# Patient Record
Sex: Female | Born: 1935 | Race: White | Hispanic: No | Smoking: Never smoker
Health system: Southern US, Community
[De-identification: ages and names within clinical notes are randomized; demographics above are authoritative.]

## PROBLEM LIST (undated history)

## (undated) DIAGNOSIS — M1990 Unspecified osteoarthritis, unspecified site: Secondary | ICD-10-CM

## (undated) DIAGNOSIS — Z9289 Personal history of other medical treatment: Secondary | ICD-10-CM

## (undated) DIAGNOSIS — K5730 Diverticulosis of large intestine without perforation or abscess without bleeding: Secondary | ICD-10-CM

## (undated) DIAGNOSIS — I219 Acute myocardial infarction, unspecified: Secondary | ICD-10-CM

## (undated) DIAGNOSIS — R42 Dizziness and giddiness: Secondary | ICD-10-CM

## (undated) DIAGNOSIS — J309 Allergic rhinitis, unspecified: Secondary | ICD-10-CM

## (undated) DIAGNOSIS — I2510 Atherosclerotic heart disease of native coronary artery without angina pectoris: Secondary | ICD-10-CM

## (undated) DIAGNOSIS — M899 Disorder of bone, unspecified: Secondary | ICD-10-CM

## (undated) DIAGNOSIS — M949 Disorder of cartilage, unspecified: Secondary | ICD-10-CM

## (undated) DIAGNOSIS — K219 Gastro-esophageal reflux disease without esophagitis: Secondary | ICD-10-CM

## (undated) DIAGNOSIS — N952 Postmenopausal atrophic vaginitis: Secondary | ICD-10-CM

## (undated) HISTORY — DX: Dizziness and giddiness: R42

## (undated) HISTORY — PX: HEMORRHOID SURGERY: SHX153

## (undated) HISTORY — DX: Allergic rhinitis, unspecified: J30.9

## (undated) HISTORY — DX: Disorder of cartilage, unspecified: M94.9

## (undated) HISTORY — PX: EYE SURGERY: SHX253

## (undated) HISTORY — DX: Postmenopausal atrophic vaginitis: N95.2

## (undated) HISTORY — DX: Disorder of bone, unspecified: M89.9

---

## 1969-10-20 DIAGNOSIS — Z9289 Personal history of other medical treatment: Secondary | ICD-10-CM

## 1969-10-20 HISTORY — DX: Personal history of other medical treatment: Z92.89

## 1969-10-20 HISTORY — PX: APPENDECTOMY: SHX54

## 1969-10-20 HISTORY — PX: TOTAL ABDOMINAL HYSTERECTOMY: SHX209

## 1998-10-16 ENCOUNTER — Encounter: Payer: Self-pay | Admitting: Radiology

## 1998-10-16 ENCOUNTER — Ambulatory Visit (HOSPITAL_COMMUNITY): Admission: RE | Admit: 1998-10-16 | Discharge: 1998-10-16 | Payer: Self-pay | Admitting: Ophthalmology

## 2000-01-28 ENCOUNTER — Encounter: Payer: Self-pay | Admitting: Internal Medicine

## 2000-01-28 ENCOUNTER — Encounter: Admission: RE | Admit: 2000-01-28 | Discharge: 2000-01-28 | Payer: Self-pay | Admitting: Internal Medicine

## 2001-02-03 ENCOUNTER — Encounter: Admission: RE | Admit: 2001-02-03 | Discharge: 2001-02-03 | Payer: Self-pay | Admitting: Internal Medicine

## 2001-02-03 ENCOUNTER — Encounter: Payer: Self-pay | Admitting: Internal Medicine

## 2001-10-20 DIAGNOSIS — K5730 Diverticulosis of large intestine without perforation or abscess without bleeding: Secondary | ICD-10-CM

## 2001-10-20 HISTORY — DX: Diverticulosis of large intestine without perforation or abscess without bleeding: K57.30

## 2002-02-08 ENCOUNTER — Encounter: Admission: RE | Admit: 2002-02-08 | Discharge: 2002-02-08 | Payer: Self-pay | Admitting: Internal Medicine

## 2002-02-08 ENCOUNTER — Encounter: Payer: Self-pay | Admitting: Internal Medicine

## 2002-07-08 ENCOUNTER — Encounter: Payer: Self-pay | Admitting: Internal Medicine

## 2003-02-21 ENCOUNTER — Encounter: Payer: Self-pay | Admitting: Internal Medicine

## 2003-02-21 ENCOUNTER — Encounter: Admission: RE | Admit: 2003-02-21 | Discharge: 2003-02-21 | Payer: Self-pay | Admitting: Internal Medicine

## 2003-03-06 ENCOUNTER — Encounter: Payer: Self-pay | Admitting: Internal Medicine

## 2003-03-06 ENCOUNTER — Encounter: Admission: RE | Admit: 2003-03-06 | Discharge: 2003-03-06 | Payer: Self-pay | Admitting: Internal Medicine

## 2004-02-23 ENCOUNTER — Ambulatory Visit (HOSPITAL_COMMUNITY): Admission: RE | Admit: 2004-02-23 | Discharge: 2004-02-23 | Payer: Self-pay | Admitting: Internal Medicine

## 2004-05-07 ENCOUNTER — Encounter: Admission: RE | Admit: 2004-05-07 | Discharge: 2004-05-07 | Payer: Self-pay | Admitting: Internal Medicine

## 2004-05-28 ENCOUNTER — Other Ambulatory Visit: Admission: RE | Admit: 2004-05-28 | Discharge: 2004-05-28 | Payer: Self-pay | Admitting: Obstetrics and Gynecology

## 2004-06-21 ENCOUNTER — Ambulatory Visit (HOSPITAL_COMMUNITY): Admission: RE | Admit: 2004-06-21 | Discharge: 2004-06-21 | Payer: Self-pay | Admitting: Obstetrics and Gynecology

## 2004-08-27 ENCOUNTER — Ambulatory Visit: Payer: Self-pay | Admitting: Internal Medicine

## 2004-11-06 ENCOUNTER — Ambulatory Visit: Payer: Self-pay | Admitting: Internal Medicine

## 2005-02-24 ENCOUNTER — Ambulatory Visit (HOSPITAL_COMMUNITY): Admission: RE | Admit: 2005-02-24 | Discharge: 2005-02-24 | Payer: Self-pay | Admitting: Internal Medicine

## 2005-08-21 ENCOUNTER — Ambulatory Visit: Payer: Self-pay | Admitting: Internal Medicine

## 2005-11-17 ENCOUNTER — Ambulatory Visit: Payer: Self-pay | Admitting: Internal Medicine

## 2005-11-19 ENCOUNTER — Ambulatory Visit: Payer: Self-pay | Admitting: Internal Medicine

## 2006-03-02 ENCOUNTER — Ambulatory Visit: Payer: Self-pay | Admitting: Internal Medicine

## 2006-04-01 ENCOUNTER — Ambulatory Visit (HOSPITAL_COMMUNITY): Admission: RE | Admit: 2006-04-01 | Discharge: 2006-04-01 | Payer: Self-pay | Admitting: Internal Medicine

## 2006-07-03 ENCOUNTER — Ambulatory Visit: Payer: Self-pay | Admitting: Internal Medicine

## 2006-08-12 ENCOUNTER — Ambulatory Visit: Payer: Self-pay | Admitting: Internal Medicine

## 2006-11-18 ENCOUNTER — Ambulatory Visit: Payer: Self-pay | Admitting: Internal Medicine

## 2007-03-03 ENCOUNTER — Ambulatory Visit: Payer: Self-pay | Admitting: Internal Medicine

## 2007-03-04 LAB — CONVERTED CEMR LAB
ALT: 16 units/L (ref 0–40)
AST: 21 units/L (ref 0–37)
Albumin: 3.9 g/dL (ref 3.5–5.2)
Alkaline Phosphatase: 56 units/L (ref 39–117)
Amylase: 79 units/L (ref 27–131)
BUN: 14 mg/dL (ref 6–23)
Basophils Absolute: 0.1 10*3/uL (ref 0.0–0.1)
Basophils Relative: 1 % (ref 0.0–1.0)
Bilirubin, Direct: 0.1 mg/dL (ref 0.0–0.3)
CO2: 28 meq/L (ref 19–32)
Calcium: 9.3 mg/dL (ref 8.4–10.5)
Chloride: 103 meq/L (ref 96–112)
Creatinine, Ser: 0.9 mg/dL (ref 0.4–1.2)
Eosinophils Absolute: 0.3 10*3/uL (ref 0.0–0.6)
Eosinophils Relative: 3.8 % (ref 0.0–5.0)
GFR calc Af Amer: 79 mL/min
GFR calc non Af Amer: 65 mL/min
Glucose, Bld: 98 mg/dL (ref 70–99)
HCT: 41.8 % (ref 36.0–46.0)
Hemoglobin: 14.1 g/dL (ref 12.0–15.0)
Lymphocytes Relative: 14.8 % (ref 12.0–46.0)
MCHC: 33.7 g/dL (ref 30.0–36.0)
MCV: 95.8 fL (ref 78.0–100.0)
Monocytes Absolute: 0.8 10*3/uL — ABNORMAL HIGH (ref 0.2–0.7)
Monocytes Relative: 10.2 % (ref 3.0–11.0)
Neutro Abs: 5.4 10*3/uL (ref 1.4–7.7)
Neutrophils Relative %: 70.2 % (ref 43.0–77.0)
Platelets: 246 10*3/uL (ref 150–400)
Potassium: 4.2 meq/L (ref 3.5–5.1)
RBC: 4.36 M/uL (ref 3.87–5.11)
RDW: 12.6 % (ref 11.5–14.6)
Sodium: 138 meq/L (ref 135–145)
Total Bilirubin: 1.1 mg/dL (ref 0.3–1.2)
Total Protein: 6.9 g/dL (ref 6.0–8.3)
WBC: 7.8 10*3/uL (ref 4.5–10.5)

## 2007-04-07 ENCOUNTER — Encounter: Payer: Self-pay | Admitting: Internal Medicine

## 2007-04-07 ENCOUNTER — Ambulatory Visit (HOSPITAL_COMMUNITY): Admission: RE | Admit: 2007-04-07 | Discharge: 2007-04-07 | Payer: Self-pay | Admitting: Internal Medicine

## 2007-06-02 DIAGNOSIS — M949 Disorder of cartilage, unspecified: Secondary | ICD-10-CM

## 2007-06-02 DIAGNOSIS — M899 Disorder of bone, unspecified: Secondary | ICD-10-CM

## 2007-06-02 DIAGNOSIS — J309 Allergic rhinitis, unspecified: Secondary | ICD-10-CM

## 2007-06-02 HISTORY — DX: Allergic rhinitis, unspecified: J30.9

## 2007-06-02 HISTORY — DX: Disorder of bone, unspecified: M89.9

## 2007-06-28 ENCOUNTER — Ambulatory Visit: Payer: Self-pay | Admitting: Gastroenterology

## 2007-07-12 ENCOUNTER — Encounter: Payer: Self-pay | Admitting: Internal Medicine

## 2007-07-12 ENCOUNTER — Ambulatory Visit: Payer: Self-pay | Admitting: Gastroenterology

## 2007-08-03 ENCOUNTER — Ambulatory Visit: Payer: Self-pay | Admitting: Internal Medicine

## 2007-08-23 ENCOUNTER — Ambulatory Visit: Payer: Self-pay | Admitting: Internal Medicine

## 2007-08-23 DIAGNOSIS — J069 Acute upper respiratory infection, unspecified: Secondary | ICD-10-CM | POA: Insufficient documentation

## 2007-11-25 ENCOUNTER — Ambulatory Visit: Payer: Self-pay | Admitting: Internal Medicine

## 2007-11-25 LAB — CONVERTED CEMR LAB
Alkaline Phosphatase: 50 units/L (ref 39–117)
CO2: 31 meq/L (ref 19–32)
Calcium: 9.6 mg/dL (ref 8.4–10.5)
Cholesterol: 236 mg/dL (ref 0–200)
Direct LDL: 139.2 mg/dL
Eosinophils Absolute: 0.2 10*3/uL (ref 0.0–0.6)
GFR calc non Af Amer: 65 mL/min
Glucose, Bld: 88 mg/dL (ref 70–99)
HCT: 42 % (ref 36.0–46.0)
Hemoglobin: 14.7 g/dL (ref 12.0–15.0)
Lymphocytes Relative: 34.9 % (ref 12.0–46.0)
MCHC: 34.9 g/dL (ref 30.0–36.0)
MCV: 94.4 fL (ref 78.0–100.0)
Monocytes Absolute: 0.7 10*3/uL (ref 0.2–0.7)
Monocytes Relative: 14 % — ABNORMAL HIGH (ref 3.0–11.0)
Neutro Abs: 2.4 10*3/uL (ref 1.4–7.7)
Platelets: 238 10*3/uL (ref 150–400)
Potassium: 4.8 meq/L (ref 3.5–5.1)
RBC: 4.45 M/uL (ref 3.87–5.11)
RDW: 12.8 % (ref 11.5–14.6)
TSH: 1.44 microintl units/mL (ref 0.35–5.50)
Total Bilirubin: 1.5 mg/dL — ABNORMAL HIGH (ref 0.3–1.2)
Total Protein: 6.4 g/dL (ref 6.0–8.3)
WBC: 5 10*3/uL (ref 4.5–10.5)

## 2007-11-26 ENCOUNTER — Telehealth: Payer: Self-pay | Admitting: Internal Medicine

## 2008-01-24 ENCOUNTER — Ambulatory Visit: Payer: Self-pay | Admitting: Internal Medicine

## 2008-01-24 DIAGNOSIS — N390 Urinary tract infection, site not specified: Secondary | ICD-10-CM | POA: Insufficient documentation

## 2008-01-24 LAB — CONVERTED CEMR LAB
Bilirubin Urine: NEGATIVE
Glucose, Urine, Semiquant: NEGATIVE

## 2008-03-08 ENCOUNTER — Ambulatory Visit: Payer: Self-pay | Admitting: Internal Medicine

## 2008-04-07 ENCOUNTER — Ambulatory Visit (HOSPITAL_COMMUNITY): Admission: RE | Admit: 2008-04-07 | Discharge: 2008-04-07 | Payer: Self-pay | Admitting: Obstetrics and Gynecology

## 2008-05-08 ENCOUNTER — Encounter: Admission: RE | Admit: 2008-05-08 | Discharge: 2008-05-08 | Payer: Self-pay | Admitting: Obstetrics and Gynecology

## 2008-07-20 ENCOUNTER — Ambulatory Visit: Payer: Self-pay | Admitting: Internal Medicine

## 2008-07-25 ENCOUNTER — Ambulatory Visit: Payer: Self-pay | Admitting: Internal Medicine

## 2008-09-04 ENCOUNTER — Emergency Department (HOSPITAL_COMMUNITY): Admission: EM | Admit: 2008-09-04 | Discharge: 2008-09-04 | Payer: Self-pay | Admitting: Emergency Medicine

## 2008-09-28 ENCOUNTER — Emergency Department (HOSPITAL_COMMUNITY): Admission: EM | Admit: 2008-09-28 | Discharge: 2008-09-29 | Payer: Self-pay | Admitting: Emergency Medicine

## 2008-09-29 ENCOUNTER — Telehealth: Payer: Self-pay | Admitting: Internal Medicine

## 2008-10-16 ENCOUNTER — Telehealth: Payer: Self-pay | Admitting: Internal Medicine

## 2008-12-05 ENCOUNTER — Encounter: Payer: Self-pay | Admitting: Internal Medicine

## 2008-12-06 ENCOUNTER — Ambulatory Visit: Payer: Self-pay | Admitting: Internal Medicine

## 2008-12-06 DIAGNOSIS — N952 Postmenopausal atrophic vaginitis: Secondary | ICD-10-CM | POA: Insufficient documentation

## 2008-12-06 HISTORY — DX: Postmenopausal atrophic vaginitis: N95.2

## 2008-12-06 LAB — CONVERTED CEMR LAB
Basophils Absolute: 0 10*3/uL (ref 0.0–0.1)
Basophils Relative: 0.1 % (ref 0.0–3.0)
Bilirubin, Direct: 0.2 mg/dL (ref 0.0–0.3)
Cholesterol: 260 mg/dL (ref 0–200)
Eosinophils Absolute: 0.2 10*3/uL (ref 0.0–0.7)
Eosinophils Relative: 3.4 % (ref 0.0–5.0)
GFR calc Af Amer: 90 mL/min
HDL: 69.2 mg/dL (ref >39.0)
MCHC: 34.6 g/dL (ref 30.0–36.0)
MCV: 95 fL (ref 78.0–100.0)
Monocytes Absolute: 0.5 10*3/uL (ref 0.1–1.0)
Monocytes Relative: 10.4 % (ref 3.0–12.0)
Neutro Abs: 2.5 10*3/uL (ref 1.4–7.7)
Neutrophils Relative %: 49.2 % (ref 43.0–77.0)
RBC: 4.33 M/uL (ref 3.87–5.11)
RDW: 12.7 % (ref 11.5–14.6)
Total Bilirubin: 1.7 mg/dL — ABNORMAL HIGH (ref 0.3–1.2)
Total CHOL/HDL Ratio: 3.8
Triglycerides: 142 mg/dL (ref 0–149)
VLDL: 28 mg/dL (ref 0–40)
WBC: 5 10*3/uL (ref 4.5–10.5)

## 2009-02-15 ENCOUNTER — Telehealth: Payer: Self-pay | Admitting: Internal Medicine

## 2009-02-16 ENCOUNTER — Ambulatory Visit: Payer: Self-pay | Admitting: Family Medicine

## 2009-02-16 DIAGNOSIS — R42 Dizziness and giddiness: Secondary | ICD-10-CM | POA: Insufficient documentation

## 2009-02-16 HISTORY — DX: Dizziness and giddiness: R42

## 2009-05-09 ENCOUNTER — Ambulatory Visit (HOSPITAL_COMMUNITY): Admission: RE | Admit: 2009-05-09 | Discharge: 2009-05-09 | Payer: Self-pay | Admitting: Obstetrics and Gynecology

## 2009-12-12 ENCOUNTER — Ambulatory Visit: Payer: Self-pay | Admitting: Internal Medicine

## 2010-01-17 ENCOUNTER — Telehealth: Payer: Self-pay | Admitting: Internal Medicine

## 2010-04-20 ENCOUNTER — Emergency Department (HOSPITAL_COMMUNITY): Admission: EM | Admit: 2010-04-20 | Discharge: 2010-04-20 | Payer: Self-pay | Admitting: Emergency Medicine

## 2010-04-23 ENCOUNTER — Telehealth: Payer: Self-pay | Admitting: Family Medicine

## 2010-05-09 ENCOUNTER — Telehealth: Payer: Self-pay | Admitting: Internal Medicine

## 2010-05-15 ENCOUNTER — Ambulatory Visit (HOSPITAL_COMMUNITY): Admission: RE | Admit: 2010-05-15 | Discharge: 2010-05-15 | Payer: Self-pay | Admitting: Internal Medicine

## 2010-05-15 LAB — HM MAMMOGRAPHY

## 2010-06-13 ENCOUNTER — Ambulatory Visit: Payer: Self-pay | Admitting: Internal Medicine

## 2010-06-13 DIAGNOSIS — R300 Dysuria: Secondary | ICD-10-CM | POA: Insufficient documentation

## 2010-06-13 LAB — CONVERTED CEMR LAB
Glucose, Urine, Semiquant: NEGATIVE
Ketones, urine, test strip: NEGATIVE
Protein, U semiquant: NEGATIVE

## 2010-08-05 ENCOUNTER — Encounter: Payer: Self-pay | Admitting: Internal Medicine

## 2010-09-30 ENCOUNTER — Telehealth: Payer: Self-pay | Admitting: Internal Medicine

## 2010-11-17 LAB — CONVERTED CEMR LAB
ALT: 18 units/L (ref 0–40)
AST: 22 units/L (ref 0–37)
Albumin: 4.2 g/dL (ref 3.5–5.2)
BUN: 24 mg/dL — ABNORMAL HIGH (ref 6–23)
Basophils Absolute: 0 10*3/uL (ref 0.0–0.1)
Basophils Absolute: 0 10*3/uL (ref 0.0–0.1)
Bilirubin, Direct: 0.1 mg/dL (ref 0.0–0.3)
Calcium: 10 mg/dL (ref 8.4–10.5)
Calcium: 9.2 mg/dL (ref 8.4–10.5)
Chloride: 106 meq/L (ref 96–112)
Chloride: 106 meq/L (ref 96–112)
Cholesterol: 228 mg/dL — ABNORMAL HIGH (ref 0–200)
Creatinine, Ser: 1 mg/dL (ref 0.4–1.2)
Direct LDL: 113.8 mg/dL
Direct LDL: 144.6 mg/dL
Eosinophils Relative: 3.2 % (ref 0.0–5.0)
GFR calc Af Amer: 63 mL/min
GFR calc non Af Amer: 52 mL/min
GFR calc non Af Amer: 57.39 mL/min (ref >60)
Glucose, Bld: 88 mg/dL (ref 70–99)
Glucose, Bld: 96 mg/dL (ref 70–99)
HCT: 42.2 % (ref 36.0–46.0)
HCT: 42.7 % (ref 36.0–46.0)
HDL: 75.8 mg/dL (ref >39.0)
Hemoglobin: 14.9 g/dL (ref 12.0–15.0)
Lymphs Abs: 1.3 10*3/uL (ref 0.7–4.0)
MCHC: 33.2 g/dL (ref 30.0–36.0)
MCHC: 34.8 g/dL (ref 30.0–36.0)
MCV: 98.8 fL (ref 78.0–100.0)
Monocytes Absolute: 0.5 10*3/uL (ref 0.1–1.0)
Monocytes Relative: 11.6 % — ABNORMAL HIGH (ref 3.0–11.0)
Neutro Abs: 2.5 10*3/uL (ref 1.4–7.7)
Neutrophils Relative %: 51.7 % (ref 43.0–77.0)
Platelets: 224 10*3/uL (ref 150.0–400.0)
Potassium: 4.6 meq/L (ref 3.5–5.1)
Potassium: 5.4 meq/L — ABNORMAL HIGH (ref 3.5–5.1)
RBC: 4.27 M/uL (ref 3.87–5.11)
RDW: 12.9 % (ref 11.5–14.6)
TSH: 0.89 microintl units/mL (ref 0.35–5.50)
Total Bilirubin: 1.5 mg/dL — ABNORMAL HIGH (ref 0.3–1.2)
Total CHOL/HDL Ratio: 3
Total Protein: 7 g/dL (ref 6.0–8.3)
Triglycerides: 104 mg/dL (ref 0.0–149.0)
Triglycerides: 117 mg/dL (ref 0–149)
VLDL: 20.8 mg/dL (ref 0.0–40.0)
VLDL: 23 mg/dL (ref 0–40)
WBC: 4 10*3/uL — ABNORMAL LOW (ref 4.5–10.5)

## 2010-11-19 NOTE — Progress Notes (Signed)
Summary: Call-A-Nurse Report    Call-A-Nurse Triage Call Report Triage Record Num: [id number] Operator: Geanie Berlin Patient Name: Nancy Neal Call Date & Time: 04/20/2010 9:13:56AM Patient Phone: [phone] PCP: Gordy Savers Patient Gender: Female PCP Fax : [phone] Patient DOB: Oct 03, 1932 [facility] Name[facility] Reason for Call: 77 yo Calling re itchy red rash that appears as round circle & becomes swollen like a mosquito bite on back since 04/19/10, now spread to F eye, cheek & thigh. Afebrile. Spots on face on pin hole like insect bite. Advised to see UC now for rash involves eye lid per Rash/ Hives/Eruptions Guideline. Protocol(s) Used: Rash / Hives / Eruptions Recommended Outcome per Protocol: See Provider within 4 hours Reason for Outcome: Rash involves eye, eye lid, or tip of nose Care Advice:  ~ Another adult should drive. Call provider if symptoms get worse, or you develop increasing eye pain, changes in vision, or blisters or sores on eye or insides of eyelids.  ~  ~ List, or take, all current prescription(s), OTC or alternative medication(s) to provider for evaluation. Antiviral medication may be prescribed by provider to limit symptoms but it should be started within 48 hours of the onset of the symptoms.  ~  ~ Apply cool compresses for 15 to 20 minutes 4 to 6 times a day to relieve discomfort. For symptom relief, consider OTC antihistamines (such as Allerest, Chlor-Trimetron, Benadryl, etc.) as directed on label or by pharmacist. Drowsiness may result, especially in geriatric patients. Claritin is a non-sedating antihistamine now available OTC.  ~  ~ SYMPTOM / CONDITION MANAGEMENT  ~ IMMEDIATE ACTION 04/20/2010 9:28:21AM Page 1 of 1 CAN_TriageRpt_V2

## 2010-11-19 NOTE — Miscellaneous (Signed)
Summary: flu vaccine   Clinical Lists Changes  Observations: Added new observation of FLU VAX: Historical (08/05/2010 13:49)      Immunization History:  Influenza Immunization History:    Influenza:  Historical (08/05/2010)  Zostavax History:    Zostavax # 1:  Zostavax (07/25/2008) given at [facility]   KIK

## 2010-11-19 NOTE — Progress Notes (Signed)
Summary: bladder infection  Phone Note Call from Patient   Summary of Call: patient is calling because she has a bladder infection. she state that she  has a history of bladder infections.  patient is now in [place] and the pharmacy number is [phone] also her cell number is [phone] Initial call taken by: Kern Reap CMA Duncan Dull),  May 09, 2010 2:26 PM  Follow-up for Phone Call        pt is aware doc has not responded to message yet Follow-up by: Heron Sabins,  May 09, 2010 4:54 PM  Additional Follow-up for Phone Call Additional follow up Details #1::        generic cipro 500 #14 one twice daily Additional Follow-up by: Gordy Savers  MD,  May 09, 2010 5:25 PM    Additional Follow-up for Phone Call Additional follow up Details #2::    rx called in patient is aware Follow-up by: Kern Reap CMA Duncan Dull),  May 09, 2010 5:35 PM

## 2010-11-19 NOTE — Assessment & Plan Note (Signed)
Summary: EMP/PT COMING IN FASTING/JLS   Vital Signs:  Patient profile:   77 year old female Height:      65 inches Weight:      143 pounds BMI:     23.88 Temp:     97.5 degrees F oral BP sitting:   122 / 70  (right arm) Cuff size:   regular  Vitals Entered By: Duard Brady LPN (December 12, 2009 8:57 AM) CC: cpx - doing well , c.o jaw discomfort - dental appt in 2 weeks , c/o increased gas - using otc probiotic and helping   , needs refill  Is Patient Diabetic? No   CC:  cpx - doing well , c.o jaw discomfort - dental appt in 2 weeks , c/o increased gas - using otc probiotic and helping   , and needs refill .  History of Present Illness: 21 -year-old patient seen today for a comprehensive evaluation through she has a history of menopausal syndrome and is followed by gynecology for atrophic vaginitis.  She has osteopenia and a history of allergic rhinitis.  She was last seen spring of last year due to vertigo.  She has a history of occasional urinary tract infections.  She has a history of lactose intolerance, which is well-controlled with avoidance of dairy products.  She had a gynecologic examination in August of last year.  She is scheduled for a dental exam in two weeks. She remains quite active with participation at a local health club 4 times weekly.  Denies any cardiopulmonary complaints.  She is on calcium and vitamin supplementation  Preventive Screening-Counseling & Management  Alcohol-Tobacco     Smoking Status: never  Caffeine-Diet-Exercise     Does Patient Exercise: yes  Allergies: 1)  ! Duratuss Ac 12 2)  ! Codeine Sulfate (Codeine Sulfate)  Past History:  Past Medical History: Allergic rhinitis Osteopenia atrophic vaginitis menopausal syndrome lactose intolerance  Past Surgical History: Reviewed history from 12/06/2008 and no changes required. Hysterectomy  age 77 colonoscopy January 2009  Family History: Reviewed history from 12/06/2008 and no  changes required. father died age 107, complications of cirrhosis, coronary artery disease mother died of suicide death age 11  One brother history of osteoarthritis two sisters are well  uncle  diabetes, one aunt, status post abdominal aneurysm repair  Social History: Reviewed history from 12/06/2008 and no changes required. widowed  nonsmoker two children-3 grandchildren Regular exercise-yes Does Patient Exercise:  yes  Review of Systems  The patient denies anorexia, fever, weight loss, weight gain, vision loss, decreased hearing, hoarseness, chest pain, syncope, dyspnea on exertion, peripheral edema, prolonged cough, headaches, hemoptysis, abdominal pain, melena, hematochezia, severe indigestion/heartburn, hematuria, incontinence, genital sores, muscle weakness, suspicious skin lesions, transient blindness, difficulty walking, depression, unusual weight change, abnormal bleeding, enlarged lymph nodes, angioedema, and breast masses.    Physical Exam  General:  Well-developed,well-nourished,in no acute distress; alert,appropriate and cooperative throughout examination Head:  Normocephalic and atraumatic without obvious abnormalities. No apparent alopecia or balding. Eyes:  No corneal or conjunctival inflammation noted. EOMI. Perrla. Funduscopic exam benign, without hemorrhages, exudates or papilledema. Vision grossly normal. Ears:  External ear exam shows no significant lesions or deformities.  Otoscopic examination reveals clear canals, tympanic membranes are intact bilaterally without bulging, retraction, inflammation or discharge. Hearing is grossly normal bilaterally. Nose:  External nasal examination shows no deformity or inflammation. Nasal mucosa are pink and moist without lesions or exudates. Mouth:  Oral mucosa and oropharynx without lesions or exudates.  Teeth  in good repair. Neck:  No deformities, masses, or tenderness noted. Chest Wall:  No deformities, masses, or tenderness  noted. Breasts:  No mass, nodules, thickening, tenderness, bulging, retraction, inflamation, nipple discharge or skin changes noted.   Lungs:  Normal respiratory effort, chest expands symmetrically. Lungs are clear to auscultation, no crackles or wheezes. Heart:  Normal rate and regular rhythm. S1 and S2 normal without gallop, murmur, click, rub or other extra sounds. Abdomen:  Bowel sounds positive,abdomen soft and non-tender without masses, organomegaly or hernias noted. Msk:  No deformity or scoliosis noted of thoracic or lumbar spine.   Pulses:  R and L carotid,radial,femoral,dorsalis pedis and posterior tibial pulses are full and equal bilaterally Extremities:  No clubbing, cyanosis, edema, or deformity noted with normal full range of motion of all joints.   Neurologic:  No cranial nerve deficits noted. Station and gait are normal. Plantar reflexes are down-going bilaterally. DTRs are symmetrical throughout. Sensory, motor and coordinative functions appear intact. Skin:  Intact without suspicious lesions or rashes Cervical Nodes:  No lymphadenopathy noted Axillary Nodes:  No palpable lymphadenopathy Inguinal Nodes:  No significant adenopathy Psych:  Cognition and judgment appear intact. Alert and cooperative with normal attention span and concentration. No apparent delusions, illusions, hallucinations   Impression & Recommendations:  Problem # 1:  OSTEOPENIA (ICD-733.90)  Problem # 2:  VAGINITIS, ATROPHIC, POSTMENOPAUSAL (ICD-627.3)  Her updated medication list for this problem includes:    Premarin 0.625 Mg/gm Crea (Estrogens, conjugated) .Marland Kitchen... As needed    Her updated medication list for this problem includes:    Premarin 0.625 Mg/gm Crea (Estrogens, conjugated) .Marland Kitchen... As needed  Problem # 3:  ALLERGIC RHINITIS (ICD-477.9)  Her updated medication list for this problem includes:    Allegra 60 Mg Tabs (Fexofenadine hcl) .Marland Kitchen... As needed    Fluticasone Propionate 50 Mcg/act Susp  (Fluticasone propionate)    Her updated medication list for this problem includes:    Allegra 60 Mg Tabs (Fexofenadine hcl) .Marland Kitchen... As needed    Fluticasone Propionate 50 Mcg/act Susp (Fluticasone propionate)  Complete Medication List: 1)  Allegra 60 Mg Tabs (Fexofenadine hcl) .... As needed 2)  Multivitamins Caps (Multiple vitamin) .... Once daily 3)  Calcium 500 500 Mg Tabs (Calcium carbonate) .... Once daily 4)  Premarin 0.625 Mg/gm Crea (Estrogens, conjugated) .... As needed 5)  Fluticasone Propionate 50 Mcg/act Susp (Fluticasone propionate) 6)  Meclizine Hcl 25 Mg Tabs (Meclizine hcl) .... One by mouth q 4-6 hours for dizziness  Other Orders: EKG w/ Interpretation (93000) Venipuncture (78295) TLB-Lipid Panel (80061-LIPID) TLB-BMP (Basic Metabolic Panel-BMET) (80048-METABOL) TLB-CBC Platelet - w/Differential (85025-CBCD) TLB-Hepatic/Liver Function Pnl (80076-HEPATIC) TLB-TSH (Thyroid Stimulating Hormone) (62130-QMV)  Patient Instructions: 1)  Please schedule a follow-up appointment in 1 year. 2)  It is important that you exercise regularly at least 20 minutes 5 times a week. If you develop chest pain, have severe difficulty breathing, or feel very tired , stop exercising immediately and seek medical attention. 3)  Schedule your mammogram. 4)  Take calcium +Vitamin D daily. Prescriptions: FLUTICASONE PROPIONATE 50 MCG/ACT  SUSP (FLUTICASONE PROPIONATE)   #3 x 6   Entered and Authorized by:   Gordy Savers  MD   Signed by:   Gordy Savers  MD on 12/12/2009   Method used:   Print then Give to Patient   RxID:   [id number] ALLEGRA 60 MG  TABS (FEXOFENADINE HCL) as needed  #180 x 6   Entered and Authorized by:   Theron Arista  Lysle Dingwall  MD   Signed by:   Gordy Savers  MD on 12/12/2009   Method used:   Print then Give to Patient   RxID:   [id number]

## 2010-11-19 NOTE — Progress Notes (Signed)
Summary: sinus  Phone Note Call from Patient Call back at [phone] 1st or [phone]   Summary of Call: Requesting med for Sinus infection.  Congestion, headache, raw sore throat, sneezing, watery eyes, history of this in spring.  Was taking Allegra & switched to [employer].   Out of town.  Will come in later when she's home prn.  [facility] [phone].  NKDA.   Wants a call back.   Initial call taken by: Rudy Jew, RN,  January 17, 2010 8:45 AM  Follow-up for Phone Call        continue sudafed and resume allgra; fluticasone nasal one puff each nares daily Follow-up by: Gordy Savers  MD,  January 17, 2010 10:18 AM  Additional Follow-up for Phone Call Additional follow up Details #1::        Phone Call Completed Additional Follow-up by: Rudy Jew, RN,  January 17, 2010 11:30 AM    New/Updated Medications: FLUTICASONE PROPIONATE 50 MCG/ACT  SUSP (FLUTICASONE PROPIONATE)

## 2010-11-19 NOTE — Miscellaneous (Signed)
Summary: Flu Shot/[facility]  Flu Shot/[facility]   Imported By: Maryln Gottron 08/06/2010 13:51:22  _____________________________________________________________________  External Attachment:    Type:   Image     Comment:   External Document

## 2010-11-19 NOTE — Assessment & Plan Note (Signed)
Summary: UTI? // RS   Vital Signs:  Patient profile:   77 year old female Weight:      137 pounds Temp:     97.6 degrees F oral BP sitting:   112 / 80  (left arm) Cuff size:   regular  Vitals Entered By: Duard Brady LPN (June 13, 2010 9:59 AM) CC: UTI, some burning, src Is Patient Diabetic? No   CC:  UTI, some burning, and src.  History of Present Illness: 77 year old patient who presents with a two to 3 day history of burning, dysuria and frequency.  She has been using some OTC Azo.  she was seen in the ED last month due to a cellulitis and was treated with cephalexin.  No other concerns or complaints.  No abdominal or flank pain, fever or chills.  Current Medications (verified): 1)  Allegra 60 Mg  Tabs (Fexofenadine Hcl) .... As Needed 2)  Multivitamins   Caps (Multiple Vitamin) .... Once Daily 3)  Calcium 500 500 Mg  Tabs (Calcium Carbonate) .... Once Daily 4)  Premarin 0.625 Mg/gm  Crea (Estrogens, Conjugated) .... As Needed 5)  Fluticasone Propionate 50 Mcg/act  Susp (Fluticasone Propionate) 6)  Meclizine Hcl 25 Mg Tabs (Meclizine Hcl) .... One By Mouth Q 4-6 Hours For Dizziness  Allergies (verified): 1)  ! Duratuss Ac 12 2)  ! Codeine Sulfate (Codeine Sulfate)  Past History:  Past Medical History: Reviewed history from 12/12/2009 and no changes required. Allergic rhinitis Osteopenia atrophic vaginitis menopausal syndrome lactose intolerance  Past Surgical History: Reviewed history from 12/06/2008 and no changes required. Hysterectomy  age 45 colonoscopy January 2009  Physical Exam  General:  Well-developed,well-nourished,in no acute distress; alert,appropriate and cooperative throughout examination Abdomen:  Bowel sounds positive,abdomen soft and non-tender without masses, organomegaly or hernias noted.   Impression & Recommendations:  Problem # 1:  UTI (ICD-599.0)  Her updated medication list for this problem includes:    Ciprofloxacin Hcl  500 Mg Tabs (Ciprofloxacin hcl) ..... One twice daily  Her updated medication list for this problem includes:    Ciprofloxacin Hcl 500 Mg Tabs (Ciprofloxacin hcl) ..... One twice daily  Complete Medication List: 1)  Allegra 60 Mg Tabs (Fexofenadine hcl) .... As needed 2)  Multivitamins Caps (Multiple vitamin) .... Once daily 3)  Calcium 500 500 Mg Tabs (Calcium carbonate) .... Once daily 4)  Premarin 0.625 Mg/gm Crea (Estrogens, conjugated) .... As needed 5)  Fluticasone Propionate 50 Mcg/act Susp (Fluticasone propionate) 6)  Meclizine Hcl 25 Mg Tabs (Meclizine hcl) .... One by mouth q 4-6 hours for dizziness 7)  Ciprofloxacin Hcl 500 Mg Tabs (Ciprofloxacin hcl) .... One twice daily  Other Orders: UA Dipstick w/o Micro (manual) (13086)  Patient Instructions: 1)  Drink as much fluid as you can tolerate for the next few days. 2)  Take your antibiotic as prescribed until ALL of it is gone, but stop if you develop a rash or swelling and contact our office as soon as possible. Prescriptions: CIPROFLOXACIN HCL 500 MG TABS (CIPROFLOXACIN HCL) one twice daily  #10 x 0   Entered and Authorized by:   Gordy Savers  MD   Signed by:   Gordy Savers  MD on 06/13/2010   Method used:   Print then Give to Patient   RxID:   [id number]   Laboratory Results   Urine Tests  Date/Time Received: June 13, 2010 10:16 AM  Date/Time Reported: June 13, 2010 10:16 AM 00  Routine Urinalysis  Color: yellow Appearance: Clear Glucose: negative   (Normal Range: Negative) Bilirubin: negative   (Normal Range: Negative) Ketone: negative   (Normal Range: Negative) Spec. Gravity: 1.015   (Normal Range: 1.003-1.035) Blood: negative   (Normal Range: Negative) pH: 5.0   (Normal Range: 5.0-8.0) Protein: negative   (Normal Range: Negative) Urobilinogen: 0.2   (Normal Range: 0-1) Nitrite: negative   (Normal Range: Negative) Leukocyte Esterace: negative   (Normal Range: Negative)

## 2010-11-21 NOTE — Progress Notes (Signed)
Summary: Call A Nurse   Call-A-Nurse Triage Call Report Triage Record Num: [id number] Operator: Caswell Corwin Patient Name: Nancy Neal Call Date & Time: 09/28/2010 5:16:21PM Patient Phone: [phone] PCP: Sonda Primes Patient Gender: Female PCP Fax : [phone] Patient DOB: 06-03-1932 [facility] Name[facility] Reason for Call: Pt calling that she is having burning and pain with urination. Has a hx of bladder infection. Sx started today. Last voided at 1419. Triaged urinary sx female and home care and call back inst given. Inst will have to go to [facility] for eval. Protocol(s) Used: Urinary Symptoms - Female Recommended Outcome per Protocol: See Provider within 24 hours Reason for Outcome: Has one or more urinary tract symptoms Care Advice: Increase intake of fluids. Try to drink 8 oz. (.2 liter) every hour when awake, including unsweetened cranberry juice, unless on restricted fluids for other medical reasons. Take sips of fluid or eat ice chips if nauseated or vomiting.  ~ Analgesic/Antipyretic Advice - Acetaminophen: Consider acetaminophen as directed on label or by pharmacist/provider for pain or fever PRECAUTIONS: - Use if there is no history of liver disease, alcoholism, or intake of three or more alcohol drinks per day - Only if approved by provider during pregnancy or when breastfeeding - During pregnancy, acetaminophen should not be taken more than 3 consecutive days without telling provider - Do not exceed recommended dose or frequency  ~ Analgesic/Antipyretic Advice - NSAIDs: Consider aspirin, ibuprofen, naproxen or ketoprofen for pain or fever as directed on label or by pharmacist/provider. PRECAUTIONS: - If over 73 years of age, should not take longer than 1 week without consulting provider. EXCEPTIONS: - Should not be used if taking blood thinners or have bleeding problems. - Do not use if have history of sensitivity/allergy to any of these medications; or  history of cardiovascular, ulcer, kidney, liver disease or diabetes unless approved by provider. - Do not exceed recommended dose or frequency.  ~ 09/28/2010 5:22:59PM Page 1 of 1 CAN_TriageRpt_V2

## 2010-12-15 ENCOUNTER — Encounter: Payer: Self-pay | Admitting: Internal Medicine

## 2010-12-16 ENCOUNTER — Ambulatory Visit (INDEPENDENT_AMBULATORY_CARE_PROVIDER_SITE_OTHER): Payer: Medicare Other | Admitting: Internal Medicine

## 2010-12-16 ENCOUNTER — Encounter: Payer: Self-pay | Admitting: Internal Medicine

## 2010-12-16 VITALS — BP 130/70 | HR 82 | Temp 97.7°F | Resp 14 | Ht 65.0 in | Wt 139.0 lb

## 2010-12-16 DIAGNOSIS — M949 Disorder of cartilage, unspecified: Secondary | ICD-10-CM

## 2010-12-16 DIAGNOSIS — M8580 Other specified disorders of bone density and structure, unspecified site: Secondary | ICD-10-CM

## 2010-12-16 DIAGNOSIS — Z0000 Encounter for general adult medical examination without abnormal findings: Secondary | ICD-10-CM

## 2010-12-16 DIAGNOSIS — M899 Disorder of bone, unspecified: Secondary | ICD-10-CM

## 2010-12-16 DIAGNOSIS — J309 Allergic rhinitis, unspecified: Secondary | ICD-10-CM

## 2010-12-16 MED ORDER — FLUTICASONE PROPIONATE 50 MCG/ACT NA SUSP: 1.0000 | Freq: Every day | NASAL | Status: AC

## 2010-12-16 MED ORDER — FLUTICASONE PROPIONATE 50 MCG/ACT NA SUSP: 1.0000 | Freq: Every day | NASAL | Status: DC

## 2010-12-16 NOTE — Patient Instructions (Signed)
It is important that you exercise regularly, at least 20 minutes 3 to 4 times per week.  If you develop chest pain or shortness of breath seek  medical attention.  Take a calcium supplement, plus 800-1200 units of vitamin D  Return in one year for follow-up   

## 2010-12-16 NOTE — Progress Notes (Signed)
Subjective:    Patient ID: Nancy Neal, female    DOB: August 02, 1932, 77 y.o.   MRN: [id number]  HPI 77 year old patient who is seen today for a health maintenance examination. She has a history of allergic rhinitis which has been quite bothersome. She has had recent eye surgery for glaucoma. She did have a colonoscopy in January of 2009 she has a history of osteopenia and is on calcium and vitamin D supplements.    1. Risk factors, based on past  M,S,F history-  No significant cardiovascular risk factors  2.  Physical activities: remains quite active goes to a health club 4 times weekly  3.  Depression/mood: no history depression or mood disorder  4.  Hearing: no hearing deficits 5.  ADL's: independent in all aspects of daily living  6.  Fall risk: low 7.  Home safety: no problems identified 8.  Height weight, and visual acuity; height and weight stable no difficulty with visual acuity  9.  Counseling: calcium and vitamin D supplementation as well as exercise all encouraged annual mammograms encouraged 10. Lab orders based on risk factors: laboratory profile including TSH will be reviewed 11. Referral coronation:  12. Care plan: continue heart healthy diet regular exercise  13. Cognitive assessment:  Alert and oriented with normal affect no cognitive dysfunction       Review of Systems  Constitutional: Negative for fever, appetite change, fatigue and unexpected weight change.  HENT: Negative for hearing loss, ear pain, nosebleeds, congestion, sore throat, mouth sores, trouble swallowing, neck stiffness, dental problem, voice change, sinus pressure and tinnitus.   Eyes: Negative for photophobia, pain, redness and visual disturbance.  Respiratory: Negative for cough, chest tightness and shortness of breath.   Cardiovascular: Negative for chest pain, palpitations and leg swelling.  Gastrointestinal: Negative for nausea, vomiting, abdominal pain, diarrhea, constipation, blood in  stool, abdominal distention and rectal pain.  Genitourinary: Negative for dysuria, urgency, frequency, hematuria, flank pain, vaginal bleeding, vaginal discharge, difficulty urinating, genital sores, vaginal pain, menstrual problem and pelvic pain.  Musculoskeletal: Negative for back pain and arthralgias.  Skin: Negative for rash.  Neurological: Negative for dizziness, syncope, speech difficulty, weakness, light-headedness, numbness and headaches.  Hematological: Negative for adenopathy. Does not bruise/bleed easily.  Psychiatric/Behavioral: Negative for suicidal ideas, behavioral problems, self-injury, dysphoric mood and agitation. The patient is not nervous/anxious.        Objective:   Physical Exam  Constitutional: She is oriented to person, place, and time. She appears well-developed and well-nourished.  HENT:  Head: Normocephalic and atraumatic.  Right Ear: External ear normal.  Left Ear: External ear normal.  Mouth/Throat: Oropharynx is clear and moist.  Eyes: Conjunctivae and EOM are normal.  Neck: Normal range of motion. Neck supple. No JVD present. No thyromegaly present.  Cardiovascular: Normal rate, regular rhythm, normal heart sounds and intact distal pulses.   No murmur heard. Pulmonary/Chest: Effort normal and breath sounds normal. She has no wheezes. She has no rales.  Abdominal: Soft. Bowel sounds are normal. She exhibits no distension and no mass. There is no tenderness. There is no rebound and no guarding.  Musculoskeletal: Normal range of motion. She exhibits no edema and no tenderness.  Neurological: She is alert and oriented to person, place, and time. She has normal reflexes. No cranial nerve deficit. She exhibits normal muscle tone. Coordination normal.  Skin: Skin is warm and dry. No rash noted.  Psychiatric: She has a normal mood and affect. Her behavior is normal.  Assessment & Plan:   unremarkable preventive health examination  Allergic rhinitis.  We'll give a trial of fluticasone. She will check with her ophthalmologist prior to its use in view of her history of glaucoma  And recent surgery    Osteopenia

## 2010-12-24 ENCOUNTER — Telehealth: Payer: Self-pay | Admitting: Internal Medicine

## 2010-12-24 ENCOUNTER — Encounter: Payer: Self-pay | Admitting: Internal Medicine

## 2010-12-24 NOTE — Telephone Encounter (Signed)
Pt has a history vertigo. Pt is requesting something to be call into [facility] [address] [phone]. Pt decline ov.

## 2010-12-24 NOTE — Telephone Encounter (Signed)
Meclizine 25 mg  #50 one every 4-6 hours prn vertigo

## 2010-12-24 NOTE — Telephone Encounter (Signed)
noted 

## 2010-12-24 NOTE — Telephone Encounter (Signed)
Pt called back and is coming in for ov tomorrow 12/25/10 at 11:15am. Pt said to disregard medication request. Pt was dx with Glaucoma and can not take Meclizne.  Pt will come in tomorrow.

## 2010-12-25 ENCOUNTER — Encounter: Payer: Self-pay | Admitting: Internal Medicine

## 2010-12-25 ENCOUNTER — Ambulatory Visit: Payer: Medicare Other | Admitting: Internal Medicine

## 2010-12-25 ENCOUNTER — Ambulatory Visit (INDEPENDENT_AMBULATORY_CARE_PROVIDER_SITE_OTHER): Payer: Medicare Other | Admitting: Internal Medicine

## 2010-12-25 DIAGNOSIS — R42 Dizziness and giddiness: Secondary | ICD-10-CM

## 2010-12-25 DIAGNOSIS — J309 Allergic rhinitis, unspecified: Secondary | ICD-10-CM

## 2010-12-26 ENCOUNTER — Encounter: Payer: Self-pay | Admitting: Internal Medicine

## 2010-12-26 NOTE — Patient Instructions (Signed)
Call or return to clinic prn if these symptoms worsen or fail to improve as anticipated.

## 2010-12-26 NOTE — Progress Notes (Signed)
  Subjective:    Patient ID: Nancy Neal, female    DOB: [birth year], 77 y.o.   MRN: [id number]  HPI 77 year old patient who is in today complaining of vertigo. She has had some significant vertigo in the past but for the  Past few days has had some mild positional vertigo described as a sensation of being mildly off balance. There's been no significant vertiginous symptoms or nausea. She has a history of controlled glaucoma and was concerned about taking antihistamines. Symptoms are quite mild. She does have a history of allergic rhinitis but no URI symptoms. Today she feels improved    Review of Systems  Constitutional: Negative.   HENT: Negative for hearing loss, congestion, sore throat, rhinorrhea, dental problem, sinus pressure and tinnitus.   Eyes: Negative for pain, discharge and visual disturbance.  Respiratory: Negative for cough and shortness of breath.   Cardiovascular: Negative for chest pain, palpitations and leg swelling.  Gastrointestinal: Negative for nausea, vomiting, abdominal pain, diarrhea, constipation, blood in stool and abdominal distention.  Genitourinary: Negative for dysuria, urgency, frequency, hematuria, flank pain, vaginal bleeding, vaginal discharge, difficulty urinating, vaginal pain and pelvic pain.  Musculoskeletal: Negative for joint swelling, arthralgias and gait problem.  Skin: Negative for rash.  Neurological: Positive for light-headedness. Negative for dizziness, syncope, speech difficulty, weakness, numbness and headaches.  Hematological: Negative for adenopathy.  Psychiatric/Behavioral: Negative for behavioral problems, dysphoric mood and agitation. The patient is not nervous/anxious.        Objective:   Physical Exam  Constitutional: She is oriented to person, place, and time. She appears well-developed and well-nourished.  HENT:  Head: Normocephalic.  Right Ear: External ear normal.  Left Ear: External ear normal.  Mouth/Throat: Oropharynx is  clear and moist.  Eyes: Conjunctivae and EOM are normal. Pupils are equal, round, and reactive to light.  Neck: Normal range of motion. Neck supple. No thyromegaly present.  Cardiovascular: Normal rate, regular rhythm, normal heart sounds and intact distal pulses.   Pulmonary/Chest: Effort normal and breath sounds normal.  Abdominal: Soft. Bowel sounds are normal. She exhibits no mass. There is no tenderness.  Musculoskeletal: Normal range of motion.  Lymphadenopathy:    She has no cervical adenopathy.  Neurological: She is alert and oriented to person, place, and time. No cranial nerve deficit. Coordination normal.        Finger to nose testing normal  Skin: Skin is warm and dry. No rash noted.  Psychiatric: She has a normal mood and affect. Her behavior is normal.          Assessment & Plan:   mild positional vertigo. We'll clinically observe at this time will check with her ophthalmologist about possible future use of antihistamines. She will call if symptoms worsen.

## 2011-03-07 NOTE — Assessment & Plan Note (Signed)
[facility] OFFICE NOTE   Nancy Neal, Nancy Neal                           MRN:          [id number]  DATE:11/18/2006                            DOB:          May 09, 1932    A 77 year old female seen today for a wellness exam.  Shows a history of  osteopenia.  She has allergic rhinitis.   Complaints today include some increasing insomnia, nervousness.  She  states that she has been very concerned about the depressed market.  She  has a history of atrophic vaginitis, does see her gynecologist annually.  She is on calcium and vitamin E supplements as well as Allegra p.r.n.   REVIEW OF SYSTEMS:  Exam is otherwise fairly noncontributory.  Did have  a colonoscopy in 2003.   FAMILY HISTORY:  Unchanged.  Father died of complication of cirrhosis  and coronary artery disease.   EXAMINATION:  Revealed a well-developed fit appearing white female who  appeared younger than her stated age.  Blood pressure was low normal.  SKIN:  Unremarkable.  She did have a slightly erythematous patchy,  slightly excoriated rash over the anterior chest.  FUNDI, EAR, NOSE, AND THROAT:  Clear.  NECK:  No bruits or adenopathy, no thyroid enlargement.  CHEST:  Clear.  BREAST:  Negative.  CARDIOVASCULAR:  Normal heart sounds, no murmurs.  ABDOMEN:  Benign, no organomegaly.  EXTREMITIES:  Full peripheral pulses, no edema.  NEURO:  Negative.   IMPRESSION:  Osteopenia.  She has allergic rhinitis, menopausal  syndrome, mild insomnia.   DISPOSITION:  Laboratory update will be reviewed.  Generic Flonase will  be added to her regimen.  Return in 1 year or p.r.n.     Gordy Savers, MD  Electronically Signed    PFK/MedQ  DD: 11/18/2006  DT: 11/18/2006  Job #: [id number]

## 2011-04-10 ENCOUNTER — Other Ambulatory Visit: Payer: Self-pay | Admitting: Internal Medicine

## 2011-04-10 DIAGNOSIS — Z1231 Encounter for screening mammogram for malignant neoplasm of breast: Secondary | ICD-10-CM

## 2011-05-19 ENCOUNTER — Ambulatory Visit (HOSPITAL_COMMUNITY)
Admission: RE | Admit: 2011-05-19 | Discharge: 2011-05-19 | Disposition: A | Discharge status: Home / Self Care | Payer: Medicare Other | Source: Ambulatory Visit | Attending: Internal Medicine | Admitting: Internal Medicine

## 2011-05-19 DIAGNOSIS — Z1231 Encounter for screening mammogram for malignant neoplasm of breast: Secondary | ICD-10-CM

## 2011-07-01 ENCOUNTER — Other Ambulatory Visit: Payer: Self-pay | Admitting: Obstetrics and Gynecology

## 2011-07-18 ENCOUNTER — Encounter: Payer: Self-pay | Admitting: Internal Medicine

## 2011-07-25 LAB — CBC
MCV: 96.9 fL (ref 78.0–100.0)
RBC: 4.23 MIL/uL (ref 3.87–5.11)
WBC: 6.3 10*3/uL (ref 4.0–10.5)

## 2011-07-25 LAB — POCT I-STAT, CHEM 8
HCT: 44 % (ref 36.0–46.0)
Hemoglobin: 15 g/dL (ref 12.0–15.0)
Potassium: 4 mEq/L (ref 3.5–5.1)
Sodium: 141 mEq/L (ref 135–145)

## 2011-07-25 LAB — DIFFERENTIAL
Lymphs Abs: 0.7 10*3/uL (ref 0.7–4.0)
Monocytes Relative: 3 % (ref 3–12)
Neutro Abs: 5.4 10*3/uL (ref 1.7–7.7)
Neutrophils Relative %: 86 % — ABNORMAL HIGH (ref 43–77)

## 2011-07-25 LAB — POCT CARDIAC MARKERS
CKMB, poc: 1.1 ng/mL (ref 1.0–8.0)
Myoglobin, poc: 131 ng/mL (ref 12–200)

## 2011-07-25 LAB — URINALYSIS, ROUTINE W REFLEX MICROSCOPIC
Hgb urine dipstick: NEGATIVE
Nitrite: NEGATIVE
Specific Gravity, Urine: 1.01 (ref 1.005–1.030)
Urobilinogen, UA: 0.2 mg/dL (ref 0.0–1.0)
pH: 7 (ref 5.0–8.0)

## 2011-12-18 ENCOUNTER — Ambulatory Visit (INDEPENDENT_AMBULATORY_CARE_PROVIDER_SITE_OTHER): Payer: Medicare Other | Admitting: Internal Medicine

## 2011-12-18 ENCOUNTER — Encounter: Payer: Self-pay | Admitting: Internal Medicine

## 2011-12-18 VITALS — BP 118/80 | HR 70 | Temp 97.8°F | Resp 16 | Ht 64.75 in | Wt 141.0 lb

## 2011-12-18 DIAGNOSIS — M949 Disorder of cartilage, unspecified: Secondary | ICD-10-CM | POA: Diagnosis not present

## 2011-12-18 DIAGNOSIS — N952 Postmenopausal atrophic vaginitis: Secondary | ICD-10-CM | POA: Diagnosis not present

## 2011-12-18 DIAGNOSIS — Z0000 Encounter for general adult medical examination without abnormal findings: Secondary | ICD-10-CM | POA: Diagnosis not present

## 2011-12-18 DIAGNOSIS — J309 Allergic rhinitis, unspecified: Secondary | ICD-10-CM

## 2011-12-18 DIAGNOSIS — E785 Hyperlipidemia, unspecified: Secondary | ICD-10-CM

## 2011-12-18 DIAGNOSIS — H6120 Impacted cerumen, unspecified ear: Secondary | ICD-10-CM | POA: Diagnosis not present

## 2011-12-18 DIAGNOSIS — M899 Disorder of bone, unspecified: Secondary | ICD-10-CM | POA: Diagnosis not present

## 2011-12-18 DIAGNOSIS — Z23 Encounter for immunization: Secondary | ICD-10-CM

## 2011-12-18 LAB — CBC WITH DIFFERENTIAL/PLATELET
Basophils Relative: 0.5 % (ref 0.0–3.0)
Eosinophils Absolute: 0.1 10*3/uL (ref 0.0–0.7)
Eosinophils Relative: 2.3 % (ref 0.0–5.0)
Lymphocytes Relative: 35 % (ref 12.0–46.0)
MCHC: 33.8 g/dL (ref 30.0–36.0)
MCV: 96 fl (ref 78.0–100.0)
Monocytes Absolute: 0.6 10*3/uL (ref 0.1–1.0)
Neutrophils Relative %: 50 % (ref 43.0–77.0)
Platelets: 219 10*3/uL (ref 150.0–400.0)
RBC: 4.41 Mil/uL (ref 3.87–5.11)
WBC: 4.7 10*3/uL (ref 4.5–10.5)

## 2011-12-18 LAB — COMPREHENSIVE METABOLIC PANEL
Albumin: 4.3 g/dL (ref 3.5–5.2)
BUN: 23 mg/dL (ref 6–23)
Calcium: 9.5 mg/dL (ref 8.4–10.5)
Chloride: 106 mEq/L (ref 96–112)
Glucose, Bld: 90 mg/dL (ref 70–99)
Potassium: 5.4 mEq/L — ABNORMAL HIGH (ref 3.5–5.1)
Sodium: 140 mEq/L (ref 135–145)
Total Protein: 6.7 g/dL (ref 6.0–8.3)

## 2011-12-18 LAB — TSH: TSH: 1.28 u[IU]/mL (ref 0.35–5.50)

## 2011-12-18 LAB — LDL CHOLESTEROL, DIRECT: Direct LDL: 123.2 mg/dL

## 2011-12-18 NOTE — Patient Instructions (Signed)
Take a calcium supplement, plus 800-1200 units of vitamin D    It is important that you exercise regularly, at least 20 minutes 3 to 4 times per week.  If you develop chest pain or shortness of breath seek  medical attention.  Return in one year for follow-up   

## 2011-12-18 NOTE — Progress Notes (Signed)
Subjective:    Patient ID: Nancy Neal, female    DOB: 10/26/1931, 78 y.o.   MRN: [id number]  HPI  78 year old patient who is seen today for a preventive health examination.  She is seen annually in the fall by gynecology. She is scheduled for a mammogram later this spring. She has a history of mild allergic rhinitis atrophic vaginitis and osteopenia. She remains on calcium and vitamin D supplementations. She takes a probiotic for mild IBS but takes no prescription medications. She is followed in the by ophthalmology due to mild glaucoma. Her last screening colonoscopy was 2009.   Medicare wellness visit:   1. Risk factors, based on past M,S,F history- No significant cardiovascular risk factors  2. Physical activities: remains quite active goes to a health club 4 times weekly  3. Depression/mood: no history depression or mood disorder  4. Hearing: no hearing deficits  5. ADL's: independent in all aspects of daily living  6. Fall risk: low  7. Home safety: no problems identified  8. Height weight, and visual acuity; height and weight stable no difficulty with visual acuity  9. Counseling: calcium and vitamin D supplementation as well as exercise all encouraged annual mammograms encouraged  10. Lab orders based on risk factors: laboratory profile including TSH will be reviewed  11. Referral :  Followup ophthalmology annually as well as GYN 12. Care plan: continue heart healthy diet regular exercise  13. Cognitive assessment: Alert and oriented with normal affect no cognitive dysfunction      Review of Systems  Constitutional: Negative for fever, appetite change, fatigue and unexpected weight change.  HENT: Positive for hearing loss (left ear). Negative for ear pain, nosebleeds, congestion, sore throat, mouth sores, trouble swallowing, neck stiffness, dental problem, voice change, sinus pressure and tinnitus.   Eyes: Negative for photophobia, pain, redness and visual disturbance.    Respiratory: Negative for cough, chest tightness and shortness of breath.   Cardiovascular: Negative for chest pain, palpitations and leg swelling.  Gastrointestinal: Negative for nausea, vomiting, abdominal pain, diarrhea, constipation, blood in stool, abdominal distention and rectal pain.  Genitourinary: Negative for dysuria, urgency, frequency, hematuria, flank pain, vaginal bleeding, vaginal discharge, difficulty urinating, genital sores, vaginal pain, menstrual problem and pelvic pain.  Musculoskeletal: Negative for back pain and arthralgias.  Skin: Negative for rash.  Neurological: Negative for dizziness, syncope, speech difficulty, weakness, light-headedness, numbness and headaches.  Hematological: Negative for adenopathy. Does not bruise/bleed easily.  Psychiatric/Behavioral: Negative for suicidal ideas, behavioral problems, self-injury, dysphoric mood and agitation. The patient is not nervous/anxious.        Objective:   Physical Exam  Constitutional: She is oriented to person, place, and time. She appears well-developed and well-nourished.  HENT:  Head: Normocephalic and atraumatic.  Right Ear: External ear normal.  Left Ear: External ear normal.  Mouth/Throat: Oropharynx is clear and moist.       Cerumen left canal  Eyes: Conjunctivae and EOM are normal.  Neck: Normal range of motion. Neck supple. No JVD present. No thyromegaly present.  Cardiovascular: Normal rate, regular rhythm, normal heart sounds and intact distal pulses.   No murmur heard. Pulmonary/Chest: Effort normal and breath sounds normal. She has no wheezes. She has no rales.  Abdominal: Soft. Bowel sounds are normal. She exhibits no distension and no mass. There is no tenderness. There is no rebound and no guarding.  Musculoskeletal: Normal range of motion. She exhibits no edema and no tenderness.  Neurological: She is alert and oriented to person,  place, and time. She has normal reflexes. No cranial nerve  deficit. She exhibits normal muscle tone. Coordination normal.  Skin: Skin is warm and dry. No rash noted.  Psychiatric: She has a normal mood and affect. Her behavior is normal.          Assessment & Plan:  Preventive health examination  Cerumen impaction. Left canal irrigated until clear Osteopenia. Continue calcium and vitamin D supplements and active lifestyle Allergic rhinitis stable Mild glaucoma. Followup ophthalmology  Return here one year

## 2012-02-18 DIAGNOSIS — H2510 Age-related nuclear cataract, unspecified eye: Secondary | ICD-10-CM | POA: Diagnosis not present

## 2012-02-18 DIAGNOSIS — H401290 Low-tension glaucoma, unspecified eye, stage unspecified: Secondary | ICD-10-CM | POA: Diagnosis not present

## 2012-02-18 DIAGNOSIS — H43819 Vitreous degeneration, unspecified eye: Secondary | ICD-10-CM | POA: Diagnosis not present

## 2012-04-27 ENCOUNTER — Other Ambulatory Visit: Payer: Self-pay | Admitting: Internal Medicine

## 2012-04-27 DIAGNOSIS — Z1231 Encounter for screening mammogram for malignant neoplasm of breast: Secondary | ICD-10-CM

## 2012-05-19 ENCOUNTER — Ambulatory Visit (HOSPITAL_COMMUNITY)
Admission: RE | Admit: 2012-05-19 | Discharge: 2012-05-19 | Disposition: A | Discharge status: Home / Self Care | Payer: Medicare Other | Source: Ambulatory Visit | Attending: Internal Medicine | Admitting: Internal Medicine

## 2012-05-19 DIAGNOSIS — Z1231 Encounter for screening mammogram for malignant neoplasm of breast: Secondary | ICD-10-CM | POA: Insufficient documentation

## 2012-07-29 DIAGNOSIS — Z1289 Encounter for screening for malignant neoplasm of other sites: Secondary | ICD-10-CM | POA: Diagnosis not present

## 2012-08-04 DIAGNOSIS — Z1211 Encounter for screening for malignant neoplasm of colon: Secondary | ICD-10-CM | POA: Diagnosis not present

## 2012-09-07 ENCOUNTER — Telehealth: Payer: Self-pay | Admitting: Internal Medicine

## 2012-09-07 NOTE — Telephone Encounter (Signed)
Please see note below and advise if you would like me sched her an appt & when. Thanks!

## 2012-09-07 NOTE — Telephone Encounter (Signed)
rov this week if patient unimproved

## 2012-09-07 NOTE — Telephone Encounter (Signed)
Patient Information:  Caller Name: [name]  Phone: [phone]  Patient: Nancy Neal  Gender: Female  DOB: 1932/10/13  Age: 78 Years  PCP: Eleonore Chiquito [facility])   Symptoms  Reason For Call & Symptoms: Metallic taste in mouth, lips feel swollen and tingly, and pain in roof in the mouth  Reviewed Health History In EMR: Yes  Reviewed Medications In EMR: Yes  Reviewed Allergies In EMR: Yes  Date of Onset of Symptoms: 09/04/2012  Treatments Tried: Patient brushed tongue and rinsed out with mouthwash.  Treatments Tried Worked: No  Guideline(s) Used:  No Protocol Available - Sick Adult  Disposition Per Guideline:   Discuss with PCP and Callback by Nurse within 1 Hour  Reason For Disposition Reached:   Nursing judgment  Advice Given:  Call Back If:  New symptoms develop  You become worse.  Office Follow Up:  Does the office need to follow up with this patient?: Yes  Instructions For The Office: Please call patient back with further instruction or appointment if evaluation is recommended  RN Note:  No Guidelines to follow for Metallic taste in mouth recommended to be seen in office. Patient can be contacted at [phone] between 11 and 5. Only thing that patient can relate to cause is she drank 2 8 ounce glasses of grapefruit juice also recently chewed a fast acting lactose tab which she normally swallows.

## 2012-09-07 NOTE — Telephone Encounter (Signed)
LMOM for pt to call back and make OV this wk if unimproved.

## 2012-09-09 ENCOUNTER — Encounter: Payer: Self-pay | Admitting: Internal Medicine

## 2012-09-09 ENCOUNTER — Ambulatory Visit (INDEPENDENT_AMBULATORY_CARE_PROVIDER_SITE_OTHER): Payer: Medicare Other | Admitting: Internal Medicine

## 2012-09-09 VITALS — BP 124/80 | HR 67 | Temp 98.0°F | Resp 16 | Wt 138.0 lb

## 2012-09-09 DIAGNOSIS — B351 Tinea unguium: Secondary | ICD-10-CM | POA: Diagnosis not present

## 2012-09-09 DIAGNOSIS — J309 Allergic rhinitis, unspecified: Secondary | ICD-10-CM

## 2012-09-09 MED ORDER — TERBINAFINE HCL 250 MG PO TABS: 250.0000 mg | ORAL_TABLET | Freq: Every day | ORAL | Status: DC

## 2012-09-09 NOTE — Patient Instructions (Addendum)
Diet for Gastroesophageal Reflux Disease, Adult  Reflux (acid reflux) is when acid from your stomach flows up into the esophagus. When acid comes in contact with the esophagus, the acid causes irritation and soreness (inflammation) in the esophagus. When reflux happens often or so severely that it causes damage to the esophagus, it is called gastroesophageal reflux disease (GERD). Nutrition therapy can help ease the discomfort of GERD.  FOODS OR DRINKS TO AVOID OR LIMIT   Smoking or chewing tobacco. Nicotine is one of the most potent stimulants to acid production in the gastrointestinal tract.   Caffeinated and decaffeinated coffee and black tea.   Regular or low-calorie carbonated beverages or energy drinks (caffeine-free carbonated beverages are allowed).    Strong spices, such as black pepper, white pepper, red pepper, cayenne, curry powder, and chili powder.   Peppermint or spearmint.   Chocolate.   High-fat foods, including meats and fried foods. Extra added fats including oils, butter, salad dressings, and nuts. Limit these to less than 8 tsp per day.   Fruits and vegetables if they are not tolerated, such as citrus fruits or tomatoes.   Alcohol.   Any food that seems to aggravate your condition.  If you have questions regarding your diet, call your caregiver or a registered dietitian.  OTHER THINGS THAT MAY HELP GERD INCLUDE:    Eating your meals slowly, in a relaxed setting.   Eating 5 to 6 small meals per day instead of 3 large meals.   Eliminating food for a period of time if it causes distress.   Not lying down until 3 hours after eating a meal.   Keeping the head of your bed raised 6 to 9 inches (15 to 23 cm) by using a foam wedge or blocks under the legs of the bed. Lying flat may make symptoms worse.   Being physically active. Weight loss may be helpful in reducing reflux in overweight or obese adults.   Wear loose fitting clothing  EXAMPLE MEAL PLAN  This meal plan is approximately  2,000 calories based on [employer]My[employer].gov meal planning guidelines.  Breakfast    cup cooked oatmeal.   1 cup strawberries.   1 cup low-fat milk.   1 oz almonds.  Snack   1 cup cucumber slices.   6 oz yogurt (made from low-fat or fat-free milk).  Lunch   2 slice whole-wheat bread.   2 oz sliced turkey.   2 tsp mayonnaise.   1 cup blueberries.   1 cup snap peas.  Snack   6 whole-wheat crackers.   1 oz string cheese.  Dinner    cup brown rice.   1 cup mixed veggies.   1 tsp olive oil.   3 oz grilled fish.  Document Released: 10/06/2005 Document Revised: 12/29/2011 Document Reviewed: 08/22/2011  [employer] Patient Information 2013 [employer].

## 2012-09-09 NOTE — Progress Notes (Signed)
Subjective:    Patient ID: Nancy Neal, female    DOB: [birth year], 78 y.o.   MRN: [id number]  HPI  78 year old patient who has a history of allergic rhinitis.  Since late last week she has had some concerns about her tongue that has been a bit sensitive and she has had a bit of a metallic taste. She also complains of thickened discolored nails and she requests treatment.  Past Medical History  Diagnosis Date  . ALLERGIC RHINITIS 06/02/2007  . OSTEOPENIA 06/02/2007  . VAGINITIS, ATROPHIC, POSTMENOPAUSAL 12/06/2008  . VERTIGO 02/16/2009  . Glaucoma(365) 1-12    History   Social History  . Marital Status: Widowed    Spouse Name: N/A    Number of Children: N/A  . Years of Education: N/A   Occupational History  . Not on file.   Social History Main Topics  . Smoking status: Never Smoker   . Smokeless tobacco: Never Used  . Alcohol Use: Yes  . Drug Use: No  . Sexually Active: Not on file   Other Topics Concern  . Not on file   Social History Narrative  . No narrative on file    Past Surgical History  Procedure Date  . Abdominal hysterectomy   . Eye surgery     for glaucoma    Family History  Problem Relation Age of Onset  . Suicidality Mother   . Heart disease Father   . Arthritis Brother     Allergies  Allergen Reactions  . Codeine Sulfate   . Meclizine Other (See Comments)    glaucoma    Current Outpatient Prescriptions on File Prior to Visit  Medication Sig Dispense Refill  . calcium carbonate (CALCIUM 500) 1250 MG tablet Take 1 tablet by mouth daily.        . carboxymethylcellulose (REFRESH PLUS) 0.5 % SOLN 1 drop 3 (three) times daily as needed.      . fexofenadine (ALLEGRA) 60 MG tablet Take 60 mg by mouth daily.        . multivitamin (THERAGRAN) per tablet Take 1 tablet by mouth daily.        . Probiotic Product (PROBIOTIC FORMULA PO) Take by mouth.      . Sodium Chloride-Sodium Bicarb (NASAMIST ISOTONIC NA) Place into the nose.        BP 124/80   Pulse 67  Temp 98 F (36.7 C) (Oral)  Resp 16  Wt 138 lb (62.596 kg)  SpO2 96%      Review of Systems  Constitutional: Negative.   HENT: Negative for hearing loss, congestion, sore throat, rhinorrhea, dental problem, sinus pressure and tinnitus.   Eyes: Negative for pain, discharge and visual disturbance.  Respiratory: Negative for cough and shortness of breath.   Cardiovascular: Negative for chest pain, palpitations and leg swelling.  Gastrointestinal: Negative for nausea, vomiting, abdominal pain, diarrhea, constipation, blood in stool and abdominal distention.  Genitourinary: Negative for dysuria, urgency, frequency, hematuria, flank pain, vaginal bleeding, vaginal discharge, difficulty urinating, vaginal pain and pelvic pain.  Musculoskeletal: Negative for joint swelling, arthralgias and gait problem.  Skin: Negative for rash.       Nail  changes  Neurological: Negative for dizziness, syncope, speech difficulty, weakness, numbness and headaches.  Hematological: Negative for adenopathy.  Psychiatric/Behavioral: Negative for behavioral problems, dysphoric mood and agitation. The patient is not nervous/anxious.        Objective:   Physical Exam  Constitutional: She is oriented to person, place, and time. She appears  well-developed and well-nourished.  HENT:  Head: Normocephalic.  Right Ear: External ear normal.  Left Ear: External ear normal.  Mouth/Throat: Oropharynx is clear and moist.       Normal appearing time  Eyes: Conjunctivae normal and EOM are normal. Pupils are equal, round, and reactive to light.  Neck: Normal range of motion. Neck supple. No thyromegaly present.  Cardiovascular: Normal rate, regular rhythm, normal heart sounds and intact distal pulses.   Pulmonary/Chest: Effort normal and breath sounds normal.  Abdominal: Soft. Bowel sounds are normal. She exhibits no mass. There is no tenderness.  Musculoskeletal: Normal range of motion.  Lymphadenopathy:     She has no cervical adenopathy.  Neurological: She is alert and oriented to person, place, and time.  Skin: Skin is warm and dry. No rash noted.       Toenail onychomycosis  Psychiatric: She has a normal mood and affect. Her behavior is normal.          Assessment & Plan:   Toenail onychomycosis-   Lamisil AR- same Rx  Schedule CPX

## 2012-10-05 DIAGNOSIS — M25819 Other specified joint disorders, unspecified shoulder: Secondary | ICD-10-CM | POA: Diagnosis not present

## 2012-11-24 DIAGNOSIS — H401290 Low-tension glaucoma, unspecified eye, stage unspecified: Secondary | ICD-10-CM | POA: Diagnosis not present

## 2012-11-24 DIAGNOSIS — H00029 Hordeolum internum unspecified eye, unspecified eyelid: Secondary | ICD-10-CM | POA: Diagnosis not present

## 2012-11-24 DIAGNOSIS — H2510 Age-related nuclear cataract, unspecified eye: Secondary | ICD-10-CM | POA: Diagnosis not present

## 2012-11-24 DIAGNOSIS — H04129 Dry eye syndrome of unspecified lacrimal gland: Secondary | ICD-10-CM | POA: Diagnosis not present

## 2012-12-20 ENCOUNTER — Ambulatory Visit (INDEPENDENT_AMBULATORY_CARE_PROVIDER_SITE_OTHER): Payer: Medicare Other | Admitting: Internal Medicine

## 2012-12-20 ENCOUNTER — Encounter: Payer: Self-pay | Admitting: Internal Medicine

## 2012-12-20 VITALS — BP 130/80 | HR 76 | Temp 97.6°F | Resp 18 | Ht 64.75 in | Wt 134.0 lb

## 2012-12-20 DIAGNOSIS — Z0000 Encounter for general adult medical examination without abnormal findings: Secondary | ICD-10-CM | POA: Diagnosis not present

## 2012-12-20 DIAGNOSIS — J309 Allergic rhinitis, unspecified: Secondary | ICD-10-CM

## 2012-12-20 DIAGNOSIS — R42 Dizziness and giddiness: Secondary | ICD-10-CM | POA: Diagnosis not present

## 2012-12-20 DIAGNOSIS — E785 Hyperlipidemia, unspecified: Secondary | ICD-10-CM | POA: Diagnosis not present

## 2012-12-20 NOTE — Patient Instructions (Signed)
It is important that you exercise regularly, at least 20 minutes 3 to 4 times per week.  If you develop chest pain or shortness of breath seek  medical attention.  Take a calcium supplement, plus 800-1200 units of vitamin D  Return in one year for follow-up   

## 2012-12-20 NOTE — Progress Notes (Signed)
Patient ID: Nancy Neal, female   DOB: 18-Feb-1932, 79 y.o.   MRN: [id number]  Subjective:    Patient ID: Nancy Neal, female    DOB: [birth year], 79 y.o.   MRN: [id number]  HPI  79 year old patient who is seen today for a preventive health examination.  She is seen annually in the fall by gynecology. She is scheduled for a mammogram later this spring. She has a history of mild allergic rhinitis atrophic vaginitis and osteopenia. She remains on calcium and vitamin D supplementations. She takes a probiotic for mild IBS but takes no prescription medications. She is followed in the by ophthalmology due to mild glaucoma. Her last screening colonoscopy was 2009.   Medicare wellness visit:   1. Risk factors, based on past M,S,F history- No significant cardiovascular risk factors  2. Physical activities: remains quite active goes to a health club 4 times weekly  3. Depression/mood: no history depression or mood disorder  4. Hearing: no hearing deficits  5. ADL's: independent in all aspects of daily living  6. Fall risk: low  7. Home safety: no problems identified  8. Height weight, and visual acuity; height and weight stable no difficulty with visual acuity  9. Counseling: calcium and vitamin D supplementation as well as exercise all encouraged annual mammograms encouraged  10. Lab orders based on risk factors: laboratory profile including TSH will be reviewed  11. Referral :  Followup ophthalmology annually as well as GYN 12. Care plan: continue heart healthy diet regular exercise  13. Cognitive assessment: Alert and oriented with normal affect no cognitive dysfunction    Current Allergies:  ! DURATUSS AC 12 (PE HCL-DIPHENHYD HCL-DM HBRTAN)   Past Medical History:  Reviewed history from 11/25/2007 and no changes required:  Allergic rhinitis  Osteopenia  atrophic vaginitis   Past Surgical History:  Reviewed history from 06/02/2007 and no changes required:   Hysterectomy age 15   colonoscopy January 2009   Family History:  Reviewed history from 11/25/2007 and no changes required:   father died age 47, complications of cirrhosis, coronary artery disease  mother died of suicide death age 61  One brother history of osteoarthritis  two sisters are well  ulcer diabetes, one aunt, status post abdominal aneurysm repair   Social History:  widowed  nonsmoker  two children  Risk Factors:  Tobacco use: never    Past Medical History  Diagnosis Date  . ALLERGIC RHINITIS 06/02/2007  . OSTEOPENIA 06/02/2007  . VAGINITIS, ATROPHIC, POSTMENOPAUSAL 12/06/2008  . VERTIGO 02/16/2009  . Glaucoma(365) 1-12    History   Social History  . Marital Status: Widowed    Spouse Name: N/A    Number of Children: N/A  . Years of Education: N/A   Occupational History  . Not on file.   Social History Main Topics  . Smoking status: Never Smoker   . Smokeless tobacco: Never Used  . Alcohol Use: Yes  . Drug Use: No  . Sexually Active: Not on file   Other Topics Concern  . Not on file   Social History Narrative  . No narrative on file    Past Surgical History  Procedure Laterality Date  . Abdominal hysterectomy    . Eye surgery      for glaucoma    Family History  Problem Relation Age of Onset  . Suicidality Mother   . Heart disease Father   . Arthritis Brother     Allergies  Allergen Reactions  . Codeine  Sulfate   . Meclizine Other (See Comments)    glaucoma    Current Outpatient Prescriptions on File Prior to Visit  Medication Sig Dispense Refill  . calcium carbonate (CALCIUM 500) 1250 MG tablet Take 1 tablet by mouth daily.        . carboxymethylcellulose (REFRESH PLUS) 0.5 % SOLN 1 drop 3 (three) times daily as needed.      . fexofenadine (ALLEGRA) 60 MG tablet Take 60 mg by mouth daily.        Marland Kitchen FLUZONE HIGH-DOSE SUSP       . ibuprofen (ADVIL,MOTRIN) 600 MG tablet       . multivitamin (THERAGRAN) per tablet Take 1 tablet by mouth daily.        .  Probiotic Product (PROBIOTIC FORMULA PO) Take by mouth.      . Sodium Chloride-Sodium Bicarb (NASAMIST ISOTONIC NA) Place into the nose.      . terbinafine (LAMISIL) 250 MG tablet Take 1 tablet (250 mg total) by mouth daily.  90 tablet  0   No current facility-administered medications on file prior to visit.    BP 130/80  Pulse 76  Temp(Src) 97.6 F (36.4 C) (Oral)  Resp 18  Ht 5' 4.75" (1.645 m)  Wt 134 lb (60.782 kg)  BMI 22.46 kg/m2    Review of Systems  Constitutional: Negative for fever, appetite change, fatigue and unexpected weight change.  HENT: Positive for hearing loss (left ear). Negative for ear pain, nosebleeds, congestion, sore throat, mouth sores, trouble swallowing, neck stiffness, dental problem, voice change, sinus pressure and tinnitus.   Eyes: Negative for photophobia, pain, redness and visual disturbance.  Respiratory: Negative for cough, chest tightness and shortness of breath.   Cardiovascular: Negative for chest pain, palpitations and leg swelling.  Gastrointestinal: Negative for nausea, vomiting, abdominal pain, diarrhea, constipation, blood in stool, abdominal distention and rectal pain.  Genitourinary: Negative for dysuria, urgency, frequency, hematuria, flank pain, vaginal bleeding, vaginal discharge, difficulty urinating, genital sores, vaginal pain, menstrual problem and pelvic pain.  Musculoskeletal: Negative for back pain and arthralgias.  Skin: Negative for rash.  Neurological: Negative for dizziness, syncope, speech difficulty, weakness, light-headedness, numbness and headaches.  Hematological: Negative for adenopathy. Does not bruise/bleed easily.  Psychiatric/Behavioral: Negative for suicidal ideas, behavioral problems, self-injury, dysphoric mood and agitation. The patient is not nervous/anxious.        Objective:   Physical Exam  Constitutional: She is oriented to person, place, and time. She appears well-developed and well-nourished.  HENT:   Head: Normocephalic and atraumatic.  Right Ear: External ear normal.  Left Ear: External ear normal.  Mouth/Throat: Oropharynx is clear and moist.  Cerumen left canal  Eyes: Conjunctivae and EOM are normal.  Neck: Normal range of motion. Neck supple. No JVD present. No thyromegaly present.  Cardiovascular: Normal rate, regular rhythm, normal heart sounds and intact distal pulses.   No murmur heard. Pulmonary/Chest: Effort normal and breath sounds normal. She has no wheezes. She has no rales.  Abdominal: Soft. Bowel sounds are normal. She exhibits no distension and no mass. There is no tenderness. There is no rebound and no guarding.  Musculoskeletal: Normal range of motion. She exhibits no edema and no tenderness.  Neurological: She is alert and oriented to person, place, and time. She has normal reflexes. No cranial nerve deficit. She exhibits normal muscle tone. Coordination normal.  Skin: Skin is warm and dry. No rash noted.  Psychiatric: She has a normal mood and affect. Her behavior  is normal.          Assessment & Plan:  Preventive health examination  Cerumen impaction. Left canal irrigated until clear Osteopenia. Continue calcium and vitamin D supplements and active lifestyle Allergic rhinitis stable Mild glaucoma. Followup ophthalmology  Return here one year

## 2012-12-30 DIAGNOSIS — M25819 Other specified joint disorders, unspecified shoulder: Secondary | ICD-10-CM | POA: Diagnosis not present

## 2013-01-27 DIAGNOSIS — M25819 Other specified joint disorders, unspecified shoulder: Secondary | ICD-10-CM | POA: Diagnosis not present

## 2013-03-24 DIAGNOSIS — M25819 Other specified joint disorders, unspecified shoulder: Secondary | ICD-10-CM | POA: Diagnosis not present

## 2013-04-12 ENCOUNTER — Other Ambulatory Visit: Payer: Self-pay | Admitting: Internal Medicine

## 2013-04-12 DIAGNOSIS — Z1231 Encounter for screening mammogram for malignant neoplasm of breast: Secondary | ICD-10-CM

## 2013-05-20 ENCOUNTER — Ambulatory Visit (HOSPITAL_COMMUNITY)
Admission: RE | Admit: 2013-05-20 | Discharge: 2013-05-20 | Disposition: A | Discharge status: Home / Self Care | Payer: Medicare Other | Source: Ambulatory Visit | Attending: Internal Medicine | Admitting: Internal Medicine

## 2013-05-20 DIAGNOSIS — Z1231 Encounter for screening mammogram for malignant neoplasm of breast: Secondary | ICD-10-CM | POA: Diagnosis not present

## 2013-06-02 DIAGNOSIS — H43399 Other vitreous opacities, unspecified eye: Secondary | ICD-10-CM | POA: Diagnosis not present

## 2013-06-02 DIAGNOSIS — H2510 Age-related nuclear cataract, unspecified eye: Secondary | ICD-10-CM | POA: Diagnosis not present

## 2013-06-02 DIAGNOSIS — H401290 Low-tension glaucoma, unspecified eye, stage unspecified: Secondary | ICD-10-CM | POA: Diagnosis not present

## 2013-06-02 DIAGNOSIS — H35369 Drusen (degenerative) of macula, unspecified eye: Secondary | ICD-10-CM | POA: Diagnosis not present

## 2013-06-08 ENCOUNTER — Ambulatory Visit (INDEPENDENT_AMBULATORY_CARE_PROVIDER_SITE_OTHER): Payer: Medicare Other | Admitting: Internal Medicine

## 2013-06-08 ENCOUNTER — Encounter: Payer: Self-pay | Admitting: Internal Medicine

## 2013-06-08 ENCOUNTER — Telehealth: Payer: Self-pay | Admitting: Internal Medicine

## 2013-06-08 VITALS — BP 140/88 | HR 82 | Temp 98.0°F | Wt 141.0 lb

## 2013-06-08 DIAGNOSIS — J309 Allergic rhinitis, unspecified: Secondary | ICD-10-CM | POA: Diagnosis not present

## 2013-06-08 NOTE — Progress Notes (Signed)
Subjective:    Patient ID: Nancy Neal, female    DOB: October 29, 1931, 79 y.o.   MRN: [id number]  HPI  79 year old patient lifelong nonsmoker who has a history of allergic rhinitis who presents with a one-week history of postnasal drip and a foreign body sensation in her throat. She states that she feels a lump in her throat when she swallows.  No painful swallowing weight loss or other constitutional complaints. Denies any fever or cough  Past Medical History  Diagnosis Date  . ALLERGIC RHINITIS 06/02/2007  . OSTEOPENIA 06/02/2007  . VAGINITIS, ATROPHIC, POSTMENOPAUSAL 12/06/2008  . VERTIGO 02/16/2009  . Glaucoma 1-12    History   Social History  . Marital Status: Widowed    Spouse Name: N/A    Number of Children: N/A  . Years of Education: N/A   Occupational History  . Not on file.   Social History Main Topics  . Smoking status: Never Smoker   . Smokeless tobacco: Never Used  . Alcohol Use: Yes  . Drug Use: No  . Sexual Activity: Not on file   Other Topics Concern  . Not on file   Social History Narrative  . No narrative on file    Past Surgical History  Procedure Laterality Date  . Abdominal hysterectomy    . Eye surgery      for glaucoma    Family History  Problem Relation Age of Onset  . Suicidality Mother   . Heart disease Father   . Arthritis Brother     Allergies  Allergen Reactions  . Codeine Sulfate   . Meclizine Other (See Comments)    glaucoma    Current Outpatient Prescriptions on File Prior to Visit  Medication Sig Dispense Refill  . calcium carbonate (CALCIUM 500) 1250 MG tablet Take 1 tablet by mouth daily.        . carboxymethylcellulose (REFRESH PLUS) 0.5 % SOLN 1 drop 3 (three) times daily as needed.      . fexofenadine (ALLEGRA) 60 MG tablet Take 60 mg by mouth daily.        Marland Kitchen ibuprofen (ADVIL,MOTRIN) 600 MG tablet       . multivitamin (THERAGRAN) per tablet Take 1 tablet by mouth daily.        . Probiotic Product (PROBIOTIC FORMULA  PO) Take by mouth.      . Sodium Chloride-Sodium Bicarb (NASAMIST ISOTONIC NA) Place into the nose.       No current facility-administered medications on file prior to visit.    BP 140/88  Pulse 82  Temp(Src) 98 F (36.7 C) (Oral)  Wt 141 lb (63.957 kg)  BMI 23.64 kg/m2  SpO2 96%       Review of Systems  Constitutional: Negative.   HENT: Positive for postnasal drip. Negative for hearing loss, congestion, sore throat, rhinorrhea, dental problem, sinus pressure and tinnitus.   Eyes: Negative for pain, discharge and visual disturbance.  Respiratory: Negative for cough and shortness of breath.   Cardiovascular: Negative for chest pain, palpitations and leg swelling.  Gastrointestinal: Negative for nausea, vomiting, abdominal pain, diarrhea, constipation, blood in stool and abdominal distention.  Genitourinary: Negative for dysuria, urgency, frequency, hematuria, flank pain, vaginal bleeding, vaginal discharge, difficulty urinating, vaginal pain and pelvic pain.  Musculoskeletal: Negative for joint swelling, arthralgias and gait problem.  Skin: Negative for rash.  Neurological: Negative for dizziness, syncope, speech difficulty, weakness, numbness and headaches.  Hematological: Negative for adenopathy.  Psychiatric/Behavioral: Negative for behavioral problems, dysphoric mood  and agitation. The patient is not nervous/anxious.        Objective:   Physical Exam  Constitutional: She is oriented to person, place, and time. She appears well-developed and well-nourished.  HENT:  Head: Normocephalic.  Right Ear: External ear normal.  Left Ear: External ear normal.  Mouth/Throat: Oropharynx is clear and moist.  Eyes: Conjunctivae and EOM are normal. Pupils are equal, round, and reactive to light.  Neck: Normal range of motion. Neck supple. No thyromegaly present.  Cardiovascular: Normal rate, regular rhythm, normal heart sounds and intact distal pulses.   Pulmonary/Chest: Effort  normal and breath sounds normal.  Abdominal: Soft. Bowel sounds are normal. She exhibits no mass. There is no tenderness.  Musculoskeletal: Normal range of motion.  Lymphadenopathy:    She has no cervical adenopathy.  Neurological: She is alert and oriented to person, place, and time.  Skin: Skin is warm and dry. No rash noted.  Psychiatric: She has a normal mood and affect. Her behavior is normal.          Assessment & Plan:   Allergic rhinitis Foreign body sensation in the throat. This is probably related to a postnasal drip. Will treat with Allegra and observe. If symptoms fail to improve in 2-3 weeks we'll call the office for ENT referral

## 2013-06-08 NOTE — Patient Instructions (Signed)
Use Allegra daily Call in 2 or 3 weeks for ENT referral if symptoms persist  Otherwise return for your annual exam as scheduled

## 2013-06-08 NOTE — Telephone Encounter (Signed)
PT is inquiring to see if she needs a whooping cough vacine at her age. Please assist.

## 2013-06-09 NOTE — Telephone Encounter (Signed)
Left message on machine for patient to return our call 

## 2013-06-09 NOTE — Telephone Encounter (Signed)
Needs Tdap at her 10 yr interval for tetanus booster

## 2013-06-10 NOTE — Telephone Encounter (Signed)
Left detailed message on machine for patient that the whooping cough vaccine (Tdap) may not be covered by her insurance.  She can give them a call and if they will cover it we will be glad to administer this.

## 2013-06-10 NOTE — Telephone Encounter (Signed)
Pt would ike to know if she is too old to get a whooping cough vaccination.  Pt not sure if it just for children.

## 2013-07-12 ENCOUNTER — Telehealth: Payer: Self-pay | Admitting: Internal Medicine

## 2013-07-12 DIAGNOSIS — J309 Allergic rhinitis, unspecified: Secondary | ICD-10-CM

## 2013-07-12 NOTE — Telephone Encounter (Signed)
Pt called and stated that she has not improved since her last visit with Dr. Kirtland Bouchard, and that she would like to pursue the ENT referral that he spoke about. Please assist.

## 2013-07-13 NOTE — Telephone Encounter (Signed)
Pt notified referral order for ENT sent and someone will be contacting her.

## 2013-07-13 NOTE — Telephone Encounter (Signed)
Okay to refer to ENT 

## 2013-07-13 NOTE — Telephone Encounter (Signed)
Okay to do ENT referral?

## 2013-07-15 DIAGNOSIS — Z23 Encounter for immunization: Secondary | ICD-10-CM | POA: Diagnosis not present

## 2013-07-22 DIAGNOSIS — R1310 Dysphagia, unspecified: Secondary | ICD-10-CM | POA: Diagnosis not present

## 2013-07-22 DIAGNOSIS — J310 Chronic rhinitis: Secondary | ICD-10-CM | POA: Diagnosis not present

## 2013-12-14 DIAGNOSIS — H43399 Other vitreous opacities, unspecified eye: Secondary | ICD-10-CM | POA: Diagnosis not present

## 2013-12-14 DIAGNOSIS — H2510 Age-related nuclear cataract, unspecified eye: Secondary | ICD-10-CM | POA: Diagnosis not present

## 2013-12-14 DIAGNOSIS — H35369 Drusen (degenerative) of macula, unspecified eye: Secondary | ICD-10-CM | POA: Diagnosis not present

## 2013-12-21 ENCOUNTER — Encounter: Payer: Medicare Other | Admitting: Internal Medicine

## 2014-01-04 ENCOUNTER — Encounter: Payer: Self-pay | Admitting: Internal Medicine

## 2014-01-04 ENCOUNTER — Ambulatory Visit (INDEPENDENT_AMBULATORY_CARE_PROVIDER_SITE_OTHER): Payer: Medicare Other | Admitting: Internal Medicine

## 2014-01-04 VITALS — BP 130/80 | HR 74 | Temp 97.8°F | Resp 18 | Ht 64.5 in | Wt 143.0 lb

## 2014-01-04 DIAGNOSIS — M899 Disorder of bone, unspecified: Secondary | ICD-10-CM | POA: Diagnosis not present

## 2014-01-04 DIAGNOSIS — J309 Allergic rhinitis, unspecified: Secondary | ICD-10-CM

## 2014-01-04 DIAGNOSIS — R42 Dizziness and giddiness: Secondary | ICD-10-CM

## 2014-01-04 DIAGNOSIS — Z0000 Encounter for general adult medical examination without abnormal findings: Secondary | ICD-10-CM

## 2014-01-04 DIAGNOSIS — M949 Disorder of cartilage, unspecified: Secondary | ICD-10-CM | POA: Diagnosis not present

## 2014-01-04 DIAGNOSIS — Z23 Encounter for immunization: Secondary | ICD-10-CM | POA: Diagnosis not present

## 2014-01-04 DIAGNOSIS — E785 Hyperlipidemia, unspecified: Secondary | ICD-10-CM | POA: Diagnosis not present

## 2014-01-04 DIAGNOSIS — Z79899 Other long term (current) drug therapy: Secondary | ICD-10-CM

## 2014-01-04 LAB — CBC WITH DIFFERENTIAL/PLATELET
BASOS ABS: 0 10*3/uL (ref 0.0–0.1)
Basophils Relative: 0.5 % (ref 0.0–3.0)
Eosinophils Absolute: 0.2 10*3/uL (ref 0.0–0.7)
Eosinophils Relative: 4.7 % (ref 0.0–5.0)
HCT: 42 % (ref 36.0–46.0)
Hemoglobin: 14 g/dL (ref 12.0–15.0)
LYMPHS ABS: 1.4 10*3/uL (ref 0.7–4.0)
LYMPHS PCT: 27.4 % (ref 12.0–46.0)
MCHC: 33.3 g/dL (ref 30.0–36.0)
MCV: 96.7 fl (ref 78.0–100.0)
MONOS PCT: 12.8 % — AB (ref 3.0–12.0)
Monocytes Absolute: 0.6 10*3/uL (ref 0.1–1.0)
Neutro Abs: 2.8 10*3/uL (ref 1.4–7.7)
Neutrophils Relative %: 54.6 % (ref 43.0–77.0)
PLATELETS: 252 10*3/uL (ref 150.0–400.0)
RBC: 4.34 Mil/uL (ref 3.87–5.11)
RDW: 13.5 % (ref 11.5–14.6)
WBC: 5.1 10*3/uL (ref 4.5–10.5)

## 2014-01-04 LAB — COMPREHENSIVE METABOLIC PANEL
ALT: 18 U/L (ref 0–35)
AST: 21 U/L (ref 0–37)
Albumin: 4.2 g/dL (ref 3.5–5.2)
Alkaline Phosphatase: 59 U/L (ref 39–117)
BUN: 21 mg/dL (ref 6–23)
CALCIUM: 9.5 mg/dL (ref 8.4–10.5)
CHLORIDE: 104 meq/L (ref 96–112)
CO2: 30 meq/L (ref 19–32)
Creatinine, Ser: 1 mg/dL (ref 0.4–1.2)
GFR: 59.52 mL/min — AB (ref >60.00)
Glucose, Bld: 95 mg/dL (ref 70–99)
Potassium: 5.1 mEq/L (ref 3.5–5.1)
SODIUM: 139 meq/L (ref 135–145)
TOTAL PROTEIN: 6.6 g/dL (ref 6.0–8.3)
Total Bilirubin: 1.4 mg/dL — ABNORMAL HIGH (ref 0.3–1.2)

## 2014-01-04 LAB — LIPID PANEL
CHOL/HDL RATIO: 4
Cholesterol: 213 mg/dL — ABNORMAL HIGH (ref 0–200)
HDL: 60.1 mg/dL (ref >39.00)
LDL Cholesterol: 130 mg/dL — ABNORMAL HIGH (ref 0–99)
TRIGLYCERIDES: 114 mg/dL (ref 0.0–149.0)
VLDL: 22.8 mg/dL (ref 0.0–40.0)

## 2014-01-04 LAB — TSH: TSH: 1.36 u[IU]/mL (ref 0.35–5.50)

## 2014-01-04 NOTE — Patient Instructions (Addendum)
Take a calcium supplement, plus [phone] units of vitamin D    It is important that you exercise regularly, at least 20 minutes 3 to 4 times per week.  If you develop chest pain or shortness of breath seek  medical attention.  Return in one year for follow-up Gastroesophageal Reflux Disease, Adult Gastroesophageal reflux disease (GERD) happens when acid from your stomach flows up into the esophagus. When acid comes in contact with the esophagus, the acid causes soreness (inflammation) in the esophagus. Over time, GERD may create small holes (ulcers) in the lining of the esophagus. CAUSES   Increased body weight. This puts pressure on the stomach, making acid rise from the stomach into the esophagus.  Smoking. This increases acid production in the stomach.  Drinking alcohol. This causes decreased pressure in the lower esophageal sphincter (valve or ring of muscle between the esophagus and stomach), allowing acid from the stomach into the esophagus.  Late evening meals and a full stomach. This increases pressure and acid production in the stomach.  A malformed lower esophageal sphincter. Sometimes, no cause is found. SYMPTOMS   Burning pain in the lower part of the mid-chest behind the breastbone and in the mid-stomach area. This may occur twice a week or more often.  Trouble swallowing.  Sore throat.  Dry cough.  Asthma-like symptoms including chest tightness, shortness of breath, or wheezing. DIAGNOSIS  Your caregiver may be able to diagnose GERD based on your symptoms. In some cases, X-rays and other tests may be done to check for complications or to check the condition of your stomach and esophagus. TREATMENT  Your caregiver may recommend over-the-counter or prescription medicines to help decrease acid production. Ask your caregiver before starting or adding any new medicines.  HOME CARE INSTRUCTIONS   Change the factors that you can control. Ask your caregiver for guidance  concerning weight loss, quitting smoking, and alcohol consumption.  Avoid foods and drinks that make your symptoms worse, such as:  Caffeine or alcoholic drinks.  Chocolate.  Peppermint or mint flavorings.  Garlic and onions.  Spicy foods.  Citrus fruits, such as oranges, lemons, or limes.  Tomato-based foods such as sauce, chili, salsa, and pizza.  Fried and fatty foods.  Avoid lying down for the 3 hours prior to your bedtime or prior to taking a nap.  Eat small, frequent meals instead of large meals.  Wear loose-fitting clothing. Do not wear anything tight around your waist that causes pressure on your stomach.  Raise the head of your bed 6 to 8 inches with wood blocks to help you sleep. Extra pillows will not help.  Only take over-the-counter or prescription medicines for pain, discomfort, or fever as directed by your caregiver.  Do not take aspirin, ibuprofen, or other nonsteroidal anti-inflammatory drugs (NSAIDs). SEEK IMMEDIATE MEDICAL CARE IF:   You have pain in your arms, neck, jaw, teeth, or back.  Your pain increases or changes in intensity or duration.  You develop nausea, vomiting, or sweating (diaphoresis).  You develop shortness of breath, or you faint.  Your vomit is green, yellow, black, or looks like coffee grounds or blood.  Your stool is red, bloody, or black. These symptoms could be signs of other problems, such as heart disease, gastric bleeding, or esophageal bleeding. MAKE SURE YOU:   Understand these instructions.  Will watch your condition.  Will get help right away if you are not doing well or get worse. Document Released: 07/16/2005 Document Revised: 12/29/2011 Document Reviewed: 04/25/2011  [employer] Patient Information 2014 [employer]  

## 2014-01-04 NOTE — Progress Notes (Signed)
Pre-visit discussion using our clinic review tool. No additional management support is needed unless otherwise documented below in the visit note.  

## 2014-01-04 NOTE — Progress Notes (Signed)
Patient ID: Nancy Neal, female   DOB: Jul 17, 1932, 80 y.o.   MRN: [id number]  Subjective:    Patient ID: Nancy Neal, female    DOB: Jul 17, 1932, 80 y.o.   MRN: [id number]  HPI 80 year-old patient who is seen today for a preventive health examination.  She is seen annually in the fall by gynecology. She is scheduled for a mammogram later this spring. She has a history of mild allergic rhinitis atrophic vaginitis and osteopenia. She remains on calcium and vitamin D supplementations. She takes a probiotic for mild IBS but takes no prescription medications. She is followed in the by ophthalmology due to mild glaucoma. Her last screening colonoscopy was 2009. She has had an ENT evaluation since her last preventive health examination   Medicare wellness visit:   1. Risk factors, based on past M,S,F history- No significant cardiovascular risk factors  2. Physical activities: remains quite active goes to a health club 4 times weekly  3. Depression/mood: no history depression or mood disorder  4. Hearing: no hearing deficits  5. ADL's: independent in all aspects of daily living  6. Fall risk: low  7. Home safety: no problems identified  8. Height weight, and visual acuity; height and weight stable no difficulty with visual acuity  9. Counseling: calcium and vitamin D supplementation as well as exercise all encouraged annual mammograms encouraged  10. Lab orders based on risk factors: laboratory profile including TSH will be reviewed  11. Referral :  Followup ophthalmology annually as well as GYN 12. Care plan: continue heart healthy diet regular exercise  13. Cognitive assessment: Alert and oriented with normal affect no cognitive dysfunction    Current Allergies:  ! DURATUSS AC 12 (PE HCL-DIPHENHYD HCL-DM HBRTAN)   Past Medical History:  Reviewed history from 11/25/2007 and no changes required:  Allergic rhinitis  Osteopenia  atrophic vaginitis   Past Surgical History:  Reviewed history  from 06/02/2007 and no changes required:   Hysterectomy age 80  colonoscopy January 2009   Family History:  Reviewed history from 11/25/2007 and no changes required:   father died age 78, complications of cirrhosis, coronary artery disease  mother died of suicide death age 78  One brother history of osteoarthritis  two sisters are well  ulcer diabetes, one aunt, status post abdominal aneurysm repair   Social History:  widowed  nonsmoker  two children  Risk Factors:  Tobacco use: never    Past Medical History  Diagnosis Date  . ALLERGIC RHINITIS 06/02/2007  . OSTEOPENIA 06/02/2007  . VAGINITIS, ATROPHIC, POSTMENOPAUSAL 12/06/2008  . VERTIGO 02/16/2009  . Glaucoma 1-12    History   Social History  . Marital Status: Widowed    Spouse Name: N/A    Number of Children: N/A  . Years of Education: N/A   Occupational History  . Not on file.   Social History Main Topics  . Smoking status: Never Smoker   . Smokeless tobacco: Never Used  . Alcohol Use: Yes  . Drug Use: No  . Sexual Activity: Not on file   Other Topics Concern  . Not on file   Social History Narrative  . No narrative on file    Past Surgical History  Procedure Laterality Date  . Abdominal hysterectomy    . Eye surgery      for glaucoma    Family History  Problem Relation Age of Onset  . Suicidality Mother   . Heart disease Father   . Arthritis  Brother     Allergies  Allergen Reactions  . Codeine Sulfate   . Meclizine Other (See Comments)    glaucoma    Current Outpatient Prescriptions on File Prior to Visit  Medication Sig Dispense Refill  . calcium carbonate (CALCIUM 500) 1250 MG tablet Take 1 tablet by mouth daily.        . carboxymethylcellulose (REFRESH PLUS) 0.5 % SOLN 1 drop 3 (three) times daily as needed.      . fexofenadine (ALLEGRA) 60 MG tablet Take 60 mg by mouth daily.        Marland Kitchen ibuprofen (ADVIL,MOTRIN) 600 MG tablet       . multivitamin (THERAGRAN) per tablet Take 1  tablet by mouth daily.        . Probiotic Product (PROBIOTIC FORMULA PO) Take by mouth.      . Sodium Chloride-Sodium Bicarb (NASAMIST ISOTONIC NA) Place into the nose.       No current facility-administered medications on file prior to visit.    BP 130/80  Pulse 74  Temp(Src) 97.8 F (36.6 C) (Oral)  Resp 18  Ht 5' 4.5" (1.638 m)  Wt 143 lb (64.864 kg)  BMI 24.18 kg/m2  SpO2 96%    Review of Systems  Constitutional: Negative for fever, appetite change, fatigue and unexpected weight change.  HENT: Positive for hearing loss (left ear). Negative for congestion, dental problem, ear pain, mouth sores, nosebleeds, sinus pressure, sore throat, tinnitus, trouble swallowing and voice change.   Eyes: Negative for photophobia, pain, redness and visual disturbance.  Respiratory: Negative for cough, chest tightness and shortness of breath.   Cardiovascular: Negative for chest pain, palpitations and leg swelling.  Gastrointestinal: Negative for nausea, vomiting, abdominal pain, diarrhea, constipation, blood in stool, abdominal distention and rectal pain.  Genitourinary: Negative for dysuria, urgency, frequency, hematuria, flank pain, vaginal bleeding, vaginal discharge, difficulty urinating, genital sores, vaginal pain, menstrual problem and pelvic pain.  Musculoskeletal: Negative for arthralgias, back pain and neck stiffness.  Skin: Negative for rash.  Neurological: Negative for dizziness, syncope, speech difficulty, weakness, light-headedness, numbness and headaches.  Hematological: Negative for adenopathy. Does not bruise/bleed easily.  Psychiatric/Behavioral: Negative for suicidal ideas, behavioral problems, self-injury, dysphoric mood and agitation. The patient is not nervous/anxious.        Objective:   Physical Exam  Constitutional: She is oriented to person, place, and time. She appears well-developed and well-nourished.  HENT:  Head: Normocephalic and atraumatic.  Right Ear:  External ear normal.  Left Ear: External ear normal.  Mouth/Throat: Oropharynx is clear and moist.  Cerumen left canal  Eyes: Conjunctivae and EOM are normal.  Neck: Normal range of motion. Neck supple. No JVD present. No thyromegaly present.  Cardiovascular: Normal rate, regular rhythm, normal heart sounds and intact distal pulses.   No murmur heard. Pulmonary/Chest: Effort normal and breath sounds normal. She has no wheezes. She has no rales.  Abdominal: Soft. Bowel sounds are normal. She exhibits no distension and no mass. There is no tenderness. There is no rebound and no guarding.  Musculoskeletal: Normal range of motion. She exhibits no edema and no tenderness.  Neurological: She is alert and oriented to person, place, and time. She has normal reflexes. No cranial nerve deficit. She exhibits normal muscle tone. Coordination normal.  Skin: Skin is warm and dry. No rash noted.  Psychiatric: She has a normal mood and affect. Her behavior is normal.          Assessment & Plan:  Preventive  health examination  Cerumen impaction. Left canal  Osteopenia. Continue calcium and vitamin D supplements and active lifestyle Allergic rhinitis stable Mild glaucoma. Followup ophthalmology  Return here one year

## 2014-04-26 ENCOUNTER — Other Ambulatory Visit: Payer: Self-pay | Admitting: Internal Medicine

## 2014-04-26 DIAGNOSIS — Z1231 Encounter for screening mammogram for malignant neoplasm of breast: Secondary | ICD-10-CM

## 2014-05-22 ENCOUNTER — Ambulatory Visit (HOSPITAL_COMMUNITY)
Admission: RE | Admit: 2014-05-22 | Discharge: 2014-05-22 | Disposition: A | Discharge status: Home / Self Care | Payer: Medicare Other | Source: Ambulatory Visit | Attending: Internal Medicine | Admitting: Internal Medicine

## 2014-05-22 DIAGNOSIS — Z1231 Encounter for screening mammogram for malignant neoplasm of breast: Secondary | ICD-10-CM | POA: Diagnosis not present

## 2014-06-28 DIAGNOSIS — M19019 Primary osteoarthritis, unspecified shoulder: Secondary | ICD-10-CM | POA: Diagnosis not present

## 2014-06-28 DIAGNOSIS — M25819 Other specified joint disorders, unspecified shoulder: Secondary | ICD-10-CM | POA: Diagnosis not present

## 2014-07-03 DIAGNOSIS — H5200 Hypermetropia, unspecified eye: Secondary | ICD-10-CM | POA: Diagnosis not present

## 2014-07-03 DIAGNOSIS — H2510 Age-related nuclear cataract, unspecified eye: Secondary | ICD-10-CM | POA: Diagnosis not present

## 2014-07-03 DIAGNOSIS — H2513 Age-related nuclear cataract, bilateral: Secondary | ICD-10-CM | POA: Insufficient documentation

## 2014-07-03 DIAGNOSIS — H401134 Primary open-angle glaucoma, bilateral, indeterminate stage: Secondary | ICD-10-CM | POA: Insufficient documentation

## 2014-07-03 DIAGNOSIS — H409 Unspecified glaucoma: Secondary | ICD-10-CM | POA: Diagnosis not present

## 2014-07-03 DIAGNOSIS — H4011X0 Primary open-angle glaucoma, stage unspecified: Secondary | ICD-10-CM | POA: Diagnosis not present

## 2014-07-05 ENCOUNTER — Ambulatory Visit: Payer: Medicare Other | Attending: Specialist | Admitting: Physical Therapy

## 2014-07-05 DIAGNOSIS — M25519 Pain in unspecified shoulder: Secondary | ICD-10-CM | POA: Diagnosis not present

## 2014-07-05 DIAGNOSIS — IMO0001 Reserved for inherently not codable concepts without codable children: Secondary | ICD-10-CM | POA: Diagnosis not present

## 2014-07-11 ENCOUNTER — Ambulatory Visit: Payer: Medicare Other | Admitting: Physical Therapy

## 2014-07-11 DIAGNOSIS — IMO0001 Reserved for inherently not codable concepts without codable children: Secondary | ICD-10-CM | POA: Diagnosis not present

## 2014-07-13 ENCOUNTER — Ambulatory Visit: Payer: Medicare Other | Admitting: Physical Therapy

## 2014-07-13 DIAGNOSIS — IMO0001 Reserved for inherently not codable concepts without codable children: Secondary | ICD-10-CM | POA: Diagnosis not present

## 2014-07-18 ENCOUNTER — Ambulatory Visit: Payer: Medicare Other | Admitting: Physical Therapy

## 2014-07-18 DIAGNOSIS — IMO0001 Reserved for inherently not codable concepts without codable children: Secondary | ICD-10-CM | POA: Diagnosis not present

## 2014-07-25 ENCOUNTER — Ambulatory Visit: Payer: Medicare Other | Attending: Specialist | Admitting: Physical Therapy

## 2014-07-25 DIAGNOSIS — M7541 Impingement syndrome of right shoulder: Secondary | ICD-10-CM | POA: Insufficient documentation

## 2014-07-28 DIAGNOSIS — M7542 Impingement syndrome of left shoulder: Secondary | ICD-10-CM | POA: Diagnosis not present

## 2014-07-28 DIAGNOSIS — M19012 Primary osteoarthritis, left shoulder: Secondary | ICD-10-CM | POA: Diagnosis not present

## 2014-08-01 ENCOUNTER — Ambulatory Visit: Payer: Medicare Other | Admitting: Physical Therapy

## 2014-08-01 DIAGNOSIS — M7541 Impingement syndrome of right shoulder: Secondary | ICD-10-CM | POA: Diagnosis not present

## 2014-08-10 ENCOUNTER — Ambulatory Visit: Payer: Medicare Other | Admitting: Physical Therapy

## 2014-08-10 DIAGNOSIS — M7541 Impingement syndrome of right shoulder: Secondary | ICD-10-CM | POA: Diagnosis not present

## 2014-08-21 ENCOUNTER — Ambulatory Visit: Payer: Medicare Other | Attending: Specialist | Admitting: Physical Therapy

## 2014-08-21 DIAGNOSIS — M7541 Impingement syndrome of right shoulder: Secondary | ICD-10-CM | POA: Diagnosis not present

## 2014-08-24 ENCOUNTER — Ambulatory Visit: Payer: Medicare Other | Admitting: Physical Therapy

## 2014-08-24 DIAGNOSIS — M7541 Impingement syndrome of right shoulder: Secondary | ICD-10-CM | POA: Diagnosis not present

## 2014-08-28 ENCOUNTER — Ambulatory Visit: Payer: Medicare Other | Admitting: Physical Therapy

## 2014-08-30 DIAGNOSIS — M7542 Impingement syndrome of left shoulder: Secondary | ICD-10-CM | POA: Diagnosis not present

## 2014-08-30 DIAGNOSIS — M19012 Primary osteoarthritis, left shoulder: Secondary | ICD-10-CM | POA: Diagnosis not present

## 2014-08-31 ENCOUNTER — Ambulatory Visit: Payer: Medicare Other | Admitting: Physical Therapy

## 2014-08-31 ENCOUNTER — Telehealth: Payer: Self-pay | Admitting: Internal Medicine

## 2014-08-31 MED ORDER — DIPHENOXYLATE-ATROPINE 2.5-0.025 MG PO TABS: 1.0000 | ORAL_TABLET | Freq: Four times a day (QID) | ORAL | Status: DC | PRN

## 2014-08-31 MED ORDER — PROMETHAZINE HCL 25 MG PO TABS: 25.0000 mg | ORAL_TABLET | Freq: Three times a day (TID) | ORAL | Status: DC | PRN

## 2014-08-31 NOTE — Telephone Encounter (Signed)
Please call in some generic Lomotil as well as Phenergan

## 2014-08-31 NOTE — Telephone Encounter (Signed)
Spoke to pt , told her two Rx's sent to pharmacy one for diarrhea the other for nausea. Pt verbalized understanding. Rx's faxed to pharmacy.

## 2014-08-31 NOTE — Telephone Encounter (Signed)
Has been throwing up and having watery loose stools since last night. Has no control over her stools and would like something called in to the pharmacy. Can have her son pick it up for her but does not think she can come in for an office visit.

## 2014-08-31 NOTE — Telephone Encounter (Signed)
Please see message and advise 

## 2014-09-29 DIAGNOSIS — H4011X4 Primary open-angle glaucoma, indeterminate stage: Secondary | ICD-10-CM | POA: Diagnosis not present

## 2014-10-09 DIAGNOSIS — M19011 Primary osteoarthritis, right shoulder: Secondary | ICD-10-CM | POA: Diagnosis not present

## 2014-10-09 DIAGNOSIS — M238X1 Other internal derangements of right knee: Secondary | ICD-10-CM | POA: Diagnosis not present

## 2014-10-23 DIAGNOSIS — M238X1 Other internal derangements of right knee: Secondary | ICD-10-CM | POA: Diagnosis not present

## 2014-11-01 DIAGNOSIS — M19011 Primary osteoarthritis, right shoulder: Secondary | ICD-10-CM | POA: Diagnosis not present

## 2014-11-01 DIAGNOSIS — M75121 Complete rotator cuff tear or rupture of right shoulder, not specified as traumatic: Secondary | ICD-10-CM | POA: Diagnosis not present

## 2014-11-06 ENCOUNTER — Ambulatory Visit (INDEPENDENT_AMBULATORY_CARE_PROVIDER_SITE_OTHER): Payer: Medicare Other | Admitting: Internal Medicine

## 2014-11-06 ENCOUNTER — Encounter: Payer: Self-pay | Admitting: Internal Medicine

## 2014-11-06 VITALS — BP 110/70 | HR 80 | Temp 98.1°F | Resp 20 | Ht 64.5 in | Wt 143.0 lb

## 2014-11-06 DIAGNOSIS — M899 Disorder of bone, unspecified: Secondary | ICD-10-CM

## 2014-11-06 DIAGNOSIS — M949 Disorder of cartilage, unspecified: Secondary | ICD-10-CM

## 2014-11-06 DIAGNOSIS — J3089 Other allergic rhinitis: Secondary | ICD-10-CM | POA: Diagnosis not present

## 2014-11-06 NOTE — Progress Notes (Signed)
Pre visit review using our clinic review tool, if applicable. No additional management support is needed unless otherwise documented below in the visit note. 

## 2014-11-06 NOTE — Progress Notes (Signed)
Subjective:    Patient ID: Nancy Neal, female    DOB: 1932-06-26, 81 y.o.   MRN: [id number]  HPI  81 year old patient who is seen today in follow-up.  She enjoys remarkably good health with a history of mild allergic rhinitis and osteopenia.  She is bothered intermittently with some mild reflux symptoms but in general does quite well. She is considering elective right shoulder surgery and is seen today basically for preoperative clearance. She has no cardiac history.  Remains quite active and exercises regularly on an elliptical machine.  No exercise limitations.  Exertional chest pain or shortness of breath.  EKGs in the past have been normal  Past Medical History  Diagnosis Date  . ALLERGIC RHINITIS 06/02/2007  . OSTEOPENIA 06/02/2007  . VAGINITIS, ATROPHIC, POSTMENOPAUSAL 12/06/2008  . VERTIGO 02/16/2009  . Glaucoma 1-12    History   Social History  . Marital Status: Widowed    Spouse Name: N/A    Number of Children: N/A  . Years of Education: N/A   Occupational History  . Not on file.   Social History Main Topics  . Smoking status: Never Smoker   . Smokeless tobacco: Never Used  . Alcohol Use: Yes  . Drug Use: No  . Sexual Activity: Not on file   Other Topics Concern  . Not on file   Social History Narrative    Past Surgical History  Procedure Laterality Date  . Abdominal hysterectomy    . Eye surgery      for glaucoma    Family History  Problem Relation Age of Onset  . Suicidality Mother   . Heart disease Father   . Arthritis Brother     Allergies  Allergen Reactions  . Codeine Sulfate   . Meclizine Other (See Comments)    glaucoma    Current Outpatient Prescriptions on File Prior to Visit  Medication Sig Dispense Refill  . calcium carbonate (CALCIUM 500) 1250 MG tablet Take 1 tablet by mouth daily.      . carboxymethylcellulose (REFRESH PLUS) 0.5 % SOLN 1 drop 3 (three) times daily as needed.    . fexofenadine (ALLEGRA) 60 MG tablet Take 60  mg by mouth daily.      . GuaiFENesin (MUCUS RELIEF ADULT PO) Take 1 tablet by mouth as needed.    Marland Kitchen ibuprofen (ADVIL,MOTRIN) 600 MG tablet     . multivitamin (THERAGRAN) per tablet Take 1 tablet by mouth daily.      Marland Kitchen omeprazole (PRILOSEC OTC) 20 MG tablet Take 20 mg by mouth daily.    . Probiotic Product (PROBIOTIC FORMULA PO) Take by mouth.    . Sodium Chloride-Sodium Bicarb (NASAMIST ISOTONIC NA) Place into the nose.     No current facility-administered medications on file prior to visit.    BP 110/70 mmHg  Pulse 80  Temp(Src) 98.1 F (36.7 C) (Oral)  Resp 20  Ht 5' 4.5" (1.638 m)  Wt 143 lb (64.864 kg)  BMI 24.18 kg/m2  SpO2 95%     Review of Systems  Constitutional: Negative.   HENT: Negative for congestion, dental problem, hearing loss, rhinorrhea, sinus pressure, sore throat and tinnitus.   Eyes: Negative for pain, discharge and visual disturbance.  Respiratory: Negative for cough and shortness of breath.   Cardiovascular: Negative for chest pain, palpitations and leg swelling.  Gastrointestinal: Negative for nausea, vomiting, abdominal pain, diarrhea, constipation, blood in stool and abdominal distention.  Genitourinary: Negative for dysuria, urgency, frequency, hematuria, flank pain,  vaginal bleeding, vaginal discharge, difficulty urinating, vaginal pain and pelvic pain.  Musculoskeletal: Negative for joint swelling, arthralgias and gait problem.  Skin: Negative for rash.  Neurological: Negative for dizziness, syncope, speech difficulty, weakness, numbness and headaches.  Hematological: Negative for adenopathy.  Psychiatric/Behavioral: Negative for behavioral problems, dysphoric mood and agitation. The patient is not nervous/anxious.        Objective:   Physical Exam  Constitutional: She is oriented to person, place, and time. She appears well-developed and well-nourished.  HENT:  Head: Normocephalic.  Right Ear: External ear normal.  Left Ear: External ear  normal.  Mouth/Throat: Oropharynx is clear and moist.  Eyes: Conjunctivae and EOM are normal. Pupils are equal, round, and reactive to light.  Neck: Normal range of motion. Neck supple. No thyromegaly present.  Cardiovascular: Normal rate, regular rhythm, normal heart sounds and intact distal pulses.   Pulmonary/Chest: Effort normal and breath sounds normal.  Abdominal: Soft. Bowel sounds are normal. She exhibits no mass. There is no tenderness.  Musculoskeletal:  Discomfort and decreased range of motion right shoulder  Lymphadenopathy:    She has no cervical adenopathy.  Neurological: She is alert and oriented to person, place, and time.  Skin: Skin is warm and dry. No rash noted.  Psychiatric: She has a normal mood and affect. Her behavior is normal.          Assessment & Plan:   Right shoulder pain Osteopenia History mild reflux Unremarkable clinical exam  No contraindications to elective right shoulder surgery

## 2014-11-20 HISTORY — PX: SHOULDER ARTHROSCOPY W/ ROTATOR CUFF REPAIR: SHX2400

## 2014-11-21 DIAGNOSIS — S46211A Strain of muscle, fascia and tendon of other parts of biceps, right arm, initial encounter: Secondary | ICD-10-CM | POA: Diagnosis not present

## 2014-11-21 DIAGNOSIS — X58XXXA Exposure to other specified factors, initial encounter: Secondary | ICD-10-CM | POA: Diagnosis not present

## 2014-11-21 DIAGNOSIS — M1990 Unspecified osteoarthritis, unspecified site: Secondary | ICD-10-CM | POA: Diagnosis not present

## 2014-11-21 DIAGNOSIS — G8918 Other acute postprocedural pain: Secondary | ICD-10-CM | POA: Diagnosis not present

## 2014-11-21 DIAGNOSIS — M75121 Complete rotator cuff tear or rupture of right shoulder, not specified as traumatic: Secondary | ICD-10-CM | POA: Diagnosis not present

## 2014-11-21 DIAGNOSIS — Y929 Unspecified place or not applicable: Secondary | ICD-10-CM | POA: Diagnosis not present

## 2014-11-21 DIAGNOSIS — S46111A Strain of muscle, fascia and tendon of long head of biceps, right arm, initial encounter: Secondary | ICD-10-CM | POA: Diagnosis not present

## 2014-11-21 DIAGNOSIS — S43421A Sprain of right rotator cuff capsule, initial encounter: Secondary | ICD-10-CM | POA: Diagnosis not present

## 2014-11-29 DIAGNOSIS — M75121 Complete rotator cuff tear or rupture of right shoulder, not specified as traumatic: Secondary | ICD-10-CM | POA: Diagnosis not present

## 2014-11-29 DIAGNOSIS — Z4789 Encounter for other orthopedic aftercare: Secondary | ICD-10-CM | POA: Diagnosis not present

## 2014-12-05 ENCOUNTER — Ambulatory Visit: Payer: Medicare Other | Attending: Specialist | Admitting: Physical Therapy

## 2014-12-05 ENCOUNTER — Encounter: Payer: Self-pay | Admitting: Physical Therapy

## 2014-12-05 DIAGNOSIS — M25611 Stiffness of right shoulder, not elsewhere classified: Secondary | ICD-10-CM

## 2014-12-05 DIAGNOSIS — M7541 Impingement syndrome of right shoulder: Secondary | ICD-10-CM | POA: Insufficient documentation

## 2014-12-05 DIAGNOSIS — M25511 Pain in right shoulder: Secondary | ICD-10-CM

## 2014-12-05 NOTE — Therapy (Signed)
[facility] [place] [address] [place], [place], [address] Phone: [phone]   Fax:  [phone]  Physical Therapy Evaluation  Patient Details  Name: Nancy Neal MRN: [id number] Date of Birth: November 02, 1931 Referring Provider:  Eugenia Mcalpine, MD  Encounter Date: 12/05/2014      PT End of Session - 12/05/14 1107    Visit Number 1   Date for PT Re-Evaluation 02/03/15   PT Start Time 1050      Past Medical History  Diagnosis Date  . ALLERGIC RHINITIS 06/02/2007  . OSTEOPENIA 06/02/2007  . VAGINITIS, ATROPHIC, POSTMENOPAUSAL 12/06/2008  . VERTIGO 02/16/2009  . Glaucoma 1-12    Past Surgical History  Procedure Laterality Date  . Abdominal hysterectomy    . Eye surgery      for glaucoma    There were no vitals taken for this visit.  Visit Diagnosis:  Right shoulder pain  Stiffness of joint, shoulder region, right      Subjective Assessment - 12/05/14 1104    Symptoms S/P right RC repair, SAD/DCR on 11/21/14, is limited by pain and protocol   Limitations Writing;House hold activities;Other (comment)   Patient Stated Goals dress and do hair without pain   Currently in Pain? Yes   Pain Score 5    Pain Location Shoulder   Pain Orientation Right   Pain Descriptors / Indicators Aching;Tender;Throbbing;Tightness;Sore   Pain Type Surgical pain   Pain Onset 1 to 4 weeks ago   Pain Frequency Constant   Aggravating Factors  any motions or trying to reach   Pain Relieving Factors pain meds but reports difficulty with makeing her feel bad   Effect of Pain on Daily Activities everything ADL wise is limited   Multiple Pain Sites No          [facility] PT Assessment - 12/05/14 0001    Assessment   Medical Diagnosis s/p right RC repair with DCR and SAD   Onset Date 11/21/14   Next MD Visit 4 weeks   Precautions   Precaution Comments RC reapir protocol   Required Braces or Orthoses Sling   Balance Screen   Has the  patient fallen in the past 6 months No   Has the patient had a decrease in activity level because of a fear of falling?  No   Is the patient reluctant to leave their home because of a fear of falling?  No   Home Environment   Additional Comments lives alone at home   Prior Function   Leisure loves to play bridge, some yardwork and housework   Posture/Postural Control   Posture Comments in sling, gaurded shoulder   PROM   Overall PROM  Due to pain;Due to precautions   Overall PROM Comments gaurded and painful with PROM, very resistant to any motions unable to relax   Right Shoulder Flexion 20 Degrees   Right Shoulder ABduction 20 Degrees   Right Shoulder Internal Rotation 30 Degrees   Right Shoulder External Rotation 0 Degrees   Palpation   Palpation scars are healed, she is tender,, very tight and gaurded int he right upper trap and into the neck       ice x 15 minutes to right shoulder for pain in sitting                   PT Education - 12/05/14 1142    Education provided Yes   Education Details Shoulder shrugs, scapular  retraction, PROM table slide for flexion   Person(s) Educated Patient   Methods Explanation;Demonstration;Verbal cues;Tactile cues;Handout   Comprehension Verbalized understanding;Returned demonstration;Verbal cues required;Tactile cues required          PT [place] Term Goals - December 09, 2014 1148    PT [place] TERM GOAL #1   Title independent with HEP   Time 4   Period Weeks   Status New           PT Long Term Goals - 12-09-14 1148    PT LONG TERM GOAL #1   Title independent with HEP   Time 8   Period Weeks   Status New   PT LONG TERM GOAL #2   Title independent with RICE   Time 8   Period Weeks   Status New   PT LONG TERM GOAL #3   Title increase ROM of the right shoulder to 130 degrees flexion   Time 8   Period Weeks   Status New   PT LONG TERM GOAL #4   Title dress and do hair without difficulty   Time 8   Period Weeks    Status New               Plan - 12/09/2014 1143    Clinical Impression Statement Patient had an SAD/DCR and RC repair on the right, she is very gaurded with all motions and has the inability to relax.  She is severly limited in her ROM with pain.  She is an active person that will need to do her own housework and yardwork.   Pt will benefit from skilled therapeutic intervention in order to improve on the following deficits Decreased range of motion;Increased muscle spasms;Increased edema;Impaired flexibility;Pain;Postural dysfunction   Rehab Potential Good   Clinical Impairments Affecting Rehab Potential all ADLs   PT Frequency 3x / week   PT Duration 4 weeks   PT Treatment/Interventions Electrical Stimulation;Ultrasound;Moist Heat;Therapeutic exercise;Therapeutic activities;Neuromuscular re-education;Manual techniques;Patient/family education;Passive range of motion   PT Next Visit Plan assure her ability to do HEP, she had a lot of questions, begin PROM   PT Home Exercise Plan follow protocol   Consulted and Agree with Plan of Care Patient          G-Codes - 12/09/14 1150    Functional Assessment Tool Used FOTO   Functional Limitation Self care   Self Care Current Status (M0102) 100 percent impaired, limited or restricted   Self Care Goal Status (V2536) At least 40 percent but less than 60 percent impaired, limited or restricted       Problem List Patient Active Problem List   Diagnosis Date Noted  . DYSURIA 06/13/2010  . VERTIGO 02/16/2009  . VAGINITIS, ATROPHIC, POSTMENOPAUSAL 12/06/2008  . UTI 01/24/2008  . UPPER RESPIRATORY INFECTION, VIRAL 08/23/2007  . Allergic rhinitis 06/02/2007  . Disorder of bone and cartilage 06/02/2007    Jearld Lesch, PT 12/09/14, 11:54 AM  [facility] [place] [address] [place], [place], [address] Phone: [phone]   Fax:  [phone]

## 2014-12-07 ENCOUNTER — Ambulatory Visit: Payer: Medicare Other | Admitting: Physical Therapy

## 2014-12-07 ENCOUNTER — Encounter: Payer: Self-pay | Admitting: Physical Therapy

## 2014-12-07 DIAGNOSIS — M25611 Stiffness of right shoulder, not elsewhere classified: Secondary | ICD-10-CM

## 2014-12-07 DIAGNOSIS — M7541 Impingement syndrome of right shoulder: Secondary | ICD-10-CM | POA: Diagnosis not present

## 2014-12-07 DIAGNOSIS — M25511 Pain in right shoulder: Secondary | ICD-10-CM

## 2014-12-07 NOTE — Therapy (Signed)
[facility] [place] [address] [place], [place], [address] Phone: [phone]   Fax:  [phone]  Physical Therapy Treatment  Patient Details  Name: Nancy Neal MRN: [id number] Date of Birth: [birth year] Referring Provider:  Eugenia Mcalpine, MD  Encounter Date: 12/07/2014      PT End of Session - 12/07/14 1012    Visit Number 2      Past Medical History  Diagnosis Date  . ALLERGIC RHINITIS 06/02/2007  . OSTEOPENIA 06/02/2007  . VAGINITIS, ATROPHIC, POSTMENOPAUSAL 12/06/2008  . VERTIGO 02/16/2009  . Glaucoma 1-12    Past Surgical History  Procedure Laterality Date  . Abdominal hysterectomy    . Eye surgery      for glaucoma    There were no vitals taken for this visit.  Visit Diagnosis:  Stiffness of joint, shoulder region, right  Right shoulder pain      Subjective Assessment - 12/07/14 0933    Symptoms doing exercises at home and doing pretty good   Currently in Pain? Yes   Pain Score 7    Pain Location Shoulder   Pain Orientation Right                    [facility] Adult PT Treatment/Exercise - 12/07/14 0001    Exercises   Exercises Shoulder   Shoulder Exercises: Supine   Other Supine Exercises pulleys flexion and abd 15 times each assistance with abd   Other Supine Exercises cane ex ext and IR 15 times each   Shoulder Exercises: Seated   Other Seated Exercises ball rolling on mat flex/ext, CC and CW 10 times each   Cryotherapy   Number Minutes Cryotherapy 15 Minutes   Cryotherapy Location Shoulder   Manual Therapy   Manual Therapy Joint mobilization;Passive ROM   Passive ROM RT shld 3 sets 10 each direction                PT Education - 12/07/14 1011    Education provided Yes   Education Details educated on safety in shower without brace on   Person(s) Educated Patient   Methods Explanation;Demonstration   Comprehension Verbalized understanding;Returned demonstration           PT Balbi Term Goals - 12/07/14 1020    PT Pe[place]y TERM GOAL #1   Title independent with HEP   Time 4   Period Weeks   Status Achieved           PT Long Term Goals - 12/07/14 1020    PT LONG TERM GOAL #1   Title independent with HEP   Time 8   Period Weeks   Status On-going   PT LONG TERM GOAL #2   Title independent with RICE   Time 8   Period Weeks   Status Achieved   PT LONG TERM GOAL #3   Title increase ROM of the right shoulder to 130 degrees flexion   Time 8   Period Weeks   Status On-going   PT LONG TERM GOAL #4   Title dress and do hair without difficulty   Time 8   Period Weeks   Status On-going               Plan - 12/07/14 1013    Clinical Impression Statement pt much more relaxed and less fearful of mvmt today. Tolerated PROM ex with guideance and assistance. Pt tolerated MT with minimal pain and guarding,passive flex and  abd 80-90 degress visually        Problem List Patient Active Problem List   Diagnosis Date Noted  . DYSURIA 06/13/2010  . VERTIGO 02/16/2009  . VAGINITIS, ATROPHIC, POSTMENOPAUSAL 12/06/2008  . UTI 01/24/2008  . UPPER RESPIRATORY INFECTION, VIRAL 08/23/2007  . Allergic rhinitis 06/02/2007  . Disorder of bone and cartilage 06/02/2007    PAYSEUR,ANGIE PTA 12/07/2014, 10:22 AM  [facility] [place] [address] [place], [place], [address] Phone: [phone]   Fax:  [phone]

## 2014-12-07 NOTE — Therapy (Signed)
[facility] [place] [address] [place], [place], [address] Phone: [phone]   Fax:  [phone]  Physical Therapy Treatment  Patient Details  Name: Nancy Neal MRN: [id number] Date of Birth: [birth year] Referring Provider:  Eugenia Mcalpine, MD  Encounter Date: 12/07/2014      PT End of Session - 12/07/14 1012    Visit Number 2   PT Start Time 1324   PT Stop Time 1020   PT Time Calculation (min) 55 min      Past Medical History  Diagnosis Date  . ALLERGIC RHINITIS 06/02/2007  . OSTEOPENIA 06/02/2007  . VAGINITIS, ATROPHIC, POSTMENOPAUSAL 12/06/2008  . VERTIGO 02/16/2009  . Glaucoma 1-12    Past Surgical History  Procedure Laterality Date  . Abdominal hysterectomy    . Eye surgery      for glaucoma    There were no vitals taken for this visit.  Visit Diagnosis:  Stiffness of joint, shoulder region, right  Right shoulder pain      Subjective Assessment - 12/07/14 0933    Symptoms doing exercises at home and doing pretty good   Currently in Pain? Yes   Pain Score 7    Pain Location Shoulder   Pain Orientation Right                    [facility] Adult PT Treatment/Exercise - 12/07/14 0001    Exercises   Exercises Shoulder   Shoulder Exercises: Supine   Other Supine Exercises pulleys flexion and abd 15 times each assistance with abd   Other Supine Exercises cane ex ext and IR 15 times each   Shoulder Exercises: Seated   Other Seated Exercises ball rolling on mat flex/ext, CC and CW 10 times each   Cryotherapy   Number Minutes Cryotherapy 15 Minutes   Cryotherapy Location Shoulder   Manual Therapy   Manual Therapy Joint mobilization;Passive ROM   Passive ROM RT shld 3 sets 10 each direction                PT Education - 12/07/14 1011    Education provided Yes   Education Details educated on safety in shower without brace on   Person(s) Educated Patient   Methods  Explanation;Demonstration   Comprehension Verbalized understanding;Returned demonstration          PT [place] Term Goals - 12/07/14 1020    PT Gangl TERM GOAL #1   Title independent with HEP   Time 4   Period Weeks   Status Achieved           PT Long Term Goals - 12/07/14 1020    PT LONG TERM GOAL #1   Title independent with HEP   Time 8   Period Weeks   Status On-going   PT LONG TERM GOAL #2   Title independent with RICE   Time 8   Period Weeks   Status Achieved   PT LONG TERM GOAL #3   Title increase ROM of the right shoulder to 130 degrees flexion   Time 8   Period Weeks   Status On-going   PT LONG TERM GOAL #4   Title dress and do hair without difficulty   Time 8   Period Weeks   Status On-going               Plan - 12/07/14 1013    Clinical Impression Statement pt much more relaxed and less fearful  of mvmt today. Tolerated PROM ex with guideance and assistance. Pt tolerated MT with minimal pain and guarding,passive flex and abd 80-90 degress visually        Problem List Patient Active Problem List   Diagnosis Date Noted  . DYSURIA 06/13/2010  . VERTIGO 02/16/2009  . VAGINITIS, ATROPHIC, POSTMENOPAUSAL 12/06/2008  . UTI 01/24/2008  . UPPER RESPIRATORY INFECTION, VIRAL 08/23/2007  . Allergic rhinitis 06/02/2007  . Disorder of bone and cartilage 06/02/2007    Jermany Sundell,ANGIE 12/07/2014, 10:35 AM  [facility] [place] [address] [place], [place], [address] Phone: [phone]   Fax:  [phone]

## 2014-12-07 NOTE — Patient Instructions (Signed)
Pt verb doing HEP

## 2014-12-11 ENCOUNTER — Ambulatory Visit: Payer: Medicare Other | Admitting: Physical Therapy

## 2014-12-11 ENCOUNTER — Encounter: Payer: Self-pay | Admitting: Physical Therapy

## 2014-12-11 DIAGNOSIS — M25511 Pain in right shoulder: Secondary | ICD-10-CM

## 2014-12-11 DIAGNOSIS — M25611 Stiffness of right shoulder, not elsewhere classified: Secondary | ICD-10-CM

## 2014-12-11 DIAGNOSIS — M7541 Impingement syndrome of right shoulder: Secondary | ICD-10-CM | POA: Diagnosis not present

## 2014-12-11 NOTE — Therapy (Signed)
[facility] [place] [address] [place], [place], [address] Phone: [phone]   Fax:  [phone]  Physical Therapy Treatment  Patient Details  Name: Nancy Neal MRN: [id number] Date of Birth: [birth year] Referring Provider:  Eugenia Mcalpine, MD  Encounter Date: 12/11/2014      PT End of Session - 12/11/14 0838    Visit Number 3   PT Start Time 0800   PT Stop Time 0900   PT Time Calculation (min) 60 min      Past Medical History  Diagnosis Date  . ALLERGIC RHINITIS 06/02/2007  . OSTEOPENIA 06/02/2007  . VAGINITIS, ATROPHIC, POSTMENOPAUSAL 12/06/2008  . VERTIGO 02/16/2009  . Glaucoma 1-12    Past Surgical History  Procedure Laterality Date  . Abdominal hysterectomy    . Eye surgery      for glaucoma    There were no vitals taken for this visit.  Visit Diagnosis:  Right shoulder pain  Stiffness of joint, shoulder region, right      Subjective Assessment - 12/11/14 0803    Symptoms increased pain after last session, eased off now   Currently in Pain? Yes   Pain Score 5    Pain Location Shoulder   Pain Orientation Right   Multiple Pain Sites No                    [facility] Adult PT Treatment/Exercise - 12/11/14 0001    Elbow Exercises   Elbow Flexion Strengthening;Right;10 reps;Standing;Bar weights/barbell   Shoulder Exercises: Supine   Other Supine Exercises pulleys flexion and abd 15 times each assistance with abd   Other Supine Exercises cane ex ER,ext and IR 15 times each   Shoulder Exercises: Seated   Elevation Weight (lbs) 4# shruggs 10 times   Other Seated Exercises ball rolling on mat flex/ext, CC and CW 15 times each   Modalities   Modalities Electrical Stimulation   Cryotherapy   Number Minutes Cryotherapy 15 Minutes   Cryotherapy Location Shoulder   Type of Cryotherapy Ice pack   Electrical Stimulation   Electrical Stimulation Location RT shld  RT shoulder   Electrical  Stimulation Parameters IFC   Electrical Stimulation Goals Pain   Manual Therapy   Manual Therapy Joint mobilization;Passive ROM   Passive ROM 3 sets 10 Rt shoulder supine all directions                  PT [place] Term Goals - 12/07/14 1020    PT Rump TERM GOAL #1   Title independent with HEP   Time 4   Period Weeks   Status Achieved           PT Long Term Goals - 12/11/14 0841    PT LONG TERM GOAL #2   Status Achieved               Plan - 12/11/14 1324    Clinical Impression Statement pt still with trouble relaxing and fearful of mvmt and pain, pt states pain meds make her sick to her stomach so only advil,rec trying muscles relaxer, significant improvement in PROM from eval        Problem List Patient Active Problem List   Diagnosis Date Noted  . DYSURIA 06/13/2010  . VERTIGO 02/16/2009  . VAGINITIS, ATROPHIC, POSTMENOPAUSAL 12/06/2008  . UTI 01/24/2008  . UPPER RESPIRATORY INFECTION, VIRAL 08/23/2007  . Allergic rhinitis 06/02/2007  . Disorder of bone and cartilage 06/02/2007  PAYSEUR,ANGIE,PTA 12/11/2014, 8:43 AM  [facility] [place] [address] [place], [place], [address] Phone: [phone]   Fax:  [phone]

## 2014-12-13 ENCOUNTER — Encounter: Payer: Self-pay | Admitting: Physical Therapy

## 2014-12-13 ENCOUNTER — Ambulatory Visit: Payer: Medicare Other | Admitting: Physical Therapy

## 2014-12-13 DIAGNOSIS — M7541 Impingement syndrome of right shoulder: Secondary | ICD-10-CM | POA: Diagnosis not present

## 2014-12-13 DIAGNOSIS — M25511 Pain in right shoulder: Secondary | ICD-10-CM

## 2014-12-13 DIAGNOSIS — M25611 Stiffness of right shoulder, not elsewhere classified: Secondary | ICD-10-CM

## 2014-12-13 NOTE — Therapy (Signed)
[facility] [place] [facility] [address] [place], [place], [address] Phone: [phone]   Fax:  [phone]  Physical Therapy Treatment  Patient Details  Name: Nancy Neal MRN: [id number] Date of Birth: 1932-05-05 Referring Provider:  Eugenia Mcalpine, MD  Encounter Date: 12/13/2014    Past Medical History  Diagnosis Date  . ALLERGIC RHINITIS 06/02/2007  . OSTEOPENIA 06/02/2007  . VAGINITIS, ATROPHIC, POSTMENOPAUSAL 12/06/2008  . VERTIGO 02/16/2009  . Glaucoma 1-12    Past Surgical History  Procedure Laterality Date  . Abdominal hysterectomy    . Eye surgery      for glaucoma    There were no vitals taken for this visit.  Visit Diagnosis:  Right shoulder pain  Stiffness of joint, shoulder region, right      Subjective Assessment - 12/13/14 0853    Symptoms Reports that she is taking more Advil to tolerate PT treatment   Limitations Writing;House hold activities;Other (comment)   Patient Stated Goals dress and do hair without pain   Currently in Pain? Yes   Pain Score 6    Pain Location Shoulder   Pain Orientation Right   Pain Descriptors / Indicators Aching;Constant;Tender;Sore   Pain Type Surgical pain   Pain Onset 1 to 4 weeks ago   Aggravating Factors  any activity   Pain Relieving Factors pain meds rest, sling                    [facility] Adult PT Treatment/Exercise - 12/13/14 0001    Elbow Exercises   Elbow Flexion Strengthening;Right;10 reps;Standing;Bar weights/barbell   Shoulder Exercises: Supine   Other Supine Exercises pulleys flexion and abd 15 times each assistance with abd   Other Supine Exercises cane ex ER,ext and IR 15 times each   Shoulder Exercises: Seated   Elevation Weight (lbs) 4# shruggs 10 times   Retraction AROM;10 reps   Other Seated Exercises ball rolling on mat flex/ext, CC and CW 15 times each   Other Seated Exercises All two finger isometrics x 10 reps each   Shoulder  Exercises: ROM/Strengthening   UBE (Upper Arm Bike) x 3 minnutes   Modalities   Modalities Electrical Stimulation   Cryotherapy   Number Minutes Cryotherapy 15 Minutes   Cryotherapy Location Shoulder   Type of Cryotherapy Other (comment)  vasopneumatic   Electrical Stimulation   Electrical Stimulation Location RT shld   Electrical Stimulation Parameters IFC   Electrical Stimulation Goals Pain   Manual Therapy   Manual Therapy Joint mobilization;Passive ROM   Passive ROM all passive motions in supine to pain range                  PT Dietz Term Goals - 12/07/14 1020    PT [place]art TERM GOAL #1   Title independent with HEP   Time 4   Period Weeks   Status Achieved           PT Long Term Goals - 12/11/14 0841    PT LONG TERM GOAL #2   Status Achieved               Problem List Patient Active Problem List   Diagnosis Date Noted  . DYSURIA 06/13/2010  . VERTIGO 02/16/2009  . VAGINITIS, ATROPHIC, POSTMENOPAUSAL 12/06/2008  . UTI 01/24/2008  . UPPER RESPIRATORY INFECTION, VIRAL 08/23/2007  . Allergic rhinitis 06/02/2007  . Disorder of bone and cartilage 06/02/2007    Lydia Meng W,PT 12/13/2014, 9:34  [place], [place], [address] Phone: [phone]   Fax:  [phone]

## 2014-12-14 ENCOUNTER — Ambulatory Visit: Payer: Medicare Other | Admitting: Physical Therapy

## 2014-12-19 ENCOUNTER — Ambulatory Visit: Payer: Medicare Other | Attending: Specialist | Admitting: Physical Therapy

## 2014-12-19 ENCOUNTER — Encounter: Payer: Self-pay | Admitting: Physical Therapy

## 2014-12-19 DIAGNOSIS — M7541 Impingement syndrome of right shoulder: Secondary | ICD-10-CM | POA: Insufficient documentation

## 2014-12-19 DIAGNOSIS — M25511 Pain in right shoulder: Secondary | ICD-10-CM

## 2014-12-19 DIAGNOSIS — M25611 Stiffness of right shoulder, not elsewhere classified: Secondary | ICD-10-CM

## 2014-12-19 NOTE — Therapy (Signed)
[facility] [place] [facility] [address] [place], [place], [address] Phone: [phone]   Fax:  [phone]  Physical Therapy Treatment  Patient Details  Name: Nancy Neal MRN: [id number] Date of Birth: 11/14/1931 Referring Provider:  Eugenia Mcalpine, MD  Encounter Date: 12/19/2014      PT End of Session - 12/19/14 0858    Visit Number 5   Date for PT Re-Evaluation 02/03/15   PT Start Time 0806   PT Stop Time 0859   PT Time Calculation (min) 53 min   Activity Tolerance Patient limited by pain      Past Medical History  Diagnosis Date  . ALLERGIC RHINITIS 06/02/2007  . OSTEOPENIA 06/02/2007  . VAGINITIS, ATROPHIC, POSTMENOPAUSAL 12/06/2008  . VERTIGO 02/16/2009  . Glaucoma 1-12    Past Surgical History  Procedure Laterality Date  . Abdominal hysterectomy    . Eye surgery      for glaucoma    There were no vitals taken for this visit.  Visit Diagnosis:  Right shoulder pain  Stiffness of joint, shoulder region, right      Subjective Assessment - 12/19/14 0806    Symptoms Had a bad night last night.   Currently in Pain? Yes   Pain Score 5    Pain Location Shoulder   Pain Orientation Right   Pain Descriptors / Indicators Aching   Pain Type Surgical pain   Pain Onset 1 to 4 weeks ago   Pain Frequency Constant   Multiple Pain Sites No          [facility] PT Assessment - 12/19/14 0001    ROM / Strength   AROM / PROM / Strength --   AROM   AROM Assessment Site --   Right/Left Shoulder --   PROM   Right/Left Shoulder Right   Right Shoulder Flexion 113 Degrees                  [facility] Adult PT Treatment/Exercise - 12/19/14 0001    Exercises   Exercises Shoulder   Shoulder Exercises: Pulleys   Flexion 3 minutes   Shoulder Exercises: Therapy Ball   Flexion 10 reps   Other Therapy Ball Exercises clockwise and counterclockwise  10 each   Shoulder Exercises: ROM/Strengthening   UBE (Upper Arm Bike) 4  minutes   Shoulder Exercises: Stretch   Other Shoulder Stretches dow rod  ER, IR, extension   Modalities   Modalities Cryotherapy;Electrical Stimulation   Cryotherapy   Number Minutes Cryotherapy 15 Minutes   Cryotherapy Location Shoulder   Type of Cryotherapy Other (comment)  vaso   Electrical Stimulation   Electrical Stimulation Location RT shld   Electrical Stimulation Parameters IFC   Electrical Stimulation Goals Pain   Manual Therapy   Manual Therapy Passive ROM   Passive ROM all passive motions in supine to pain range                  PT Gebert Term Goals - 12/07/14 1020    PT Lejeune TERM GOAL #1   Title independent with HEP   Time 4   Period Weeks   Status Achieved           PT Long Term Goals - 12/19/14 0847    PT LONG TERM GOAL #1   Title independent with HEP   Time 8   Status On-going   PT LONG TERM GOAL #2   Title independent with RICE  Time 8   Period Weeks   Status Achieved   PT LONG TERM GOAL #3   Title increase ROM of the right shoulder to 130 degrees flexion   Time 8   Period Weeks   Status On-going   PT LONG TERM GOAL #4   Title dress and do hair without difficulty   Time 8   Period Weeks   Status On-going               Plan - 12/19/14 0845    Clinical Impression Statement Progressing well with protocol.  Has trouble relaxing.   Pt will benefit from skilled therapeutic intervention in order to improve on the following deficits Decreased range of motion;Increased muscle spasms;Increased edema;Impaired flexibility;Pain;Postural dysfunction   Rehab Potential Good   Clinical Impairments Affecting Rehab Potential all ADLs   PT Frequency 3x / week   PT Duration 4 weeks   PT Treatment/Interventions Electrical Stimulation;Ultrasound;Moist Heat;Therapeutic exercise;Therapeutic activities;Neuromuscular re-education;Manual techniques;Patient/family education;Passive range of motion   PT Next Visit Plan assure her ability to do HEP,  she had a lot of questions, begin PROM   PT Home Exercise Plan follow protocol   Consulted and Agree with Plan of Care Patient        Problem List Patient Active Problem List   Diagnosis Date Noted  . DYSURIA 06/13/2010  . VERTIGO 02/16/2009  . VAGINITIS, ATROPHIC, POSTMENOPAUSAL 12/06/2008  . UTI 01/24/2008  . UPPER RESPIRATORY INFECTION, VIRAL 08/23/2007  . Allergic rhinitis 06/02/2007  . Disorder of bone and cartilage 06/02/2007    Caleyah Jr PTA 12/19/2014, 9:00 AM  [facility] [place] [facility] [address] [place], [place], [address] Phone: [phone]   Fax:  [phone]

## 2014-12-21 ENCOUNTER — Encounter: Payer: Self-pay | Admitting: Physical Therapy

## 2014-12-21 ENCOUNTER — Ambulatory Visit: Payer: Medicare Other | Admitting: Physical Therapy

## 2014-12-21 DIAGNOSIS — M7541 Impingement syndrome of right shoulder: Secondary | ICD-10-CM | POA: Diagnosis not present

## 2014-12-21 DIAGNOSIS — M25611 Stiffness of right shoulder, not elsewhere classified: Secondary | ICD-10-CM

## 2014-12-21 DIAGNOSIS — M25511 Pain in right shoulder: Secondary | ICD-10-CM

## 2014-12-21 NOTE — Therapy (Signed)
[facility] [place] [facility] [address] [place], [place], [address] Phone: [phone]   Fax:  [phone]  Physical Therapy Treatment  Patient Details  Name: Nancy Neal MRN: [id number] Date of Birth: 05-25-1932 Referring Provider:  Eugenia Mcalpine, MD  Encounter Date: 12/21/2014      PT End of Session - 12/21/14 0924    Visit Number 6   PT Start Time 0847   PT Stop Time 0950   PT Time Calculation (min) 63 min      Past Medical History  Diagnosis Date  . ALLERGIC RHINITIS 06/02/2007  . OSTEOPENIA 06/02/2007  . VAGINITIS, ATROPHIC, POSTMENOPAUSAL 12/06/2008  . VERTIGO 02/16/2009  . Glaucoma 1-12    Past Surgical History  Procedure Laterality Date  . Abdominal hysterectomy    . Eye surgery      for glaucoma    There were no vitals taken for this visit.  Visit Diagnosis:  Right shoulder pain  Stiffness of joint, shoulder region, right      Subjective Assessment - 12/21/14 0942    Symptoms getting better overall, seeing MD monday   Limitations Writing;House hold activities;Other (comment)   Patient Stated Goals dress and do hair without pain   Currently in Pain? Yes   Pain Score 4    Pain Location Shoulder   Pain Orientation Right   Pain Type Surgical pain   Pain Onset 1 to 4 weeks ago          [facility] PT Assessment - 12/21/14 0001    PROM   Right/Left Shoulder --  RT PROM SUPINE   Right Shoulder Flexion 155 Degrees   Right Shoulder ABduction 130 Degrees   Right Shoulder Internal Rotation 82 Degrees   Right Shoulder External Rotation 44 Degrees        Starting week of 3/7 start AA to AROM then PRE's in 2 week pt not taking any meds d/t getting sick and is very pain limited, pt to discuss possible TRAMADOL with MD           [facility] Adult PT Treatment/Exercise - 12/21/14 0001    Shoulder Exercises: ROM/Strengthening   UBE (Upper Arm Bike) 2 min fwd/2 back RT AA, mostly done with LEFT UE   Other  ROM/Strengthening Exercises yellow tband shld ext,retraction and bicep curl 15 times   Other ROM/Strengthening Exercises 4# shruggs and backward rolls 15 times each standing   Shoulder Exercises: Isometric Strengthening   Flexion 5X10"   Extension 5X10"   External Rotation 5X10"   Internal Rotation 5X10"   ABduction 5X10"   Shoulder Exercises: Stretch   Wall Stretch - Flexion 5 reps;10 seconds   Wall Stretch - ABduction 5 reps;10 seconds   Other Shoulder Stretches dow rod  ER, IR, extension, flexion and chest press 15 times each   Other Shoulder Stretches finger ladder 8 times flexion and abd   Modalities   Modalities Cryotherapy;Electrical Stimulation   Cryotherapy   Number Minutes Cryotherapy 15 Minutes   Cryotherapy Location --  shoulder   Type of Cryotherapy Other (comment)  VASO   Electrical Stimulation   Electrical Stimulation Location RT shld   Electrical Stimulation Parameters IFC   Electrical Stimulation Goals Pain   Manual Therapy   Manual Therapy Passive ROM   Passive ROM all passive motions in supine to pain rangewith measurements taken for MD note  PT Dempsey Term Goals - 12/07/14 1020    PT Holleman TERM GOAL #1   Title independent with HEP   Time 4   Period Weeks   Status Achieved           PT Long Term Goals - 12/19/14 0847    PT LONG TERM GOAL #1   Title independent with HEP   Time 8   Status On-going   PT LONG TERM GOAL #2   Title independent with RICE   Time 8   Period Weeks   Status Achieved   PT LONG TERM GOAL #3   Title increase ROM of the right shoulder to 130 degrees flexion   Time 8   Period Weeks   Status On-going   PT LONG TERM GOAL #4   Title dress and do hair without difficulty   Time 8   Period Weeks   Status On-going               Plan - 12/21/14 0925    Clinical Impression Statement Pt still pain limited but less guarding. Pt unable to take pain meds.        Problem List Patient  Active Problem List   Diagnosis Date Noted  . DYSURIA 06/13/2010  . VERTIGO 02/16/2009  . VAGINITIS, ATROPHIC, POSTMENOPAUSAL 12/06/2008  . UTI 01/24/2008  . UPPER RESPIRATORY INFECTION, VIRAL 08/23/2007  . Allergic rhinitis 06/02/2007  . Disorder of bone and cartilage 06/02/2007   Otelia Santee PT Stpehen Petitjean,ANGIE,PTA 12/21/2014, 9:47 AM  [facility] [place] [facility] [address] [place], [place], [address] Phone: [phone]   Fax:  [phone]

## 2014-12-21 NOTE — Patient Instructions (Signed)
Pt to take MD note with her for 3/7 appt Have signed and returned

## 2014-12-25 DIAGNOSIS — M19011 Primary osteoarthritis, right shoulder: Secondary | ICD-10-CM | POA: Diagnosis not present

## 2014-12-25 DIAGNOSIS — M75121 Complete rotator cuff tear or rupture of right shoulder, not specified as traumatic: Secondary | ICD-10-CM | POA: Diagnosis not present

## 2014-12-25 DIAGNOSIS — Z4789 Encounter for other orthopedic aftercare: Secondary | ICD-10-CM | POA: Diagnosis not present

## 2014-12-27 ENCOUNTER — Encounter: Payer: Self-pay | Admitting: Physical Therapy

## 2014-12-27 ENCOUNTER — Ambulatory Visit: Payer: Medicare Other | Admitting: Physical Therapy

## 2014-12-27 DIAGNOSIS — M25611 Stiffness of right shoulder, not elsewhere classified: Secondary | ICD-10-CM

## 2014-12-27 DIAGNOSIS — M7541 Impingement syndrome of right shoulder: Secondary | ICD-10-CM | POA: Diagnosis not present

## 2014-12-27 DIAGNOSIS — M25511 Pain in right shoulder: Secondary | ICD-10-CM

## 2014-12-27 NOTE — Therapy (Signed)
[facility] [place] [facility] [address] [place], [place], [address] Phone: [phone]   Fax:  [phone]  Physical Therapy Treatment  Patient Details  Name: Nancy Neal MRN: [id number] Date of Birth: 1932-04-13 Referring Provider:  Eugenia Mcalpine, MD  Encounter Date: 12/27/2014      PT End of Session - 12/27/14 0927    Visit Number 7   Date for PT Re-Evaluation 02/03/15   PT Start Time 0851   PT Stop Time 0940   PT Time Calculation (min) 49 min   Activity Tolerance Patient limited by pain   Behavior During Therapy [facility] for tasks assessed/performed      Past Medical History  Diagnosis Date  . ALLERGIC RHINITIS 06/02/2007  . OSTEOPENIA 06/02/2007  . VAGINITIS, ATROPHIC, POSTMENOPAUSAL 12/06/2008  . VERTIGO 02/16/2009  . Glaucoma 1-12    Past Surgical History  Procedure Laterality Date  . Abdominal hysterectomy    . Eye surgery      for glaucoma    There were no vitals taken for this visit.  Visit Diagnosis:  Right shoulder pain  Stiffness of joint, shoulder region, right      Subjective Assessment - 12/27/14 0852    Currently in Pain? Yes   Pain Score 5    Pain Location Shoulder   Pain Orientation Right   Pain Descriptors / Indicators Aching   Pain Type Surgical pain   Pain Onset 1 to 4 weeks ago   Multiple Pain Sites No          [facility] PT Assessment - 12/27/14 0001    ROM / Strength   AROM / PROM / Strength PROM   PROM   Right/Left Shoulder Right   Right Shoulder Flexion 155 Degrees   Right Shoulder ABduction 130 Degrees   Right Shoulder Internal Rotation 82 Degrees   Right Shoulder External Rotation 44 Degrees                  [facility] Adult PT Treatment/Exercise - 12/27/14 0001    Exercises   Exercises Shoulder   Shoulder Exercises: Pulleys   Flexion 2 minutes   Shoulder Exercises: ROM/Strengthening   UBE (Upper Arm Bike) 4 minutes  39fwd/2bk AAROM   Shoulder Exercises: Isometric  Strengthening   Flexion 5X10"   Extension 5X10"   External Rotation 5X10"   Internal Rotation 5X10"   ABduction 5X10"   Shoulder Exercises: Stretch   Other Shoulder Stretches dow rod  ext, ER, IR   Modalities   Modalities Cryotherapy;Electrical Stimulation   Cryotherapy   Number Minutes Cryotherapy 15 Minutes   Cryotherapy Location Shoulder  right   Type of Cryotherapy Other (comment)  vaso   Electrical Stimulation   Electrical Stimulation Location RT shld   Electrical Stimulation Parameters IFC   Electrical Stimulation Goals Pain   Manual Therapy   Manual Therapy Passive ROM   Passive ROM all passive motions in supine to pain rangewith measurements taken for MD note                  PT [place] Term Goals - 12/07/14 1020    PT [place] TERM GOAL #1   Title independent with HEP   Time 4   Period Weeks   Status Achieved           PT Long Term Goals - 12/19/14 1314    PT LONG TERM GOAL #1   Title independent with HEP   Time 8  Status On-going   PT LONG TERM GOAL #2   Title independent with RICE   Time 8   Period Weeks   Status Achieved   PT LONG TERM GOAL #3   Title increase ROM of the right shoulder to 130 degrees flexion   Time 8   Period Weeks   Status On-going   PT LONG TERM GOAL #4   Title dress and do hair without difficulty   Time 8   Period Weeks   Status On-going               Plan - 12/27/14 1610    Clinical Impression Statement Progressing well with protocol.  Only taking pain meds before therapy.   Pt will benefit from skilled therapeutic intervention in order to improve on the following deficits Decreased range of motion;Increased muscle spasms;Increased edema;Impaired flexibility;Pain;Postural dysfunction   Rehab Potential Good   Clinical Impairments Affecting Rehab Potential all ADLs   PT Frequency 3x / week   PT Duration 4 weeks   PT Treatment/Interventions Electrical Stimulation;Ultrasound;Moist Heat;Therapeutic  exercise;Therapeutic activities;Neuromuscular re-education;Manual techniques;Patient/family education;Passive range of motion   PT Next Visit Plan assure her ability to do HEP, she had a lot of questions, begin PROM   PT Home Exercise Plan follow protocol   Consulted and Agree with Plan of Care Patient        Problem List Patient Active Problem List   Diagnosis Date Noted  . DYSURIA 06/13/2010  . VERTIGO 02/16/2009  . VAGINITIS, ATROPHIC, POSTMENOPAUSAL 12/06/2008  . UTI 01/24/2008  . UPPER RESPIRATORY INFECTION, VIRAL 08/23/2007  . Allergic rhinitis 06/02/2007  . Disorder of bone and cartilage 06/02/2007    Braelee Herrle PTA 12/27/2014, 9:28 AM  [facility] [place] [facility] [address] [place], [place], [address] Phone: [phone]   Fax:  [phone]

## 2014-12-29 ENCOUNTER — Encounter: Payer: Self-pay | Admitting: Physical Therapy

## 2014-12-29 ENCOUNTER — Ambulatory Visit: Payer: Medicare Other | Admitting: Physical Therapy

## 2014-12-29 DIAGNOSIS — M25511 Pain in right shoulder: Secondary | ICD-10-CM

## 2014-12-29 DIAGNOSIS — M25611 Stiffness of right shoulder, not elsewhere classified: Secondary | ICD-10-CM

## 2014-12-29 DIAGNOSIS — M7541 Impingement syndrome of right shoulder: Secondary | ICD-10-CM | POA: Diagnosis not present

## 2014-12-29 NOTE — Therapy (Signed)
[facility] [place] [facility] [address] [place], [place], [address] Phone: [phone]   Fax:  [phone]  Physical Therapy Treatment  Patient Details  Name: Nancy Neal MRN: [id number] Date of Birth: 07/10/1932 Referring Provider:  Eugenia Mcalpine, MD  Encounter Date: 12/29/2014      PT End of Session - 12/29/14 0928    Visit Number 8   Date for PT Re-Evaluation 02/03/15   PT Start Time 0850   PT Stop Time 0942   PT Time Calculation (min) 52 min   Activity Tolerance Patient limited by pain;Patient tolerated treatment well   Behavior During Therapy [facility] for tasks assessed/performed      Past Medical History  Diagnosis Date  . ALLERGIC RHINITIS 06/02/2007  . OSTEOPENIA 06/02/2007  . VAGINITIS, ATROPHIC, POSTMENOPAUSAL 12/06/2008  . VERTIGO 02/16/2009  . Glaucoma 1-12    Past Surgical History  Procedure Laterality Date  . Abdominal hysterectomy    . Eye surgery      for glaucoma    There were no vitals filed for this visit.  Visit Diagnosis:  Right shoulder pain  Stiffness of joint, shoulder region, right      Subjective Assessment - 12/29/14 0856    Symptoms Feeling better, but I still get these sharp pains.   Currently in Pain? Yes   Pain Score 4    Pain Location Shoulder   Pain Orientation Right   Pain Descriptors / Indicators Shooting;Aching   Pain Frequency Occasional  constant aching, occasional shooting pain   Multiple Pain Sites No                       [facility] Adult PT Treatment/Exercise - 12/29/14 0001    Exercises   Exercises Shoulder   Shoulder Exercises: Supine   Other Supine Exercises dow rod AAROM  flex, ER 10 reps each   Shoulder Exercises: Standing   Other Standing Exercises dow rod  extension   Shoulder Exercises: ROM/Strengthening   UBE (Upper Arm Bike) 4 minutes  59fwd/2bk   Other ROM/Strengthening Exercises ball roll on mat (fwd/bk, cw, ccw)  10 reps each   Shoulder  Exercises: Isometric Strengthening   Flexion 5X10"   Extension 5X10"   External Rotation 5X10"   Internal Rotation 5X10"   ABduction 5X10"   Modalities   Modalities Cryotherapy;Electrical Stimulation   Cryotherapy   Number Minutes Cryotherapy 15 Minutes   Cryotherapy Location Shoulder   Type of Cryotherapy Ice pack   Electrical Stimulation   Electrical Stimulation Location RT shld   Electrical Stimulation Parameters IFC   Electrical Stimulation Goals Pain   Manual Therapy   Manual Therapy Passive ROM   Passive ROM all passive motions in supine to pain rangewith measurements taken for MD note                  PT [place] Term Goals - 12/07/14 1020    PT [place] TERM GOAL #1   Title independent with HEP   Time 4   Period Weeks   Status Achieved           PT Long Term Goals - 12/19/14 0847    PT LONG TERM GOAL #1   Title independent with HEP   Time 8   Status On-going   PT LONG TERM GOAL #2   Title independent with RICE   Time 8   Period Weeks   Status Achieved   PT LONG  TERM GOAL #3   Title increase ROM of the right shoulder to 130 degrees flexion   Time 8   Period Weeks   Status On-going   PT LONG TERM GOAL #4   Title dress and do hair without difficulty   Time 8   Period Weeks   Status On-going               Problem List Patient Active Problem List   Diagnosis Date Noted  . DYSURIA 06/13/2010  . VERTIGO 02/16/2009  . VAGINITIS, ATROPHIC, POSTMENOPAUSAL 12/06/2008  . UTI 01/24/2008  . UPPER RESPIRATORY INFECTION, VIRAL 08/23/2007  . Allergic rhinitis 06/02/2007  . Disorder of bone and cartilage 06/02/2007    Nancy Neal PTA 12/29/2014, 9:29 AM  [facility] [place] [facility] [address] [place], [place], [address] Phone: [phone]   Fax:  [phone]

## 2015-01-01 ENCOUNTER — Ambulatory Visit: Payer: Medicare Other | Admitting: Physical Therapy

## 2015-01-01 ENCOUNTER — Encounter: Payer: Self-pay | Admitting: Physical Therapy

## 2015-01-01 DIAGNOSIS — M25511 Pain in right shoulder: Secondary | ICD-10-CM

## 2015-01-01 DIAGNOSIS — M25611 Stiffness of right shoulder, not elsewhere classified: Secondary | ICD-10-CM

## 2015-01-01 DIAGNOSIS — M7541 Impingement syndrome of right shoulder: Secondary | ICD-10-CM | POA: Diagnosis not present

## 2015-01-01 NOTE — Therapy (Signed)
[facility] [place] [facility] [address] [place], [place], [address] Phone: [phone]   Fax:  [phone]  Physical Therapy Treatment  Patient Details  Name: Nancy Neal MRN: [id number] Date of Birth: 1932/05/18 Referring Provider:  Eugenia Mcalpine, MD  Encounter Date: 01/01/2015      PT End of Session - 01/01/15 0836    Visit Number 9   Date for PT Re-Evaluation 02/03/15   PT Start Time 0800   PT Stop Time 0905   PT Time Calculation (min) 65 min   Behavior During Therapy [facility] for tasks assessed/performed      Past Medical History  Diagnosis Date  . ALLERGIC RHINITIS 06/02/2007  . OSTEOPENIA 06/02/2007  . VAGINITIS, ATROPHIC, POSTMENOPAUSAL 12/06/2008  . VERTIGO 02/16/2009  . Glaucoma 1-12    Past Surgical History  Procedure Laterality Date  . Abdominal hysterectomy    . Eye surgery      for glaucoma    There were no vitals filed for this visit.  Visit Diagnosis:  Right shoulder pain  Stiffness of joint, shoulder region, right      Subjective Assessment - 01/01/15 0803    Symptoms I still have sharp pains down the right upper arm.  She reports that the right hand has been waking her up at night    Pertinent History Right RC repair 11/21/14   Limitations Writing;House hold activities;Other (comment)   Patient Stated Goals dress and do hair without pain   Currently in Pain? Yes   Pain Score 4    Pain Location Shoulder   Pain Orientation Right   Pain Descriptors / Indicators Aching   Pain Type Surgical pain   Pain Onset 1 to 4 weeks ago   Pain Frequency Constant   Aggravating Factors  movements and sleep   Pain Relieving Factors pain meds   Effect of Pain on Daily Activities limits everything                       [facility] Adult PT Treatment/Exercise - 01/01/15 0001    Posture/Postural Control   Posture Comments she is now weaning from sling, 2 hours on/off   Shoulder Exercises: Supine   Protraction AROM;Right;10 reps   External Rotation AROM;Right;15 reps   Internal Rotation AROM;Right;15 reps   Flexion AROM;15 reps  punches   Other Supine Exercises dowel rod all motions some help   Shoulder Exercises: Seated   Other Seated Exercises All two finger isometrics x 10 reps each   Shoulder Exercises: Standing   Other Standing Exercises dowel rod all motions with some assist   Shoulder Exercises: Therapy Ball   Flexion 10 reps   Other Therapy Ball Exercises cw and ccw   Shoulder Exercises: ROM/Strengthening   UBE (Upper Arm Bike) 4 minutes   "W" Arms no weight   X to V Arms no weight   Ball on Wall 15 reps   Shoulder Exercises: Stretch   Wall Stretch - Flexion 5 reps;10 seconds   Other Shoulder Stretches finger ladder 8 times flexion and abd   Modalities   Modalities Cryotherapy   Cryotherapy   Number Minutes Cryotherapy 15 Minutes   Cryotherapy Location Shoulder   Type of Cryotherapy Other (comment)   Electrical Stimulation   Electrical Stimulation Location RT shld   Electrical Stimulation Parameters IFC   Electrical Stimulation Goals Pain  PT [place] Term Goals - 12/07/14 1020    PT [place] TERM GOAL #1   Title independent with HEP   Time 4   Period Weeks   Status Achieved           PT Long Term Goals - 12/19/14 0847    PT LONG TERM GOAL #1   Title independent with HEP   Time 8   Status On-going   PT LONG TERM GOAL #2   Title independent with RICE   Time 8   Period Weeks   Status Achieved   PT LONG TERM GOAL #3   Title increase ROM of the right shoulder to 130 degrees flexion   Time 8   Period Weeks   Status On-going   PT LONG TERM GOAL #4   Title dress and do hair without difficulty   Time 8   Period Weeks   Status On-going               Plan - 01/01/15 0836    Clinical Impression Statement Still very tight and having difficulty relaxing.  Reports unable to take any pain meds so rates pain high with  activities   Pt will benefit from skilled therapeutic intervention in order to improve on the following deficits Decreased range of motion;Increased muscle spasms;Increased edema;Impaired flexibility;Pain;Postural dysfunction   Rehab Potential Good   Clinical Impairments Affecting Rehab Potential all ADLs   PT Frequency 3x / week   PT Duration 4 weeks   PT Treatment/Interventions Electrical Stimulation;Ultrasound;Moist Heat;Therapeutic exercise;Therapeutic activities;Neuromuscular re-education;Manual techniques;Patient/family education;Passive range of motion   PT Next Visit Plan Next visit will do G-code assessment, ROM measure, continue to follow protocol   PT Home Exercise Plan follow protocol   Consulted and Agree with Plan of Care Patient        Problem List Patient Active Problem List   Diagnosis Date Noted  . DYSURIA 06/13/2010  . VERTIGO 02/16/2009  . VAGINITIS, ATROPHIC, POSTMENOPAUSAL 12/06/2008  . UTI 01/24/2008  . UPPER RESPIRATORY INFECTION, VIRAL 08/23/2007  . Allergic rhinitis 06/02/2007  . Disorder of bone and cartilage 06/02/2007    Jearld Lesch, PT 01/01/2015, 8:39 AM  [facility] [place] [facility] [address] [place], [place], [address] Phone: [phone]   Fax:  [phone]

## 2015-01-04 ENCOUNTER — Ambulatory Visit: Payer: Medicare Other | Admitting: Physical Therapy

## 2015-01-04 ENCOUNTER — Encounter: Payer: Self-pay | Admitting: Physical Therapy

## 2015-01-04 DIAGNOSIS — M25511 Pain in right shoulder: Secondary | ICD-10-CM

## 2015-01-04 DIAGNOSIS — M25611 Stiffness of right shoulder, not elsewhere classified: Secondary | ICD-10-CM

## 2015-01-04 DIAGNOSIS — M7541 Impingement syndrome of right shoulder: Secondary | ICD-10-CM | POA: Diagnosis not present

## 2015-01-04 NOTE — Therapy (Signed)
[facility] [address] [place], [place], [address] Phone: [phone]   Fax:  [phone]  Physical Therapy Treatment  Patient Details  Name: Nancy Neal MRN: [id number] Date of Birth: 1932-10-16 Referring Provider:  Eugenia Mcalpine, MD  Encounter Date: 01/04/2015      PT End of Session - 01/04/15 0947    Visit Number 10   Date for PT Re-Evaluation 02/03/15   PT Start Time 0848   PT Stop Time 0944   PT Time Calculation (min) 56 min   Activity Tolerance Patient limited by pain;Patient tolerated treatment well   Behavior During Therapy [facility] for tasks assessed/performed      Past Medical History  Diagnosis Date  . ALLERGIC RHINITIS 06/02/2007  . OSTEOPENIA 06/02/2007  . VAGINITIS, ATROPHIC, POSTMENOPAUSAL 12/06/2008  . VERTIGO 02/16/2009  . Glaucoma 1-12    Past Surgical History  Procedure Laterality Date  . Abdominal hysterectomy    . Eye surgery      for glaucoma    There were no vitals filed for this visit.  Visit Diagnosis:  Right shoulder pain  Stiffness of joint, shoulder region, right      Subjective Assessment - 01/04/15 0847    Symptoms Tingling that radiates to hand.   Currently in Pain? Yes   Pain Score 5    Pain Location Shoulder   Pain Orientation Right   Multiple Pain Sites No            [facility] PT Assessment - 01/04/15 0001    ROM / Strength   AROM / PROM / Strength PROM   PROM   PROM Assessment Site Shoulder   Right/Left Shoulder Right   Right Shoulder Flexion 155 Degrees   Right Shoulder Internal Rotation 55 Degrees   Right Shoulder External Rotation 57 Degrees                   [facility] Adult PT Treatment/Exercise - 01/04/15 0001    Exercises   Exercises Shoulder   Shoulder Exercises: Standing   Other Standing Exercises dowel rod all motions with some assist   Shoulder Exercises: Pulleys   Flexion 2 minutes   Shoulder Exercises: ROM/Strengthening   UBE  (Upper Arm Bike) 4 minutes   Shoulder Exercises: Isometric Strengthening   Flexion 5X10"   Extension 5X10"   External Rotation 5X10"   Internal Rotation 5X10"   ABduction 5X10"   ADduction 5X10"   Modalities   Modalities Cryotherapy;Electrical Stimulation   Cryotherapy   Number Minutes Cryotherapy 15 Minutes   Cryotherapy Location Shoulder   Type of Cryotherapy Other (comment)  vaso   Electrical Stimulation   Electrical Stimulation Location RT shld   Electrical Stimulation Parameters IFC   Electrical Stimulation Goals Pain                  PT [place] Term Goals - 12/07/14 1020    PT [place] TERM GOAL #1   Title independent with HEP   Time 4   Period Weeks   Status Achieved           PT Long Term Goals - 12/19/14 0847    PT LONG TERM GOAL #1   Title independent with HEP   Time 8   Status On-going   PT LONG TERM GOAL #2   Title independent with RICE   Time 8   Period Weeks   Status Achieved   PT LONG TERM GOAL #3  Title increase ROM of the right shoulder to 130 degrees flexion   Time 8   Period Weeks   Status On-going   PT LONG TERM GOAL #4   Title dress and do hair without difficulty   Time 8   Period Weeks   Status On-going               Plan - 2015-01-16 0924    Clinical Impression Statement Pain increase to 8/10 with movement and most activities. Progressing well with protocol.   Pt will benefit from skilled therapeutic intervention in order to improve on the following deficits Decreased range of motion;Increased muscle spasms;Increased edema;Impaired flexibility;Pain;Postural dysfunction   Rehab Potential Good   PT Frequency 3x / week   PT Duration 4 weeks   PT Next Visit Plan continue with protocol.   Consulted and Agree with Plan of Care Patient          G-Codes - January 16, 2015 0946    Functional Limitation Self care   Self Care Current Status [phone]) At least 60 percent but less than 80 percent impaired, limited or restricted    Self Care Goal Status (B5102) At least 40 percent but less than 60 percent impaired, limited or restricted      Problem List Patient Active Problem List   Diagnosis Date Noted  . DYSURIA 06/13/2010  . VERTIGO 02/16/2009  . VAGINITIS, ATROPHIC, POSTMENOPAUSAL 12/06/2008  . UTI 01/24/2008  . UPPER RESPIRATORY INFECTION, VIRAL 08/23/2007  . Allergic rhinitis 06/02/2007  . Disorder of bone and cartilage 06/02/2007    Miryah Ralls PTA January 16, 2015, 9:48 AM  [facility] [place] [address] [place], [place], [address] Phone: [phone]   Fax:  [phone]

## 2015-01-08 ENCOUNTER — Encounter: Payer: Self-pay | Admitting: Internal Medicine

## 2015-01-08 ENCOUNTER — Ambulatory Visit (INDEPENDENT_AMBULATORY_CARE_PROVIDER_SITE_OTHER): Payer: Medicare Other | Admitting: Internal Medicine

## 2015-01-08 VITALS — BP 124/80 | HR 74 | Temp 97.7°F | Resp 20 | Ht 64.0 in | Wt 139.0 lb

## 2015-01-08 DIAGNOSIS — J3089 Other allergic rhinitis: Secondary | ICD-10-CM | POA: Diagnosis not present

## 2015-01-08 DIAGNOSIS — Z0000 Encounter for general adult medical examination without abnormal findings: Secondary | ICD-10-CM

## 2015-01-08 DIAGNOSIS — J301 Allergic rhinitis due to pollen: Secondary | ICD-10-CM

## 2015-01-08 DIAGNOSIS — M949 Disorder of cartilage, unspecified: Secondary | ICD-10-CM

## 2015-01-08 DIAGNOSIS — M899 Disorder of bone, unspecified: Secondary | ICD-10-CM | POA: Diagnosis not present

## 2015-01-08 LAB — COMPREHENSIVE METABOLIC PANEL
ALT: 14 U/L (ref 0–35)
AST: 20 U/L (ref 0–37)
Albumin: 4.5 g/dL (ref 3.5–5.2)
Alkaline Phosphatase: 71 U/L (ref 39–117)
BUN: 21 mg/dL (ref 6–23)
CALCIUM: 10.1 mg/dL (ref 8.4–10.5)
CHLORIDE: 105 meq/L (ref 96–112)
CO2: 26 meq/L (ref 19–32)
CREATININE: 0.9 mg/dL (ref 0.40–1.20)
GFR: 63.96 mL/min (ref >60.00)
GLUCOSE: 93 mg/dL (ref 70–99)
Potassium: 4.9 mEq/L (ref 3.5–5.1)
SODIUM: 141 meq/L (ref 135–145)
TOTAL PROTEIN: 7.2 g/dL (ref 6.0–8.3)
Total Bilirubin: 1.2 mg/dL (ref 0.2–1.2)

## 2015-01-08 LAB — CBC WITH DIFFERENTIAL/PLATELET
BASOS PCT: 0.2 % (ref 0.0–3.0)
Basophils Absolute: 0 10*3/uL (ref 0.0–0.1)
EOS PCT: 2.3 % (ref 0.0–5.0)
Eosinophils Absolute: 0.1 10*3/uL (ref 0.0–0.7)
HCT: 46.6 % — ABNORMAL HIGH (ref 36.0–46.0)
Hemoglobin: 15.4 g/dL — ABNORMAL HIGH (ref 12.0–15.0)
Lymphocytes Relative: 28.3 % (ref 12.0–46.0)
Lymphs Abs: 1.7 10*3/uL (ref 0.7–4.0)
MCHC: 33.1 g/dL (ref 30.0–36.0)
MCV: 94.2 fl (ref 78.0–100.0)
MONO ABS: 0.7 10*3/uL (ref 0.1–1.0)
MONOS PCT: 11 % (ref 3.0–12.0)
NEUTROS PCT: 58.2 % (ref 43.0–77.0)
Neutro Abs: 3.6 10*3/uL (ref 1.4–7.7)
Platelets: 276 10*3/uL (ref 150.0–400.0)
RBC: 4.95 Mil/uL (ref 3.87–5.11)
RDW: 14.1 % (ref 11.5–15.5)
WBC: 6.1 10*3/uL (ref 4.0–10.5)

## 2015-01-08 NOTE — Progress Notes (Signed)
Patient ID: Nancy Neal, female   DOB: 1931/12/28, 81 y.o.   MRN: [id number]  Subjective:    Patient ID: Nancy Neal, female    DOB: December 29, 1931, 81 y.o.   MRN: [id number]  HPI 81 year-old patient who is seen today for a preventive health examination.   She has been seen annually in the fall by gynecology over the years, but is not planning any further preventive examinations. She is scheduled for a mammogram later this spring. She has a history of mild allergic rhinitis atrophic vaginitis and osteopenia. She remains on calcium and vitamin D supplementations. She takes a probiotic for mild IBS but takes no prescription medications. 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 Her last screening colonoscopy was 2009. She has had an ENT evaluation. She is status post right rotator cuff surgery about 7 weeks ago   Medicare wellness visit:   1. Risk factors, based on past M,S,F history- No significant cardiovascular risk factors  2. Physical activities: remains quite active goes to a health club 4 times weekly  3. Depression/mood: no history depression or mood disorder  4. Hearing: no hearing deficits  5. ADL's: independent in all aspects of daily living  6. Fall risk: low  7. Home safety: no problems identified  8. Height weight, and visual acuity; height and weight stable no difficulty with visual acuity  9. Counseling: calcium and vitamin D supplementation as well as exercise all encouraged annual mammograms encouraged  10. Lab orders based on risk factors: laboratory profile including TSH will be reviewed  11. Referral :  Followup ophthalmology annually as well as GYN 12. Care plan: continue heart healthy diet regular exercise  13. Cognitive assessment: Alert and oriented with normal affect no cognitive dysfunction  14.  Preventive services will include an annual health assessment with screening lab.  She will continue to have annual eye examinations. Patient was provided with a  written and personalized care plan 15.  Provider list includes primary care ophthalmology, orthopedics and radiology    Current Allergies:  ! DURATUSS AC 12 (PE HCL-DIPHENHYD HCL-DM HBRTAN)   Past Medical History:   Allergic rhinitis  Osteopenia  atrophic vaginitis   Past Surgical History:    right rotator cuff surgery February 2016   Hysterectomy age 89  colonoscopy January 2009   Family History:   father died age 24, complications of cirrhosis, coronary artery disease  mother died of suicide death age 21  One brother history of osteoarthritis  two sisters are well   diabetes, one aunt, status post abdominal aneurysm repair   Social History:  widowed  nonsmoker  two children  Risk Factors:  Tobacco use: never    Past Medical History  Diagnosis Date  . ALLERGIC RHINITIS 06/02/2007  . OSTEOPENIA 06/02/2007  . VAGINITIS, ATROPHIC, POSTMENOPAUSAL 12/06/2008  . VERTIGO 02/16/2009  . Glaucoma 1-12    History   Social History  . Marital Status: Widowed    Spouse Name: N/A  . Number of Children: N/A  . Years of Education: N/A   Occupational History  . Not on file.   Social History Main Topics  . Smoking status: Never Smoker   . Smokeless tobacco: Never Used  . Alcohol Use: Yes  . Drug Use: No  . Sexual Activity: Not on file   Other Topics Concern  . Not on file   Social History Narrative    Past Surgical History  Procedure Laterality Date  . Abdominal hysterectomy    .  Eye surgery      for glaucoma    Family History  Problem Relation Age of Onset  . Suicidality Mother   . Heart disease Father   . Arthritis Brother     Allergies  Allergen Reactions  . Codeine Sulfate   . Meclizine Other (See Comments)    glaucoma    Current Outpatient Prescriptions on File Prior to Visit  Medication Sig Dispense Refill  . calcium carbonate (CALCIUM 500) 1250 MG tablet Take 1 tablet by mouth daily.      . carboxymethylcellulose (REFRESH PLUS) 0.5 % SOLN  1 drop 3 (three) times daily as needed.    . fexofenadine (ALLEGRA) 60 MG tablet Take 60 mg by mouth daily.      . GuaiFENesin (MUCUS RELIEF ADULT PO) Take 1 tablet by mouth as needed.    Marland Kitchen ibuprofen (ADVIL,MOTRIN) 600 MG tablet     . multivitamin (THERAGRAN) per tablet Take 1 tablet by mouth daily.      Marland Kitchen omeprazole (PRILOSEC OTC) 20 MG tablet Take 20 mg by mouth daily.    . Probiotic Product (PROBIOTIC FORMULA PO) Take by mouth.    . Sodium Chloride-Sodium Bicarb (NASAMIST ISOTONIC NA) Place into the nose.     No current facility-administered medications on file prior to visit.    BP 124/80 mmHg  Pulse 74  Temp(Src) 97.7 F (36.5 C) (Oral)  Resp 20  Ht 5\' 4"  (1.626 m)  Wt 139 lb (63.05 kg)  BMI 23.85 kg/m2  SpO2 98%    Review of Systems  Constitutional: Negative for fever, appetite change, fatigue and unexpected weight change.  HENT: Positive for hearing loss (left ear). Negative for congestion, dental problem, ear pain, mouth sores, nosebleeds, sinus pressure, sore throat, tinnitus, trouble swallowing and voice change.   Eyes: Negative for photophobia, pain, redness and visual disturbance.  Respiratory: Negative for cough, chest tightness and shortness of breath.   Cardiovascular: Negative for chest pain, palpitations and leg swelling.  Gastrointestinal: Negative for nausea, vomiting, abdominal pain, diarrhea, constipation, blood in stool, abdominal distention and rectal pain.  Genitourinary: Negative for dysuria, urgency, frequency, hematuria, flank pain, vaginal bleeding, vaginal discharge, difficulty urinating, genital sores, vaginal pain, menstrual problem and pelvic pain.  Musculoskeletal: Negative for back pain, arthralgias and neck stiffness.  Skin: Negative for rash.  Neurological: Negative for dizziness, syncope, speech difficulty, weakness, light-headedness, numbness and headaches.  Hematological: Negative for adenopathy. Does not bruise/bleed easily.   Psychiatric/Behavioral: Negative for suicidal ideas, behavioral problems, self-injury, dysphoric mood and agitation. The patient is not nervous/anxious.        Objective:   Physical Exam  Constitutional: She is oriented to person, place, and time. She appears well-developed and well-nourished.  HENT:  Head: Normocephalic and atraumatic.  Right Ear: External ear normal.  Left Ear: External ear normal.  Mouth/Throat: Oropharynx is clear and moist.  Cerumen left canal  Eyes: Conjunctivae and EOM are normal.  Neck: Normal range of motion. Neck supple. No JVD present. No thyromegaly present.  Cardiovascular: Normal rate, regular rhythm, normal heart sounds and intact distal pulses.   No murmur heard. Pulmonary/Chest: Effort normal and breath sounds normal. She has no wheezes. She has no rales.  Abdominal: Soft. Bowel sounds are normal. She exhibits no distension and no mass. There is no tenderness. There is no rebound and no guarding.  Musculoskeletal: Normal range of motion. She exhibits no edema or tenderness.  Neurological: She is alert and oriented to person, place, and time.  She has normal reflexes. No cranial nerve deficit. She exhibits normal muscle tone. Coordination normal.  Skin: Skin is warm and dry. No rash noted.  Psychiatric: She has a normal mood and affect. Her behavior is normal.          Assessment & Plan:  Preventive health examination  Osteopenia. Continue calcium and vitamin D supplements and active lifestyle Allergic rhinitis stable Mild glaucoma. Followup ophthalmology  Follow-up orthopedic surgery   Return here one year

## 2015-01-08 NOTE — Patient Instructions (Signed)
Take a calcium supplement, plus 800-1200 units of vitamin D    It is important that you exercise regularly, at least 20 minutes 3 to 4 times per week.  If you develop chest pain or shortness of breath seek  medical attention.  Return in one year for follow-up Health Maintenance Adopting a healthy lifestyle and getting preventive care can go a long way to promote health and wellness. Talk with your health care provider about what schedule of regular examinations is right for you. This is a good chance for you to check in with your provider about disease prevention and staying healthy. In between checkups, there are plenty of things you can do on your own. Experts have done a lot of research about which lifestyle changes and preventive measures are most likely to keep you healthy. Ask your health care provider for more information. WEIGHT AND DIET  Eat a healthy diet  Be sure to include plenty of vegetables, fruits, low-fat dairy products, and lean protein.  Do not eat a lot of foods high in solid fats, added sugars, or salt.  Get regular exercise. This is one of the most important things you can do for your health.  Most adults should exercise for at least 150 minutes each week. The exercise should increase your heart rate and make you sweat (moderate-intensity exercise).  Most adults should also do strengthening exercises at least twice a week. This is in addition to the moderate-intensity exercise.  Maintain a healthy weight  Body mass index (BMI) is a measurement that can be used to identify possible weight problems. It estimates body fat based on height and weight. Your health care provider can help determine your BMI and help you achieve or maintain a healthy weight.  For females 20 years of age and older:   A BMI below 18.5 is considered underweight.  A BMI of 18.5 to 24.9 is normal.  A BMI of 25 to 29.9 is considered overweight.  A BMI of 30 and above is considered obese.   Watch levels of cholesterol and blood lipids  You should start having your blood tested for lipids and cholesterol at 79 years of age, then have this test every 5 years.  You may need to have your cholesterol levels checked more often if:  Your lipid or cholesterol levels are high.  You are older than 79 years of age.  You are at high risk for heart disease.  CANCER SCREENING   Lung Cancer  Lung cancer screening is recommended for adults 55-80 years old who are at high risk for lung cancer because of a history of smoking.  A yearly low-dose CT scan of the lungs is recommended for people who:  Currently smoke.  Have quit within the past 15 years.  Have at least a 30-pack-year history of smoking. A pack year is smoking an average of one pack of cigarettes a day for 1 year.  Yearly screening should continue until it has been 15 years since you quit.  Yearly screening should stop if you develop a health problem that would prevent you from having lung cancer treatment.  Breast Cancer  Practice breast self-awareness. This means understanding how your breasts normally appear and feel.  It also means doing regular breast self-exams. Let your health care provider know about any changes, no matter how small.  If you are in your 20s or 30s, you should have a clinical breast exam (CBE) by a health care provider every 1-3 years   years as part of a regular health exam.  If you are 32 or older, have a CBE every year. Also consider having a breast X-ray (mammogram) every year.  If you have a family history of breast cancer, talk to your health care provider about genetic screening.  If you are at high risk for breast cancer, talk to your health care provider about having an MRI and a mammogram every year.  Breast cancer gene (BRCA) assessment is recommended for women who have family members with BRCA-related cancers. BRCA-related cancers  include:  Breast.  Ovarian.  Tubal.  Peritoneal cancers.  Results of the assessment will determine the need for genetic counseling and BRCA1 and BRCA2 testing. Cervical Cancer Routine pelvic examinations to screen for cervical cancer are no longer recommended for nonpregnant women who are considered low risk for cancer of the pelvic organs (ovaries, uterus, and vagina) and who do not have symptoms. A pelvic examination may be necessary if you have symptoms including those associated with pelvic infections. Ask your health care provider if a screening pelvic exam is right for you.   The Pap test is the screening test for cervical cancer for women who are considered at risk.  If you had a hysterectomy for a problem that was not cancer or a condition that could lead to cancer, then you no longer need Pap tests.  If you are older than 65 years, and you have had normal Pap tests for the past 10 years, you no longer need to have Pap tests.  If you have had past treatment for cervical cancer or a condition that could lead to cancer, you need Pap tests and screening for cancer for at least 20 years after your treatment.  If you no longer get a Pap test, assess your risk factors if they change (such as having a new sexual partner). This can affect whether you should start being screened again.  Some women have medical problems that increase their chance of getting cervical cancer. If this is the case for you, your health care provider may recommend more frequent screening and Pap tests.  The human papillomavirus (HPV) test is another test that may be used for cervical cancer screening. The HPV test looks for the virus that can cause cell changes in the cervix. The cells collected during the Pap test can be tested for HPV.  The HPV test can be used to screen women 34 years of age and older. Getting tested for HPV can extend the interval between normal Pap tests from three to five years.  An HPV  test also should be used to screen women of any age who have unclear Pap test results.  After 79 years of age, women should have HPV testing as often as Pap tests.  Colorectal Cancer  This type of cancer can be detected and often prevented.  Routine colorectal cancer screening usually begins at 79 years of age and continues through 79 years of age.  Your health care provider may recommend screening at an earlier age if you have risk factors for colon cancer.  Your health care provider may also recommend using home test kits to check for hidden blood in the stool.  A small camera at the end of a tube can be used to examine your colon directly (sigmoidoscopy or colonoscopy). This is done to check for the earliest forms of colorectal cancer.  Routine screening usually begins at age 58.  Direct examination of the colon should be repeated  every 5-10 years through 79 years of age. However, you may need to be screened more often if early forms of precancerous polyps or small growths are found. Skin Cancer  Check your skin from head to toe regularly.  Tell your health care provider about any new moles or changes in moles, especially if there is a change in a mole's shape or color.  Also tell your health care provider if you have a mole that is larger than the size of a pencil eraser.  Always use sunscreen. Apply sunscreen liberally and repeatedly throughout the day.  Protect yourself by wearing long sleeves, pants, a wide-brimmed hat, and sunglasses whenever you are outside. HEART DISEASE, DIABETES, AND HIGH BLOOD PRESSURE   Have your blood pressure checked at least every 1-2 years. High blood pressure causes heart disease and increases the risk of stroke.  If you are between 70 years and 63 years old, ask your health care provider if you should take aspirin to prevent strokes.  Have regular diabetes screenings. This involves taking a blood sample to check your fasting blood sugar  level.  If you are at a normal weight and have a low risk for diabetes, have this test once every three years after 79 years of age.  If you are overweight and have a high risk for diabetes, consider being tested at a younger age or more often. PREVENTING INFECTION  Hepatitis B  If you have a higher risk for hepatitis B, you should be screened for this virus. You are considered at high risk for hepatitis B if:  You were born in a country where hepatitis B is common. Ask your health care provider which countries are considered high risk.  Your parents were born in a high-risk country, and you have not been immunized against hepatitis B (hepatitis B vaccine).  You have HIV or AIDS.  You use needles to inject street drugs.  You live with someone who has hepatitis B.  You have had sex with someone who has hepatitis B.  You get hemodialysis treatment.  You take certain medicines for conditions, including cancer, organ transplantation, and autoimmune conditions. Hepatitis C  Blood testing is recommended for:  Everyone born from [birth year] through 1963.  Anyone with known risk factors for hepatitis C. Sexually transmitted infections (STIs)  You should be screened for sexually transmitted infections (STIs) including gonorrhea and chlamydia if:  You are sexually active and are younger than 79 years of age.  You are older than 79 years of age and your health care provider tells you that you are at risk for this type of infection.  Your sexual activity has changed since you were last screened and you are at an increased risk for chlamydia or gonorrhea. Ask your health care provider if you are at risk.  If you do not have HIV, but are at risk, it may be recommended that you take a prescription medicine daily to prevent HIV infection. This is called pre-exposure prophylaxis (PrEP). You are considered at risk if:  You are sexually active and do not regularly use condoms or know the HIV status  of your partner(s).  You take drugs by injection.  You are sexually active with a partner who has HIV. Talk with your health care provider about whether you are at high risk of being infected with HIV. If you choose to begin PrEP, you should first be tested for HIV. You should then be tested every 3 months for as long as  you are taking PrEP.  PREGNANCY   If you are premenopausal and you may become pregnant, ask your health care provider about preconception counseling.  If you may become pregnant, take 400 to 800 micrograms (mcg) of folic acid every day.  If you want to prevent pregnancy, talk to your health care provider about birth control (contraception). OSTEOPOROSIS AND MENOPAUSE   Osteoporosis is a disease in which the bones lose minerals and strength with aging. This can result in serious bone fractures. Your risk for osteoporosis can be identified using a bone density scan.  If you are 65 years of age or older, or if you are at risk for osteoporosis and fractures, ask your health care provider if you should be screened.  Ask your health care provider whether you should take a calcium or vitamin D supplement to lower your risk for osteoporosis.  Menopause may have certain physical symptoms and risks.  Hormone replacement therapy may reduce some of these symptoms and risks. Talk to your health care provider about whether hormone replacement therapy is right for you.  HOME CARE INSTRUCTIONS   Schedule regular health, dental, and eye exams.  Stay current with your immunizations.   Do not use any tobacco products including cigarettes, chewing tobacco, or electronic cigarettes.  If you are pregnant, do not drink alcohol.  If you are breastfeeding, limit how much and how often you drink alcohol.  Limit alcohol intake to no more than 1 drink per day for nonpregnant women. One drink equals 12 ounces of beer, 5 ounces of wine, or 1 ounces of hard liquor.  Do not use street  drugs.  Do not share needles.  Ask your health care provider for help if you need support or information about quitting drugs.  Tell your health care provider if you often feel depressed.  Tell your health care provider if you have ever been abused or do not feel safe at home. Document Released: 04/21/2011 Document Revised: 02/20/2014 Document Reviewed: 09/07/2013 ExitCare Patient Information 2015 [employer]. This information is not intended to replace advice given to you by your health care provider. Make sure you discuss any questions you have with your health care provider.  

## 2015-01-09 ENCOUNTER — Ambulatory Visit: Payer: Medicare Other | Admitting: Physical Therapy

## 2015-01-09 DIAGNOSIS — M25511 Pain in right shoulder: Secondary | ICD-10-CM

## 2015-01-09 DIAGNOSIS — M25611 Stiffness of right shoulder, not elsewhere classified: Secondary | ICD-10-CM

## 2015-01-09 DIAGNOSIS — M7541 Impingement syndrome of right shoulder: Secondary | ICD-10-CM | POA: Diagnosis not present

## 2015-01-09 NOTE — Therapy (Signed)
[facility] [address] [place], [place], [address] Phone: [phone]   Fax:  [phone]  Physical Therapy Treatment  Patient Details  Name: Nancy Neal MRN: [id number] Date of Birth: 04/26/1932 Referring Provider:  Collins, Robert, MD  Encounter Date: 01/09/2015      PT End of Session - 01/09/15 1144    Visit Number 11   Date for PT Re-Evaluation 02/03/15   PT Start Time 1100   PT Stop Time 1159   PT Time Calculation (min) 59 min   Activity Tolerance Patient limited by pain;Patient tolerated treatment well   Behavior During Therapy WFL for tasks assessed/performed      Past Medical History  Diagnosis Date  . ALLERGIC RHINITIS 06/02/2007  . OSTEOPENIA 06/02/2007  . VAGINITIS, ATROPHIC, POSTMENOPAUSAL 12/06/2008  . VERTIGO 02/16/2009  . Glaucoma 1-12    Past Surgical History  Procedure Laterality Date  . Abdominal hysterectomy    . Eye surgery      for glaucoma    There were no vitals filed for this visit.  Visit Diagnosis:  Right shoulder pain  Stiffness of joint, shoulder region, right      Subjective Assessment - 01/09/15 1102    Symptoms Feels okay today   Pertinent History Right RC repair 11/21/14   Limitations Writing;House hold activities;Other (comment)   Patient Stated Goals dress and do hair without pain   Currently in Pain? Yes   Pain Score 6    Pain Location Shoulder   Pain Orientation Right   Pain Descriptors / Indicators Aching   Pain Type Surgical pain   Pain Onset More than a month ago   Pain Frequency Constant                       [facility] Adult PT Treatment/Exercise - 01/09/15 1105    Shoulder Exercises: Sidelying   External Rotation AAROM;Right;20 reps   Flexion AAROM;Right;20 reps   Flexion Limitations AA initially progressing to active   Shoulder Exercises: Standing   Flexion AAROM;Right;10 reps   Flexion Limitations wall ladder   ABduction  AAROM;Right;10 reps   ABduction Limitations wall ladder, scaption   Extension Strengthening;Both;20 reps;Theraband   Theraband Level (Shoulder Extension) Level 1 (Yellow)   Extension Limitations with scap ret   Retraction Strengthening;Both;20 reps;Theraband   Theraband Level (Shoulder Retraction) Level 1 (Yellow)   Other Standing Exercises dowel rod all motions with some assist   Shoulder Exercises: ROM/Strengthening   UBE (Upper Arm Bike) Level 2 x 8 min; alt 2 min forward/2 min backward   Modalities   Modalities Cryotherapy;Electrical Stimulation   Cryotherapy   Number Minutes Cryotherapy 15 Minutes   Cryotherapy Location Shoulder   Type of Cryotherapy Ice pack   Electrical Stimulation   Electrical Stimulation Location RT shld   Electrical Stimulation Parameters IFC   Electrical Stimulation Goals Pain                  PT [place] Term Goals - 12/07/14 1020    PT [place] TERM GOAL #1   Title independent with HEP   Time 4   Period Weeks   Status Achieved           PT Long Term Goals - 12/19/14 0847    PT LONG TERM GOAL #1   Title independent with HEP   Time 8   Status On-going   PT LONG TERM GOAL #2   Title   independent with RICE   Time 8   Period Weeks   Status Achieved   PT LONG TERM GOAL #3   Title increase ROM of the right shoulder to 130 degrees flexion   Time 8   Period Weeks   Status On-going   PT LONG TERM GOAL #4   Title dress and do hair without difficulty   Time 8   Period Weeks   Status On-going               Plan - 01/09/15 1145    Clinical Impression Statement Able to progress strengthening and active ROM exercises today.  Will continue to benefit from PT to maximize function.   PT Next Visit Plan continue with protocol.  Update HEP   PT Home Exercise Plan follow protocol   Consulted and Agree with Plan of Care Patient        Problem List Patient Active Problem List   Diagnosis Date Noted  . DYSURIA 06/13/2010  .  VERTIGO 02/16/2009  . VAGINITIS, ATROPHIC, POSTMENOPAUSAL 12/06/2008  . UTI 01/24/2008  . UPPER RESPIRATORY INFECTION, VIRAL 08/23/2007  . Allergic rhinitis 06/02/2007  . Disorder of bone and cartilage 06/02/2007   Stephanie F Matthews, PT, DPT 01/09/2015 12:00 PM  [facility] [address] [place], [place], [address] Phone: [phone]   Fax:  [phone]      

## 2015-01-11 ENCOUNTER — Ambulatory Visit: Payer: Medicare Other | Admitting: Physical Therapy

## 2015-01-11 DIAGNOSIS — M25611 Stiffness of right shoulder, not elsewhere classified: Secondary | ICD-10-CM

## 2015-01-11 DIAGNOSIS — M7541 Impingement syndrome of right shoulder: Secondary | ICD-10-CM | POA: Diagnosis not present

## 2015-01-11 DIAGNOSIS — M25511 Pain in right shoulder: Secondary | ICD-10-CM

## 2015-01-11 NOTE — Therapy (Signed)
[facility] [address] [place], [place], [address] Phone: [phone]   Fax:  [phone]  Physical Therapy Treatment  Patient Details  Name: Nancy Neal MRN: [id number] Date of Birth: [birth year] Referring Provider:  Eugenia Mcalpine, MD  Encounter Date: 01/11/2015      PT End of Session - 01/11/15 1158    Visit Number 12   Date for PT Re-Evaluation 02/03/15   PT Start Time 1103   PT Stop Time 1158   PT Time Calculation (min) 55 min   Activity Tolerance Patient limited by pain;Patient tolerated treatment well   Behavior During Therapy [facility] for tasks assessed/performed      Past Medical History  Diagnosis Date  . ALLERGIC RHINITIS 06/02/2007  . OSTEOPENIA 06/02/2007  . VAGINITIS, ATROPHIC, POSTMENOPAUSAL 12/06/2008  . VERTIGO 02/16/2009  . Glaucoma 1-12    Past Surgical History  Procedure Laterality Date  . Abdominal hysterectomy    . Eye surgery      for glaucoma    There were no vitals filed for this visit.  Visit Diagnosis:  Right shoulder pain  Stiffness of joint, shoulder region, right      Subjective Assessment - 01/11/15 1110    Symptoms shoulder is really sore today and last night; not sure what aggravated it.  Reports putting arm up on steering wheel yesterday   Pertinent History Right RC repair 11/21/14   Limitations Writing;House hold activities;Other (comment)   Patient Stated Goals dress and do hair without pain   Currently in Pain? Yes   Pain Score 6    Pain Location Shoulder   Pain Orientation Right   Pain Descriptors / Indicators Aching   Pain Type Surgical pain   Pain Onset More than a month ago                       [facility] Adult PT Treatment/Exercise - 01/11/15 1111    Shoulder Exercises: Supine   Protraction AAROM;Right;15 reps   Protraction Limitations with cane   External Rotation AAROM;Right;15 reps   External Rotation Limitations with cane   Flexion  AAROM;Right;15 reps   Flexion Limitations with cane   Shoulder Exercises: Sidelying   External Rotation AROM;10 reps;Weights;Right   External Rotation Weight (lbs) 1# x 10   External Rotation Limitations 0# x 10   Flexion AROM;Right;15 reps   Shoulder Exercises: Standing   Flexion AAROM;Right;10 reps   Flexion Limitations wall ladder   ABduction AAROM;Right;10 reps   ABduction Limitations wall ladder, scaption   Shoulder Exercises: ROM/Strengthening   UBE (Upper Arm Bike) Level 2 x 6:30 min; alt forwards/backwards   Modalities   Modalities Cryotherapy;Electrical Stimulation   Cryotherapy   Number Minutes Cryotherapy 15 Minutes   Cryotherapy Location Shoulder   Type of Cryotherapy Ice pack   Electrical Stimulation   Electrical Stimulation Location RT shld   Electrical Stimulation Parameters IFC   Electrical Stimulation Goals Pain                PT Education - 01/11/15 1158    Education provided Yes   Education Details HEP   Person(s) Educated Patient   Methods Explanation;Demonstration;Handout   Comprehension Verbalized understanding;Need further instruction;Returned demonstration          PT [place] Term Goals - 12/07/14 1020    PT [place] TERM GOAL #1   Title independent with HEP   Time 4   Period Weeks  Status Achieved           PT Long Term Goals - 12/19/14 0847    PT LONG TERM GOAL #1   Title independent with HEP   Time 8   Status On-going   PT LONG TERM GOAL #2   Title independent with RICE   Time 8   Period Weeks   Status Achieved   PT LONG TERM GOAL #3   Title increase ROM of the right shoulder to 130 degrees flexion   Time 8   Period Weeks   Status On-going   PT LONG TERM GOAL #4   Title dress and do hair without difficulty   Time 8   Period Weeks   Status On-going               Plan - 01/11/15 1158    PT Next Visit Plan continue with protocol.  Review HEP   PT Home Exercise Plan follow protocol   Consulted and Agree with  Plan of Care Patient        Problem List Patient Active Problem List   Diagnosis Date Noted  . DYSURIA 06/13/2010  . VERTIGO 02/16/2009  . VAGINITIS, ATROPHIC, POSTMENOPAUSAL 12/06/2008  . UTI 01/24/2008  . UPPER RESPIRATORY INFECTION, VIRAL 08/23/2007  . Allergic rhinitis 06/02/2007  . Disorder of bone and cartilage 06/02/2007   Clarita Crane, PT, DPT 01/11/2015 11:59 AM  [facility] [address] [place], [place], [address] Phone: [phone]   Fax:  [phone]

## 2015-01-11 NOTE — Patient Instructions (Signed)
SHOULDER: External Rotation - Supine (Cane)   Hold cane with both hands. Rotate arm away from body. Keep elbow on floor and next to body. _15__ reps per set, _2-3__ times per day.  Add towel to keep elbow at side.  Copyright  VHI. All rights reserved.  Cane Horizontal - Supine   With straight arms holding cane above shoulders, bring cane out to right, center, out to left, and back to above head. Repeat ___ times. Do ___ times per day.  Copyright  VHI. All rights reserved.  Cane Exercise: Flexion   Lie on back, holding cane above chest. Keeping arms as straight as possible, lower cane toward floor beyond head. Hold __1-2__ seconds. Repeat _15___ times. Do __2-3__ sessions per day.  [email or web address].exer.[email or web address]   Copyright  VHI. All rights reserved.   Supine: Chest Press (Active)   Lie on back with arms fully extended. Lower bar or dowel slowly to chest and press to arm's length.  Complete _1__ sets of _15__ repetitions. Perform _2-3__ sessions per day.  Copyright  VHI. All rights reserved.   Clarita Crane, PT, DPT 01/11/2015 11:43 AM  [facility] [address] [place], [place] [address]  [phone] (office) [phone] (fax)

## 2015-01-16 ENCOUNTER — Ambulatory Visit: Payer: Medicare Other | Admitting: Physical Therapy

## 2015-01-16 ENCOUNTER — Encounter: Payer: Self-pay | Admitting: Physical Therapy

## 2015-01-16 DIAGNOSIS — M7541 Impingement syndrome of right shoulder: Secondary | ICD-10-CM | POA: Diagnosis not present

## 2015-01-16 DIAGNOSIS — M25511 Pain in right shoulder: Secondary | ICD-10-CM

## 2015-01-16 DIAGNOSIS — M25611 Stiffness of right shoulder, not elsewhere classified: Secondary | ICD-10-CM

## 2015-01-16 NOTE — Therapy (Signed)
[facility] [place] [address] [place], [place], [address] Phone: [phone]   Fax:  [phone]  Physical Therapy Treatment  Patient Details  Name: Nancy Neal MRN: [id number] Date of Birth: [birth year] Referring Provider:  Eugenia Mcalpine, MD  Encounter Date: 01/16/2015      PT End of Session - 01/16/15 1013    Visit Number 13   Date for PT Re-Evaluation 02/03/15   PT Start Time 0929   PT Stop Time 1030   PT Time Calculation (min) 61 min      Past Medical History  Diagnosis Date  . ALLERGIC RHINITIS 06/02/2007  . OSTEOPENIA 06/02/2007  . VAGINITIS, ATROPHIC, POSTMENOPAUSAL 12/06/2008  . VERTIGO 02/16/2009  . Glaucoma 1-12    Past Surgical History  Procedure Laterality Date  . Abdominal hysterectomy    . Eye surgery      for glaucoma    There were no vitals filed for this visit.  Visit Diagnosis:  Right shoulder pain  Stiffness of joint, shoulder region, right      Subjective Assessment - 01/16/15 0930    Symptoms Reports increased pain after last treatment.  Reports some increased warmth in the right arm, and pain waking her up at night   Pertinent History Right RC repair 11/21/14   Limitations Writing;House hold activities;Other (comment)   Patient Stated Goals dress and do hair without pain   Currently in Pain? Yes   Pain Score 5   reports when pain wakes her at night the pain is a 9/10, unsure of how she sleeps   Pain Location Shoulder   Pain Orientation Right   Pain Descriptors / Indicators Aching;Tingling   Pain Type Surgical pain   Pain Radiating Towards numbness and tingling into the hand   Pain Onset More than a month ago   Aggravating Factors  worse with sleeping   Pain Relieving Factors pain meds   Effect of Pain on Daily Activities dressing and ADL's                       [facility] Adult PT Treatment/Exercise - 01/16/15 0001    Shoulder Exercises: Standing   Flexion  AAROM;Right;10 reps   Flexion Limitations wall ladder   Retraction Strengthening   Theraband Level (Shoulder Retraction) Level 1 (Yellow)   Other Standing Exercises dowel rod all motions with some assist   Shoulder Exercises: Pulleys   Other Pulley Exercises yellow tband triceps and biceps   Shoulder Exercises: Therapy Ball   Other Therapy Ball Exercises cw and ccw   Other Therapy Ball Exercises ball rolling up wall, wall slides with pillow case    Shoulder Exercises: ROM/Strengthening   UBE (Upper Arm Bike) Level 2 x 6:30 min; alt forwards/backwards   "W" Arms no weight   X to V Arms no weight   Other ROM/Strengthening Exercises seated row 15#   Other ROM/Strengthening Exercises lat pulls 15#   Shoulder Exercises: Stretch   Wall Stretch - Flexion 5 reps;10 seconds   Other Shoulder Stretches finger ladder 8 times flexion and abd   Cryotherapy   Number Minutes Cryotherapy 15 Minutes   Cryotherapy Location Shoulder   Type of Cryotherapy Ice pack   Electrical Stimulation   Electrical Stimulation Location RT shld   Electrical Stimulation Parameters IFC   Electrical Stimulation Goals Pain                PT Education - 01/16/15  1012    Education provided Yes   Education Details HEP for isometrics and AROM   Person(s) Educated Patient   Methods Explanation;Demonstration;Tactile cues;Verbal cues;Handout   Comprehension Verbalized understanding;Returned demonstration;Verbal cues required          PT Caver Term Goals - 12/07/14 1020    PT Aument TERM GOAL #1   Title independent with HEP   Time 4   Period Weeks   Status Achieved           PT Long Term Goals - 12/19/14 0847    PT LONG TERM GOAL #1   Title independent with HEP   Time 8   Status On-going   PT LONG TERM GOAL #2   Title independent with RICE   Time 8   Period Weeks   Status Achieved   PT LONG TERM GOAL #3   Title increase ROM of the right shoulder to 130 degrees flexion   Time 8   Period Weeks    Status On-going   PT LONG TERM GOAL #4   Title dress and do hair without difficulty   Time 8   Period Weeks   Status On-going               Plan - 01/16/15 1014    Clinical Impression Statement Patient frustrated that she cannot reach the top of her head, has symptoms of hand pain   Pt will benefit from skilled therapeutic intervention in order to improve on the following deficits Decreased range of motion;Increased muscle spasms;Increased edema;Impaired flexibility;Pain;Postural dysfunction   Rehab Potential Good   Clinical Impairments Affecting Rehab Potential all ADLs   PT Frequency 3x / week   PT Duration 3 weeks   PT Treatment/Interventions Electrical Stimulation;Ultrasound;Moist Heat;Therapeutic exercise;Therapeutic activities;Neuromuscular re-education;Manual techniques;Patient/family education;Passive range of motion   PT Next Visit Plan continue with protocol.  Review HEP   Consulted and Agree with Plan of Care Patient        Problem List Patient Active Problem List   Diagnosis Date Noted  . DYSURIA 06/13/2010  . VERTIGO 02/16/2009  . VAGINITIS, ATROPHIC, POSTMENOPAUSAL 12/06/2008  . UTI 01/24/2008  . UPPER RESPIRATORY INFECTION, VIRAL 08/23/2007  . Allergic rhinitis 06/02/2007  . Disorder of bone and cartilage 06/02/2007    Jearld Lesch, PT 01/16/2015, 10:20 AM  [facility] [place] [address] [place], [place], [address] Phone: [phone]   Fax:  [phone]

## 2015-01-18 ENCOUNTER — Encounter: Payer: Self-pay | Admitting: Physical Therapy

## 2015-01-18 ENCOUNTER — Ambulatory Visit: Payer: Medicare Other | Admitting: Physical Therapy

## 2015-01-18 ENCOUNTER — Ambulatory Visit: Payer: Medicare Other

## 2015-01-18 DIAGNOSIS — M25511 Pain in right shoulder: Secondary | ICD-10-CM

## 2015-01-18 DIAGNOSIS — M25611 Stiffness of right shoulder, not elsewhere classified: Secondary | ICD-10-CM

## 2015-01-18 DIAGNOSIS — M7541 Impingement syndrome of right shoulder: Secondary | ICD-10-CM | POA: Diagnosis not present

## 2015-01-18 NOTE — Therapy (Signed)
[facility] [place] [address] [place], [place], [address] Phone: [phone]   Fax:  [phone]  Physical Therapy Treatment  Patient Details  Name: Nancy Neal MRN: [id number] Date of Birth: [birth year] Referring Provider:  Eugenia Mcalpine, MD  Encounter Date: 01/18/2015      PT End of Session - 01/18/15 1154    Visit Number 14   Date for PT Re-Evaluation 02/03/15   PT Start Time 1100   PT Stop Time 1154   PT Time Calculation (min) 54 min   Activity Tolerance Patient tolerated treatment well   Behavior During Therapy [facility] for tasks assessed/performed      Past Medical History  Diagnosis Date  . ALLERGIC RHINITIS 06/02/2007  . OSTEOPENIA 06/02/2007  . VAGINITIS, ATROPHIC, POSTMENOPAUSAL 12/06/2008  . VERTIGO 02/16/2009  . Glaucoma 1-12    Past Surgical History  Procedure Laterality Date  . Abdominal hysterectomy    . Eye surgery      for glaucoma    There were no vitals filed for this visit.  Visit Diagnosis:  No diagnosis found.      Subjective Assessment - 01/18/15 1058    Symptoms Reports increased pain after last treatment.   Currently in Pain? Yes   Pain Score 7             [facility] PT Assessment - 01/18/15 0001    ROM / Strength   AROM / PROM / Strength AROM   AROM   AROM Assessment Site Shoulder   Right/Left Shoulder Right   Right Shoulder Flexion 98 Degrees   Right Shoulder ABduction 84 Degrees                   [facility] Adult PT Treatment/Exercise - 01/18/15 0001    Exercises   Exercises Shoulder   Shoulder Exercises: Supine   External Rotation 10 reps  2 sets   Internal Rotation 10 reps  2 sets   Flexion 10 reps  2 sets   Shoulder Exercises: Standing   Flexion Limitations wall ladder  10 reps   Other Standing Exercises pillowcase wall slide with retract/lower   Other Standing Exercises 3# bicep curls 2x10   Shoulder Exercises: Pulleys   Other Pulley Exercises ball  roll on mat fwd,bk,cw,ccw  2x10   Shoulder Exercises: ROM/Strengthening   UBE (Upper Arm Bike) 4 minutes  58fwd/2bk                  PT Sprigg Term Goals - 12/07/14 1020    PT Lysaght TERM GOAL #1   Title independent with HEP   Time 4   Period Weeks   Status Achieved           PT Long Term Goals - 12/19/14 0847    PT LONG TERM GOAL #1   Title independent with HEP   Time 8   Status On-going   PT LONG TERM GOAL #2   Title independent with RICE   Time 8   Period Weeks   Status Achieved   PT LONG TERM GOAL #3   Title increase ROM of the right shoulder to 130 degrees flexion   Time 8   Period Weeks   Status On-going   PT LONG TERM GOAL #4   Title dress and do hair without difficulty   Time 8   Period Weeks   Status On-going  Problem List Patient Active Problem List   Diagnosis Date Noted  . DYSURIA 06/13/2010  . VERTIGO 02/16/2009  . VAGINITIS, ATROPHIC, POSTMENOPAUSAL 12/06/2008  . UTI 01/24/2008  . UPPER RESPIRATORY INFECTION, VIRAL 08/23/2007  . Allergic rhinitis 06/02/2007  . Disorder of bone and cartilage 06/02/2007    Nancy Neal PTA 01/18/2015, 11:56 AM  [facility] [place] [address] [place], [place], [address] Phone: [phone]   Fax:  [phone]

## 2015-01-23 ENCOUNTER — Encounter: Payer: Self-pay | Admitting: Physical Therapy

## 2015-01-23 ENCOUNTER — Ambulatory Visit: Payer: Medicare Other | Attending: Specialist | Admitting: Physical Therapy

## 2015-01-23 DIAGNOSIS — M25611 Stiffness of right shoulder, not elsewhere classified: Secondary | ICD-10-CM

## 2015-01-23 DIAGNOSIS — M7541 Impingement syndrome of right shoulder: Secondary | ICD-10-CM | POA: Insufficient documentation

## 2015-01-23 DIAGNOSIS — M25511 Pain in right shoulder: Secondary | ICD-10-CM

## 2015-01-23 NOTE — Therapy (Signed)
[facility] [place] [address] [place], [place], [address] Phone: [phone]   Fax:  [phone]  Physical Therapy Treatment  Patient Details  Name: Nancy Neal MRN: [id number] Date of Birth: 26-Jun-1932 Referring Provider:  Gordy Savers, MD  Encounter Date: 01/23/2015      PT End of Session - 01/23/15 0926    Visit Number 15   Date for PT Re-Evaluation 02/03/15   PT Start Time 0843   PT Stop Time 0944   PT Time Calculation (min) 61 min      Past Medical History  Diagnosis Date  . ALLERGIC RHINITIS 06/02/2007  . OSTEOPENIA 06/02/2007  . VAGINITIS, ATROPHIC, POSTMENOPAUSAL 12/06/2008  . VERTIGO 02/16/2009  . Glaucoma 1-12    Past Surgical History  Procedure Laterality Date  . Abdominal hysterectomy    . Eye surgery      for glaucoma    There were no vitals filed for this visit.  Visit Diagnosis:  Right shoulder pain  Stiffness of joint, shoulder region, right      Subjective Assessment - 01/23/15 0844    Subjective Really sore when I sleep, it wakes me up and it is my hand and fingers that hurt   Pertinent History Right RC repair 11/21/14   Currently in Pain? Yes   Pain Score 5    Pain Location Shoulder   Pain Orientation Right   Pain Descriptors / Indicators Aching;Tingling   Pain Type Surgical pain   Pain Onset More than a month ago   Pain Frequency Constant                       [facility] Adult PT Treatment/Exercise - 01/23/15 0001    Shoulder Exercises: Supine   Protraction AAROM;Right;15 reps   External Rotation 10 reps   Other Supine Exercises dowel rod all motions some help   Other Supine Exercises cane ex ER,ext and IR 15 times each   Shoulder Exercises: Sidelying   External Rotation AROM;10 reps;Weights;Right   External Rotation Weight (lbs) 2#   Internal Rotation Strengthening   Internal Rotation Weight (lbs) 2#   Flexion AROM;Strengthening   Flexion Weight (lbs) 1#  wall slides   Shoulder Exercises: Standing   Flexion AAROM;Right;10 reps   Other Standing Exercises pillowcase wall slide with retract/lower   Other Standing Exercises 3# bicep curls 2x10   Shoulder Exercises: Therapy Ball   Other Therapy Ball Exercises cw and ccw   Shoulder Exercises: ROM/Strengthening   UBE (Upper Arm Bike) 4 minutes   Cybex Row 3 plate   "W" Arms no weight   X to V Arms no weight   Other ROM/Strengthening Exercises seated row 15#   Other ROM/Strengthening Exercises lat pulls 15#   Modalities   Modalities Cryotherapy;Electrical Stimulation   Cryotherapy   Number Minutes Cryotherapy 15 Minutes   Cryotherapy Location Shoulder   Type of Cryotherapy Ice pack   Electrical Stimulation   Electrical Stimulation Location RT shld   Electrical Stimulation Parameters IFC   Electrical Stimulation Goals Pain   Manual Therapy   Manual Therapy Passive ROM   Passive ROM all passive motions in supine to pain rangewith measurements taken for MD note                  PT Sarvis Term Goals - 12/07/14 1020    PT Bucio TERM GOAL #1   Title independent with HEP   Time  4   Period Weeks   Status Achieved           PT Long Term Goals - 12/19/14 0847    PT LONG TERM GOAL #1   Title independent with HEP   Time 8   Status On-going   PT LONG TERM GOAL #2   Title independent with RICE   Time 8   Period Weeks   Status Achieved   PT LONG TERM GOAL #3   Title increase ROM of the right shoulder to 130 degrees flexion   Time 8   Period Weeks   Status On-going   PT LONG TERM GOAL #4   Title dress and do hair without difficulty   Time 8   Period Weeks   Status On-going               Plan - 01/23/15 0927    Clinical Impression Statement Doing better with ROM and function, still tight and weak   Pt will benefit from skilled therapeutic intervention in order to improve on the following deficits Decreased range of motion;Increased muscle spasms;Increased  edema;Impaired flexibility;Pain;Postural dysfunction   Clinical Impairments Affecting Rehab Potential all ADLs   PT Frequency 2x / week   PT Duration 4 weeks   PT Next Visit Plan Next visit, write MD note, measure ROM, do recertification        Problem List Patient Active Problem List   Diagnosis Date Noted  . DYSURIA 06/13/2010  . VERTIGO 02/16/2009  . VAGINITIS, ATROPHIC, POSTMENOPAUSAL 12/06/2008  . UTI 01/24/2008  . UPPER RESPIRATORY INFECTION, VIRAL 08/23/2007  . Allergic rhinitis 06/02/2007  . Disorder of bone and cartilage 06/02/2007    Jearld Lesch, PT 01/23/2015, 9:28 AM  [facility] [place] [address] [place], [place], [address] Phone: [phone]   Fax:  [phone]

## 2015-01-25 ENCOUNTER — Encounter: Payer: Self-pay | Admitting: Physical Therapy

## 2015-01-25 ENCOUNTER — Ambulatory Visit: Payer: Medicare Other | Admitting: Physical Therapy

## 2015-01-25 DIAGNOSIS — M7541 Impingement syndrome of right shoulder: Secondary | ICD-10-CM | POA: Diagnosis not present

## 2015-01-25 DIAGNOSIS — M25511 Pain in right shoulder: Secondary | ICD-10-CM

## 2015-01-25 DIAGNOSIS — M25611 Stiffness of right shoulder, not elsewhere classified: Secondary | ICD-10-CM

## 2015-01-25 NOTE — Therapy (Signed)
[facility] [place] [address] [place], [place], [address] Phone: [phone]   Fax:  [phone]  Physical Therapy Treatment  Patient Details  Name: Nancy Neal MRN: [id number] Date of Birth: 1932/05/29 Referring Provider:  Gordy Savers, MD  Encounter Date: 01/25/2015      PT End of Session - 01/25/15 0911    Visit Number 16   Date for PT Re-Evaluation 02/03/15   PT Start Time 0805   PT Stop Time 0912   PT Time Calculation (min) 67 min   Activity Tolerance Patient tolerated treatment well   Behavior During Therapy [facility] for tasks assessed/performed      Past Medical History  Diagnosis Date  . ALLERGIC RHINITIS 06/02/2007  . OSTEOPENIA 06/02/2007  . VAGINITIS, ATROPHIC, POSTMENOPAUSAL 12/06/2008  . VERTIGO 02/16/2009  . Glaucoma 1-12    Past Surgical History  Procedure Laterality Date  . Abdominal hysterectomy    . Eye surgery      for glaucoma    There were no vitals filed for this visit.  Visit Diagnosis:  Right shoulder pain  Stiffness of joint, shoulder region, right      Subjective Assessment - 01/25/15 0808    Subjective I slept great last night.  The night before wasn't.  I think it's improving.   Currently in Pain? Yes   Pain Score 4             [facility] PT Assessment - 01/25/15 0001    ROM / Strength   AROM / PROM / Strength AROM   AROM   AROM Assessment Site Shoulder   Right/Left Shoulder Right   Right Shoulder Flexion 129 Degrees   Right Shoulder ABduction 114 Degrees                   [facility] Adult PT Treatment/Exercise - 01/25/15 0001    Exercises   Exercises Shoulder   Shoulder Exercises: Seated   Row 10 reps  2 sets   Row Weight (lbs) 15   Other Seated Exercises lat pull 15# 2x10   Shoulder Exercises: Standing   Flexion AROM  pillowcase slides with retract and lower   Shoulder Exercises: Therapy Ball   Other Therapy Ball Exercises cw and ccw   Shoulder  Exercises: ROM/Strengthening   UBE (Upper Arm Bike) 4 minutes   Shoulder Exercises: Stretch   Other Shoulder Stretches dow rod IR behind back   Modalities   Modalities Cryotherapy;Electrical Stimulation   Cryotherapy   Number Minutes Cryotherapy 15 Minutes   Cryotherapy Location Shoulder   Type of Cryotherapy Ice pack   Electrical Stimulation   Electrical Stimulation Location RT shld   Electrical Stimulation Parameters IFC   Electrical Stimulation Goals Pain   Manual Therapy   Manual Therapy Passive ROM   Passive ROM all passive motions in supine to pain rangewith measurements taken for MD note                  PT Beverley Term Goals - 12/07/14 1020    PT Tipler TERM GOAL #1   Title independent with HEP   Time 4   Period Weeks   Status Achieved           PT Long Term Goals - 12/19/14 0847    PT LONG TERM GOAL #1   Title independent with HEP   Time 8   Status On-going   PT LONG TERM GOAL #2  Title independent with RICE   Time 8   Period Weeks   Status Achieved   PT LONG TERM GOAL #3   Title increase ROM of the right shoulder to 130 degrees flexion   Time 8   Period Weeks   Status On-going   PT LONG TERM GOAL #4   Title dress and do hair without difficulty   Time 8   Period Weeks   Status On-going               Plan - 01/25/15 0914    Clinical Impression Statement Progressing well with protocol.   Pt will benefit from skilled therapeutic intervention in order to improve on the following deficits Decreased range of motion;Increased muscle spasms;Increased edema;Impaired flexibility;Pain;Postural dysfunction   Rehab Potential Good   PT Frequency 2x / week   PT Duration 4 weeks   PT Treatment/Interventions Electrical Stimulation;Ultrasound;Moist Heat;Therapeutic exercise;Therapeutic activities;Neuromuscular re-education;Manual techniques;Patient/family education;Passive range of motion   PT Next Visit Plan Continue with POC.   Consulted and  Agree with Plan of Care Patient        Problem List Patient Active Problem List   Diagnosis Date Noted  . DYSURIA 06/13/2010  . VERTIGO 02/16/2009  . VAGINITIS, ATROPHIC, POSTMENOPAUSAL 12/06/2008  . UTI 01/24/2008  . UPPER RESPIRATORY INFECTION, VIRAL 08/23/2007  . Allergic rhinitis 06/02/2007  . Disorder of bone and cartilage 06/02/2007    Tauni Sanks PTA 01/25/2015, 9:14 AM  [facility] [place] [address] [place], [place], [address] Phone: [phone]   Fax:  [phone]

## 2015-01-26 DIAGNOSIS — M19011 Primary osteoarthritis, right shoulder: Secondary | ICD-10-CM | POA: Diagnosis not present

## 2015-01-26 DIAGNOSIS — Z4789 Encounter for other orthopedic aftercare: Secondary | ICD-10-CM | POA: Diagnosis not present

## 2015-01-26 DIAGNOSIS — M792 Neuralgia and neuritis, unspecified: Secondary | ICD-10-CM | POA: Diagnosis not present

## 2015-01-26 DIAGNOSIS — M75121 Complete rotator cuff tear or rupture of right shoulder, not specified as traumatic: Secondary | ICD-10-CM | POA: Diagnosis not present

## 2015-01-29 DIAGNOSIS — H4011X1 Primary open-angle glaucoma, mild stage: Secondary | ICD-10-CM | POA: Diagnosis not present

## 2015-01-29 DIAGNOSIS — H4011X2 Primary open-angle glaucoma, moderate stage: Secondary | ICD-10-CM | POA: Diagnosis not present

## 2015-01-30 ENCOUNTER — Encounter: Payer: Self-pay | Admitting: Physical Therapy

## 2015-01-30 ENCOUNTER — Ambulatory Visit: Payer: Medicare Other | Admitting: Physical Therapy

## 2015-01-30 DIAGNOSIS — M25611 Stiffness of right shoulder, not elsewhere classified: Secondary | ICD-10-CM

## 2015-01-30 DIAGNOSIS — M7541 Impingement syndrome of right shoulder: Secondary | ICD-10-CM | POA: Diagnosis not present

## 2015-01-30 DIAGNOSIS — M25511 Pain in right shoulder: Secondary | ICD-10-CM

## 2015-01-30 NOTE — Therapy (Signed)
[facility] [place] [address] [place], [place], [address] Phone: [phone]   Fax:  [phone]  Physical Therapy Treatment  Patient Details  Name: Nancy Neal MRN: [id number] Date of Birth: November 25, 1931 Referring Provider:  Eugenia Mcalpine, MD  Encounter Date: 01/30/2015      PT End of Session - 01/30/15 0843    Visit Number 17   Date for PT Re-Evaluation 02/03/15   PT Start Time 0800   PT Stop Time 0904   PT Time Calculation (min) 64 min      Past Medical History  Diagnosis Date  . ALLERGIC RHINITIS 06/02/2007  . OSTEOPENIA 06/02/2007  . VAGINITIS, ATROPHIC, POSTMENOPAUSAL 12/06/2008  . VERTIGO 02/16/2009  . Glaucoma 1-12    Past Surgical History  Procedure Laterality Date  . Abdominal hysterectomy    . Eye surgery      for glaucoma    There were no vitals filed for this visit.  Visit Diagnosis:  Right shoulder pain  Stiffness of joint, shoulder region, right      Subjective Assessment - 01/30/15 0800    Subjective I am having sometingling in the 2nd and 3rd fingers gain, worse last night.   Pertinent History Right RC repair 11/21/14   Patient Stated Goals dress and do hair without pain   Currently in Pain? Yes   Pain Score 3    Pain Location Shoulder   Pain Orientation Right   Pain Descriptors / Indicators Aching;Tingling   Pain Type Surgical pain   Pain Onset More than a month ago   Pain Frequency Constant   Aggravating Factors  still worse at night   Pain Relieving Factors rest, ice and the electrical stimulation                       [facility] Adult PT Treatment/Exercise - 01/30/15 0001    Shoulder Exercises: Seated   Other Seated Exercises lat pull 15# 2x10   Other Seated Exercises seated row 15#   Shoulder Exercises: Sidelying   Internal Rotation Weight (lbs) 2#   Flexion Weight (lbs) 1# wall slides with circles small and big, also scaption   Shoulder Exercises: Standing    Other Standing Exercises pillowcase wall slide with retract/lower   Other Standing Exercises 3# bicep curls 2x10   Shoulder Exercises: ROM/Strengthening   UBE (Upper Arm Bike) level 4  4 minutes   "W" Arms 1#   X to V Arms 1#   Rhythmic Stabilization, Seated 2# ball instanding   Other ROM/Strengthening Exercises seated row 15#   Other ROM/Strengthening Exercises lat pulls 15#   Shoulder Exercises: Stretch   Corner Stretch 5 reps;20 seconds   Internal Rotation Stretch 10 seconds   Wall Stretch - ABduction 4 reps;20 seconds   Star Gazer Stretch 4 reps;20 seconds   Shoulder Exercises: Power Camera operator 10 reps  sidelying with 2#   ABduction 20 reps  standing and sidelying   Programme researcher, broadcasting/film/video Location RT shld   Electrical Stimulation Parameters IFC   Electrical Stimulation Goals Pain   Manual Therapy   Manual Therapy Passive ROM   Passive ROM all passive motions in supine to pain rangewith measurements taken for MD note                PT Education - 01/30/15 [phone]    Education provided Yes   Education Details changed HEP took away  the isometrics, had her focus on ER motions and strength above shoulder height   Person(s) Educated Patient   Methods Explanation;Demonstration;Handout   Comprehension Verbalized understanding;Returned demonstration          PT Scholler Term Goals - 12/07/14 1020    PT Schueller TERM GOAL #1   Title independent with HEP   Time 4   Period Weeks   Status Achieved           PT Long Term Goals - 12/19/14 0847    PT LONG TERM GOAL #1   Title independent with HEP   Time 8   Status On-going   PT LONG TERM GOAL #2   Title independent with RICE   Time 8   Period Weeks   Status Achieved   PT LONG TERM GOAL #3   Title increase ROM of the right shoulder to 130 degrees flexion   Time 8   Period Weeks   Status On-going   PT LONG TERM GOAL #4   Title dress and do hair without difficulty   Time 8    Period Weeks   Status On-going               Plan - 01/30/15 0843    Clinical Impression Statement See how new exercises go   Pt will benefit from skilled therapeutic intervention in order to improve on the following deficits Decreased range of motion;Increased muscle spasms;Increased edema;Impaired flexibility;Pain;Postural dysfunction   Rehab Potential Good   PT Frequency 2x / week   PT Duration 4 weeks   PT Treatment/Interventions Electrical Stimulation;Ultrasound;Moist Heat;Therapeutic exercise;Therapeutic activities;Neuromuscular re-education;Manual techniques;Patient/family education;Passive range of motion   PT Next Visit Plan Continue with POC.   Consulted and Agree with Plan of Care Patient        Problem List Patient Active Problem List   Diagnosis Date Noted  . DYSURIA 06/13/2010  . VERTIGO 02/16/2009  . VAGINITIS, ATROPHIC, POSTMENOPAUSAL 12/06/2008  . UTI 01/24/2008  . UPPER RESPIRATORY INFECTION, VIRAL 08/23/2007  . Allergic rhinitis 06/02/2007  . Disorder of bone and cartilage 06/02/2007    Jearld Lesch, PT 01/30/2015, 8:44 AM  [facility] [place] [address] [place], [place], [address] Phone: [phone]   Fax:  [phone]

## 2015-02-02 ENCOUNTER — Encounter: Payer: Self-pay | Admitting: Physical Therapy

## 2015-02-02 ENCOUNTER — Ambulatory Visit: Payer: Medicare Other | Admitting: Physical Therapy

## 2015-02-02 DIAGNOSIS — M25511 Pain in right shoulder: Secondary | ICD-10-CM

## 2015-02-02 DIAGNOSIS — M25611 Stiffness of right shoulder, not elsewhere classified: Secondary | ICD-10-CM

## 2015-02-02 DIAGNOSIS — M7541 Impingement syndrome of right shoulder: Secondary | ICD-10-CM | POA: Diagnosis not present

## 2015-02-02 NOTE — Therapy (Signed)
[facility] [place] [address] [place], [place], [address] Phone: [phone]   Fax:  [phone]  Physical Therapy Treatment  Patient Details  Name: Nancy Neal MRN: [id number] Date of Birth: [birth year] Referring Provider:  Eugenia Mcalpine, MD  Encounter Date: 02/02/2015      PT End of Session - 02/02/15 0924    Visit Number 18   Date for PT Re-Evaluation 02/03/15   PT Start Time 0843   PT Stop Time 0942   PT Time Calculation (min) 59 min      Past Medical History  Diagnosis Date  . ALLERGIC RHINITIS 06/02/2007  . OSTEOPENIA 06/02/2007  . VAGINITIS, ATROPHIC, POSTMENOPAUSAL 12/06/2008  . VERTIGO 02/16/2009  . Glaucoma 1-12    Past Surgical History  Procedure Laterality Date  . Abdominal hysterectomy    . Eye surgery      for glaucoma    There were no vitals filed for this visit.  Visit Diagnosis:  Right shoulder pain  Stiffness of joint, shoulder region, right      Subjective Assessment - 02/02/15 0849    Subjective Still c/o finger numbness.  I like the new exercises   Currently in Pain? Yes   Pain Score 2    Pain Location Shoulder   Pain Orientation Right   Pain Descriptors / Indicators Aching;Tingling   Pain Type Surgical pain   Pain Onset More than a month ago   Pain Frequency Constant   Aggravating Factors  night   Pain Relieving Factors rest            [facility] PT Assessment - 02/02/15 0001    AROM   Right Shoulder Flexion 131 Degrees   Right Shoulder ABduction 114 Degrees                   [facility] Adult PT Treatment/Exercise - 02/02/15 0001    Shoulder Exercises: Supine   External Rotation 10 reps   Theraband Level (Shoulder External Rotation) Level 1 (Yellow)   Flexion 10 reps   Theraband Level (Shoulder Flexion) Level 1 (Yellow)   ABduction 20 reps   Theraband Level (Shoulder ABduction) Level 1 (Yellow)   Shoulder Exercises: Seated   Elevation Weight (lbs) 4# shruggs  10 times   Other Seated Exercises lat pull 15# 2x10   Other Seated Exercises seated row 15#   Shoulder Exercises: Sidelying   External Rotation AROM;10 reps;Weights;Right   Internal Rotation Weight (lbs) 2#   Shoulder Exercises: Standing   ABduction Limitations wall ladder, scaption   Other Standing Exercises pillowcase wall slide with retract/lower   Other Standing Exercises 3# bicep curls 2x10   Shoulder Exercises: Therapy Ball   Other Therapy Ball Exercises cw and ccw   Other Therapy Ball Exercises ball rolling up wall, wall slides with pillow case    Shoulder Exercises: ROM/Strengthening   UBE (Upper Arm Bike) level 4  4 minutes   "W" Arms 1#   X to V Arms 1#   Rhythmic Stabilization, Seated 2# ball instanding   Shoulder Exercises: Stretch   Corner Stretch 5 reps;20 seconds   Star Gazer Stretch 4 reps;20 seconds   Other Shoulder Stretches finger ladder 8 times flexion and abd   Cryotherapy   Number Minutes Cryotherapy 15 Minutes   Cryotherapy Location Shoulder   Type of Cryotherapy Ice pack   Electrical Stimulation   Electrical Stimulation Location RT shld   Electrical Stimulation Parameters IFC   Electrical  Stimulation Goals Pain                  PT [place] Term Goals - 12/07/14 1020    PT [place] TERM GOAL #1   Title independent with HEP   Time 4   Period Weeks   Status Achieved           PT Long Term Goals - 12/19/14 0847    PT LONG TERM GOAL #1   Title independent with HEP   Time 8   Status On-going   PT LONG TERM GOAL #2   Title independent with RICE   Time 8   Period Weeks   Status Achieved   PT LONG TERM GOAL #3   Title increase ROM of the right shoulder to 130 degrees flexion   Time 8   Period Weeks   Status On-going   PT LONG TERM GOAL #4   Title dress and do hair without difficulty   Time 8   Period Weeks   Status On-going               Plan - 02/02/15 0925    Clinical Impression Statement Doing better, very weak  abduction   PT Next Visit Plan Continue with POC.        Problem List Patient Active Problem List   Diagnosis Date Noted  . DYSURIA 06/13/2010  . VERTIGO 02/16/2009  . VAGINITIS, ATROPHIC, POSTMENOPAUSAL 12/06/2008  . UTI 01/24/2008  . UPPER RESPIRATORY INFECTION, VIRAL 08/23/2007  . Allergic rhinitis 06/02/2007  . Disorder of bone and cartilage 06/02/2007    Jearld Lesch, PT 02/02/2015, 9:26 AM  [facility] [place] [address] [place], [place], [address] Phone: [phone]   Fax:  [phone]

## 2015-02-05 ENCOUNTER — Ambulatory Visit: Payer: Medicare Other | Admitting: Physical Therapy

## 2015-02-05 ENCOUNTER — Encounter: Payer: Self-pay | Admitting: Physical Therapy

## 2015-02-05 DIAGNOSIS — M25611 Stiffness of right shoulder, not elsewhere classified: Secondary | ICD-10-CM

## 2015-02-05 DIAGNOSIS — M7541 Impingement syndrome of right shoulder: Secondary | ICD-10-CM | POA: Diagnosis not present

## 2015-02-05 DIAGNOSIS — M25511 Pain in right shoulder: Secondary | ICD-10-CM

## 2015-02-05 NOTE — Therapy (Signed)
[facility] [place] [address] [place], [place], [address] Phone: [phone]   Fax:  [phone]  Physical Therapy Treatment  Patient Details  Name: Nancy Neal MRN: [id number] Date of Birth: [birth year] Referring Provider:  Eugenia Mcalpine, MD  Encounter Date: 02/05/2015      PT End of Session - 02/05/15 0925    Visit Number 19   Date for PT Re-Evaluation 02/03/15   PT Start Time 0845   PT Stop Time 0945   PT Time Calculation (min) 60 min      Past Medical History  Diagnosis Date  . ALLERGIC RHINITIS 06/02/2007  . OSTEOPENIA 06/02/2007  . VAGINITIS, ATROPHIC, POSTMENOPAUSAL 12/06/2008  . VERTIGO 02/16/2009  . Glaucoma 1-12    Past Surgical History  Procedure Laterality Date  . Abdominal hysterectomy    . Eye surgery      for glaucoma    There were no vitals filed for this visit.  Visit Diagnosis:  Right shoulder pain  Stiffness of joint, shoulder region, right      Subjective Assessment - 02/05/15 0847    Subjective I think I am doing better, less pain and was able to do more yardwork this weekend   Pertinent History Right RC repair 11/21/14   Limitations Writing;House hold activities;Other (comment)   Currently in Pain? Yes   Pain Score 2    Pain Location Shoulder   Pain Orientation Right   Pain Descriptors / Indicators Aching;Tingling   Pain Type Surgical pain                       [facility] Adult PT Treatment/Exercise - 02/05/15 0001    Shoulder Exercises: Supine   ABduction 20 reps   Shoulder Exercises: Seated   Elevation Weight (lbs) 4# shruggs 10 times   Other Seated Exercises lat pull 15# 2x10   Other Seated Exercises seated row 15#   Shoulder Exercises: Sidelying   Internal Rotation Weight (lbs) 2#   Shoulder Exercises: Standing   External Rotation Strengthening;20 reps   Theraband Level (Shoulder External Rotation) Level 2 (Red)   ABduction Strengthening;Right;10 reps   Theraband Level (Shoulder ABduction) Level 1 (Yellow)   Other Standing Exercises pillowcase wall slide with retract/lower   Other Standing Exercises 3# bicep curls 2x10   Shoulder Exercises: Therapy Ball   Other Therapy Ball Exercises cw and ccw   Other Therapy Ball Exercises ball rolling up wall, wall slides with pillow case    Shoulder Exercises: ROM/Strengthening   UBE (Upper Arm Bike) level 4  5 minutes   "W" Arms 1#   X to V Arms 1#   Rhythmic Stabilization, Seated 2# ball instanding   Other ROM/Strengthening Exercises seated row 15#   Other ROM/Strengthening Exercises lat pulls 15#   Shoulder Exercises: Stretch   Corner Stretch 5 reps;20 seconds   Internal Rotation Stretch 10 seconds   Wall Stretch - ABduction 4 reps;20 seconds   Star Gazer Stretch 4 reps;20 seconds   Other Shoulder Stretches finger ladder 8 times flexion and abd   Shoulder Exercises: Power Camera operator 10 reps   ABduction 20 reps   Shoulder Exercises: Body Blade   Other Body Blade Exercises 1 seconds each motion   Cryotherapy   Number Minutes Cryotherapy 15 Minutes   Cryotherapy Location Shoulder   Type of Cryotherapy Ice pack   Electrical Stimulation   Electrical Stimulation Location RT shld  Electrical Stimulation Parameters IFC   Electrical Stimulation Goals Pain                  PT Jeter Term Goals - 12/07/14 1020    PT Hott TERM GOAL #1   Title independent with HEP   Time 4   Period Weeks   Status Achieved           PT Long Term Goals - 12/19/14 0847    PT LONG TERM GOAL #1   Title independent with HEP   Time 8   Status On-going   PT LONG TERM GOAL #2   Title independent with RICE   Time 8   Period Weeks   Status Achieved   PT LONG TERM GOAL #3   Title increase ROM of the right shoulder to 130 degrees flexion   Time 8   Period Weeks   Status On-going   PT LONG TERM GOAL #4   Title dress and do hair without difficulty   Time 8   Period Weeks    Status On-going               Plan - 02/05/15 0925    Clinical Impression Statement I feel better, like I can do more, still can't blow dry my hair   Pt will benefit from skilled therapeutic intervention in order to improve on the following deficits Decreased range of motion;Increased muscle spasms;Increased edema;Impaired flexibility;Pain;Postural dysfunction   PT Frequency 2x / week   PT Duration 4 weeks   Consulted and Agree with Plan of Care Patient        Problem List Patient Active Problem List   Diagnosis Date Noted  . DYSURIA 06/13/2010  . VERTIGO 02/16/2009  . VAGINITIS, ATROPHIC, POSTMENOPAUSAL 12/06/2008  . UTI 01/24/2008  . UPPER RESPIRATORY INFECTION, VIRAL 08/23/2007  . Allergic rhinitis 06/02/2007  . Disorder of bone and cartilage 06/02/2007    Jearld Lesch, PT 02/05/2015, 9:26 AM  [facility] [place] [address] [place], [place], [address] Phone: [phone]   Fax:  [phone]

## 2015-02-09 ENCOUNTER — Ambulatory Visit: Payer: Medicare Other | Admitting: Physical Therapy

## 2015-02-09 ENCOUNTER — Encounter: Payer: Self-pay | Admitting: Physical Therapy

## 2015-02-09 DIAGNOSIS — M25611 Stiffness of right shoulder, not elsewhere classified: Secondary | ICD-10-CM

## 2015-02-09 DIAGNOSIS — M25511 Pain in right shoulder: Secondary | ICD-10-CM

## 2015-02-09 DIAGNOSIS — M7541 Impingement syndrome of right shoulder: Secondary | ICD-10-CM | POA: Diagnosis not present

## 2015-02-09 NOTE — Therapy (Signed)
[facility] [place] [address] [place], [place], [address] Phone: [phone]   Fax:  [phone]  Physical Therapy Treatment  Patient Details  Name: Nancy Neal MRN: [id number] Date of Birth: 1932-04-26 Referring Provider:  Eugenia Mcalpine, MD  Encounter Date: 02/09/2015      PT End of Session - 02/09/15 0845    Visit Number 20   Date for PT Re-Evaluation 02/03/15   PT Start Time 0815   PT Stop Time 0846   PT Time Calculation (min) 31 min      Past Medical History  Diagnosis Date  . ALLERGIC RHINITIS 06/02/2007  . OSTEOPENIA 06/02/2007  . VAGINITIS, ATROPHIC, POSTMENOPAUSAL 12/06/2008  . VERTIGO 02/16/2009  . Glaucoma 1-12    Past Surgical History  Procedure Laterality Date  . Abdominal hysterectomy    . Eye surgery      for glaucoma    There were no vitals filed for this visit.  Visit Diagnosis:  Right shoulder pain - Plan: PT plan of care cert/re-cert  Stiffness of joint, shoulder region, right - Plan: PT plan of care cert/re-cert      Subjective Assessment - 02/09/15 0817    Subjective I am late, had workers at my house.  feeling pretty good with my shoulder, reports pain in the hand   Pertinent History Right RC repair 11/21/14   Patient Stated Goals dress and do hair without pain   Currently in Pain? Yes   Pain Score 6    Pain Location Hand   Pain Orientation Right   Pain Descriptors / Indicators Aching;Burning   Pain Type Surgical pain   Pain Onset More than a month ago   Aggravating Factors  night is worst   Pain Relieving Factors movements            [facility] PT Assessment - 02/09/15 0001    AROM   Right Shoulder Flexion 140 Degrees   Right Shoulder ABduction 122 Degrees                     [facility] Adult PT Treatment/Exercise - 02/09/15 0001    Shoulder Exercises: Seated   Other Seated Exercises lat pull 20# 2x10   Other Seated Exercises seated row 20#   Shoulder Exercises:  Standing   Theraband Level (Shoulder External Rotation) Level 2 (Red)   Theraband Level (Shoulder ABduction) Level 1 (Yellow)   ABduction Limitations wall ladder, scaption   Other Standing Exercises pillowcase wall slide with retract/lower and 1# weight    Other Standing Exercises 1# UE circuit   Shoulder Exercises: Therapy Ball   Other Therapy Ball Exercises cw and ccw   Other Therapy Ball Exercises ball rolling up wall, wall slides with pillow case    Shoulder Exercises: ROM/Strengthening   UBE (Upper Arm Bike) level 4  5 minutes   "W" Arms 1#   X to V Arms 1#   Rhythmic Stabilization, Seated 2# ball instanding   Other ROM/Strengthening Exercises ball throwing, 2# cabinet reaching   Other ROM/Strengthening Exercises isometric abduction circles in standing   Shoulder Exercises: Stretch   Corner Stretch 5 reps;20 seconds   Wall Stretch - ABduction 4 reps;20 seconds                  PT [place] Term Goals - 12/07/14 1020    PT [place] TERM GOAL #1   Title independent with HEP   Time 4   Period  Weeks   Status Achieved           PT Long Term Goals - 12/19/14 0847    PT LONG TERM GOAL #1   Title independent with HEP   Time 8   Status On-going   PT LONG TERM GOAL #2   Title independent with RICE   Time 8   Period Weeks   Status Achieved   PT LONG TERM GOAL #3   Title increase ROM of the right shoulder to 130 degrees flexion   Time 8   Period Weeks   Status On-going   PT LONG TERM GOAL #4   Title dress and do hair without difficulty   Time 8   Period Weeks   Status On-going               Plan - February 10, 2015 0845    Clinical Impression Statement Patient continues to be functionally weak, she is demonstrating better motions but is unable to blow dry hair, weakness in mostly abduction   PT Next Visit Plan work on abduction strength and functional situations   Consulted and Agree with Plan of Care Patient          G-Codes - 02-10-2015 0847    Functional  Assessment Tool Used FOTO   Functional Limitation Self care   Self Care Current Status [phone]) At least 40 percent but less than 60 percent impaired, limited or restricted   Self Care Goal Status (L9532) At least 40 percent but less than 60 percent impaired, limited or restricted      Problem List Patient Active Problem List   Diagnosis Date Noted  . DYSURIA 06/13/2010  . VERTIGO 02/16/2009  . VAGINITIS, ATROPHIC, POSTMENOPAUSAL 12/06/2008  . UTI 01/24/2008  . UPPER RESPIRATORY INFECTION, VIRAL 08/23/2007  . Allergic rhinitis 06/02/2007  . Disorder of bone and cartilage 06/02/2007    Jearld Lesch, PT 02/10/2015, 8:54 AM  [facility] [place] [address] [place], [place], [address] Phone: [phone]   Fax:  [phone]

## 2015-02-12 ENCOUNTER — Encounter: Payer: Self-pay | Admitting: Physical Therapy

## 2015-02-12 ENCOUNTER — Ambulatory Visit: Payer: Medicare Other | Admitting: Physical Therapy

## 2015-02-12 DIAGNOSIS — M25511 Pain in right shoulder: Secondary | ICD-10-CM

## 2015-02-12 DIAGNOSIS — M7541 Impingement syndrome of right shoulder: Secondary | ICD-10-CM | POA: Diagnosis not present

## 2015-02-12 DIAGNOSIS — M25611 Stiffness of right shoulder, not elsewhere classified: Secondary | ICD-10-CM

## 2015-02-12 NOTE — Therapy (Signed)
[facility] [place] [address] [place], [place], [address] Phone: [phone]   Fax:  [phone]  Physical Therapy Treatment  Patient Details  Name: Nancy Neal MRN: [id number] Date of Birth: May 08, 1932 Referring Provider:  Gordy Savers, MD  Encounter Date: 02/12/2015      PT End of Session - 02/12/15 1037    Visit Number 21   Date for PT Re-Evaluation 03/05/15   PT Start Time 0840   PT Stop Time 0945   PT Time Calculation (min) 65 min      Past Medical History  Diagnosis Date  . ALLERGIC RHINITIS 06/02/2007  . OSTEOPENIA 06/02/2007  . VAGINITIS, ATROPHIC, POSTMENOPAUSAL 12/06/2008  . VERTIGO 02/16/2009  . Glaucoma 1-12    Past Surgical History  Procedure Laterality Date  . Abdominal hysterectomy    . Eye surgery      for glaucoma    There were no vitals filed for this visit.  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 Yes   Pain Score 3    Pain Location Hand   Pain Orientation Right   Pain Descriptors / Indicators Tingling   Pain Type Surgical pain   Pain Onset More than a month ago   Pain Frequency Constant                         [facility] Adult PT Treatment/Exercise - 02/12/15 0001    Shoulder Exercises: Supine   ABduction 20 reps   Shoulder Exercises: Seated   Other Seated Exercises lat pull 20# 2x15   Other Seated Exercises seated row 20#   Shoulder Exercises: Sidelying   External Rotation Weight (lbs) 2#   Internal Rotation Weight (lbs) 2#   Shoulder Exercises: Standing   ABduction Limitations wall ladder, scaption   Other Standing Exercises pillowcase wall slide with retract/lower and 1# weight    Other Standing Exercises 1# UE circuit   Shoulder Exercises:  Therapy Ball   Other Therapy Ball Exercises cw and ccw   Other Therapy Ball Exercises ball rolling up wall, wall slides with pillow case    Shoulder Exercises: ROM/Strengthening   UBE (Upper Arm Bike) level 4  5 minutes   "W" Arms 1#   X to V Arms 1#   Rhythmic Stabilization, Seated 2# ball instanding   Other ROM/Strengthening Exercises ball throwing, 2# cabinet reaching   Other ROM/Strengthening Exercises isometric abduction circles in standing   Shoulder Exercises: Stretch   Star Gazer Stretch 4 reps;20 seconds   Other Shoulder Stretches finger ladder 8 times flexion and abd   Shoulder Exercises: Power Tower   ABduction 20 reps   Cryotherapy   Number Minutes Cryotherapy 15 Minutes   Cryotherapy Location Shoulder   Type of Cryotherapy Ice pack   Electrical Stimulation   Electrical Stimulation Location RT shld   Electrical Stimulation Parameters IFC   Electrical Stimulation Goals Pain                  PT [place] Term Goals - 12/07/14 1020    PT Brueggemann TERM GOAL #1   Title independent with HEP   Time 4  Period Weeks   Status Achieved           PT Long Term Goals - 12/19/14 0847    PT LONG TERM GOAL #1   Title independent with HEP   Time 8   Status On-going   PT LONG TERM GOAL #2   Title independent with RICE   Time 8   Period Weeks   Status Achieved   PT LONG TERM GOAL #3   Title increase ROM of the right shoulder to 130 degrees flexion   Time 8   Period Weeks   Status On-going   PT LONG TERM GOAL #4   Title dress and do hair without difficulty   Time 8   Period Weeks   Status On-going               Plan - 02/12/15 1037    Clinical Impression Statement Still with difficulty doing abduction and doing functional activities due to weakness and fatigue   Consulted and Agree with Plan of Care Patient        Problem List Patient Active Problem List   Diagnosis Date Noted  . DYSURIA 06/13/2010  . VERTIGO 02/16/2009  . VAGINITIS,  ATROPHIC, POSTMENOPAUSAL 12/06/2008  . UTI 01/24/2008  . UPPER RESPIRATORY INFECTION, VIRAL 08/23/2007  . Allergic rhinitis 06/02/2007  . Disorder of bone and cartilage 06/02/2007    Jearld Lesch, PT 02/12/2015, 10:53 AM  [facility] [place] [address] [place], [place], [address] Phone: [phone]   Fax:  [phone]

## 2015-02-15 ENCOUNTER — Ambulatory Visit: Payer: Medicare Other | Admitting: Physical Therapy

## 2015-02-15 ENCOUNTER — Encounter: Payer: Self-pay | Admitting: Physical Therapy

## 2015-02-15 DIAGNOSIS — M25611 Stiffness of right shoulder, not elsewhere classified: Secondary | ICD-10-CM

## 2015-02-15 DIAGNOSIS — M25511 Pain in right shoulder: Secondary | ICD-10-CM

## 2015-02-15 DIAGNOSIS — M7541 Impingement syndrome of right shoulder: Secondary | ICD-10-CM | POA: Diagnosis not present

## 2015-02-15 NOTE — Therapy (Signed)
[place], [place], [address] Phone: [phone]   Fax:  [phone]  Physical Therapy Treatment  Patient Details  Name: Nancy Neal MRN: [id number] Date of Birth: 12-31-1931 Referring Provider:  Marletta Lor, MD  Encounter Date: 02/15/2015      PT End of Session - 02/15/15 0838    Visit Number 22   Date for PT Re-Evaluation 03/05/15   PT Start Time 7169   PT Stop Time 0857   PT Time Calculation (min) 58 min      Past Medical History  Diagnosis Date  . ALLERGIC RHINITIS 06/02/2007  . OSTEOPENIA 06/02/2007  . VAGINITIS, ATROPHIC, POSTMENOPAUSAL 12/06/2008  . VERTIGO 02/16/2009  . Glaucoma 1-12    Past Surgical History  Procedure Laterality Date  . Abdominal hysterectomy    . Eye surgery      for glaucoma    There were no vitals filed for this visit.  Visit Diagnosis:  Right shoulder pain  Stiffness of joint, shoulder region, right      Subjective Assessment - 02/15/15 0800    Subjective C/O pain in the right arm and elbow, reviewed door stretch and it looks like she was doing this wrong and placing some torque on her right elbow, this is corrected now   Currently in Pain? Yes   Pain Score 4    Pain Location Elbow   Pain Orientation Right   Pain Descriptors / Indicators Aching;Sore   Pain Type Surgical pain   Pain Onset More than a month ago   Aggravating Factors  again c/o pain more at night   Pain Relieving Factors ice and estim            [facility] PT Assessment - 02/15/15 0001    AROM   Right Shoulder Flexion 150 Degrees   Right Shoulder ABduction 135 Degrees                     [facility] Adult PT Treatment/Exercise - 02/15/15 0001    Shoulder Exercises: Seated   Other Seated Exercises lat pull 20# 2x15   Other Seated Exercises seated row 20#   Shoulder Exercises: Sidelying   External Rotation Weight (lbs) 2#   Shoulder Exercises: Standing   Other Standing Exercises pillowcase wall slide with retract/lower and 1# weight    Other Standing Exercises 1# UE circuit   Shoulder Exercises: Pulleys   Other Pulley Exercises tricep extension 15#, scapular stabilization 5# 3 ways   Shoulder Exercises: Therapy Ball   Other Therapy Ball Exercises cw and ccw   Other Therapy Ball Exercises ball rolling up wall, wall slides with pillow case    Shoulder Exercises: ROM/Strengthening   UBE (Upper Arm Bike) level 4  5 minutes   "W" Arms 2#   X to V Arms 2#   Rhythmic Stabilization, Seated 2# ball instanding   Other ROM/Strengthening Exercises ball throwing, 2# cabinet reaching   Other ROM/Strengthening Exercises isometric abduction circles in standing   Shoulder Exercises: Stretch   Wall Stretch - ABduction 4 reps;20 seconds   Star Gazer Stretch 4 reps;20 seconds   Other Shoulder Stretches finger ladder 8 times flexion and abd   Shoulder Exercises: Power Futures trader A lot of work for shoulder abduction AROM, AAROM, eccentrics, 1#, 2# and no weight   Cryotherapy   Number Minutes Cryotherapy 15 Minutes   Cryotherapy Location Shoulder  Type of Cryotherapy Ice pack   Electrical Stimulation   Electrical Stimulation Location RT shld   Electrical Stimulation Parameters IFC   Electrical Stimulation Goals Pain                  PT [place] Term Goals - 12/07/14 1020    PT St[place] TERM GOAL #1   Title independent with HEP   Time 4   Period Weeks   Status Achieved           PT Long Term Goals - 02/15/15 8144    PT LONG TERM GOAL #2   Title independent with RICE   Status Achieved   PT LONG TERM GOAL #3   Title increase ROM of the right shoulder to 130 degrees flexion   Status Achieved   PT LONG TERM GOAL #4   Title dress and do hair without difficulty   Status Partially Met               Plan - 02/15/15 0839    Clinical Impression Statement Weakness is functional abduction, great increase in  AROM   PT Next Visit Plan work on abduction strength and functional situations   Consulted and Agree with Plan of Care Patient        Problem List Patient Active Problem List   Diagnosis Date Noted  . DYSURIA 06/13/2010  . VERTIGO 02/16/2009  . VAGINITIS, ATROPHIC, POSTMENOPAUSAL 12/06/2008  . UTI 01/24/2008  . UPPER RESPIRATORY INFECTION, VIRAL 08/23/2007  . Allergic rhinitis 06/02/2007  . Disorder of bone and cartilage 06/02/2007    Sumner Boast, PT 02/15/2015, 8:40 AM  [place], [place], [address] Phone: [phone]   Fax:  [phone]

## 2015-02-19 ENCOUNTER — Encounter: Payer: Self-pay | Admitting: Physical Therapy

## 2015-02-19 ENCOUNTER — Ambulatory Visit: Payer: Medicare Other | Attending: Specialist | Admitting: Physical Therapy

## 2015-02-19 DIAGNOSIS — M7541 Impingement syndrome of right shoulder: Secondary | ICD-10-CM | POA: Diagnosis not present

## 2015-02-19 DIAGNOSIS — M25611 Stiffness of right shoulder, not elsewhere classified: Secondary | ICD-10-CM

## 2015-02-19 DIAGNOSIS — M25511 Pain in right shoulder: Secondary | ICD-10-CM

## 2015-02-19 NOTE — Therapy (Signed)
[place], [place], [address] Phone: [phone]   Fax:  [phone]  Physical Therapy Treatment  Patient Details  Name: Nancy Neal MRN: [id number] Date of Birth: 07-07-1932 Referring Provider:  Marletta Lor, MD  Encounter Date: 02/19/2015      PT End of Session - 02/19/15 0926    Visit Number 23   Date for PT Re-Evaluation 03/05/15   PT Start Time 7867   PT Stop Time 0949   PT Time Calculation (min) 57 min      Past Medical History  Diagnosis Date  . ALLERGIC RHINITIS 06/02/2007  . OSTEOPENIA 06/02/2007  . VAGINITIS, ATROPHIC, POSTMENOPAUSAL 12/06/2008  . VERTIGO 02/16/2009  . Glaucoma 1-12    Past Surgical History  Procedure Laterality Date  . Abdominal hysterectomy    . Eye surgery      for glaucoma    There were no vitals filed for this visit.  Visit Diagnosis:  Right shoulder pain  Stiffness of joint, shoulder region, right      Subjective Assessment - 02/19/15 0853    Subjective Still with the biggest c/o is the hand.   Pertinent History Right RC repair 11/21/14   Patient Stated Goals dress and do hair without pain   Currently in Pain? Yes   Pain Score 2    Pain Location Shoulder  Reports right hand pain and tingling at night   Pain Orientation Right   Pain Descriptors / Indicators Aching   Pain Type Surgical pain   Pain Onset More than a month ago   Pain Frequency Intermittent   Aggravating Factors  worse at night   Pain Relieving Factors estim                         [facility] Adult PT Treatment/Exercise - 02/19/15 0001    Shoulder Exercises: Seated   Other Seated Exercises lat pull 20# 2x15   Other Seated Exercises seated row 20#   Shoulder Exercises: Standing   Other Standing Exercises pillowcase wall slide with retract/lower and 1# weight    Other Standing Exercises 1# UE circuit   Shoulder Exercises: Pulleys   Other Pulley Exercises  tricep extension 15#, scapular stabilization 5# 3 ways   Shoulder Exercises: Therapy Ball   Other Therapy Ball Exercises cw and ccw   Shoulder Exercises: ROM/Strengthening   UBE (Upper Arm Bike) level 4  5 minutes   "W" Arms 2#   X to V Arms 2#   Rhythmic Stabilization, Seated 2# ball instanding   Other ROM/Strengthening Exercises ball throwing, 2# cabinet reaching   Other ROM/Strengthening Exercises isometric abduction circles in standing   Shoulder Exercises: Power [employer]   Other Power [employer] A lot of work for shoulder abduction AROM, AAROM, eccentrics, 1#, 2# and no weight   Other Power Engineer, water 5# chest press, 5# scap stabilization with pulleys 2 ways   Cryotherapy   Number Minutes Cryotherapy 15 Minutes   Cryotherapy Location Shoulder   Type of Cryotherapy Ice pack   Electrical Stimulation   Electrical Stimulation Location RT shld   Electrical Stimulation Parameters IFC   Electrical Stimulation Goals Pain                  PT Kannan Term Goals - 12/07/14 1020    PT Vandiver TERM GOAL #1   Title independent with HEP   Time 4  Period Weeks   Status Achieved           PT Long Term Goals - 02/15/15 0839    PT LONG TERM GOAL #2   Title independent with RICE   Status Achieved   PT LONG TERM GOAL #3   Title increase ROM of the right shoulder to 130 degrees flexion   Status Achieved   PT LONG TERM GOAL #4   Title dress and do hair without difficulty   Status Partially Met               Plan - 02/19/15 0927    Clinical Impression Statement Still c/o pain in the right hand at night, strength is coming, still weak above 80 degrees especially with abduction   PT Next Visit Plan will write MD note next visit   Consulted and Agree with Plan of Care Patient        Problem List Patient Active Problem List   Diagnosis Date Noted  . DYSURIA 06/13/2010  . VERTIGO 02/16/2009  . VAGINITIS, ATROPHIC, POSTMENOPAUSAL 12/06/2008  . UTI  01/24/2008  . UPPER RESPIRATORY INFECTION, VIRAL 08/23/2007  . Allergic rhinitis 06/02/2007  . Disorder of bone and cartilage 06/02/2007    Sumner Boast, PT 02/19/2015, 9:28 AM  [address] [place], [place], [address] Phone: [phone]   Fax:  [phone]

## 2015-02-23 ENCOUNTER — Ambulatory Visit: Payer: Medicare Other | Admitting: Physical Therapy

## 2015-02-23 ENCOUNTER — Encounter: Payer: Self-pay | Admitting: Physical Therapy

## 2015-02-23 DIAGNOSIS — M25611 Stiffness of right shoulder, not elsewhere classified: Secondary | ICD-10-CM

## 2015-02-23 DIAGNOSIS — M25511 Pain in right shoulder: Secondary | ICD-10-CM

## 2015-02-23 DIAGNOSIS — M7541 Impingement syndrome of right shoulder: Secondary | ICD-10-CM | POA: Diagnosis not present

## 2015-02-23 NOTE — Therapy (Signed)
[place] [address], [place], [address] Phone: [phone]   Fax:  [phone]  Physical Therapy Treatment  Patient Details  Name: Nancy Neal MRN: [id number] Date of Birth: 09-Aug-1932 Referring Provider:  Marletta Lor, MD  Encounter Date: 02/23/2015      PT End of Session - 02/23/15 0844    Visit Number 24   Date for PT Re-Evaluation 03/05/15   PT Start Time 0932   PT Stop Time 0900   PT Time Calculation (min) 63 min   Behavior During Therapy [facility] for tasks assessed/performed      Past Medical History  Diagnosis Date  . ALLERGIC RHINITIS 06/02/2007  . OSTEOPENIA 06/02/2007  . VAGINITIS, ATROPHIC, POSTMENOPAUSAL 12/06/2008  . VERTIGO 02/16/2009  . Glaucoma 1-12    Past Surgical History  Procedure Laterality Date  . Abdominal hysterectomy    . Eye surgery      for glaucoma    There were no vitals filed for this visit.  Visit Diagnosis:  Right shoulder pain - Plan: PT plan of care cert/re-cert  Stiffness of joint, shoulder region, right - Plan: PT plan of care cert/re-cert      Subjective Assessment - 02/23/15 0757    Subjective Feeling better, she still has her biggest c/o is pain in the hands at night.  C/O stiffness as well.     Currently in Pain? Yes   Pain Location Hand   Pain Orientation Right   Pain Descriptors / Indicators Aching;Tingling   Pain Type Surgical pain   Pain Onset More than a month ago   Pain Frequency Intermittent   Aggravating Factors  worse at night, especially right hand   Pain Relieving Factors movements            [facility] PT Assessment - 02/23/15 0001    ROM / Strength   AROM / PROM / Strength --  Strength is 3+/5, very weak above 90 degrees flex/abd   AROM   Right Shoulder Flexion 157 Degrees   Right Shoulder ABduction 156 Degrees                     [facility] Adult PT Treatment/Exercise - 02/23/15 0001    Shoulder Exercises: Seated    Other Seated Exercises lat pull 20# 2x15   Other Seated Exercises seated row 20#   Shoulder Exercises: Standing   Other Standing Exercises pillowcase wall slide with retract/lower and 1# weight    Shoulder Exercises: Pulleys   Other Pulley Exercises tricep extension 15#, scapular stabilization 5# 3 ways   Shoulder Exercises: ROM/Strengthening   UBE (Upper Arm Bike) level 5  5 minutes   "W" Arms 2#   X to V Arms 2#   Other ROM/Strengthening Exercises ball throwing, 2# cabinet reaching   Other ROM/Strengthening Exercises isometric abduction circles in standing   Shoulder Exercises: Power [employer]   Other Power [employer] A lot of work for shoulder abduction AROM, AAROM, eccentrics, 1#, 2# and no weight   Other Power Engineer, water 5# chest press, 5# scap stabilization with pulleys 2 ways                  PT Cavness Term Goals - 12/07/14 1020    PT Vacha TERM GOAL #1   Title independent with HEP   Time 4   Period Weeks   Status Achieved  PT Long Term Goals - 02/23/15 0846    PT LONG TERM GOAL #1   Title independent with HEP   Status Partially Met   PT LONG TERM GOAL #2   Title independent with RICE   Status Achieved   PT LONG TERM GOAL #3   Title increase ROM of the right shoulder to 130 degrees flexion   Status Achieved   PT LONG TERM GOAL #4   Title dress and do hair without difficulty   Status Partially Met               Plan - 02/23/15 0844    Clinical Impression Statement Patient has great AROM, she has poor strength above 90 degrees flexion and abduction, has been having difficulty with blow drying hair.   PT Next Visit Plan she will see MD, has very good ROM but still functionally weak   Consulted and Agree with Plan of Care Patient        Problem List Patient Active Problem List   Diagnosis Date Noted  . DYSURIA 06/13/2010  . VERTIGO 02/16/2009  . VAGINITIS, ATROPHIC, POSTMENOPAUSAL 12/06/2008  . UTI 01/24/2008  . UPPER  RESPIRATORY INFECTION, VIRAL 08/23/2007  . Allergic rhinitis 06/02/2007  . Disorder of bone and cartilage 06/02/2007    Sumner Boast, PT 02/23/2015, 8:48 AM  [address] [place], [place], [address] Phone: [phone]   Fax:  [phone]

## 2015-02-26 DIAGNOSIS — Z4789 Encounter for other orthopedic aftercare: Secondary | ICD-10-CM | POA: Diagnosis not present

## 2015-02-27 ENCOUNTER — Ambulatory Visit: Payer: Medicare Other | Admitting: Physical Therapy

## 2015-02-27 ENCOUNTER — Encounter: Payer: Self-pay | Admitting: Physical Therapy

## 2015-02-27 DIAGNOSIS — M25611 Stiffness of right shoulder, not elsewhere classified: Secondary | ICD-10-CM

## 2015-02-27 DIAGNOSIS — M7541 Impingement syndrome of right shoulder: Secondary | ICD-10-CM | POA: Diagnosis not present

## 2015-02-27 DIAGNOSIS — M25511 Pain in right shoulder: Secondary | ICD-10-CM

## 2015-02-27 NOTE — Therapy (Signed)
[place], [place], [address] Phone: [phone]   Fax:  [phone]  Physical Therapy Treatment  Patient Details  Name: Nancy Neal MRN: [id number] Date of Birth: November 28, 1931 Referring Provider:  Sydnee Cabal, MD  Encounter Date: 02/27/2015      PT End of Session - 02/27/15 0918    Visit Number 25   Date for PT Re-Evaluation 03/05/15   PT Start Time 0845   PT Stop Time 0943   PT Time Calculation (min) 58 min      Past Medical History  Diagnosis Date  . ALLERGIC RHINITIS 06/02/2007  . OSTEOPENIA 06/02/2007  . VAGINITIS, ATROPHIC, POSTMENOPAUSAL 12/06/2008  . VERTIGO 02/16/2009  . Glaucoma 1-12    Past Surgical History  Procedure Laterality Date  . Abdominal hysterectomy    . Eye surgery      for glaucoma    There were no vitals filed for this visit.  Visit Diagnosis:  Right shoulder pain  Stiffness of joint, shoulder region, right      Subjective Assessment - 02/27/15 0846    Subjective Saw MD, he wants me to continue 4 more weeks, he wants me to be stronger.   Currently in Pain? Yes   Pain Score 2    Pain Location Hand   Pain Orientation Right   Pain Descriptors / Indicators Aching;Tingling   Pain Type Surgical pain   Pain Onset More than a month ago   Pain Frequency Intermittent   Aggravating Factors  night   Pain Relieving Factors lifting and reaching                         [facility] Adult PT Treatment/Exercise - 02/27/15 0001    Shoulder Exercises: Seated   Other Seated Exercises lat pull 20# 2x15, 10# biceps, 3# bent over rows and extensions   Other Seated Exercises seated row 20#, 2 way scap stabilization with 5# on pulleys   Shoulder Exercises: Standing   Other Standing Exercises pillowcase wall slide with retract/lower and 1# weight    Other Standing Exercises 1# nd 2# UE reaching various ankles   Shoulder Exercises: Pulleys   Other Pulley  Exercises Tricpes 15#   Shoulder Exercises: ROM/Strengthening   UBE (Upper Arm Bike) level 7 6 minutes   "W" Arms 2#   X to V Arms 2#   Other ROM/Strengthening Exercises isometric abduction circles in standing   Shoulder Exercises: Power [employer] trader A lot of work for shoulder abduction AROM, AAROM, eccentrics, 1#, 2# and no weight   Other Power Engineer, water 5# chest press, 5# scap stabilization with pulleys 2 ways   Cryotherapy   Number Minutes Cryotherapy 15 Minutes   Type of Cryotherapy Ice pack   Acupuncturist Location RT shld   Electrical Stimulation Parameters IFC   Electrical Stimulation Goals Pain                  PT [place] Term Goals - 12/07/14 1020    PT Tacey TERM GOAL #1   Title independent with HEP   Time 4   Period Weeks   Status Achieved           PT Long Term Goals - 02/23/15 3382    PT LONG TERM GOAL #1   Title independent with HEP   Status Partially Met   PT LONG  TERM GOAL #2   Title independent with RICE   Status Achieved   PT LONG TERM GOAL #3   Title increase ROM of the right shoulder to 130 degrees flexion   Status Achieved   PT LONG TERM GOAL #4   Title dress and do hair without difficulty   Status Partially Met               Plan - 02/27/15 0918    Clinical Impression Statement Continues with decreased strength, much better today, last week she struggled, but thinks she had over done it at home   PT Frequency 2x / week   PT Duration 4 weeks   PT Next Visit Plan Work on functional strength   Consulted and Agree with Plan of Care Patient        Problem List Patient Active Problem List   Diagnosis Date Noted  . DYSURIA 06/13/2010  . VERTIGO 02/16/2009  . VAGINITIS, ATROPHIC, POSTMENOPAUSAL 12/06/2008  . UTI 01/24/2008  . UPPER RESPIRATORY INFECTION, VIRAL 08/23/2007  . Allergic rhinitis 06/02/2007  . Disorder of bone and cartilage 06/02/2007     Sumner Boast, PT 02/27/2015, 9:25 AM  [place] [address] [address] [place], [place], [address] Phone: [phone]   Fax:  [phone]

## 2015-03-01 ENCOUNTER — Encounter: Payer: Self-pay | Admitting: Physical Therapy

## 2015-03-01 ENCOUNTER — Ambulatory Visit: Payer: Medicare Other | Admitting: Physical Therapy

## 2015-03-01 DIAGNOSIS — M25511 Pain in right shoulder: Secondary | ICD-10-CM

## 2015-03-01 DIAGNOSIS — M7541 Impingement syndrome of right shoulder: Secondary | ICD-10-CM | POA: Diagnosis not present

## 2015-03-01 DIAGNOSIS — M25611 Stiffness of right shoulder, not elsewhere classified: Secondary | ICD-10-CM

## 2015-03-01 NOTE — Therapy (Signed)
[place], [place], [address] Phone: [phone]   Fax:  [phone]  Physical Therapy Treatment  Patient Details  Name: SANYIAH KANZLER MRN: [id number] Date of Birth: 1931-10-28 Referring Provider:  Sydnee Cabal, MD  Encounter Date: 03/01/2015      PT End of Session - 03/01/15 0923    Visit Number 26   Date for PT Re-Evaluation 03/05/15   PT Start Time 6803   PT Stop Time 0941   PT Time Calculation (min) 57 min      Past Medical History  Diagnosis Date  . ALLERGIC RHINITIS 06/02/2007  . OSTEOPENIA 06/02/2007  . VAGINITIS, ATROPHIC, POSTMENOPAUSAL 12/06/2008  . VERTIGO 02/16/2009  . Glaucoma 1-12    Past Surgical History  Procedure Laterality Date  . Abdominal hysterectomy    . Eye surgery      for glaucoma    There were no vitals filed for this visit.  Visit Diagnosis:  Right shoulder pain  Stiffness of joint, shoulder region, right      Subjective Assessment - 03/01/15 0846    Subjective Some less pain in the hand.  I decreased the weight lifting I was doing at home.   Currently in Pain? Yes   Pain Score 1    Pain Location Hand   Pain Orientation Right   Pain Descriptors / Indicators Aching;Tingling   Pain Type Surgical pain            [facility] PT Assessment - 03/01/15 0001    AROM   Right Shoulder Flexion 160 Degrees   Right Shoulder ABduction 161 Degrees                     [facility] Adult PT Treatment/Exercise - 03/01/15 0001    Shoulder Exercises: Seated   Other Seated Exercises lat pull 20# 2x15, 10# biceps, 3# bent over rows and extensions   Other Seated Exercises seated row 20#, 2 way scap stabilization with 5# on pulleys   Shoulder Exercises: Standing   Other Standing Exercises pillowcase wall slide with retract/lower and 1# weight    Other Standing Exercises 1# nd 2# UE reaching various ankles   Shoulder Exercises: Pulleys   Other Pulley  Exercises Tricpes 20#   Shoulder Exercises: ROM/Strengthening   UBE (Upper Arm Bike) level 7 6 minutes   "W" Arms 2#   X to V Arms 2#   Other ROM/Strengthening Exercises ball throwing, 2# cabinet reaching   Other ROM/Strengthening Exercises isometric abduction circles in standing, overhead 8# ball carry   Shoulder Exercises: Power [employer]   Other Power [employer] A lot of work for shoulder abduction AROM, AAROM, eccentrics, 1#, 2# and no weight   Other Power Engineer, water 5# chest press, 5# scap stabilization with pulleys 2 ways   Cryotherapy   Number Minutes Cryotherapy 15 Minutes   Cryotherapy Location Shoulder   Type of Cryotherapy Ice pack   Acupuncturist Location RT shld   Electrical Stimulation Parameters IFC   Electrical Stimulation Goals Pain                  PT [place] Term Goals - 12/07/14 1020    PT Franchino TERM GOAL #1   Title independent with HEP   Time 4   Period Weeks   Status Achieved           PT Long Term Goals - 02/23/15 2122  PT LONG TERM GOAL #1   Title independent with HEP   Status Partially Met   PT LONG TERM GOAL #2   Title independent with RICE   Status Achieved   PT LONG TERM GOAL #3   Title increase ROM of the right shoulder to 130 degrees flexion   Status Achieved   PT LONG TERM GOAL #4   Title dress and do hair without difficulty   Status Partially Met               Plan - 03/01/15 0924    Clinical Impression Statement Continues to make good improvements, she is gaining strength and function.  She remains weak with functional abduction   PT Next Visit Plan Work on functional strength   Consulted and Agree with Plan of Care Patient        Problem List Patient Active Problem List   Diagnosis Date Noted  . DYSURIA 06/13/2010  . VERTIGO 02/16/2009  . VAGINITIS, ATROPHIC, POSTMENOPAUSAL 12/06/2008  . UTI 01/24/2008  . UPPER RESPIRATORY INFECTION, VIRAL 08/23/2007  . Allergic  rhinitis 06/02/2007  . Disorder of bone and cartilage 06/02/2007    Sumner Boast, PT 03/01/2015, 9:26 AM  [address] [place], [place], [address] Phone: [phone]   Fax:  [phone]

## 2015-03-06 ENCOUNTER — Encounter: Payer: Self-pay | Admitting: Physical Therapy

## 2015-03-06 ENCOUNTER — Ambulatory Visit: Payer: Medicare Other | Admitting: Physical Therapy

## 2015-03-06 DIAGNOSIS — M7541 Impingement syndrome of right shoulder: Secondary | ICD-10-CM | POA: Diagnosis not present

## 2015-03-06 DIAGNOSIS — M25511 Pain in right shoulder: Secondary | ICD-10-CM

## 2015-03-06 DIAGNOSIS — M25611 Stiffness of right shoulder, not elsewhere classified: Secondary | ICD-10-CM

## 2015-03-06 NOTE — Therapy (Signed)
[place], [place], [address] Phone: [phone]   Fax:  [phone]  Physical Therapy Treatment  Patient Details  Name: Nancy Neal MRN: [id number] Date of Birth: 22-Jul-1932 Referring Provider:  Sydnee Cabal, MD  Encounter Date: 03/06/2015      PT End of Session - 03/06/15 0852    Visit Number 56   PT Start Time 9758   PT Stop Time 0903   PT Time Calculation (min) 65 min      Past Medical History  Diagnosis Date  . ALLERGIC RHINITIS 06/02/2007  . OSTEOPENIA 06/02/2007  . VAGINITIS, ATROPHIC, POSTMENOPAUSAL 12/06/2008  . VERTIGO 02/16/2009  . Glaucoma 1-12    Past Surgical History  Procedure Laterality Date  . Abdominal hysterectomy    . Eye surgery      for glaucoma    There were no vitals filed for this visit.  Visit Diagnosis:  Right shoulder pain  Stiffness of joint, shoulder region, right      Subjective Assessment - 03/06/15 0800    Subjective Getting stronger.  Hand still bothers me.   Currently in Pain? Yes   Pain Score 1    Pain Location Shoulder   Pain Orientation Right   Pain Descriptors / Indicators Aching   Pain Type Surgical pain                         [facility] Adult PT Treatment/Exercise - 03/06/15 0001    Shoulder Exercises: Seated   Other Seated Exercises lat pull 25# 2x15, 10# biceps, 3# bent over rows and extensions   Other Seated Exercises seated row 25#, 2 way scap stabilization with 5# on pulleys   Shoulder Exercises: Standing   Internal Rotation AROM;Strengthening;20 reps   Internal Rotation Weight (lbs) 2#   ABduction AROM;Strengthening;15 reps   Shoulder ABduction Weight (lbs) no weight and 1#   ABduction Limitations needs a lot of cues to decreased shoulder elevation   Other Standing Exercises pillowcase wall slide with retract/lower and 1# weight    Other Standing Exercises 1# nd 2# UE reaching various ankles   Shoulder  Exercises: Pulleys   Other Pulley Exercises Tricpes 20#   Shoulder Exercises: ROM/Strengthening   UBE (Upper Arm Bike) level 7 6 minutes   Other ROM/Strengthening Exercises ball throwing, 2# cabinet reaching   Other ROM/Strengthening Exercises isometric abduction circles in standing, overhead 8# ball carry   Shoulder Exercises: Power Warden[employer]ranger Exercises 5# chest press, 5# scap stabilization with pulleys 2 ways   Shoulder Exercises: Body Blade   Other Body Blade Exercises 20 seconds all motions with some assist   Cryotherapy   Number Minutes Cryotherapy 15 Minutes   Cryotherapy Location Shoulder   Type of Cryotherapy Ice pack   Electrical Stimulation   Electrical Stimulation Location RT shld   Electrical Stimulation Parameters IFC   Electrical Stimulation Goals Pain                  PT Hoffer Term Goals - 12/07/14 1020    PT Heims TERM GOAL #1   Title independent with HEP   Time 4   Period Weeks   Status Achieved           PT Long Term Goals - 02/23/15 8325    PT LONG TERM GOAL #1   Title independent with HEP   Status Partially Met  PT LONG TERM GOAL #2   Title independent with RICE   Status Achieved   PT LONG TERM GOAL #3   Title increase ROM of the right shoulder to 130 degrees flexion   Status Achieved   PT LONG TERM GOAL #4   Title dress and do hair without difficulty   Status Partially Met               Plan - 03/06/15 0852    Clinical Impression Statement With fatigue she really starts to elevate the shoulder needed a mirror and cues to decrease this   PT Next Visit Plan Work on functional strength   Consulted and Agree with Plan of Care Patient        Problem List Patient Active Problem List   Diagnosis Date Noted  . DYSURIA 06/13/2010  . VERTIGO 02/16/2009  . VAGINITIS, ATROPHIC, POSTMENOPAUSAL 12/06/2008  . UTI 01/24/2008  . UPPER RESPIRATORY INFECTION, VIRAL 08/23/2007  . Allergic rhinitis 06/02/2007  .  Disorder of bone and cartilage 06/02/2007    Sumner Boast., PT 03/06/2015, 8:54 AM  [address] [place], [place], [address] Phone: [phone]   Fax:  [phone]

## 2015-03-09 ENCOUNTER — Encounter: Payer: Self-pay | Admitting: Physical Therapy

## 2015-03-09 ENCOUNTER — Ambulatory Visit: Payer: Medicare Other | Admitting: Physical Therapy

## 2015-03-09 DIAGNOSIS — M25611 Stiffness of right shoulder, not elsewhere classified: Secondary | ICD-10-CM

## 2015-03-09 DIAGNOSIS — M7541 Impingement syndrome of right shoulder: Secondary | ICD-10-CM | POA: Diagnosis not present

## 2015-03-09 DIAGNOSIS — M25511 Pain in right shoulder: Secondary | ICD-10-CM

## 2015-03-09 NOTE — Therapy (Signed)
[place], [place], [address] Phone: [phone]   Fax:  [phone]  Physical Therapy Treatment  Patient Details  Name: Nancy Neal MRN: [id number] Date of Birth: [birth year] Referring Provider:  Sydnee Cabal, MD  Encounter Date: 03/09/2015      PT End of Session - 03/09/15 0836    Visit Number 28   PT Start Time 0756   PT Stop Time 0852   PT Time Calculation (min) 56 min   Activity Tolerance Patient tolerated treatment well      Past Medical History  Diagnosis Date  . ALLERGIC RHINITIS 06/02/2007  . OSTEOPENIA 06/02/2007  . VAGINITIS, ATROPHIC, POSTMENOPAUSAL 12/06/2008  . VERTIGO 02/16/2009  . Glaucoma 1-12    Past Surgical History  Procedure Laterality Date  . Abdominal hysterectomy    . Eye surgery      for glaucoma    There were no vitals filed for this visit.  Visit Diagnosis:  Right shoulder pain  Stiffness of joint, shoulder region, right      Subjective Assessment - 03/09/15 0757    Subjective Doing better with ADL's, less issues but still some hand pain and tingling   Currently in Pain? Yes   Pain Score 1    Pain Location Shoulder   Pain Orientation Right                         [facility] Adult PT Treatment/Exercise - 03/09/15 0001    Shoulder Exercises: Supine   Other Supine Exercises Nustep level 5 right arm only push/pull   Shoulder Exercises: Seated   Other Seated Exercises lat pull 25# 2x15, 10# biceps, 4# bent over rows and extensions   Other Seated Exercises seated row 25#, 2 way scap stabilization with 5# on pulleys   Shoulder Exercises: Standing   Internal Rotation AROM;Strengthening;20 reps   Internal Rotation Weight (lbs) 2#   ABduction AROM;Strengthening;15 reps   Shoulder ABduction Weight (lbs) no weight and 1#   ABduction Limitations needs a lot of cues to decreased shoulder elevation   Other Standing Exercises pillowcase wall  slide with retract/lower and 1# weight    Other Standing Exercises 1# and 2# UE reaching various ankles   Shoulder Exercises: Pulleys   Other Pulley Exercises Tricpes 20#   Shoulder Exercises: ROM/Strengthening   UBE (Upper Arm Bike) level 7 6 minutes   Other ROM/Strengthening Exercises farmer carry 10#   Other ROM/Strengthening Exercises isometric abduction circles in standing, overhead 8# ball carry   Shoulder Exercises: Power [employer]ures trader abduction with mirror to prevent shoulder elevation, cues verbal and tactile needed   Other Power [employer] Exercises 5# chest press, 5# scap stabilization with pulleys 2 ways   Shoulder Exercises: Body Blade   Other Body Blade Exercises 20 seconds all motions with some assist   Cryotherapy   Number Minutes Cryotherapy 15 Minutes   Cryotherapy Location Shoulder   Type of Cryotherapy Ice pack   Electrical Stimulation   Electrical Stimulation Location RT shld   Electrical Stimulation Parameters IFC   Electrical Stimulation Goals Pain                PT Education - 03/09/15 0835    Education provided Yes   Education Details Started going over final HEP to eliminate some exercises and finalize some also asked her to decrease frequency to 3x/week   Person(s) Educated  Patient   Methods Explanation   Comprehension Verbalized understanding          PT Guzy Term Goals - 12/07/14 1020    PT [place] TERM GOAL #1   Title independent with HEP   Time 4   Period Weeks   Status Achieved           PT Long Term Goals - 02/23/15 0846    PT LONG TERM GOAL #1   Title independent with HEP   Status Partially Met   PT LONG TERM GOAL #2   Title independent with RICE   Status Achieved   PT LONG TERM GOAL #3   Title increase ROM of the right shoulder to 130 degrees flexion   Status Achieved   PT LONG TERM GOAL #4   Title dress and do hair without difficulty   Status Partially Met               Plan - 03/09/15  0837    Clinical Impression Statement Continues to fatigue easily.  The first 20 minutes of activity she does great, then starts to struggle especially with any over shoulder activity   PT Next Visit Plan work to finalize her home HEP   Consulted and Agree with Plan of Care Patient        Problem List Patient Active Problem List   Diagnosis Date Noted  . DYSURIA 06/13/2010  . VERTIGO 02/16/2009  . VAGINITIS, ATROPHIC, POSTMENOPAUSAL 12/06/2008  . UTI 01/24/2008  . UPPER RESPIRATORY INFECTION, VIRAL 08/23/2007  . Allergic rhinitis 06/02/2007  . Disorder of bone and cartilage 06/02/2007    Sumner Boast., PT 03/09/2015, 8:39 AM  [address] [place], [place], [address] Phone: [phone]   Fax:  [phone]

## 2015-03-12 ENCOUNTER — Ambulatory Visit: Payer: Medicare Other | Admitting: Physical Therapy

## 2015-03-12 ENCOUNTER — Encounter: Payer: Self-pay | Admitting: Physical Therapy

## 2015-03-12 DIAGNOSIS — M25611 Stiffness of right shoulder, not elsewhere classified: Secondary | ICD-10-CM

## 2015-03-12 DIAGNOSIS — M7541 Impingement syndrome of right shoulder: Secondary | ICD-10-CM | POA: Diagnosis not present

## 2015-03-12 DIAGNOSIS — M25511 Pain in right shoulder: Secondary | ICD-10-CM

## 2015-03-12 NOTE — Therapy (Signed)
[facility] [address] [place], [place], [address] Phone: [phone]   Fax:  [phone]  Physical Therapy Treatment  Patient Details  Name: Nancy Neal MRN: [id number] Date of Birth: 1932-09-27 Referring Provider:  Eugenia Mcalpine, MD  Encounter Date: 03/12/2015      PT End of Session - 03/12/15 0858    Visit Number 29   PT Start Time 0757   PT Stop Time 0900   PT Time Calculation (min) 63 min      Past Medical History  Diagnosis Date  . ALLERGIC RHINITIS 06/02/2007  . OSTEOPENIA 06/02/2007  . VAGINITIS, ATROPHIC, POSTMENOPAUSAL 12/06/2008  . VERTIGO 02/16/2009  . Glaucoma 1-12    Past Surgical History  Procedure Laterality Date  . Abdominal hysterectomy    . Eye surgery      for glaucoma    There were no vitals filed for this visit.  Visit Diagnosis:  Right shoulder pain  Stiffness of joint, shoulder region, right      Subjective Assessment - 03/12/15 0808    Subjective I feel like I am getting there, still fingers don't feel right   Currently in Pain? Yes   Pain Score 1    Pain Location Shoulder   Pain Orientation Right   Pain Descriptors / Indicators Aching                         [facility] Adult PT Treatment/Exercise - 03/12/15 0001    Shoulder Exercises: Supine   Other Supine Exercises Nustep level 5 right arm only push/pull   Shoulder Exercises: Seated   Other Seated Exercises lat pull 25# 2x15, 10# biceps, 4# bent over rows and extensions   Other Seated Exercises seated row 25#, 2 way scap stabilization with 5# on pulleys   Shoulder Exercises: Standing   ABduction AROM;Strengthening;15 reps   Shoulder ABduction Weight (lbs) no weight and 1#   Other Standing Exercises pillowcase wall slide with retract/lower and 1# weight    Other Standing Exercises 1# and 2# UE reaching various ankles   Shoulder Exercises: Pulleys   Other Pulley Exercises Tricpes 20#   Shoulder  Exercises: ROM/Strengthening   UBE (Upper Arm Bike) level 7 6 minutes   Other ROM/Strengthening Exercises isometric abduction circles in standing, overhead 8# ball carry   Shoulder Exercises: [employer] consultant abduction with mirror to prevent shoulder elevation, cues verbal and tactile needed   Other [employer] Exercises 5# chest press, 5# scap stabilization with pulleys 2 ways                PT Education - 03/12/15 0846    Education provided Yes   Education Details Went over all HEP, a safe return to the gym, how to , what to avoid, appropriate weight, reps and sets, the use of ice, body mechanics   Person(s) Educated Patient   Methods Explanation;Demonstration   Comprehension Verbalized understanding          PT Heinze Term Goals - 12/07/14 1020    PT Bowell TERM GOAL #1   Title independent with HEP   Time 4   Period Weeks   Status Achieved           PT Long Term Goals - 03/12/15 9311    PT LONG TERM GOAL #1   Title independent with HEP   Status Achieved   PT LONG TERM GOAL #  2   Title independent with RICE   Status Achieved   PT LONG TERM GOAL #3   Title increase ROM of the right shoulder to 130 degrees flexion   Status Achieved   PT LONG TERM GOAL #4   Title dress and do hair without difficulty   Status Achieved               Plan - 2015-03-23 0858    Clinical Impression Statement Patient has great ROM and function.  Limited only by some fatigue and weakness of the shoulder especially abduction   PT Next Visit Plan D/C   Consulted and Agree with Plan of Care Patient          G-Codes - 03/23/15 3244    Functional Assessment Tool Used Foto   Functional Limitation Self care   Self Care Current Status [phone]) At least 20 percent but less than 40 percent impaired, limited or restricted   Self Care Goal Status (O5366) At least 40 percent but less than 60 percent impaired, limited or restricted   Self Care Discharge Status  [phone]) At least 20 percent but less than 40 percent impaired, limited or restricted      Problem List Patient Active Problem List   Diagnosis Date Noted  . DYSURIA 06/13/2010  . VERTIGO 02/16/2009  . VAGINITIS, ATROPHIC, POSTMENOPAUSAL 12/06/2008  . UTI 01/24/2008  . UPPER RESPIRATORY INFECTION, VIRAL 08/23/2007  . Allergic rhinitis 06/02/2007  . Disorder of bone and cartilage 06/02/2007    Jearld Lesch., PT 03-23-15, 9:31 AM  [facility] [place] [facility] [address] [place], [place], [address] Phone: [phone]   Fax:  [phone]

## 2015-03-15 ENCOUNTER — Ambulatory Visit: Payer: Medicare Other | Admitting: Physical Therapy

## 2015-04-09 DIAGNOSIS — M75121 Complete rotator cuff tear or rupture of right shoulder, not specified as traumatic: Secondary | ICD-10-CM | POA: Diagnosis not present

## 2015-04-09 DIAGNOSIS — Z4789 Encounter for other orthopedic aftercare: Secondary | ICD-10-CM | POA: Diagnosis not present

## 2015-04-09 DIAGNOSIS — M792 Neuralgia and neuritis, unspecified: Secondary | ICD-10-CM | POA: Diagnosis not present

## 2015-04-09 DIAGNOSIS — M19011 Primary osteoarthritis, right shoulder: Secondary | ICD-10-CM | POA: Diagnosis not present

## 2015-04-24 ENCOUNTER — Other Ambulatory Visit: Payer: Self-pay | Admitting: Internal Medicine

## 2015-04-24 DIAGNOSIS — Z1231 Encounter for screening mammogram for malignant neoplasm of breast: Secondary | ICD-10-CM

## 2015-05-24 ENCOUNTER — Ambulatory Visit (HOSPITAL_COMMUNITY)
Admission: RE | Admit: 2015-05-24 | Discharge: 2015-05-24 | Disposition: A | Discharge status: Home / Self Care | Payer: Medicare Other | Source: Ambulatory Visit | Attending: Internal Medicine | Admitting: Internal Medicine

## 2015-05-24 DIAGNOSIS — Z1231 Encounter for screening mammogram for malignant neoplasm of breast: Secondary | ICD-10-CM | POA: Diagnosis not present

## 2015-06-12 ENCOUNTER — Encounter: Payer: Self-pay | Admitting: Internal Medicine

## 2015-06-12 ENCOUNTER — Ambulatory Visit (INDEPENDENT_AMBULATORY_CARE_PROVIDER_SITE_OTHER): Payer: Medicare Other | Admitting: Internal Medicine

## 2015-06-12 VITALS — BP 110/70 | HR 84 | Temp 99.0°F | Resp 20 | Ht 64.0 in | Wt 143.0 lb

## 2015-06-12 DIAGNOSIS — J069 Acute upper respiratory infection, unspecified: Secondary | ICD-10-CM

## 2015-06-12 MED ORDER — BENZONATATE 200 MG PO CAPS: 200.0000 mg | ORAL_CAPSULE | Freq: Two times a day (BID) | ORAL | Status: DC | PRN

## 2015-06-12 NOTE — Progress Notes (Signed)
Pre visit review using our clinic review tool, if applicable. No additional management support is needed unless otherwise documented below in the visit note. 

## 2015-06-12 NOTE — Patient Instructions (Signed)
Acute bronchitis symptoms for less than 10 days are generally not helped by antibiotics.  Take over-the-counter expectorants and cough medications such as  Mucinex DM.  Call if there is no improvement in 5 to 7 days or if  you develop worsening cough, fever, or new symptoms, such as shortness of breath or chest pain.  HOME CARE INSTRUCTIONS  Drink plenty of water. Water helps thin the mucus so your sinuses can drain more easily.  Use a humidifier.  Inhale steam 3 to 4 times a day (for example, sit in the bathroom with the shower running).  Apply a warm, moist washcloth to your face 3 to 4 times a day, or as directed by your health care provider.  Use saline nasal sprays to help moisten and clean your sinuses.  Take medicines only as directed by your health care provider.   SEEK IMMEDIATE MEDICAL CARE IF:  You have increasing pain or severe headaches.  You have nausea, vomiting, or drowsiness.  You have swelling around your face.  You have vision problems.  You have a stiff neck.  You have difficulty breathing.

## 2015-06-12 NOTE — Progress Notes (Signed)
Subjective:    Patient ID: Nancy Neal, female    DOB: April 07, 1932, 81 y.o.   MRN: [id number]  HPI  81 year old patient who has returned recently from a [place] cruise.  For the past 6 days she has had sore throat, cough, nasal congestion associated with hoarseness.  She does have a history of allergic rhinitis.  Her chief complaint is cough aggravating the sore throat  Past Medical History  Diagnosis Date  . ALLERGIC RHINITIS 06/02/2007  . OSTEOPENIA 06/02/2007  . VAGINITIS, ATROPHIC, POSTMENOPAUSAL 12/06/2008  . VERTIGO 02/16/2009  . Glaucoma 1-12    Social History   Social History  . Marital Status: Widowed    Spouse Name: N/A  . Number of Children: N/A  . Years of Education: N/A   Occupational History  . Not on file.   Social History Main Topics  . Smoking status: Never Smoker   . Smokeless tobacco: Never Used  . Alcohol Use: Yes  . Drug Use: No  . Sexual Activity: Not on file   Other Topics Concern  . Not on file   Social History Narrative    Past Surgical History  Procedure Laterality Date  . Abdominal hysterectomy    . Eye surgery      for glaucoma    Family History  Problem Relation Age of Onset  . Suicidality Mother   . Heart disease Father   . Arthritis Brother     Allergies  Allergen Reactions  . Codeine Sulfate   . Meclizine Other (See Comments)    glaucoma    Current Outpatient Prescriptions on File Prior to Visit  Medication Sig Dispense Refill  . calcium carbonate (CALCIUM 500) 1250 MG tablet Take 1 tablet by mouth daily.      . carboxymethylcellulose (REFRESH PLUS) 0.5 % SOLN 1 drop 3 (three) times daily as needed.    . fexofenadine (ALLEGRA) 60 MG tablet Take 60 mg by mouth daily.      . GuaiFENesin (MUCUS RELIEF ADULT PO) Take 1 tablet by mouth as needed.    Marland Kitchen ibuprofen (ADVIL,MOTRIN) 600 MG tablet     . multivitamin (THERAGRAN) per tablet Take 1 tablet by mouth daily.      Marland Kitchen omeprazole (PRILOSEC OTC) 20 MG tablet Take 20 mg by  mouth daily.    . Probiotic Product (PROBIOTIC FORMULA PO) Take by mouth.    . Sodium Chloride-Sodium Bicarb (NASAMIST ISOTONIC NA) Place into the nose.     No current facility-administered medications on file prior to visit.    BP 110/70 mmHg  Pulse 84  Temp(Src) 99 F (37.2 C) (Oral)  Resp 20  Ht  (1.626 m)  Wt 143 lb (64.864 kg)  BMI 24.53 kg/m2  SpO2 98%     Review of Systems  Constitutional: Positive for activity change, appetite change and fatigue.  HENT: Positive for congestion, rhinorrhea, sinus pressure, sore throat and voice change. Negative for dental problem, hearing loss and tinnitus.   Eyes: Negative for pain, discharge and visual disturbance.  Respiratory: Positive for cough. Negative for shortness of breath.   Cardiovascular: Negative for chest pain, palpitations and leg swelling.  Gastrointestinal: Negative for nausea, vomiting, abdominal pain, diarrhea, constipation, blood in stool and abdominal distention.  Genitourinary: Negative for dysuria, urgency, frequency, hematuria, flank pain, vaginal bleeding, vaginal discharge, difficulty urinating, vaginal pain and pelvic pain.  Musculoskeletal: Negative for joint swelling, arthralgias and gait problem.  Skin: Negative for rash.  Neurological: Negative for dizziness, syncope,  speech difficulty, weakness, numbness and headaches.  Hematological: Negative for adenopathy.  Psychiatric/Behavioral: Negative for behavioral problems, dysphoric mood and agitation. The patient is not nervous/anxious.        Objective:   Physical Exam  Constitutional: She is oriented to person, place, and time. She appears well-developed and well-nourished.  Moderate hoarseness Temperature 99 degrees  HENT:  Head: Normocephalic.  Right Ear: External ear normal.  Left Ear: External ear normal.  Mouth/Throat: Oropharynx is clear and moist.  Throat appeared normal  Eyes: Conjunctivae and EOM are normal. Pupils are equal, round, and  reactive to light.  Neck: Normal range of motion. Neck supple. No thyromegaly present.  Cardiovascular: Normal rate, regular rhythm, normal heart sounds and intact distal pulses.   Pulmonary/Chest: Effort normal and breath sounds normal. No respiratory distress. She has no wheezes. She has no rales.  Abdominal: Soft. Bowel sounds are normal. She exhibits no mass. There is no tenderness.  Musculoskeletal: Normal range of motion.  Lymphadenopathy:    She has no cervical adenopathy.  Neurological: She is alert and oriented to person, place, and time.  Skin: Skin is warm and dry. No rash noted.  Psychiatric: She has a normal mood and affect. Her behavior is normal.          Assessment & Plan:   Viral URI with cough Codeine sensitivity  Will treat with Mucinex DM and Tessalon Continue ibuprofen

## 2015-06-22 ENCOUNTER — Telehealth: Payer: Self-pay | Admitting: Internal Medicine

## 2015-06-22 ENCOUNTER — Encounter: Payer: Self-pay | Admitting: Internal Medicine

## 2015-06-22 ENCOUNTER — Ambulatory Visit (INDEPENDENT_AMBULATORY_CARE_PROVIDER_SITE_OTHER): Payer: Medicare Other | Admitting: Internal Medicine

## 2015-06-22 VITALS — BP 132/80 | Temp 98.2°F | Ht 64.0 in | Wt 137.9 lb

## 2015-06-22 DIAGNOSIS — M75121 Complete rotator cuff tear or rupture of right shoulder, not specified as traumatic: Secondary | ICD-10-CM | POA: Diagnosis not present

## 2015-06-22 DIAGNOSIS — M19011 Primary osteoarthritis, right shoulder: Secondary | ICD-10-CM | POA: Diagnosis not present

## 2015-06-22 DIAGNOSIS — J040 Acute laryngitis: Secondary | ICD-10-CM | POA: Diagnosis not present

## 2015-06-22 DIAGNOSIS — J029 Acute pharyngitis, unspecified: Secondary | ICD-10-CM

## 2015-06-22 DIAGNOSIS — K219 Gastro-esophageal reflux disease without esophagitis: Secondary | ICD-10-CM

## 2015-06-22 DIAGNOSIS — M792 Neuralgia and neuritis, unspecified: Secondary | ICD-10-CM | POA: Diagnosis not present

## 2015-06-22 DIAGNOSIS — Z4789 Encounter for other orthopedic aftercare: Secondary | ICD-10-CM | POA: Diagnosis not present

## 2015-06-22 NOTE — Telephone Encounter (Signed)
Patient Name: Nancy Neal DOB: [birth year] Initial Comment Caller states, saw the Dr a week ago for cold and sore throat, the cough went away, but the sore throat is still bothering her Nurse Assessment Nurse: Yetta Barre, RN, Miranda Date/Time (Eastern Time): 06/22/2015 9:39:29 AM Confirm and document reason for call. If symptomatic, describe symptoms. ---Caller states she has had a sore throat for 3 weeks. Seen last week for a cold but the cold symptoms have resolved. Has the patient traveled out of the country within the last 30 days? ---Yes Where have you traveled? ([place] for Ebola and Ebola guideline, [place], [place] for MERS) ---[place] Does the patient require triage? ---Yes Related visit to physician within the last 2 weeks? ---Yes Does the PT have any chronic conditions? (i.e. diabetes, asthma, etc.) ---No Guidelines Guideline Title Affirmed Question Affirmed Notes Sore Throat [1] Sore throat with cough/cold symptoms AND [2] present > 5 days Final Disposition User See PCP When Office is Open (within 3 days) Yetta Barre, RN, Miranda Comments Appt scheduled for today at 11:15am with Dr. Fabian Sharp Disagree/Comply: Comply

## 2015-06-22 NOTE — Progress Notes (Signed)
Pre visit review using our clinic review tool, if applicable. No additional management support is needed unless otherwise documented below in the visit note.  Chief Complaint  Patient presents with  . Sore Throat    HPI: Patient Nancy Neal  comes in today for SDA for  Ongoing  problem evaluation.  pcp na .Had uri  Last week and then evolved saw pcp fele self limited  Last week .   Cough now gone and sore throat stays on  Hurts all the time and affecting her voice .   And laryngitis  Worse at time.  Very bad cough  And that  Irritated  Throat   No fever .  No wheezing  Not as bad swallowing not progressing    . Girlfriend   in travel   Had same illness... Has nocturnal reflux and has been o ff med for a bit during this illness.  ROS: See pertinent positives and negatives per HPI. No fever sweats chills  Hemoptysis was using throat losenges  No droolinig   Past Medical History  Diagnosis Date  . ALLERGIC RHINITIS 06/02/2007  . OSTEOPENIA 06/02/2007  . VAGINITIS, ATROPHIC, POSTMENOPAUSAL 12/06/2008  . VERTIGO 02/16/2009  . Glaucoma 1-12    Family History  Problem Relation Age of Onset  . Suicidality Mother   . Heart disease Father   . Arthritis Brother     Social History   Social History  . Marital Status: Widowed    Spouse Name: N/A  . Number of Children: N/A  . Years of Education: N/A   Social History Main Topics  . Smoking status: Never Smoker   . Smokeless tobacco: Never Used  . Alcohol Use: Yes  . Drug Use: No  . Sexual Activity: Not Asked   Other Topics Concern  . None   Social History Narrative    Outpatient Prescriptions Prior to Visit  Medication Sig Dispense Refill  . calcium carbonate (CALCIUM 500) 1250 MG tablet Take 1 tablet by mouth daily.      . carboxymethylcellulose (REFRESH PLUS) 0.5 % SOLN 1 drop 3 (three) times daily as needed.    . fexofenadine (ALLEGRA) 60 MG tablet Take 60 mg by mouth daily.      . GuaiFENesin (MUCUS RELIEF ADULT PO) Take 1  tablet by mouth as needed.    Marland Kitchen ibuprofen (ADVIL,MOTRIN) 600 MG tablet     . multivitamin (THERAGRAN) per tablet Take 1 tablet by mouth daily.      Marland Kitchen omeprazole (PRILOSEC OTC) 20 MG tablet Take 20 mg by mouth daily.    . Probiotic Product (PROBIOTIC FORMULA PO) Take by mouth.    . benzonatate (TESSALON) 200 MG capsule Take 1 capsule (200 mg total) by mouth 2 (two) times daily as needed for cough. 20 capsule 0  . Sodium Chloride-Sodium Bicarb (NASAMIST ISOTONIC NA) Place into the nose.     No facility-administered medications prior to visit.     EXAM:  BP 132/80 mmHg  Temp(Src) 98.2 F (36.8 C) (Oral)  Ht  (1.626 m)  Wt 137 lb 14.4 oz (62.551 kg)  BMI 23.66 kg/m2  Body mass index is 23.66 kg/(m^2).  GENERAL: vitals reviewed and listed above, alert, oriented, appears well hydrated and in no acute distress mild hoarseness no stridor nl speech otherwise HEENT: atraumatic, conjunctiva  clear, no obvious abnormalities on inspection of external nose and ears tmx nl lm OP : no lesion edema or exudate  No pnd seen face non  tender  NECK: no obvious masses on inspection palpation  LUNGS: clear to auscultation bilaterally, no wheezes, rales or rhonchi, good air movement CV: HRRR, no clubbing cyanosis or  peripheral edema nl cap refill  MS: moves all extremities without noticeable focal  abnormality PSYCH: pleasant and cooperative, no obvious depression or anxiety  ASSESSMENT AND PLAN:  Discussed the following assessment and plan:  Sore throat  Acute laryngitis - residula from uri and cough  no alarm sx otherwise   Gastroesophageal reflux disease, esophagitis presence not specified - nocturnal has been off prilosec rec  disc restart   Expectant management.  No alarm features on exam or hx  At this time can consider steroid but thinks risk more than benefit  -Patient advised to return or notify health care team  if symptoms worsen ,persist or new concerns arise.  Patient  Instructions  i think this is related  To  The viral respiratory infection and you have a painful laryngx  Avoid respiratory irritants . Voice rest for 3 days  And hot   liquids  .      Gargles   .   dont use   Throat   Try not to cough   Throat clearing .   tylenol or advil for pain .   Should improve in the next 5 -7 days  . If get fever   .  Can add the prilosec  Once a day .   30 - 60 minutes pre meal . To get best effect.    Also      Laryngitis At the top of your windpipe is your voice box. It is the source of your voice. Inside your voice box are 2 bands of muscles called vocal cords. When you breathe, your vocal cords are relaxed and open so that air can get into the lungs. When you decide to say something, these cords come together and vibrate. The sound from these vibrations goes into your throat and comes out through your mouth as sound. Laryngitis is an inflammation of the vocal cords that causes hoarseness, cough, loss of voice, sore throat, and dry throat. Laryngitis can be temporary (acute) or long-term (chronic). Most cases of acute laryngitis improve with time.Chronic laryngitis lasts for more than 3 weeks. CAUSES Laryngitis can often be related to excessive smoking, talking, or yelling, as well as inhalation of toxic fumes and allergies. Acute laryngitis is usually caused by a viral infection, vocal strain, measles or mumps, or bacterial infections. Chronic laryngitis is usually caused by vocal cord strain, vocal cord injury, postnasal drip, growths on the vocal cords, or acid reflux. SYMPTOMS   Cough.  Sore throat.  Dry throat. RISK FACTORS  Respiratory infections.  Exposure to irritating substances, such as cigarette smoke, excessive amounts of alcohol, stomach acids, and workplace chemicals.  Voice trauma, such as vocal cord injury from shouting or speaking too loud. DIAGNOSIS  Your cargiver will perform a physical exam. During the physical exam, your caregiver  will examine your throat. The most common sign of laryngitis is hoarseness. Laryngoscopy may be necessary to confirm the diagnosis of this condition. This procedure allows your caregiver to look into the larynx. HOME CARE INSTRUCTIONS  Drink enough fluids to keep your urine clear or pale yellow.  Rest until you no longer have symptoms or as directed by your caregiver.  Breathe in moist air.  Take all medicine as directed by your caregiver.  Do not smoke.  Talk as little as possible (  this includes whispering).  Write on paper instead of talking until your voice is back to normal.  Follow up with your caregiver if your condition has not improved after 10 days. SEEK MEDICAL CARE IF:   You have trouble breathing.  You cough up blood.  You have persistent fever.  You have increasing pain.  You have difficulty swallowing. MAKE SURE YOU:  Understand these instructions.  Will watch your condition.  Will get help right away if you are not doing well or get worse. Document Released: 10/06/2005 Document Revised: 12/29/2011 Document Reviewed: 12/12/2010 [facility] Patient Information 2015 [place], [place]. This information is not intended to replace advice given to you by your health care provider. Make sure you discuss any questions you have with your health care provider.      Neta Mends. Panosh M.D.

## 2015-06-22 NOTE — Patient Instructions (Signed)
i think this is related  To  The viral respiratory infection and you have a painful laryngx  Avoid respiratory irritants . Voice rest for 3 days  And hot   liquids  .      Gargles   .   dont use   Throat   Try not to cough   Throat clearing .   tylenol or advil for pain .   Should improve in the next 5 -7 days  . If get fever   .  Can add the prilosec  Once a day .   30 - 60 minutes pre meal . To get best effect.    Also      Laryngitis At the top of your windpipe is your voice box. It is the source of your voice. Inside your voice box are 2 bands of muscles called vocal cords. When you breathe, your vocal cords are relaxed and open so that air can get into the lungs. When you decide to say something, these cords come together and vibrate. The sound from these vibrations goes into your throat and comes out through your mouth as sound. Laryngitis is an inflammation of the vocal cords that causes hoarseness, cough, loss of voice, sore throat, and dry throat. Laryngitis can be temporary (acute) or long-term (chronic). Most cases of acute laryngitis improve with time.Chronic laryngitis lasts for more than 3 weeks. CAUSES Laryngitis can often be related to excessive smoking, talking, or yelling, as well as inhalation of toxic fumes and allergies. Acute laryngitis is usually caused by a viral infection, vocal strain, measles or mumps, or bacterial infections. Chronic laryngitis is usually caused by vocal cord strain, vocal cord injury, postnasal drip, growths on the vocal cords, or acid reflux. SYMPTOMS   Cough.  Sore throat.  Dry throat. RISK FACTORS  Respiratory infections.  Exposure to irritating substances, such as cigarette smoke, excessive amounts of alcohol, stomach acids, and workplace chemicals.  Voice trauma, such as vocal cord injury from shouting or speaking too loud. DIAGNOSIS  Your cargiver will perform a physical exam. During the physical exam, your caregiver will examine  your throat. The most common sign of laryngitis is hoarseness. Laryngoscopy may be necessary to confirm the diagnosis of this condition. This procedure allows your caregiver to look into the larynx. HOME CARE INSTRUCTIONS  Drink enough fluids to keep your urine clear or pale yellow.  Rest until you no longer have symptoms or as directed by your caregiver.  Breathe in moist air.  Take all medicine as directed by your caregiver.  Do not smoke.  Talk as little as possible (this includes whispering).  Write on paper instead of talking until your voice is back to normal.  Follow up with your caregiver if your condition has not improved after 10 days. SEEK MEDICAL CARE IF:   You have trouble breathing.  You cough up blood.  You have persistent fever.  You have increasing pain.  You have difficulty swallowing. MAKE SURE YOU:  Understand these instructions.  Will watch your condition.  Will get help right away if you are not doing well or get worse. Document Released: 10/06/2005 Document Revised: 12/29/2011 Document Reviewed: 12/12/2010 [facility] Patient Information 2015 [place], [place]. This information is not intended to replace advice given to you by your health care provider. Make sure you discuss any questions you have with your health care provider.

## 2015-07-02 ENCOUNTER — Ambulatory Visit (INDEPENDENT_AMBULATORY_CARE_PROVIDER_SITE_OTHER): Payer: Medicare Other | Admitting: Internal Medicine

## 2015-07-02 ENCOUNTER — Encounter: Payer: Self-pay | Admitting: Internal Medicine

## 2015-07-02 VITALS — BP 130/70 | HR 79 | Temp 98.3°F | Ht 64.0 in | Wt 134.0 lb

## 2015-07-02 DIAGNOSIS — J069 Acute upper respiratory infection, unspecified: Secondary | ICD-10-CM

## 2015-07-02 DIAGNOSIS — B9789 Other viral agents as the cause of diseases classified elsewhere: Secondary | ICD-10-CM

## 2015-07-02 DIAGNOSIS — J302 Other seasonal allergic rhinitis: Secondary | ICD-10-CM | POA: Diagnosis not present

## 2015-07-02 MED ORDER — PREDNISONE 20 MG PO TABS: 20.0000 mg | ORAL_TABLET | Freq: Two times a day (BID) | ORAL | Status: DC

## 2015-07-02 NOTE — Patient Instructions (Signed)
Acute bronchitis symptoms  are generally not helped by antibiotics.  Take over-the-counter expectorants and cough medications such as  Mucinex DM.  Call if there is no improvement in 5 to 7 days or if  you develop worsening cough, fever, or new symptoms, such as shortness of breath or chest pain. 

## 2015-07-02 NOTE — Progress Notes (Signed)
Subjective:    Patient ID: Nancy Neal, female    DOB: May 08, 1932, 81 y.o.   MRN: [id number]  HPI  Wt Readings from Last 3 Encounters:  07/02/15 134 lb (60.782 kg)  06/22/15 137 lb 14.4 oz (62.551 kg)  06/12/15 143 lb (64.53 kg)   82 year old patient who has a history of allergic rhinitis.  She has returned recently from a trip to [place] where her roommate.  The suffered from a URI.  Symptoms have included some hoarseness which has larger resolved.  She has some persistent sinus congestion and cough.  She has a history reflux and does take PPI therapy.  No real fever.  Past Medical History  Diagnosis Date  . ALLERGIC RHINITIS 06/02/2007  . OSTEOPENIA 06/02/2007  . VAGINITIS, ATROPHIC, POSTMENOPAUSAL 12/06/2008  . VERTIGO 02/16/2009  . Glaucoma 1-12    Social History   Social History  . Marital Status: Widowed    Spouse Name: N/A  . Number of Children: N/A  . Years of Education: N/A   Occupational History  . Not on file.   Social History Main Topics  . Smoking status: Never Smoker   . Smokeless tobacco: Never Used  . Alcohol Use: Yes  . Drug Use: No  . Sexual Activity: Not on file   Other Topics Concern  . Not on file   Social History Narrative    Past Surgical History  Procedure Laterality Date  . Abdominal hysterectomy    . Eye surgery      for glaucoma    Family History  Problem Relation Age of Onset  . Suicidality Mother   . Heart disease Father   . Arthritis Brother     Allergies  Allergen Reactions  . Codeine Sulfate   . Meclizine Other (See Comments)    glaucoma    Current Outpatient Prescriptions on File Prior to Visit  Medication Sig Dispense Refill  . calcium carbonate (CALCIUM 500) 1250 MG tablet Take 1 tablet by mouth daily.      . carboxymethylcellulose (REFRESH PLUS) 0.5 % SOLN 1 drop 3 (three) times daily as needed.    . fexofenadine (ALLEGRA) 60 MG tablet Take 60 mg by mouth daily.      . GuaiFENesin (MUCUS RELIEF ADULT PO) Take  1 tablet by mouth as needed.    Marland Kitchen ibuprofen (ADVIL,MOTRIN) 600 MG tablet     . multivitamin (THERAGRAN) per tablet Take 1 tablet by mouth daily.      Marland Kitchen omeprazole (PRILOSEC OTC) 20 MG tablet Take 20 mg by mouth daily.    . Probiotic Product (PROBIOTIC FORMULA PO) Take by mouth.     No current facility-administered medications on file prior to visit.    BP 130/70 mmHg  Pulse 79  Temp(Src) 98.3 F (36.8 C) (Oral)  Ht 5\' 4"  (1.626 m)  Wt 134 lb (60.782 kg)  BMI 22.99 kg/m2  SpO2 98%    Review of Systems  Constitutional: Positive for fatigue.  HENT: Positive for congestion, sinus pressure, sore throat and voice change. Negative for dental problem, hearing loss, rhinorrhea and tinnitus.   Eyes: Negative for pain, discharge and visual disturbance.  Respiratory: Positive for cough. Negative for shortness of breath.   Cardiovascular: Negative for chest pain, palpitations and leg swelling.  Gastrointestinal: Negative for nausea, vomiting, abdominal pain, diarrhea, constipation, blood in stool and abdominal distention.  Genitourinary: Negative for dysuria, urgency, frequency, hematuria, flank pain, vaginal bleeding, vaginal discharge, difficulty urinating, vaginal pain and pelvic pain.  Musculoskeletal: Negative for joint swelling, arthralgias and gait problem.  Skin: Negative for rash.  Neurological: Negative for dizziness, syncope, speech difficulty, weakness, numbness and headaches.  Hematological: Negative for adenopathy.  Psychiatric/Behavioral: Negative for behavioral problems, dysphoric mood and agitation. The patient is not nervous/anxious.        Objective:   Physical Exam  Constitutional: She is oriented to person, place, and time. She appears well-developed and well-nourished.  HENT:  Head: Normocephalic.  Right Ear: External ear normal.  Left Ear: External ear normal.  Mouth/Throat: Oropharynx is clear and moist.  Eyes: Conjunctivae and EOM are normal. Pupils are equal,  round, and reactive to light.  Neck: Normal range of motion. Neck supple. No thyromegaly present.  Cardiovascular: Normal rate, regular rhythm, normal heart sounds and intact distal pulses.   Pulmonary/Chest: Effort normal and breath sounds normal.  Abdominal: Soft. Bowel sounds are normal. She exhibits no mass. There is no tenderness.  Musculoskeletal: Normal range of motion.  Lymphadenopathy:    She has no cervical adenopathy.  Neurological: She is alert and oriented to person, place, and time.  Skin: Skin is warm and dry. No rash noted.  Psychiatric: She has a normal mood and affect. Her behavior is normal.          Assessment & Plan:   Viral URI with cough Pharyngitis Laryngitis resolved History allergic rhinitis  Will treat with oral prednisone 20 mg twice daily for 6 days Resume Allegra Continue PPI therapy

## 2015-07-02 NOTE — Progress Notes (Signed)
Pre visit review using our clinic review tool, if applicable. No additional management support is needed unless otherwise documented below in the visit note. 

## 2015-08-01 DIAGNOSIS — H401134 Primary open-angle glaucoma, bilateral, indeterminate stage: Secondary | ICD-10-CM | POA: Diagnosis not present

## 2015-08-17 DIAGNOSIS — Z23 Encounter for immunization: Secondary | ICD-10-CM | POA: Diagnosis not present

## 2015-09-26 DIAGNOSIS — M25562 Pain in left knee: Secondary | ICD-10-CM | POA: Diagnosis not present

## 2015-09-26 DIAGNOSIS — M1712 Unilateral primary osteoarthritis, left knee: Secondary | ICD-10-CM | POA: Diagnosis not present

## 2015-10-24 DIAGNOSIS — M25562 Pain in left knee: Secondary | ICD-10-CM | POA: Diagnosis not present

## 2015-10-24 DIAGNOSIS — M1712 Unilateral primary osteoarthritis, left knee: Secondary | ICD-10-CM | POA: Diagnosis not present

## 2015-11-21 DIAGNOSIS — I2510 Atherosclerotic heart disease of native coronary artery without angina pectoris: Secondary | ICD-10-CM

## 2015-11-21 DIAGNOSIS — I219 Acute myocardial infarction, unspecified: Secondary | ICD-10-CM

## 2015-11-21 HISTORY — DX: Acute myocardial infarction, unspecified: I21.9

## 2015-11-21 HISTORY — DX: Atherosclerotic heart disease of native coronary artery without angina pectoris: I25.10

## 2015-12-15 ENCOUNTER — Encounter (HOSPITAL_COMMUNITY): Payer: Self-pay | Admitting: *Deleted

## 2015-12-15 ENCOUNTER — Telehealth: Payer: Self-pay | Admitting: Internal Medicine

## 2015-12-15 ENCOUNTER — Inpatient Hospital Stay (HOSPITAL_COMMUNITY)
Admission: EM | Admit: 2015-12-15 | Discharge: 2015-12-17 | DRG: 247 | Disposition: A | Discharge status: Home / Self Care | Payer: Medicare Other | Attending: Interventional Cardiology | Admitting: Interventional Cardiology

## 2015-12-15 ENCOUNTER — Encounter (HOSPITAL_COMMUNITY)
Admission: EM | Disposition: A | Discharge status: Home / Self Care | Payer: Self-pay | Source: Home / Self Care | Attending: Interventional Cardiology

## 2015-12-15 DIAGNOSIS — I2111 ST elevation (STEMI) myocardial infarction involving right coronary artery: Secondary | ICD-10-CM | POA: Diagnosis not present

## 2015-12-15 DIAGNOSIS — I25119 Atherosclerotic heart disease of native coronary artery with unspecified angina pectoris: Secondary | ICD-10-CM | POA: Diagnosis present

## 2015-12-15 DIAGNOSIS — H409 Unspecified glaucoma: Secondary | ICD-10-CM | POA: Diagnosis present

## 2015-12-15 DIAGNOSIS — I700 Atherosclerosis of aorta: Secondary | ICD-10-CM | POA: Diagnosis not present

## 2015-12-15 DIAGNOSIS — I2510 Atherosclerotic heart disease of native coronary artery without angina pectoris: Secondary | ICD-10-CM | POA: Diagnosis not present

## 2015-12-15 DIAGNOSIS — R1013 Epigastric pain: Secondary | ICD-10-CM | POA: Diagnosis not present

## 2015-12-15 DIAGNOSIS — J449 Chronic obstructive pulmonary disease, unspecified: Secondary | ICD-10-CM | POA: Diagnosis not present

## 2015-12-15 DIAGNOSIS — I241 Dressler's syndrome: Secondary | ICD-10-CM

## 2015-12-15 DIAGNOSIS — M8580 Other specified disorders of bone density and structure, unspecified site: Secondary | ICD-10-CM | POA: Diagnosis present

## 2015-12-15 DIAGNOSIS — I2119 ST elevation (STEMI) myocardial infarction involving other coronary artery of inferior wall: Principal | ICD-10-CM | POA: Diagnosis present

## 2015-12-15 DIAGNOSIS — E785 Hyperlipidemia, unspecified: Secondary | ICD-10-CM | POA: Diagnosis not present

## 2015-12-15 DIAGNOSIS — I25110 Atherosclerotic heart disease of native coronary artery with unstable angina pectoris: Secondary | ICD-10-CM | POA: Diagnosis not present

## 2015-12-15 DIAGNOSIS — Z9861 Coronary angioplasty status: Secondary | ICD-10-CM

## 2015-12-15 HISTORY — PX: CARDIAC CATHETERIZATION: SHX172

## 2015-12-15 LAB — COMPREHENSIVE METABOLIC PANEL
ALBUMIN: 4.1 g/dL (ref 3.5–5.0)
ALK PHOS: 64 U/L (ref 38–126)
ALT: 19 U/L (ref 14–54)
ANION GAP: 10 (ref 5–15)
AST: 36 U/L (ref 15–41)
BUN: 18 mg/dL (ref 6–20)
CO2: 24 mmol/L (ref 22–32)
Calcium: 9.6 mg/dL (ref 8.9–10.3)
Chloride: 104 mmol/L (ref 101–111)
Creatinine, Ser: 0.85 mg/dL (ref 0.44–1.00)
GFR calc Af Amer: >60 mL/min (ref >60)
GFR calc non Af Amer: >60 mL/min (ref >60)
GLUCOSE: 158 mg/dL — AB (ref 65–99)
POTASSIUM: 4.1 mmol/L (ref 3.5–5.1)
SODIUM: 138 mmol/L (ref 135–145)
Total Bilirubin: 1.4 mg/dL — ABNORMAL HIGH (ref 0.3–1.2)
Total Protein: 6.4 g/dL — ABNORMAL LOW (ref 6.5–8.1)

## 2015-12-15 LAB — CBC
HEMATOCRIT: 39.8 % (ref 36.0–46.0)
HEMOGLOBIN: 13.7 g/dL (ref 12.0–15.0)
MCH: 32.2 pg (ref 26.0–34.0)
MCHC: 34.4 g/dL (ref 30.0–36.0)
MCV: 93.4 fL (ref 78.0–100.0)
Platelets: 275 10*3/uL (ref 150–400)
RBC: 4.26 MIL/uL (ref 3.87–5.11)
RDW: 13.5 % (ref 11.5–15.5)
WBC: 7.3 10*3/uL (ref 4.0–10.5)

## 2015-12-15 LAB — URINALYSIS, ROUTINE W REFLEX MICROSCOPIC
BILIRUBIN URINE: NEGATIVE
Glucose, UA: NEGATIVE mg/dL
Ketones, ur: 15 mg/dL — AB
Leukocytes, UA: NEGATIVE
NITRITE: NEGATIVE
PROTEIN: NEGATIVE mg/dL
Specific Gravity, Urine: 1.01 (ref 1.005–1.030)
pH: 6 (ref 5.0–8.0)

## 2015-12-15 LAB — LIPID PANEL
CHOL/HDL RATIO: 2.7 ratio
Cholesterol: 167 mg/dL (ref 0–200)
HDL: 61 mg/dL (ref >40)
LDL CALC: 84 mg/dL (ref 0–99)
TRIGLYCERIDES: 112 mg/dL (ref <150)
VLDL: 22 mg/dL (ref 0–40)

## 2015-12-15 LAB — TROPONIN I
Troponin I: 19.57 ng/mL (ref <0.031)
Troponin I: 15.68 ng/mL (ref <0.031)

## 2015-12-15 LAB — TSH: TSH: 0.466 u[IU]/mL (ref 0.350–4.500)

## 2015-12-15 LAB — LIPASE, BLOOD: LIPASE: 25 U/L (ref 11–51)

## 2015-12-15 LAB — I-STAT TROPONIN, ED: TROPONIN I, POC: 0.89 ng/mL — AB (ref 0.00–0.08)

## 2015-12-15 LAB — URINE MICROSCOPIC-ADD ON: BACTERIA UA: NONE SEEN

## 2015-12-15 LAB — MRSA PCR SCREENING: MRSA by PCR: NEGATIVE

## 2015-12-15 LAB — BRAIN NATRIURETIC PEPTIDE: B Natriuretic Peptide: 137.8 pg/mL — ABNORMAL HIGH (ref 0.0–100.0)

## 2015-12-15 SURGERY — LEFT HEART CATH AND CORONARY ANGIOGRAPHY
Anesthesia: Moderate Sedation

## 2015-12-15 MED ORDER — MORPHINE SULFATE (PF) 4 MG/ML IV SOLN
INTRAVENOUS | Status: AC
  Administered 2015-12-15: 2 mg
  Filled 2015-12-15: qty 1

## 2015-12-15 MED ORDER — TIROFIBAN HCL IN NACL 5-0.9 MG/100ML-% IV SOLN
INTRAVENOUS | Status: DC | PRN
  Administered 2015-12-15: 0.15 ug/kg/min via INTRAVENOUS

## 2015-12-15 MED ORDER — TICAGRELOR 90 MG PO TABS
ORAL_TABLET | ORAL | Status: AC
  Filled 2015-12-15: qty 1

## 2015-12-15 MED ORDER — VERAPAMIL HCL 2.5 MG/ML IV SOLN
INTRAVENOUS | Status: DC | PRN
  Administered 2015-12-15: 13:00:00 via INTRA_ARTERIAL

## 2015-12-15 MED ORDER — NITROGLYCERIN 0.4 MG SL SUBL
SUBLINGUAL_TABLET | SUBLINGUAL | Status: AC
  Administered 2015-12-15: 0.4 mg
  Filled 2015-12-15: qty 1

## 2015-12-15 MED ORDER — MIDAZOLAM HCL 2 MG/2ML IJ SOLN
INTRAMUSCULAR | Status: DC | PRN
  Administered 2015-12-15: 1 mg via INTRAVENOUS

## 2015-12-15 MED ORDER — IOHEXOL 350 MG/ML SOLN
INTRAVENOUS | Status: DC | PRN
  Administered 2015-12-15: 235 mL via INTRAVENOUS

## 2015-12-15 MED ORDER — TIROFIBAN HCL IN NACL 5-0.9 MG/100ML-% IV SOLN
0.1500 ug/kg/min | INTRAVENOUS | Status: AC
  Administered 2015-12-15: 0.15 ug/kg/min via INTRAVENOUS
  Filled 2015-12-15: qty 100

## 2015-12-15 MED ORDER — TIROFIBAN HCL IN NACL 5-0.9 MG/100ML-% IV SOLN
INTRAVENOUS | Status: AC
  Filled 2015-12-15: qty 100

## 2015-12-15 MED ORDER — BIVALIRUDIN BOLUS VIA INFUSION - CUPID
INTRAVENOUS | Status: DC | PRN
Start: 2015-12-15 — End: 2015-12-15
  Administered 2015-12-15: 45.9 mg via INTRAVENOUS

## 2015-12-15 MED ORDER — MIDAZOLAM HCL 2 MG/2ML IJ SOLN
INTRAMUSCULAR | Status: AC
  Filled 2015-12-15: qty 2

## 2015-12-15 MED ORDER — ONDANSETRON HCL 4 MG/2ML IJ SOLN: 4.0000 mg | Freq: Four times a day (QID) | INTRAMUSCULAR | Status: DC | PRN

## 2015-12-15 MED ORDER — ONDANSETRON 4 MG PO TBDP
ORAL_TABLET | ORAL | Status: AC
  Filled 2015-12-15: qty 1

## 2015-12-15 MED ORDER — ACETAMINOPHEN 325 MG PO TABS: 650.0000 mg | ORAL_TABLET | ORAL | Status: DC | PRN

## 2015-12-15 MED ORDER — ASPIRIN 81 MG PO CHEW
81.0000 mg | CHEWABLE_TABLET | Freq: Every day | ORAL | Status: DC
  Administered 2015-12-16 – 2015-12-17 (×2): 81 mg via ORAL
  Filled 2015-12-15 (×2): qty 1

## 2015-12-15 MED ORDER — VERAPAMIL HCL 2.5 MG/ML IV SOLN
INTRAVENOUS | Status: AC
  Filled 2015-12-15: qty 2

## 2015-12-15 MED ORDER — SODIUM CHLORIDE 0.9 % WEIGHT BASED INFUSION: 3.0000 mL/kg/h | INTRAVENOUS | Status: AC

## 2015-12-15 MED ORDER — TICAGRELOR 90 MG PO TABS
ORAL_TABLET | ORAL | Status: DC | PRN
  Administered 2015-12-15: 180 mg via ORAL

## 2015-12-15 MED ORDER — PANTOPRAZOLE SODIUM 20 MG PO TBEC
20.0000 mg | DELAYED_RELEASE_TABLET | Freq: Every day | ORAL | Status: DC
  Administered 2015-12-15 – 2015-12-17 (×3): 20 mg via ORAL
  Filled 2015-12-15 (×3): qty 1

## 2015-12-15 MED ORDER — SODIUM CHLORIDE 0.9 % IV SOLN
250.0000 mg | INTRAVENOUS | Status: DC | PRN
  Administered 2015-12-15: 1.75 mg/kg/h via INTRAVENOUS

## 2015-12-15 MED ORDER — ASPIRIN 81 MG PO CHEW
CHEWABLE_TABLET | ORAL | Status: AC
  Filled 2015-12-15: qty 4

## 2015-12-15 MED ORDER — NITROGLYCERIN 1 MG/10 ML FOR IR/CATH LAB
INTRA_ARTERIAL | Status: AC
  Filled 2015-12-15: qty 10

## 2015-12-15 MED ORDER — ASPIRIN 81 MG PO CHEW
324.0000 mg | CHEWABLE_TABLET | Freq: Once | ORAL | Status: AC
  Administered 2015-12-15: 324 mg via ORAL

## 2015-12-15 MED ORDER — NITROGLYCERIN 1 MG/10 ML FOR IR/CATH LAB
INTRA_ARTERIAL | Status: DC | PRN
  Administered 2015-12-15 (×2): 200 ug via INTRACORONARY

## 2015-12-15 MED ORDER — SODIUM CHLORIDE 0.9% FLUSH
3.0000 mL | Freq: Two times a day (BID) | INTRAVENOUS | Status: DC
  Administered 2015-12-15 – 2015-12-16 (×3): 3 mL via INTRAVENOUS

## 2015-12-15 MED ORDER — FENTANYL CITRATE (PF) 100 MCG/2ML IJ SOLN
INTRAMUSCULAR | Status: DC | PRN
  Administered 2015-12-15: 50 ug via INTRAVENOUS

## 2015-12-15 MED ORDER — TICAGRELOR 90 MG PO TABS
90.0000 mg | ORAL_TABLET | Freq: Two times a day (BID) | ORAL | Status: DC
  Administered 2015-12-15 – 2015-12-17 (×4): 90 mg via ORAL
  Filled 2015-12-15 (×4): qty 1

## 2015-12-15 MED ORDER — HEPARIN SODIUM (PORCINE) 5000 UNIT/ML IJ SOLN
INTRAMUSCULAR | Status: AC
  Filled 2015-12-15: qty 1

## 2015-12-15 MED ORDER — HEPARIN (PORCINE) IN NACL 2-0.9 UNIT/ML-% IJ SOLN
INTRAMUSCULAR | Status: DC | PRN
  Administered 2015-12-15: 1500 mL

## 2015-12-15 MED ORDER — LIDOCAINE HCL (PF) 1 % IJ SOLN
INTRAMUSCULAR | Status: AC
  Filled 2015-12-15: qty 30

## 2015-12-15 MED ORDER — SODIUM CHLORIDE 0.9% FLUSH: 3.0000 mL | INTRAVENOUS | Status: DC | PRN

## 2015-12-15 MED ORDER — BIVALIRUDIN 250 MG IV SOLR
INTRAVENOUS | Status: AC
  Filled 2015-12-15: qty 250

## 2015-12-15 MED ORDER — VERAPAMIL HCL 2.5 MG/ML IV SOLN
INTRAVENOUS | Status: DC | PRN
  Administered 2015-12-15: 100 ug via INTRACORONARY
  Administered 2015-12-15 (×3): 125 ug via INTRACORONARY

## 2015-12-15 MED ORDER — FENTANYL CITRATE (PF) 100 MCG/2ML IJ SOLN
INTRAMUSCULAR | Status: AC
  Filled 2015-12-15: qty 2

## 2015-12-15 MED ORDER — ATORVASTATIN CALCIUM 80 MG PO TABS
80.0000 mg | ORAL_TABLET | Freq: Every day | ORAL | Status: DC
  Administered 2015-12-15 – 2015-12-16 (×2): 80 mg via ORAL
  Filled 2015-12-15 (×2): qty 1

## 2015-12-15 MED ORDER — TIROFIBAN (AGGRASTAT) BOLUS VIA INFUSION: 25.0000 ug/kg | Freq: Once | INTRAVENOUS | Status: DC

## 2015-12-15 MED ORDER — LIDOCAINE HCL (PF) 1 % IJ SOLN
INTRAMUSCULAR | Status: DC | PRN
  Administered 2015-12-15: 3 mL

## 2015-12-15 MED ORDER — SODIUM CHLORIDE 0.9 % IV SOLN: 250.0000 mL | INTRAVENOUS | Status: DC | PRN

## 2015-12-15 MED ORDER — HEPARIN (PORCINE) IN NACL 2-0.9 UNIT/ML-% IJ SOLN
INTRAMUSCULAR | Status: AC
  Filled 2015-12-15: qty 1500

## 2015-12-15 MED ORDER — ONDANSETRON 4 MG PO TBDP
4.0000 mg | ORAL_TABLET | Freq: Once | ORAL | Status: AC | PRN
  Administered 2015-12-15: 4 mg via ORAL

## 2015-12-15 MED ORDER — TIROFIBAN (AGGRASTAT) BOLUS VIA INFUSION
INTRAVENOUS | Status: DC | PRN
  Administered 2015-12-15: 1530 ug via INTRAVENOUS

## 2015-12-15 MED ORDER — SODIUM CHLORIDE 0.9 % IV SOLN: INTRAVENOUS | Status: DC | PRN

## 2015-12-15 MED ORDER — METOPROLOL TARTRATE 12.5 MG HALF TABLET
12.5000 mg | ORAL_TABLET | Freq: Two times a day (BID) | ORAL | Status: DC
  Administered 2015-12-15 – 2015-12-17 (×4): 12.5 mg via ORAL
  Filled 2015-12-15 (×4): qty 1

## 2015-12-15 SURGICAL SUPPLY — 20 items
BALLN EUPHORA RX 2.5X12 (BALLOONS) ×2
BALLN ~~LOC~~ EUPHORA RX 3.25X15 (BALLOONS) ×2
BALLOON EUPHORA RX 2.5X12 (BALLOONS) IMPLANT
BALLOON ~~LOC~~ EUPHORA RX 3.25X15 (BALLOONS) IMPLANT
CATH INFINITI 5 FR JL3.5 (CATHETERS) ×1 IMPLANT
CATH INFINITI 5FR JL4 (CATHETERS) ×1 IMPLANT
CATH INFINITI JR4 5F (CATHETERS) ×2 IMPLANT
CATH VISTA GUIDE 6FR JR4 (CATHETERS) ×1 IMPLANT
DEVICE RAD COMP TR BAND LRG (VASCULAR PRODUCTS) ×2 IMPLANT
GLIDESHEATH SLEND A-KIT 6F 22G (SHEATH) ×2 IMPLANT
GUIDE CATH RUNWAY 6FR AL 75 (CATHETERS) ×1 IMPLANT
KIT ENCORE 26 ADVANTAGE (KITS) ×1 IMPLANT
KIT HEART LEFT (KITS) ×2 IMPLANT
PACK CARDIAC CATHETERIZATION (CUSTOM PROCEDURE TRAY) ×2 IMPLANT
STENT PROMUS PREM MR 3.0X20 (Permanent Stent) ×1 IMPLANT
TRANSDUCER W/STOPCOCK (MISCELLANEOUS) ×2 IMPLANT
TUBING CIL FLEX 10 FLL-RA (TUBING) ×2 IMPLANT
WIRE ASAHI PROWATER 180CM (WIRE) ×1 IMPLANT
WIRE HI TORQ VERSACORE-J 145CM (WIRE) ×1 IMPLANT
WIRE SAFE-T 1.5MM-J .035X260CM (WIRE) ×1 IMPLANT

## 2015-12-15 NOTE — ED Notes (Signed)
Code stemi called on pt.  Pt placed on pads

## 2015-12-15 NOTE — ED Notes (Signed)
Sweatshirt, sweatpants, socks, shoes, underwear, purse placed in pt belongings bag and transported with pt.

## 2015-12-15 NOTE — ED Notes (Addendum)
Pt son and family friend notified with pt permission

## 2015-12-15 NOTE — Progress Notes (Signed)
   12/15/15 1234  Clinical Encounter Type  Visited With Health care provider  Visit Type Initial;Code;ED  Referral From Physician   Chaplain responded to a code STEMI in the ED. Patient has been transferred to Cath Lab, and no family is present at this time. Chaplain support available as needed.   Alda Ponder, Chaplain 12/15/2015 12:35 PM

## 2015-12-15 NOTE — ED Provider Notes (Signed)
CSN: [id number]     Arrival date & time 12/15/15  1052 History   First MD Initiated Contact with Patient 12/15/15 1153     Chief Complaint  Patient presents with  . Abdominal Pain  . Referral     (Consider location/radiation/quality/duration/timing/severity/associated sxs/prior Treatment) HPI Comments: Epigastric pain and chest tightness starting yesterday around 2PM Epigastric pain radiating to chest Seen at urgent care and sent here per triage note for rule out AAA Tightness No SOB 8/10 Nausea, dry heaving Nothing makes pain better or worse No med hx    Past Medical History  Diagnosis Date  . ALLERGIC RHINITIS 06/02/2007  . OSTEOPENIA 06/02/2007  . VAGINITIS, ATROPHIC, POSTMENOPAUSAL 12/06/2008  . VERTIGO 02/16/2009  . Glaucoma 1-12   Past Surgical History  Procedure Laterality Date  . Abdominal hysterectomy    . Eye surgery      for glaucoma   Family History  Problem Relation Age of Onset  . Suicidality Mother   . Heart disease Father   . Arthritis Brother    Social History  Substance Use Topics  . Smoking status: Never Smoker   . Smokeless tobacco: Never Used  . Alcohol Use: Yes   OB History    No data available     Review of Systems  Constitutional: Negative for fever. Diaphoresis: felt warm hot/cold.  HENT: Negative for sore throat.   Eyes: Negative for visual disturbance.  Respiratory: Negative for cough and shortness of breath.   Cardiovascular: Positive for chest pain.  Gastrointestinal: Positive for nausea, vomiting and abdominal pain. Negative for diarrhea and constipation.  Genitourinary: Negative for difficulty urinating.  Musculoskeletal: Negative for back pain and neck pain.  Skin: Negative for rash.  Neurological: Negative for syncope and headaches.      Allergies  Codeine sulfate and Meclizine  Home Medications   Prior to Admission medications   Medication Sig Start Date End Date Taking? Authorizing Provider  Calcium  Carbonate-Vitamin D (CALCIUM-D PO) Take 1 tablet by mouth daily.   Yes Historical Provider, MD  carboxymethylcellulose (REFRESH PLUS) 0.5 % SOLN Place 1 drop into both eyes 3 (three) times daily as needed (dry eyes).    Yes Historical Provider, MD  Cholecalciferol (VITAMIN D PO) Take 1 tablet by mouth daily.   Yes Historical Provider, MD  GuaiFENesin (MUCUS RELIEF ADULT PO) Take 1 tablet by mouth daily.    Yes Historical Provider, MD  ibuprofen (ADVIL,MOTRIN) 200 MG tablet Take 200 mg by mouth daily.   Yes Historical Provider, MD  Multiple Vitamin (MULTIVITAMIN WITH MINERALS) TABS tablet Take 1 tablet by mouth daily.   Yes Historical Provider, MD  omeprazole (PRILOSEC OTC) 20 MG tablet Take 20 mg by mouth daily before breakfast.    Yes Historical Provider, MD  OVER THE COUNTER MEDICATION Take 1 tablet by mouth daily. Over the counter antihistamine   Yes Historical Provider, MD  Probiotic Product (PROBIOTIC FORMULA PO) Take 1 tablet by mouth daily.    Yes Historical Provider, MD  VITAMIN E PO Take 1 capsule by mouth daily.   Yes Historical Provider, MD   BP 130/91 mmHg  Pulse 78  Temp(Src) 97.2 F (36.2 C) (Oral)  Resp 15  Ht 5' 5.5" (1.664 m)  Wt 135 lb (61.236 kg)  BMI 22.12 kg/m2  SpO2 100% Physical Exam  Constitutional: She is oriented to person, place, and time. She appears well-developed and well-nourished. No distress.  HENT:  Head: Normocephalic and atraumatic.  Eyes: Conjunctivae and EOM are  normal.  Neck: Normal range of motion.  Cardiovascular: Normal rate, regular rhythm, normal heart sounds and intact distal pulses.  Exam reveals no gallop and no friction rub.   No murmur heard. Pulmonary/Chest: Effort normal and breath sounds normal. No respiratory distress. She has no wheezes. She has no rales.  Abdominal: Soft. She exhibits no distension. There is no tenderness. There is no guarding.  Musculoskeletal: She exhibits no edema or tenderness.  Neurological: She is alert and  oriented to person, place, and time.  Skin: Skin is warm and dry. No rash noted. She is not diaphoretic. No erythema.  Nursing note and vitals reviewed.   ED Course  Procedures (including critical care time) Labs Review Labs Reviewed  COMPREHENSIVE METABOLIC PANEL - Abnormal; Notable for the following:    Glucose, Bld 158 (*)    Total Protein 6.4 (*)    Total Bilirubin 1.4 (*)    All other components within normal limits  TROPONIN I - Abnormal; Notable for the following:    Troponin I 19.57 (*)    All other components within normal limits  BRAIN NATRIURETIC PEPTIDE - Abnormal; Notable for the following:    B Natriuretic Peptide 137.8 (*)    All other components within normal limits  I-STAT TROPOININ, ED - Abnormal; Notable for the following:    Troponin i, poc 0.89 (*)    All other components within normal limits  MRSA PCR SCREENING  LIPASE, BLOOD  CBC  LIPID PANEL  TSH  URINALYSIS, ROUTINE W REFLEX MICROSCOPIC (NOT AT [facility])  TROPONIN I  TROPONIN I  BASIC METABOLIC PANEL  CBC    Imaging Review No results found. I have personally reviewed and evaluated these images and lab results as part of my medical decision-making.   EKG Interpretation   Date/Time:  Saturday December 15 2015 12:03:48 EST Ventricular Rate:  78 PR Interval:  172 QRS Duration: 89 QT Interval:  378 QTC Calculation: 430 R Axis:   67 Text Interpretation:  Sinus rhythm Inferior STEMI Confirmed by [facility]  MD, Jacqlyn Marolf ([id number]) on 12/15/2015 7:54:37 PM      CRITICAL CARE: STEMI Performed by: Rhae Lerner   Total critical care time: 30 minutes  Critical care time was exclusive of separately billable procedures and treating other patients.  Critical care was necessary to treat or prevent imminent or life-threatening deterioration.  Critical care was time spent personally by me on the following activities: development of treatment plan with patient and/or surrogate as well as  nursing, discussions with consultants, evaluation of patient's response to treatment, examination of patient, obtaining history from patient or surrogate, ordering and performing treatments and interventions, ordering and review of laboratory studies, ordering and review of radiographic studies, pulse oximetry and re-evaluation of patient's condition. MDM   Final diagnoses:  Acute transmural inferior wall MI [facility])   83yo female with no significant medical history presents with concern for epigastric pain and chest tightness. Patient was sent from urgent care for concern of abdominal pain.  On arrival to room in ED, EKG was performed which showed inferior STEMI.  Patient given ASA, heparin and Dr. Katrinka Blazing of Cardiology came to the bedside. Pt hemodynamically stable, family notified, taken to cath lab in stable condition.   Alvira Monday, MD 12/15/15 2004

## 2015-12-15 NOTE — ED Notes (Signed)
No answer from pt. In lobby

## 2015-12-15 NOTE — ED Notes (Signed)
Smith MD at bedside ?

## 2015-12-15 NOTE — H&P (Signed)
YOUSRA Neal is a 82 y.o. female  Admit Date: 12/15/2015 Referring Physician: Eleonore Chiquito, MD Primary Cardiologist: [facility] Nancy Neal, M.D. Chief complaint / reason for admission: Acute inferior ST elevation MI  HPI: 82 year old with no prior cardiovascular history and few risk factors presents to the emergency room by car complaining of onset of chest discomfort at 2 PM on the prior afternoon. The discomfort waxed and waned throughout the day. She was uncomfortable during the night. Upon awakening this morning she went to an Urgent Care who refused to see her and said she needed to come to the emergency room. She drove herself to the emergency room and upon arrival an EKG demonstrated ST elevation in the inferior leads of approximately 1-1/2 mm including limb leads II, III, aVF, and V5 and V6. At the time of evaluation she was complaining of pain that was 8/10 in intensity. She denied dyspnea. There were no neurological complaints. She was very conversant. She was nervous. No family was available.  History of reflux has been nonexertional. She exercises 3 times per week without any exercise induced complaints.    PMH:    Past Medical History  Diagnosis Date  . ALLERGIC RHINITIS 06/02/2007  . OSTEOPENIA 06/02/2007  . VAGINITIS, ATROPHIC, POSTMENOPAUSAL 12/06/2008  . VERTIGO 02/16/2009  . Glaucoma 1-12    PSH:    Past Surgical History  Procedure Laterality Date  . Abdominal hysterectomy    . Eye surgery      for glaucoma   ALLERGIES:   Codeine sulfate and Meclizine Prior to Admit Meds:   Prescriptions prior to admission  Medication Sig Dispense Refill Last Dose  . calcium carbonate (CALCIUM 500) 1250 MG tablet Take 1 tablet by mouth daily.     Taking  . carboxymethylcellulose (REFRESH PLUS) 0.5 % SOLN 1 drop 3 (three) times daily as needed.   Taking  . fexofenadine (ALLEGRA) 60 MG tablet Take 60 mg by mouth daily.     Taking  . GuaiFENesin (MUCUS RELIEF ADULT PO) Take 1 tablet  by mouth as needed.   Taking  . ibuprofen (ADVIL,MOTRIN) 600 MG tablet    Taking  . multivitamin (THERAGRAN) per tablet Take 1 tablet by mouth daily.     Taking  . omeprazole (PRILOSEC OTC) 20 MG tablet Take 20 mg by mouth daily.   Taking  . predniSONE (DELTASONE) 20 MG tablet Take 1 tablet (20 mg total) by mouth 2 (two) times daily with a meal. 12 tablet 0   . Probiotic Product (PROBIOTIC FORMULA PO) Take by mouth.   Taking   Family HX:    Family History  Problem Relation Age of Onset  . Suicidality Mother   . Heart disease Father   . Arthritis Brother    Social HX:    Social History   Social History  . Marital Status: Widowed    Spouse Name: N/A  . Number of Children: N/A  . Years of Education: N/A   Occupational History  . Not on file.   Social History Main Topics  . Smoking status: Never Smoker   . Smokeless tobacco: Never Used  . Alcohol Use: Yes  . Drug Use: No  . Sexual Activity: Yes    Birth Control/ Protection: Surgical   Other Topics Concern  . Not on file   Social History Narrative     ROS has difficulty with nasal congestion, allergic rhinitis, osteoarthritis of the knees, history of vaginitis and tissue area. Tissue area  is chronic. All other systems are negative.  Physical Exam: Blood pressure 139/87, pulse 97, temperature 97.6 F (36.4 C), temperature source Oral, resp. rate 16, height 5' 5.5" (1.664 m), weight 135 lb (61.236 kg), SpO2 100 %.    Slightly pale appearing anxious Caucasian female. Very chatty/conversant. She cannot believe she is having an MI. HEENT exam is unremarkable. There is no jaundice or pallor. Neck veins are not distended. No carotid bruits are heard. Chest is clear to auscultation and percussion. Cardiac exam reveals no murmur, rub, click, or gallop. Abdomen is soft. Bowel sounds are normal. No bruits are heard. Extremities reveal no edema. The femoral pulses and radial pulses are 2+ and symmetric. Neurological exam is  unremarkable. Anxiety is notable. No focal motor or sensory deficits are noted.  Labs: Lab Results  Component Value Date   WBC 7.3 12/15/2015   HGB 13.7 12/15/2015   HCT 39.8 12/15/2015   MCV 93.4 12/15/2015   PLT 275 12/15/2015     Recent Labs Lab 12/15/15 1125  NA 138  K 4.1  CL 104  CO2 24  BUN 18  CREATININE 0.85  CALCIUM 9.6  PROT 6.4*  BILITOT 1.4*  ALKPHOS 64  ALT 19  AST 36  GLUCOSE 158*    In 2015 LDL cholesterol was 130. Total cholesterol 213. HDL 60.   Radiology:  acute chest x-ray has not yet been performed   EKG:  Normal sinus rhythm with ST elevation in 2, 3, aVF, and V5 through V6 with receptacle change in 1 and aVL.   ASSESSMENT:  1. Acute inferior ST elevation myocardial infarction with acute symptoms starting at 2 PM on 12/14/2015. She is having significant discomfort at the time of arrival in the EKG appears acute. Likely having a "stuttering" ST elevation MI.   2. Hyperlipidemia with most recent documented LDL of 130 in 2015   Plan:  1. Emergency angiography with intent to perform PCI/reperfusion therapy if indicated. 2.The patient was counseled to undergo left heart catheterization, coronary angiography, and possible percutaneous coronary intervention with stent implantation. The procedural risks and benefits were discussed in detail. The risks discussed included death, stroke, myocardial infarction, life-threatening bleeding, limb ischemia, kidney injury, allergy, and possible emergency cardiac surgery. The risk of these significant complications were estimated to occur less than 1% of the time. After discussion, the patient has agreed to proceed. 3. Risk factor modification as deemed necessary based upon risk profile  38 minutes spent with the patient in the emergency room with evaluation and management. Also accompanied  in transport to the cath lab. Time also spent attempting to contact family to none available.   Nancy Neal 12/15/2015  1:56 PM

## 2015-12-15 NOTE — ED Notes (Signed)
Pt reports epigastric pain that began yesterday, progressively worse and now radiating to her back. Pt was referred here by [facility] for r/o AAA. Pt admits to nausea and diarrhea, denies dizziness or lightheadedness.

## 2015-12-15 NOTE — ED Notes (Signed)
4000u Heparin IV given.  V/o Scholossman MD

## 2015-12-15 NOTE — ED Notes (Signed)
Daughter, Vernona Rieger contacted and notified per pt request.    Daughter-[phone] Friend- Ardeen Jourdain: [phone] Son- Lovina Reach: [phone]

## 2015-12-16 DIAGNOSIS — I25110 Atherosclerotic heart disease of native coronary artery with unstable angina pectoris: Secondary | ICD-10-CM

## 2015-12-16 LAB — CBC
HEMATOCRIT: 35.5 % — AB (ref 36.0–46.0)
Hemoglobin: 11.6 g/dL — ABNORMAL LOW (ref 12.0–15.0)
MCH: 30.9 pg (ref 26.0–34.0)
MCHC: 32.7 g/dL (ref 30.0–36.0)
MCV: 94.4 fL (ref 78.0–100.0)
Platelets: 241 10*3/uL (ref 150–400)
RBC: 3.76 MIL/uL — ABNORMAL LOW (ref 3.87–5.11)
RDW: 13.8 % (ref 11.5–15.5)
WBC: 8.2 10*3/uL (ref 4.0–10.5)

## 2015-12-16 LAB — BASIC METABOLIC PANEL
Anion gap: 9 (ref 5–15)
BUN: 11 mg/dL (ref 6–20)
CHLORIDE: 108 mmol/L (ref 101–111)
CO2: 23 mmol/L (ref 22–32)
Calcium: 8.8 mg/dL — ABNORMAL LOW (ref 8.9–10.3)
Creatinine, Ser: 0.78 mg/dL (ref 0.44–1.00)
GFR calc Af Amer: >60 mL/min (ref >60)
GFR calc non Af Amer: >60 mL/min (ref >60)
Glucose, Bld: 115 mg/dL — ABNORMAL HIGH (ref 65–99)
POTASSIUM: 3.6 mmol/L (ref 3.5–5.1)
SODIUM: 140 mmol/L (ref 135–145)

## 2015-12-16 LAB — TROPONIN I: Troponin I: 18.2 ng/mL (ref <0.031)

## 2015-12-16 MED ORDER — LOPERAMIDE HCL 2 MG PO CAPS
2.0000 mg | ORAL_CAPSULE | ORAL | Status: DC | PRN
  Administered 2015-12-16: 2 mg via ORAL
  Filled 2015-12-16: qty 1

## 2015-12-16 NOTE — Progress Notes (Addendum)
   Subjective:   Admitted 2/25 with inferior STEMI. Cath as below. Completed aggrastat infusion due to heavy thrombus burden. 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  Successful angioplasty and stenting of the right coronary from 100% to 0% using 3.0 x 20 Promus Premier drug-eluting stent postdilated to 3.25 mm in diameter.   Complicated by transient no reflow treated with pharmacology (IC verapamil, IC nitroglycerin, and IV Aggrastat).   Moderate proximal to mid LAD with 60-70% stenosis.  Marked tortuosity noted in a normal circumflex coronary artery  EF 55-65%   Intake/Output Summary (Last 24 hours) at 12/16/15 1141 Last data filed at 12/16/15 1000  Gross per 24 hour  Intake 1715.4 ml  Output    650 ml  Net 1065.4 ml    Current meds: . aspirin  81 mg Oral Daily  . atorvastatin  80 mg Oral q1800  . metoprolol tartrate  12.5 mg Oral BID  . pantoprazole  20 mg Oral Daily  . sodium chloride flush  3 mL Intravenous Q12H  . ticagrelor  90 mg Oral BID   Infusions:     Objective:  Blood pressure 104/71, pulse 68, temperature 98.7 F (37.1 C), temperature source Oral, resp. rate 13, height 5' 5.5" (1.664 m), weight 61.236 kg (135 lb), SpO2 97 %. Weight change:   Physical Exam: General:  Well appearing. No resp difficulty HEENT: normal Neck: supple. JVP . Carotids 2+ bilat; no bruits. No lymphadenopathy or thryomegaly appreciated. Cor: PMI nondisplaced. Regular rate & rhythm. No rubs, gallops or murmurs. Lungs: clear Abdomen: soft, nontender, nondistended. No hepatosplenomegaly. No bruits or masses. Good bowel sounds. Extremities: no cyanosis, clubbing, rash, edema Neuro: alert & orientedx3, cranial nerves grossly intact. moves all 4 extremities w/o difficulty. Affect pleasant  Telemetry: SR  60s  Lab Results: Basic Metabolic Panel:  Recent Labs Lab 12/15/15 1125 12/16/15 0220  NA 138 140  K 4.1 3.6  CL 104 108  CO2 24 23  GLUCOSE 158* 115*  BUN 18 11  CREATININE 0.85 0.78  CALCIUM 9.6 8.8*   Liver Function Tests:  Recent Labs Lab 12/15/15 1125  AST 36  ALT 19  ALKPHOS 64  BILITOT 1.4*  PROT 6.4*  ALBUMIN 4.1    Recent Labs Lab 12/15/15 1125  LIPASE 25   No results for input(s): AMMONIA in the last 168 hours. CBC:  Recent Labs Lab 12/15/15 1125 12/16/15 0220  WBC 7.3 8.2  HGB 13.7 11.6*  HCT 39.8 35.5*  MCV 93.4 94.4  PLT 275 241   Cardiac Enzymes:  Recent Labs Lab 12/15/15 1715 12/15/15 2027 12/16/15 0220  TROPONINI 19.57* 15.68* 18.20*   BNP: Invalid input(s): POCBNP CBG: No results for input(s): GLUCAP in the last 168 hours. Microbiology: No results found for: CULT No results for input(s): CULT, SDES in the last 168 hours.  Imaging: No results found.   ASSESSMENT:  1. Inferior STEMI 2. CAD    --with residual LAD 60-70% stenosis  PLAN/DISCUSSION:  Doing well post-PCI. Continue ASA, Brilinta, statin and b-blocker. Can go to tele. Refer to CR. Probably home in am. Will need outpatient TM/Myoview to further evaluate LAD in several weeks per Dr. Katrinka Blazing.   LOS: 1 day    Arvilla Meres, MD 12/16/2015, 11:41 AM

## 2015-12-16 NOTE — Progress Notes (Signed)
Utilization review completed.  

## 2015-12-17 ENCOUNTER — Other Ambulatory Visit: Payer: Self-pay | Admitting: Cardiology

## 2015-12-17 ENCOUNTER — Inpatient Hospital Stay (HOSPITAL_COMMUNITY): Payer: Medicare Other

## 2015-12-17 ENCOUNTER — Encounter (HOSPITAL_COMMUNITY): Payer: Self-pay | Admitting: Interventional Cardiology

## 2015-12-17 DIAGNOSIS — Z9861 Coronary angioplasty status: Secondary | ICD-10-CM

## 2015-12-17 DIAGNOSIS — I2510 Atherosclerotic heart disease of native coronary artery without angina pectoris: Secondary | ICD-10-CM

## 2015-12-17 DIAGNOSIS — I25119 Atherosclerotic heart disease of native coronary artery with unspecified angina pectoris: Secondary | ICD-10-CM | POA: Diagnosis present

## 2015-12-17 LAB — POCT ACTIVATED CLOTTING TIME: ACTIVATED CLOTTING TIME: 549 s

## 2015-12-17 MED ORDER — NITROGLYCERIN 0.4 MG SL SUBL: 0.4000 mg | SUBLINGUAL_TABLET | SUBLINGUAL | Status: DC | PRN

## 2015-12-17 MED ORDER — ATORVASTATIN CALCIUM 80 MG PO TABS: 80.0000 mg | ORAL_TABLET | Freq: Every day | ORAL | Status: DC

## 2015-12-17 MED ORDER — TICAGRELOR 90 MG PO TABS: 90.0000 mg | ORAL_TABLET | Freq: Two times a day (BID) | ORAL | Status: DC

## 2015-12-17 MED ORDER — IBUPROFEN 200 MG PO TABS: 200.0000 mg | ORAL_TABLET | Freq: Three times a day (TID) | ORAL | Status: DC | PRN

## 2015-12-17 MED ORDER — METOPROLOL TARTRATE 25 MG PO TABS: 12.5000 mg | ORAL_TABLET | Freq: Two times a day (BID) | ORAL | Status: DC

## 2015-12-17 MED ORDER — ACETAMINOPHEN 325 MG PO TABS: 650.0000 mg | ORAL_TABLET | ORAL | Status: AC | PRN

## 2015-12-17 MED ORDER — ASPIRIN 81 MG PO CHEW: 81.0000 mg | CHEWABLE_TABLET | Freq: Every day | ORAL | Status: AC

## 2015-12-17 NOTE — Plan of Care (Signed)
Problem: Skin Integrity: Goal: Risk for impaired skin integrity will decrease Outcome: Not Applicable Date Met:  14/38/88 Patient's braden score is greater than 18.

## 2015-12-17 NOTE — Discharge Instructions (Signed)
Heart Attack °A heart attack (myocardial infarction, MI) causes damage to your heart that cannot be fixed. A heart attack can happen when a heart (coronary) artery becomes blocked or narrowed. This cuts off the blood supply and oxygen to your heart. °When one or more of your coronary arteries becomes blocked, that area of your heart begins to die. This causes the pain you feel during a heart attack. Heart attack pain can also occur in one part of the body but be felt in another part of the body (referred pain). You may feel referred heart attack pain in your left arm, neck, or jaw. Pain may even be felt in the right arm. °CAUSES  °Many conditions can cause a heart attack. These include:  °· Atherosclerosis. This is when a fatty substance (plaque) gradually builds up in the arteries. This buildup can block or reduce the blood supply to one or more of the heart arteries. °· A blood clot. A blood clot can develop suddenly when plaque breaks up (ruptures) within a heart artery. A blood clot can block the heart artery, which prevents blood flow to the heart.   °· Severe tightening (spasm) of the heart artery. This cuts off blood flow through the artery.   °RISK FACTORS °People at risk for heart attack usually have one or more of the following risk factors:  °· High blood pressure (hypertension). °· High cholesterol. °· Smoking. °· Being female. °· Being overweight or obese. °· Older aged.    °· A family history of heart disease. °· Lack of exercise. °· Diabetes. °· Stress. °· Drinking too much alcohol. °· Using illegal street drugs, such as cocaine and methamphetamines. °SYMPTOMS  °Heart attack symptoms can vary from person to person. Symptoms depend on factors like gender and age.  °· In both men and women, heart attack symptoms can include the following:   °¨ Chest pain. This may feel like crushing, squeezing, or a feeling of pressure. °¨ Shortness of breath. °¨ Heartburn or indigestion with or without vomiting,  shortness of breath, or sweating. °¨ Sudden cold sweats. °¨ Sudden light-headedness. °¨ Upper back pain.   °· Women can have unique heart attack symptoms, such as:   °¨ Unexplained feelings of nervousness or anxiety. °¨ Discomfort between the shoulder blades or upper back. °¨ Tingling in the hands and arms.   °· Older people (of both genders) can have subtle heart attack symptoms, such as:   °¨ Sweating. °¨ Shortness of breath. °¨ General tiredness or not feeling well.   °DIAGNOSIS  °Diagnosing a heart attack involves several tests. They include:  °· An assessment of your vital signs. This includes checking your: °¨ Heart rhythm. °¨ Blood pressure. °¨ Breathing rate. °¨ Oxygen level.   °· An ECG (electrocardiogram) to measure the electrical activity of your heart. °· Blood tests called cardiac markers. In these tests, blood is drawn at scheduled times to check for the specific proteins or enzymes released by damaged heart muscle. °· A chest X-ray. °· An echocardiogram to evaluate heart motion and blood flow. °· Coronary angiography to look at the heart arteries.   °TREATMENT  °Treatment for a heart attack may include:  °· Medicine that breaks up or dissolves blood clots in the heart artery. °· Angioplasty. °· Cardiac stent placement. °· Intra-aortic balloon pump therapy (IABP). °· Open heart surgery (coronary artery bypass graft, CABG). °HOME CARE INSTRUCTIONS °· Take medicines only as directed by your health care provider. You may need to take medicine after a heart attack to:   °¨ Keep your blood from   clotting too easily. °¨ Control your blood pressure. °¨ Lower your cholesterol. °¨ Control abnormal heart rhythms.   °· Do not take the following medicines unless your health care provider approves: °¨ Nonsteroidal anti-inflammatory drugs (NSAIDs), such as ibuprofen, naproxen, or celecoxib. °¨ Vitamin supplements that contain vitamin A, vitamin E, or both. °¨ Hormone replacement therapy that contains estrogen with or  without progestin. °· Make lifestyle changes as directed by your health care provider. These may include:   °¨ Quitting smoking, if you smoke. °¨ Getting regular exercise. Ask your health care provider to suggest some activities that are safe for you. °¨ Eating a heart-healthy diet. A registered dietitian can help you learn healthy eating options. °¨ Maintaining a healthy weight.   °¨ Managing diabetes, if necessary. °¨ Reducing stress. °¨ Limiting how much alcohol you drink. °SEEK IMMEDIATE MEDICAL CARE IF:  °· You have sudden, unexplained chest discomfort. °· You have sudden, unexplained discomfort in your arms, back, neck, or jaw. °· You have shortness of breath at any time. °· You suddenly start to sweat or your skin gets clammy. °· You feel nauseous or vomit. °· You suddenly feel light-headed or dizzy. °· Your heart begins to beat fast or feels like it is skipping beats. °These symptoms may represent a serious problem that is an emergency. Do not wait to see if the symptoms will go away. Get medical help right away. Call your local emergency services (911 in the [place] Do not drive yourself to the hospital. °  °This information is not intended to replace advice given to you by your health care provider. Make sure you discuss any questions you have with your health care provider. °  °Document Released: 10/06/2005 Document Revised: 10/27/2014 Document Reviewed: 12/09/2013 °Elsevier Interactive Patient Education ©2016 [employer] °Coronary Angiogram With Stent, Care After °Refer to this sheet in the next few weeks. These instructions provide you with information about caring for yourself after your procedure. Your health care provider may also give you more specific instructions. Your treatment has been planned according to current medical practices, but problems sometimes occur. Call your health care provider if you have any problems or questions after your procedure. °WHAT TO EXPECT AFTER THE PROCEDURE  °After  your procedure, it is typical to have the following: °· Bruising at the catheter insertion site that usually fades within 1-2 weeks. °· Blood collecting in the tissue (hematoma) that may be painful to the touch. It should usually decrease in size and tenderness within 1-2 weeks. °HOME CARE INSTRUCTIONS °· Take medicines only as directed by your health care provider. Blood thinners may be prescribed after your procedure to improve blood flow through the stent. °· You may shower 24-48 hours after the procedure or as directed by your health care provider. Remove the bandage (dressing) and gently wash the catheter insertion site with plain soap and water. Pat the area dry with a clean towel. Do not rub the site, because this may cause bleeding. °· Do not take baths, swim, or use a hot tub until your health care provider approves. °· Check your catheter insertion site every day for redness, swelling, or drainage. °· Do not apply powder or lotion to the site. °· Do not lift over 10 lb (4.5 kg) for 5 days after your procedure or as directed by your health care provider. °· Ask your health care provider when it is okay to: °· Return to work or school. °· Resume usual physical activities or sports. °· Resume sexual activity. °·   Eat a heart-healthy diet. This should include plenty of fresh fruits and vegetables. Meat should be lean cuts. Avoid the following types of food: °· Food that is high in salt. °· Canned or highly processed food. °· Food that is high in saturated fat or sugar. °· Fried food. °· Make any other lifestyle changes as recommended by your health care provider. These may include: °· Not using any tobacco products, including cigarettes, chewing tobacco, or electronic cigarettes. If you need help quitting, ask your health care provider. °· Managing your weight. °· Getting regular exercise. °· Managing your blood pressure. °· Limiting your alcohol intake. °· Managing other health problems, such as  diabetes. °· If you need an MRI after your heart stent has been placed, be sure to tell the health care provider who orders the MRI that you have a heart stent. °· Keep all follow-up visits as directed by your health care provider. This is important. °SEEK MEDICAL CARE IF: °· You have a fever. °· You have chills. °· You have increased bleeding from the catheter insertion site. Hold pressure on the site. °SEEK IMMEDIATE MEDICAL CARE IF: °· You develop chest pain or shortness of breath, feel faint, or pass out. °· You have unusual pain at the catheter insertion site. °· You have redness, warmth, or swelling at the catheter insertion site. °· You have drainage (other than a small amount of blood on the dressing) from the catheter insertion site. °· The catheter insertion site is bleeding, and the bleeding does not stop after 30 minutes of holding steady pressure on the site. °· You develop bleeding from any other place, such as from your rectum. There may be bright red blood in your urine or stool, or it may appear as black, tarry stool. °  °This information is not intended to replace advice given to you by your health care provider. Make sure you discuss any questions you have with your health care provider. °  °Document Released: 04/25/2005 Document Revised: 10/27/2014 Document Reviewed: 02/28/2013 °Elsevier Interactive Patient Education ©2016 [employer] ° °

## 2015-12-17 NOTE — Progress Notes (Signed)
       Patient Name: Nancy Neal Date of Encounter: 12/17/2015    SUBJECTIVE: She has done well. She has multiple questions. She has had no recurrent angina. She denies dyspnea.  TELEMETRY:  Normal sinus rhythm.: Filed Vitals:   12/16/15 1106 12/16/15 2000 12/16/15 2121 12/17/15 0500  BP: 121/89 121/71 121/71 102/63  Pulse:   76 73  Temp: 98.8 F (37.1 C) 97.6 F (36.4 C)  98 F (36.7 C)  TempSrc: Oral Oral    Resp: 16   16  Height:      Weight:    144 lb 4.8 oz (65.454 kg)  SpO2: 98% 98%  97%    Intake/Output Summary (Last 24 hours) at 12/17/15 0756 Last data filed at 12/17/15 0700  Gross per 24 hour  Intake    950 ml  Output    650 ml  Net    300 ml   LABS: Basic Metabolic Panel:  Recent Labs  87/68/11 1125 12/16/15 0220  NA 138 140  K 4.1 3.6  CL 104 108  CO2 24 23  GLUCOSE 158* 115*  BUN 18 11  CREATININE 0.85 0.78  CALCIUM 9.6 8.8*   CBC:  Recent Labs  12/15/15 1125 12/16/15 0220  WBC 7.3 8.2  HGB 13.7 11.6*  HCT 39.8 35.5*  MCV 93.4 94.4  PLT 275 241   Cardiac Enzymes:  Recent Labs  12/15/15 1715 12/15/15 2027 12/16/15 0220  TROPONINI 19.57* 15.68* 18.20*   BNP: Invalid input(s): POCBNP Hemoglobin A1C: No results for input(s): HGBA1C in the last 72 hours. Fasting Lipid Panel:  Recent Labs  12/15/15 1715  CHOL 167  HDL 61  LDLCALC 84  TRIG 112  CHOLHDL 2.7    Radiology/Studies:  Not performed  Physical Exam: Blood pressure 102/63, pulse 73, temperature 98 F (36.7 C), temperature source Oral, resp. rate 16, height 5' 5.5" (1.664 m), weight 144 lb 4.8 oz (65.454 kg), SpO2 97 %. Weight change: 9 lb 4.8 oz (4.218 kg)  Wt Readings from Last 3 Encounters:  12/17/15 144 lb 4.8 oz (65.454 kg)  07/02/15 134 lb (60.782 kg)  06/22/15 137 lb 14.4 oz (62.551 kg)    Radial cath site unremarkable Neck veins not distended Lungs are clear S4 gallop Extremities reveal no edema  ASSESSMENT:  1. 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 Stable since initial reperfusion therapy. 2. Intermediate stenosis proximal to mid LAD. Asymptomatic at this time. 3. Mild hyperlipidemia  Plan:  1. PA and lateral chest x-ray 2. Outpatient myocardial perfusion study to exclude anterior ischemia 3. Eligible for discharge today on low-dose beta blocker therapy, aspirin, Brilinta, and high intensity statin therapy. 4. One week follow-up with an extender. For his six-week follow-up with Mendel Ryder after myocardial perfusion imaging is performed.  Selinda Eon 12/17/2015, 7:56 AM

## 2015-12-17 NOTE — Progress Notes (Signed)
CARDIAC REHAB PHASE I   PRE:  Rate/Rhythm: 68 SR    BP: sitting 118/79    SaO2:   MODE:  Ambulation: 550 ft   POST:  Rate/Rhythm: 80 SR    BP: sitting 129/81     SaO2:   Pt tolerated well. No SOB or other c/o. Ed completed, pt asking many questions. Will send referral for CRPII to [place]. Pt understands her need for Brilinta and other meds.  [phone]   Nancy Neal CES, ACSM 12/17/2015 11:01 AM

## 2015-12-17 NOTE — Plan of Care (Signed)
Problem: Safety: Goal: Ability to remain free from injury will improve Outcome: Completed/Met Date Met:  12/17/15 Pt remains free from falls during admission  Problem: Pain Managment: Goal: General experience of comfort will improve Outcome: Completed/Met Date Met:  12/17/15 Pt remains free from pain  Problem: Activity: Goal: Risk for activity intolerance will decrease Outcome: Completed/Met Date Met:  12/17/15 Pt able to tolerate activity close to baseline  Problem: Fluid Volume: Goal: Ability to maintain a balanced intake and output will improve Outcome: Completed/Met Date Met:  12/17/15 Pt able to maintain adequate intake and output  Problem: Nutrition: Goal: Adequate nutrition will be maintained Outcome: Completed/Met Date Met:  12/17/15 Pt able to tolerate current diet with no difficulties   Problem: Activity: Goal: Ability to tolerate increased activity will improve Outcome: Completed/Met Date Met:  12/17/15 Pt able to tolerate increased activity   Problem: Cardiac: Goal: Vascular access site(s) Level 0-1 will be maintained Outcome: Completed/Met Date Met:  12/17/15 Vascular site level 0     Problem: Education: Goal: Understanding of cardiac disease, CV risk reduction, and recovery process will improve Outcome: Completed/Met Date Met:  12/17/15 Pt received education regarding cardiac disease, CV risk reducion, and recovery processes Goal: Understanding of medication regimen will improve Outcome: Completed/Met Date Met:  12/17/15 Reviewed medications with pt and pt states she understand what they are for and how to take them

## 2015-12-17 NOTE — Care Management Note (Signed)
Case Management Note  Patient Details  Name: MOYA ZHU MRN: [id number] Date of Birth: January 25, 1932  Subjective/Objective:   Pt admitted for Stemi-Pt plans for d/c today on Brilinta. Benefits check in process and will make pt aware once completed.                 Action/Plan:No further needs from CM at this time.    Expected Discharge Date:                  Expected Discharge Plan:  Home/Self Care  In-House Referral:  NA  Discharge planning Services  CM Consult, Medication Assistance  Post Acute Care Choice:  NA Choice offered to:  NA  DME Arranged:  N/A DME Agency:  NA  HH Arranged:  NA HH Agency:  NA  Status of Service:  Completed, signed off  Medicare Important Message Given:    Date Medicare IM Given:    Medicare IM give by:    Date Additional Medicare IM Given:    Additional Medicare Important Message give by:     If discussed at Long Length of Stay Meetings, dates discussed:    Additional Comments:  Gala Lewandowsky, RN 12/17/2015, 10:23 AM

## 2015-12-17 NOTE — Plan of Care (Signed)
Problem: Education: Goal: Knowledge of [facility] Education information/materials will improve Outcome: Progressing RN provided room orientation, also instructed patient on how call staff directly using the phone provided in the room and the numbers that were listed on the white board.  Patient stated understanding.  Patient denied pain thus far this shift.

## 2015-12-17 NOTE — Discharge Summary (Signed)
Discharge Summary    Patient ID: Nancy Neal,  MRN: [id number], DOB/AGE: [birth year] 82 y.o.  Admit date: 12/15/2015 Discharge date: 12/17/2015  Primary Care Provider: Rogelia Boga Primary Cardiologist: Dr Katrinka Blazing  Discharge Diagnoses    Principal Problem:   ST elevation (STEMI) myocardial infarction involving other coronary artery of inferior wall [facility]) Active Problems:   CAD S/P PCI 12/15/15   CAD - residual LAD disease   Hyperlipidemia   Allergies Allergies  Allergen Reactions  . Codeine Sulfate Nausea And Vomiting  . Meclizine Other (See Comments)    glaucoma    Diagnostic Studies/Procedures    Urgent Cath/PCI- 12/15/15 _____________   History of Present Illness     82 y/o female seen in ED with epigastric pain and chest tightness   Hospital Course      82 year old with no prior cardiovascular history and few risk factors presented to the emergency room 12/15/15 by car complaining of chest pain. The discomfort started the previous day and waxed and waned throughout the day. She was uncomfortable during the night. Upon awakening the morning of 12/15/15 she went to an Urgent Care who did not see her but sent her to the emergency room. She drove herself to the emergency room and upon arrival an EKG demonstrated ST elevation in the inferior leads of approximately 1-1/2 mm including limb leads II, III, aVF, and V5 and V6. She was having significant chest pain and was taken to the cath lab as a STEMI.  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 She underwent successful PCI with a Promus DES. The procedure was complicated by transient no reflow treated with pharmacology (IC verapamil, IC nitroglycerin, and IV Aggrastat). There was residual moderate LAD disease of 60-70%. LVF was normal- 55-60%. Her Troponin peaked at 19. She was treated with Aggrestat post PCI secondary to heavy clot  burden. She tolerated this well. She was placed on ASA, high dose statin, and low dose beta blocker and transferred top telemetry.  She was seen by Dr Katrinka Blazing the morning of the 27th and felt to be stable for discharge. She will need a TOC 7-10 f/u with an APP, an OP Lexiscan to assess her LAD, and then f/u with Dr Katrinka Blazing in 6 weeks. Dr Katrinka Blazing ordered a CXR prior to discharge and these results will need to be followed up as an OP. She was seen by Cardiac Rehab prior to discharge.  _____________  Discharge Vitals Blood pressure 102/63, pulse 73, temperature 98 F (36.7 C), temperature source Oral, resp. rate 16, height 5' 5.5" (1.664 m), weight 144 lb 4.8 oz (65.454 kg), SpO2 97 %.  Filed Weights   12/15/15 1116 12/17/15 0500  Weight: 135 lb (61.236 kg) 144 lb 4.8 oz (65.454 kg)    Labs & Radiologic Studies     CBC  Recent Labs  12/15/15 1125 12/16/15 0220  WBC 7.3 8.2  HGB 13.7 11.6*  HCT 39.8 35.5*  MCV 93.4 94.4  PLT 275 241   Basic Metabolic Panel  Recent Labs  12/15/15 1125 12/16/15 0220  NA 138 140  K 4.1 3.6  CL 104 108  CO2 24 23  GLUCOSE 158* 115*  BUN 18 11  CREATININE 0.85 0.78  CALCIUM 9.6 8.8*   Liver Function Tests  Recent Labs  12/15/15 1125  AST 36  ALT 19  ALKPHOS 64  BILITOT 1.4*  PROT 6.4*  ALBUMIN 4.1    Recent Labs  12/15/15 1125  LIPASE 25   Cardiac Enzymes  Recent Labs  12/15/15 1715 12/15/15 2027 12/16/15 0220  TROPONINI 19.57* 15.68* 18.20*   BNP Invalid input(s): POCBNP D-Dimer No results for input(s): DDIMER in the last 72 hours. Hemoglobin A1C No results for input(s): HGBA1C in the last 72 hours. Fasting Lipid Panel  Recent Labs  12/15/15 1715  CHOL 167  HDL 61  LDLCALC 84  TRIG 112  CHOLHDL 2.7   Thyroid Function Tests  Recent Labs  12/15/15 1715  TSH 0.466    No results found.  Disposition   Pt is being discharged home today in good condition.  Follow-up Plans & Appointments    Follow-up  Information    Follow up with [name], MD.   Specialty:  Cardiology   Why:  office will contact you   Contact information:   [address] [place] [address] [phone]        Discharge Medications   Current Discharge Medication List    START taking these medications   Details  acetaminophen (TYLENOL) 325 MG tablet Take 2 tablets (650 mg total) by mouth every 4 (four) hours as needed for headache or mild pain.    aspirin 81 MG chewable tablet Chew 1 tablet (81 mg total) by mouth daily.    atorvastatin (LIPITOR) 80 MG tablet Take 1 tablet (80 mg total) by mouth daily at 6 PM. Qty: 90 tablet, Refills: 3    metoprolol tartrate (LOPRESSOR) 25 MG tablet Take 0.5 tablets (12.5 mg total) by mouth 2 (two) times daily. Qty: 90 tablet, Refills: 3    nitroGLYCERIN (NITROSTAT) 0.4 MG SL tablet Place 1 tablet (0.4 mg total) under the tongue every 5 (five) minutes as needed for chest pain. Qty: 25 tablet, Refills: 2    ticagrelor (BRILINTA) 90 MG TABS tablet Take 1 tablet (90 mg total) by mouth 2 (two) times daily. Qty: 60 tablet, Refills: 11      CONTINUE these medications which have CHANGED   Details  ibuprofen (ADVIL,MOTRIN) 200 MG tablet Take 1 tablet (200 mg total) by mouth every 8 (eight) hours as needed for moderate pain. Qty: 30 tablet, Refills: 0      CONTINUE these medications which have NOT CHANGED   Details  Calcium Carbonate-Vitamin D (CALCIUM-D PO) Take 1 tablet by mouth daily.    carboxymethylcellulose (REFRESH PLUS) 0.5 % SOLN Place 1 drop into both eyes 3 (three) times daily as needed (dry eyes).     GuaiFENesin (MUCUS RELIEF ADULT PO) Take 1 tablet by mouth daily.     Multiple Vitamin (MULTIVITAMIN WITH MINERALS) TABS tablet Take 1 tablet by mouth daily.    omeprazole (PRILOSEC OTC) 20 MG tablet Take 20 mg by mouth daily before breakfast.     OVER THE COUNTER MEDICATION Take 1 tablet by mouth daily. Over the counter  antihistamine    Probiotic Product (PROBIOTIC FORMULA PO) Take 1 tablet by mouth daily.     VITAMIN E PO Take 1 capsule by mouth daily.      STOP taking these medications     Cholecalciferol (VITAMIN D PO)          Aspirin prescribed at discharge?  Yes High Intensity Statin Prescribed? (Lipitor 40-80mg  or Crestor 20-40mg ): Yes Beta Blocker Prescribed? Yes For EF 45% or less, Was ACEI/ARB Prescribed? No: NA ADP Receptor Inhibitor Prescribed? (i.e. Plavix etc.-Includes Medically Managed Patients): Yes For EF <  40%, Aldosterone Inhibitor Prescribed? No: NA Was EF assessed during THIS hospitalization? Yes Was Cardiac Rehab II ordered? (Included Medically managed Patients): Yes   Outstanding Labs/Studies   CXR results  Duration of Discharge Encounter   Greater than 30 minutes including physician time.  Jolene Provost K PA 12/17/2015, 8:16 AM

## 2015-12-17 NOTE — Telephone Encounter (Signed)
[facility] Night - Client TELEPHONE ADVICE RECORD [facility] Patient Name: Nancy Neal Gender: Female DOB: 1931-10-21 Age: 82 Y 4 M 7 D Return Phone Number: [phone] (Primary) Address: City/State/Zip: [place] [address] Client [facility] - Client Client Site [facility] - Night Physician [place] Contact Type Call Who Is Calling Patient / Member / Family / Caregiver Call Type Triage / Clinical Relationship To Patient Self Return Phone Number [phone] (Primary) Chief Complaint Pain - Generalized Reason for Call Symptomatic / Request for Health Information Initial Comment Caller states has pain going down center of front of body and ends around breasts. Also has pain across back of back, diarrhea, and feels she needs to vomit but can't. GOTO Facility Not Listed hospital near [facility] PreDisposition Call Doctor Translation No Nurse Assessment Nurse: Scarlette Ar, RN, Herbert Seta Date/Time (Eastern Time): 12/15/2015 8:39:49 AM Confirm and document reason for call. If symptomatic, describe symptoms. You must click the next button to save text entered. ---Caller states has pain going down center of front of body at her neck and ends around breasts. Also has pain across back of back at the breast level, diarrhea, and feels she needs to vomit but can't. It started yesterday and is worse today Has the patient traveled out of the country within the last 30 days? ---Not Applicable Does the patient have any new or worsening symptoms? ---Yes Will a triage be completed? ---Yes Related visit to physician within the last 2 weeks? ---No Does the PT have any chronic conditions? (i.e. diabetes, asthma, etc.) ---Yes List chronic conditions. ---acid reflux Is this a behavioral health or substance abuse call? ---No Guidelines Guideline Title Affirmed Question Affirmed Notes Nurse Date/Time  (Eastern Time) Chest Pain [1] Chest pain lasts > 5 minutes AND [2] age > 49 Standifer, RN, Herbert Seta 12/15/2015 8:42:50 AM PLEASE NOTE: All timestamps contained within this report are represented as [place] Standard Time. CONFIDENTIALTY NOTICE: This fax transmission is intended only for the addressee. It contains information that is legally privileged, confidential or otherwise protected from use or disclosure. If you are not the intended recipient, you are strictly prohibited from reviewing, disclosing, copying using or disseminating any of this information or taking any action in reliance on or regarding this information. If you have received this fax in error, please notify us immediately by telephone so that we can arrange for its return to [name]. Phone: [phone], Toll-Free: [phone], Fax: [phone] Page: 2 of 2 Call Id: [id number] Disp. Time Lamount Cohen Time) Disposition Final User 12/15/2015 8:49:43 AM 911 Outcome Documentation Standifer, RN, Herbert Seta Reason: Caller states that she will go to the ED, but she will not go by ambulance. 12/15/2015 8:45:00 AM Call EMS 911 Now Yes Standifer, RN, Sibyl Parr Understands: Yes Disagree/Comply: Disagree Disagree/Comply Reason: Disagree with instructions Care Advice Given Per Guideline CALL EMS 911 NOW: Immediate medical attention is needed. You need to hang up and call 911 (or an ambulance). Probation officer Discretion: I'll call you back in a few minutes to be sure you were able to reach them.) CARE ADVICE given per Chest Pain (Adult) guideline. Referrals GO TO FACILITY OTHER - SPECIFY

## 2015-12-17 NOTE — Telephone Encounter (Signed)
FYI

## 2015-12-18 ENCOUNTER — Encounter (HOSPITAL_COMMUNITY): Payer: Self-pay | Admitting: Emergency Medicine

## 2015-12-18 ENCOUNTER — Telehealth: Payer: Self-pay

## 2015-12-18 ENCOUNTER — Telehealth: Payer: Self-pay | Admitting: Cardiology

## 2015-12-18 DIAGNOSIS — R0789 Other chest pain: Secondary | ICD-10-CM | POA: Diagnosis not present

## 2015-12-18 DIAGNOSIS — I2510 Atherosclerotic heart disease of native coronary artery without angina pectoris: Secondary | ICD-10-CM | POA: Insufficient documentation

## 2015-12-18 DIAGNOSIS — K644 Residual hemorrhoidal skin tags: Secondary | ICD-10-CM | POA: Diagnosis not present

## 2015-12-18 DIAGNOSIS — K5730 Diverticulosis of large intestine without perforation or abscess without bleeding: Secondary | ICD-10-CM | POA: Diagnosis not present

## 2015-12-18 DIAGNOSIS — Z7982 Long term (current) use of aspirin: Secondary | ICD-10-CM | POA: Diagnosis not present

## 2015-12-18 DIAGNOSIS — R739 Hyperglycemia, unspecified: Secondary | ICD-10-CM | POA: Diagnosis not present

## 2015-12-18 DIAGNOSIS — Z955 Presence of coronary angioplasty implant and graft: Secondary | ICD-10-CM | POA: Insufficient documentation

## 2015-12-18 DIAGNOSIS — K625 Hemorrhage of anus and rectum: Secondary | ICD-10-CM | POA: Diagnosis not present

## 2015-12-18 DIAGNOSIS — Z7901 Long term (current) use of anticoagulants: Secondary | ICD-10-CM | POA: Diagnosis not present

## 2015-12-18 DIAGNOSIS — I252 Old myocardial infarction: Secondary | ICD-10-CM | POA: Insufficient documentation

## 2015-12-18 DIAGNOSIS — K922 Gastrointestinal hemorrhage, unspecified: Secondary | ICD-10-CM | POA: Diagnosis not present

## 2015-12-18 DIAGNOSIS — J449 Chronic obstructive pulmonary disease, unspecified: Secondary | ICD-10-CM | POA: Insufficient documentation

## 2015-12-18 LAB — COMPREHENSIVE METABOLIC PANEL
ALK PHOS: 63 U/L (ref 38–126)
ALT: 25 U/L (ref 14–54)
AST: 35 U/L (ref 15–41)
Albumin: 4 g/dL (ref 3.5–5.0)
Anion gap: 11 (ref 5–15)
BUN: 18 mg/dL (ref 6–20)
CALCIUM: 10 mg/dL (ref 8.9–10.3)
CHLORIDE: 105 mmol/L (ref 101–111)
CO2: 26 mmol/L (ref 22–32)
CREATININE: 0.93 mg/dL (ref 0.44–1.00)
GFR calc Af Amer: >60 mL/min (ref >60)
GFR, EST NON AFRICAN AMERICAN: 56 mL/min — AB (ref >60)
Glucose, Bld: 100 mg/dL — ABNORMAL HIGH (ref 65–99)
Potassium: 4.4 mmol/L (ref 3.5–5.1)
Sodium: 142 mmol/L (ref 135–145)
Total Bilirubin: 1.5 mg/dL — ABNORMAL HIGH (ref 0.3–1.2)
Total Protein: 6.2 g/dL — ABNORMAL LOW (ref 6.5–8.1)

## 2015-12-18 LAB — CBC
HCT: 41.1 % (ref 36.0–46.0)
Hemoglobin: 13.7 g/dL (ref 12.0–15.0)
MCH: 31 pg (ref 26.0–34.0)
MCHC: 33.3 g/dL (ref 30.0–36.0)
MCV: 93 fL (ref 78.0–100.0)
PLATELETS: 270 10*3/uL (ref 150–400)
RBC: 4.42 MIL/uL (ref 3.87–5.11)
RDW: 13.3 % (ref 11.5–15.5)
WBC: 7.4 10*3/uL (ref 4.0–10.5)

## 2015-12-18 NOTE — Telephone Encounter (Signed)
First TCM attempt  

## 2015-12-18 NOTE — ED Notes (Addendum)
Pt had an episode of rectal bleeding during a bm about 1 hour ago. Pt had a stent placed on Saturday and per pt was started on BRILINTA. Pt denies any abd pain.

## 2015-12-18 NOTE — Telephone Encounter (Signed)
New message      TCM appt on 12-26-15 with Brittainy.  Appt time and date was left on vm and asked pt to call back to confirm.

## 2015-12-18 NOTE — Telephone Encounter (Signed)
Patient contacted regarding discharge from [facility] on December 17, 2015.  Patient understands to follow up with provider Robbie Lis, PA-C on December 26, 2015 at 1:30PM at [facility]. Patient understands discharge instructions? yes Patient understands medications and regiment? yes Patient understands to bring all medications to this visit? yes

## 2015-12-19 ENCOUNTER — Observation Stay (HOSPITAL_COMMUNITY)
Admission: EM | Admit: 2015-12-19 | Discharge: 2015-12-20 | Disposition: A | Discharge status: Home / Self Care | Payer: Medicare Other | Attending: Internal Medicine | Admitting: Internal Medicine

## 2015-12-19 ENCOUNTER — Encounter (HOSPITAL_COMMUNITY): Payer: Self-pay | Admitting: Family Medicine

## 2015-12-19 DIAGNOSIS — Z7901 Long term (current) use of anticoagulants: Secondary | ICD-10-CM | POA: Diagnosis not present

## 2015-12-19 DIAGNOSIS — Z9861 Coronary angioplasty status: Secondary | ICD-10-CM

## 2015-12-19 DIAGNOSIS — K5791 Diverticulosis of intestine, part unspecified, without perforation or abscess with bleeding: Secondary | ICD-10-CM

## 2015-12-19 DIAGNOSIS — K921 Melena: Secondary | ICD-10-CM | POA: Diagnosis not present

## 2015-12-19 DIAGNOSIS — I2510 Atherosclerotic heart disease of native coronary artery without angina pectoris: Secondary | ICD-10-CM

## 2015-12-19 DIAGNOSIS — K625 Hemorrhage of anus and rectum: Secondary | ICD-10-CM | POA: Diagnosis not present

## 2015-12-19 DIAGNOSIS — K922 Gastrointestinal hemorrhage, unspecified: Secondary | ICD-10-CM | POA: Diagnosis present

## 2015-12-19 DIAGNOSIS — K5730 Diverticulosis of large intestine without perforation or abscess without bleeding: Secondary | ICD-10-CM | POA: Insufficient documentation

## 2015-12-19 DIAGNOSIS — K5792 Diverticulitis of intestine, part unspecified, without perforation or abscess without bleeding: Secondary | ICD-10-CM | POA: Insufficient documentation

## 2015-12-19 DIAGNOSIS — Z8719 Personal history of other diseases of the digestive system: Secondary | ICD-10-CM | POA: Insufficient documentation

## 2015-12-19 DIAGNOSIS — Z955 Presence of coronary angioplasty implant and graft: Secondary | ICD-10-CM | POA: Diagnosis not present

## 2015-12-19 HISTORY — DX: Acute myocardial infarction, unspecified: I21.9

## 2015-12-19 HISTORY — DX: Diverticulosis of large intestine without perforation or abscess without bleeding: K57.30

## 2015-12-19 HISTORY — DX: Personal history of other medical treatment: Z92.89

## 2015-12-19 HISTORY — DX: Unspecified osteoarthritis, unspecified site: M19.90

## 2015-12-19 HISTORY — DX: Atherosclerotic heart disease of native coronary artery without angina pectoris: I25.10

## 2015-12-19 HISTORY — DX: Gastro-esophageal reflux disease without esophagitis: K21.9

## 2015-12-19 LAB — TYPE AND SCREEN
ABO/RH(D): O POS
Antibody Screen: POSITIVE
DAT, IgG: NEGATIVE
PT AG TYPE: NEGATIVE

## 2015-12-19 LAB — HEMATOCRIT: HEMATOCRIT: 38.2 % (ref 36.0–46.0)

## 2015-12-19 LAB — HEMOGLOBIN: HEMOGLOBIN: 12.4 g/dL (ref 12.0–15.0)

## 2015-12-19 LAB — GLUCOSE, CAPILLARY: GLUCOSE-CAPILLARY: 96 mg/dL (ref 65–99)

## 2015-12-19 MED ORDER — ACETAMINOPHEN 650 MG RE SUPP: 650.0000 mg | Freq: Four times a day (QID) | RECTAL | Status: DC | PRN

## 2015-12-19 MED ORDER — SODIUM CHLORIDE 0.9 % IV SOLN
80.0000 mg | Freq: Once | INTRAVENOUS | Status: AC
  Administered 2015-12-19: 80 mg via INTRAVENOUS
  Filled 2015-12-19: qty 80

## 2015-12-19 MED ORDER — METOPROLOL TARTRATE 12.5 MG HALF TABLET
12.5000 mg | ORAL_TABLET | Freq: Two times a day (BID) | ORAL | Status: DC
  Administered 2015-12-19 (×2): 12.5 mg via ORAL
  Filled 2015-12-19 (×2): qty 1

## 2015-12-19 MED ORDER — PANTOPRAZOLE SODIUM 40 MG IV SOLR: 40.0000 mg | Freq: Two times a day (BID) | INTRAVENOUS | Status: DC

## 2015-12-19 MED ORDER — CALCIUM CARBONATE-VITAMIN D 500-200 MG-UNIT PO TABS
1.0000 | ORAL_TABLET | Freq: Every day | ORAL | Status: DC
  Administered 2015-12-19: 1 via ORAL
  Filled 2015-12-19: qty 1

## 2015-12-19 MED ORDER — PEG-KCL-NACL-NASULF-NA ASC-C 100 G PO SOLR
0.5000 | Freq: Once | ORAL | Status: AC
  Administered 2015-12-19: 100 g via ORAL
  Filled 2015-12-19: qty 1

## 2015-12-19 MED ORDER — PEG-KCL-NACL-NASULF-NA ASC-C 100 G PO SOLR: 0.5000 | Freq: Once | ORAL | Status: DC

## 2015-12-19 MED ORDER — SODIUM CHLORIDE 0.9% FLUSH
3.0000 mL | Freq: Two times a day (BID) | INTRAVENOUS | Status: DC
  Administered 2015-12-19: 3 mL via INTRAVENOUS

## 2015-12-19 MED ORDER — PEG-KCL-NACL-NASULF-NA ASC-C 100 G PO SOLR
1.0000 | Freq: Once | ORAL | Status: DC
  Filled 2015-12-19: qty 1

## 2015-12-19 MED ORDER — NITROGLYCERIN 0.4 MG SL SUBL: 0.4000 mg | SUBLINGUAL_TABLET | SUBLINGUAL | Status: DC | PRN

## 2015-12-19 MED ORDER — ONDANSETRON HCL 4 MG/2ML IJ SOLN: 4.0000 mg | Freq: Four times a day (QID) | INTRAMUSCULAR | Status: DC | PRN

## 2015-12-19 MED ORDER — CLOPIDOGREL BISULFATE 75 MG PO TABS
75.0000 mg | ORAL_TABLET | Freq: Every day | ORAL | Status: DC
  Administered 2015-12-19: 75 mg via ORAL
  Filled 2015-12-19: qty 1

## 2015-12-19 MED ORDER — PEG-KCL-NACL-NASULF-NA ASC-C 100 G PO SOLR: 1.0000 | Freq: Once | ORAL | Status: DC

## 2015-12-19 MED ORDER — ADULT MULTIVITAMIN W/MINERALS CH
1.0000 | ORAL_TABLET | Freq: Every day | ORAL | Status: DC
  Administered 2015-12-19: 1 via ORAL
  Filled 2015-12-19: qty 1

## 2015-12-19 MED ORDER — SODIUM CHLORIDE 0.9 % IV SOLN
INTRAVENOUS | Status: DC
  Administered 2015-12-19: 06:00:00 via INTRAVENOUS

## 2015-12-19 MED ORDER — ATORVASTATIN CALCIUM 80 MG PO TABS
80.0000 mg | ORAL_TABLET | Freq: Every day | ORAL | Status: DC
  Administered 2015-12-19: 80 mg via ORAL
  Filled 2015-12-19: qty 1

## 2015-12-19 MED ORDER — PANTOPRAZOLE SODIUM 40 MG PO TBEC
40.0000 mg | DELAYED_RELEASE_TABLET | Freq: Every day | ORAL | Status: DC
  Administered 2015-12-19 – 2015-12-20 (×2): 40 mg via ORAL
  Filled 2015-12-19: qty 1

## 2015-12-19 MED ORDER — POLYVINYL ALCOHOL 1.4 % OP SOLN
1.0000 [drp] | Freq: Three times a day (TID) | OPHTHALMIC | Status: DC | PRN
  Filled 2015-12-19: qty 15

## 2015-12-19 MED ORDER — ACETAMINOPHEN 325 MG PO TABS: 650.0000 mg | ORAL_TABLET | Freq: Four times a day (QID) | ORAL | Status: DC | PRN

## 2015-12-19 MED ORDER — ONDANSETRON HCL 4 MG PO TABS: 4.0000 mg | ORAL_TABLET | Freq: Four times a day (QID) | ORAL | Status: DC | PRN

## 2015-12-19 NOTE — Consult Note (Signed)
Name: Nancy Neal is a 82 y.o. female Admit date: 12/19/2015 Referring Physician:  Lambert Keto, M.D. Primary Physician:  Gordy Savers, M.D. Primary Cardiologist:  Cherrie Gauze [name], M.D.  Reason for Consultation:  Bright red blood per rectum  ASSESSMENT: 1. Acute inferior ST elevation myocardial infarction 12/15/15 treated with DES of anomalous origin RCA. Distal territory is large. Dual antiplatelet therapy regimen aspirin and Brilinta. 2. Bright red blood per rectum with known history of diverticulosis, which never previously bled. Has been on chronic 81 mg aspirin daily. 3. Coronary artery disease with moderate LAD residual stenosis.  PLAN:  1. The amount of bleeding appears to be relatively minor. Therefore, I would recommend resuming low-dose aspirin and switching Brilinta to Plavix. Depending upon findings at colonoscopy, we may be forced to live with Plavix only. I would hope that dual antiplatelet therapy can be given for at least one month before transitioning to a single agent. 2. Colonoscopy tomorrow. 3. Follow hemoglobin closely. 4. Monitor for rebleeding. 5. ECG   HPI: 82 year old female relatively benign prior history until 12/15/2015 when she developed an acute inferior MI. Drug-eluting stent implantation was performed in the setting of STEMI to a large distribution right coronary artery. She was discharged after 48 hours on dual antiplatelet therapy (aspirin and Brilinta). Last evening, 3 days after stenting, she had one bowel movement that contain blood. Hemoglobin today is 12.4. Discharge hemoglobin from the MI was 11.6. No subsequent bleeding with 2 loose bowel movements in the emergency room last evening.  She complains of a mild twinge of chest discomfort but no other associated symptom.  PMH:   Past Medical History  Diagnosis Date  . ALLERGIC RHINITIS 06/02/2007  . OSTEOPENIA 06/02/2007  . VAGINITIS, ATROPHIC, POSTMENOPAUSAL 12/06/2008  . VERTIGO 02/16/2009   . Glaucoma 1-12    open angle  . Coronary artery disease 11/2015    STEMI.  Promus DES to RCA.    Marland Kitchen COPD (chronic obstructive pulmonary disease) (HCC)   . Diverticulosis of colon 2003    by 2003 and 2008 colonoscopy.     PSH:   Past Surgical History  Procedure Laterality Date  . Abdominal hysterectomy  age 53  . Eye surgery      for glaucoma  . Cardiac catheterization N/A 12/15/2015    Procedure: Left Heart Cath and Coronary Angiography;  Surgeon: Lyn Records, MD;  Location: [facility] INVASIVE CV LAB;  Service: Cardiovascular;  Laterality: N/A;  . Cardiac catheterization N/A 12/15/2015    Procedure: Coronary Stent Intervention;  Surgeon: Lyn Records, MD;  Location: [facility] INVASIVE CV LAB;  Service: Cardiovascular;  Laterality: N/A;  . Rotator cuff repair Right 11/2014   Allergies:  Codeine sulfate and Meclizine Prior to Admit Meds:   Prescriptions prior to admission  Medication Sig Dispense Refill Last Dose  . acetaminophen (TYLENOL) 325 MG tablet Take 2 tablets (650 mg total) by mouth every 4 (four) hours as needed for headache or mild pain.   12/18/2015 at Unknown time  . aspirin 81 MG chewable tablet Chew 1 tablet (81 mg total) by mouth daily.   12/18/2015 at Unknown time  . atorvastatin (LIPITOR) 80 MG tablet Take 1 tablet (80 mg total) by mouth daily at 6 PM. 90 tablet 3 12/18/2015 at Unknown time  . Calcium Carbonate-Vitamin D (CALCIUM-D PO) Take 1 tablet by mouth daily.   12/18/2015 at Unknown time  . carboxymethylcellulose (REFRESH PLUS) 0.5 % SOLN Place 1 drop into both eyes  3 (three) times daily as needed (dry eyes).    12/18/2015 at Unknown time  . guaiFENesin (MUCINEX) 600 MG 12 hr tablet Take 600 mg by mouth daily.   12/18/2015 at Unknown time  . ibuprofen (ADVIL,MOTRIN) 200 MG tablet Take 1 tablet (200 mg total) by mouth every 8 (eight) hours as needed for moderate pain. 30 tablet 0 12/18/2015 at Unknown time  . metoprolol tartrate (LOPRESSOR) 25 MG tablet Take 0.5 tablets (12.5 mg  total) by mouth 2 (two) times daily. 90 tablet 3 12/18/2015 at 1800  . Multiple Vitamin (MULTIVITAMIN WITH MINERALS) TABS tablet Take 1 tablet by mouth daily.   12/18/2015 at Unknown time  . nitroGLYCERIN (NITROSTAT) 0.4 MG SL tablet Place 1 tablet (0.4 mg total) under the tongue every 5 (five) minutes as needed for chest pain. 25 tablet 2 unknown  . omeprazole (PRILOSEC OTC) 20 MG tablet Take 20 mg by mouth daily before breakfast.    12/18/2015 at Unknown time  . OVER THE COUNTER MEDICATION Take 1 tablet by mouth daily. Over the counter antihistamine   12/18/2015 at Unknown time  . Probiotic Product (PROBIOTIC FORMULA PO) Take 1 tablet by mouth daily.    12/18/2015 at Unknown time  . ticagrelor (BRILINTA) 90 MG TABS tablet Take 1 tablet (90 mg total) by mouth 2 (two) times daily. 60 tablet 11 12/18/2015 at 1800  . VITAMIN E PO Take 1 capsule by mouth daily.   12/18/2015 at Unknown time   Fam HX:    Family History  Problem Relation Age of Onset  . Suicidality Mother   . Heart disease Father   . Arthritis Brother    Social HX:    Social History   Social History  . Marital Status: Widowed    Spouse Name: N/A  . Number of Children: N/A  . Years of Education: N/A   Occupational History  . Not on file.   Social History Main Topics  . Smoking status: Never Smoker   . Smokeless tobacco: Never Used  . Alcohol Use: Yes  . Drug Use: No  . Sexual Activity: Yes    Birth Control/ Protection: Surgical   Other Topics Concern  . Not on file   Social History Narrative     Review of Systems: Alarmed by blood in her stool. No prior history of bleeding despite her history of diverticular disease. 2 very loose diarrheal stools in the emergency room last evening contained no blood... All other systems are negative.  Physical Exam: Blood pressure 154/78, pulse 66, temperature 97.5 F (36.4 C), temperature source Oral, resp. rate 16, height 5\' 5"  (1.651 m), weight 137 lb 9.6 oz (62.415 kg), SpO2 100  %. Weight change:    Appears well. No pallor is noted. HEENT exam is unremarkable. Neck exam reveals no JVD or carotid bruits. Chest is clear to auscultation and percussion. Cardiac exam reveals a gallop, rub, click, or murmur. Abdomen is nontender. Bowel sounds are normal. Labs: Lab Results  Component Value Date   WBC 7.4 12/18/2015   HGB 12.4 12/19/2015   HCT 38.2 12/19/2015   MCV 93.0 12/18/2015   PLT 270 12/18/2015    Recent Labs Lab 12/18/15 2200  NA 142  K 4.4  CL 105  CO2 26  BUN 18  CREATININE 0.93  CALCIUM 10.0  PROT 6.2*  BILITOT 1.5*  ALKPHOS 63  ALT 25  AST 35  GLUCOSE 100*   No results found for: PTT No results found for: INR,  PROTIME Lab Results  Component Value Date   TROPONINI 18.20* 12/16/2015     Lab Results  Component Value Date   CHOL 167 12/15/2015   CHOL 213* 01/04/2014   CHOL 214* 12/18/2011   Lab Results  Component Value Date   HDL 61 12/15/2015   HDL 60.10 01/04/2014   HDL 75.80 12/18/2011   Lab Results  Component Value Date   LDLCALC 84 12/15/2015   LDLCALC 130* 01/04/2014   Lab Results  Component Value Date   TRIG 112 12/15/2015   TRIG 114.0 01/04/2014   TRIG 86.0 12/18/2011   Lab Results  Component Value Date   CHOLHDL 2.7 12/15/2015   CHOLHDL 4 01/04/2014   CHOLHDL 3 12/18/2011   Lab Results  Component Value Date   LDLDIRECT 123.2 12/18/2011   LDLDIRECT 144.6 12/12/2009   LDLDIRECT 154.9 12/06/2008      Radiology: No new data EKG:  Not repeated    [name] 12/19/2015 11:40 AM

## 2015-12-19 NOTE — Consult Note (Signed)
[facility] Consult: 9:40 AM 12/19/2015  LOS: 0 days    Referring Provider: Dr Robb Matar  Primary Care Physician:  Rogelia Boga, MD Primary Gastroenterologist:  Dr. Jarold Motto.      Reason for Consultation:  Hematochezia.    HPI: Nancy Neal is a 82 y.o. female.  Hx COPD, osteopenia, open angle glaucoma. Pt says that when she was in her 30s a now retired MD told her she had intestinal bleeding but at time no endoscopies or surgeries performed.  06/2007 Colonoscopy : Diverticulosis.  06/2002 Colonoscopy:  Normal.   Admission 2/25 - 12/17/15 with STEMI  S/p Promus DES to RCA 12/16/15.  Discharged on Brilinta/81 ASA.  Took  Her last dose of Brilinta in PM of 2/28  ~ 8 PM 2/28 she passed small stool and large volume of deep red blood.  Not dizzy or weak.  Came within 30 mins to ED and had 2 more episodes of smaller volume, less bright red, bleeding she describes as disrrhea.  Last episode was several hours ago.  No real abd pain, just intestinal upset and noises that felt like she was having diarrhea. Stable BP and pulse.  Hgb 12.4 this AM, was 13.7 on arrival 2/28, 11.6 on 2/26.   BUN and creatinine normal.   Pt has not GERD sxs, though taking Omeprazole daily.  No NSAIDs.  No ETOH.  No hx scant bleeding PR.  No weight loss or anorexia.  Generally very active.     Past Medical History  Diagnosis Date  . ALLERGIC RHINITIS 06/02/2007  . OSTEOPENIA 06/02/2007  . VAGINITIS, ATROPHIC, POSTMENOPAUSAL 12/06/2008  . VERTIGO 02/16/2009  . Glaucoma 1-12    open angle  . Coronary artery disease 11/2015    STEMI.  Promus DES to RCA.    Marland Kitchen COPD (chronic obstructive pulmonary disease) (HCC)   . Diverticulosis of colon 2003    by 2003 and 2008 colonoscopy.     Past Surgical History  Procedure Laterality Date  .  Abdominal hysterectomy  age 19  . Eye surgery      for glaucoma  . Cardiac catheterization N/A 12/15/2015    Procedure: Left Heart Cath and Coronary Angiography;  Surgeon: Lyn Records, MD;  Location: [facility] INVASIVE CV LAB;  Service: Cardiovascular;  Laterality: N/A;  . Cardiac catheterization N/A 12/15/2015    Procedure: Coronary Stent Intervention;  Surgeon: Lyn Records, MD;  Location: [facility] INVASIVE CV LAB;  Service: Cardiovascular;  Laterality: N/A;  . Rotator cuff repair Right 11/2014    Prior to Admission medications   Medication Sig Start Date End Date Taking? Authorizing Provider  acetaminophen (TYLENOL) 325 MG tablet Take 2 tablets (650 mg total) by mouth every 4 (four) hours as needed for headache or mild pain. 12/17/15  Yes Abelino Derrick, PA-C  aspirin 81 MG chewable tablet Chew 1 tablet (81 mg total) by mouth daily. 12/17/15  Yes Luke K Kilroy, PA-C  atorvastatin (LIPITOR) 80 MG tablet Take 1 tablet (80 mg total) by mouth daily at 6 PM. 12/17/15  Yes  Abelino Derrick, PA-C  Calcium Carbonate-Vitamin D (CALCIUM-D PO) Take 1 tablet by mouth daily.   Yes Historical Provider, MD  carboxymethylcellulose (REFRESH PLUS) 0.5 % SOLN Place 1 drop into both eyes 3 (three) times daily as needed (dry eyes).    Yes Historical Provider, MD  guaiFENesin (MUCINEX) 600 MG 12 hr tablet Take 600 mg by mouth daily.   Yes Historical Provider, MD         metoprolol tartrate (LOPRESSOR) 25 MG tablet Take 0.5 tablets (12.5 mg total) by mouth 2 (two) times daily. 12/17/15  Yes Abelino Derrick, PA-C  Multiple Vitamin (MULTIVITAMIN WITH MINERALS) TABS tablet Take 1 tablet by mouth daily.   Yes Historical Provider, MD  nitroGLYCERIN (NITROSTAT) 0.4 MG SL tablet Place 1 tablet (0.4 mg total) under the tongue every 5 (five) minutes as needed for chest pain. 12/17/15  Yes Luke K Kilroy, PA-C  omeprazole (PRILOSEC OTC) 20 MG tablet Take 20 mg by mouth daily before breakfast.    Yes Historical Provider, MD  OVER THE COUNTER  MEDICATION Take 1 tablet by mouth daily. Over the counter antihistamine   Yes Historical Provider, MD  Probiotic Product (PROBIOTIC FORMULA PO) Take 1 tablet by mouth daily.    Yes Historical Provider, MD  ticagrelor (BRILINTA) 90 MG TABS tablet Take 1 tablet (90 mg total) by mouth 2 (two) times daily. 12/17/15  Yes Luke K Kilroy, PA-C  VITAMIN E PO Take 1 capsule by mouth daily.   Yes Historical Provider, MD    Scheduled Meds: . atorvastatin  80 mg Oral q1800  . calcium-vitamin D  1 tablet Oral Daily  . metoprolol tartrate  12.5 mg Oral BID  . multivitamin with minerals  1 tablet Oral Daily  . pantoprazole  40 mg Oral Q0600  . sodium chloride flush  3 mL Intravenous Q12H   Infusions: . sodium chloride 75 mL/hr at 12/19/15 0550   PRN Meds: acetaminophen **OR** acetaminophen, nitroGLYCERIN, ondansetron **OR** ondansetron (ZOFRAN) IV, polyvinyl alcohol   Allergies as of 12/18/2015 - Review Complete 12/18/2015  Allergen Reaction Noted  . Codeine sulfate Nausea And Vomiting 02/16/2009  . Meclizine Other (See Comments) 12/25/2010    Family History  Problem Relation Age of Onset  . Suicidality Mother   . Heart disease Father   . Arthritis Brother     Social History   Social History  . Marital Status: Widowed    Spouse Name: N/A  . Number of Children: N/A  . Years of Education: N/A   Occupational History  . Not on file.   Social History Main Topics  . Smoking status: Never Smoker   . Smokeless tobacco: Never Used  . Alcohol Use: Yes  . Drug Use: No  . Sexual Activity: Yes    Birth Control/ Protection: Surgical   Other Topics Concern  . Not on file   Social History Narrative    REVIEW OF SYSTEMS: Constitutional:  Per HPI ENT:  No nose bleeds Pulm:  No SOB or cough CV:  No palpitations, no LE edema.  GU:  No hematuria, no frequency GI:  Per HPI.  No dysphagia Heme:  No unusual bleeding from skin or orifices.  No large bruises   Transfusions:  Age 17 at time of  hysterectomy.  Neuro:  No headaches, no peripheral tingling or numbness Derm:  No itching, no rash or sores.  Endocrine:  No sweats or chills.  No polyuria or dysuria Immunization:  Reviewed. Flu, tetanus, pnvx, zoster  up to date.  Travel:  None beyond local counties in last few months.    PHYSICAL EXAM: Vital signs in last 24 hours: Filed Vitals:   12/19/15 0245 12/19/15 0310  BP: 136/72 154/78  Pulse: 68 66  Temp:  97.5 F (36.4 C)  Resp: 16 16   Wt Readings from Last 3 Encounters:  12/19/15 62.415 kg (137 lb 9.6 oz)  12/17/15 65.454 kg (144 lb 4.8 oz)  07/02/15 60.782 kg (134 lb)    General: pleasant, healthy, comfortable looks like she is > 10 yrs younger than 33 Head:  No trauma, asymmetry or swellilng  Eyes:  No icterus or conj pallor Ears:  Not HOH  Nose:  No discharge Mouth:  Clear, moist.  Excellent teeth.  Neck:  No JVD, no TMG or mass Lungs:  Clear bil.  No labored resps or cough Heart: RRR.  No MRG.  S1/S@ audible Abdomen:  Soft, NT, ND.  No mass or HSM.  Active BS.  No bruits or hernia.   Rectal: pasty medium red, blood mixed with stool appearance   Musc/Skeltl: no joint swelling redness or gross deformities Extremities:  No CCE.  Feet warm and well perfused  Neurologic:  Oriented x 3.  Fully alert.  No limb weakness, no tremor Skin:  No sores, rashes or telangectasia. Tattoos:  none Nodes:  No cervical adenopathy   Psych:  Pleasant, calm, appropriate  Intake/Output from previous day:   Intake/Output this shift:    LAB RESULTS:  Recent Labs  12/18/15 2200 12/19/15 0550  WBC 7.4  --   HGB 13.7 12.4  HCT 41.1 38.2  PLT 270  --    BMET Lab Results  Component Value Date   NA 142 12/18/2015   NA 140 12/16/2015   NA 138 12/15/2015   K 4.4 12/18/2015   K 3.6 12/16/2015   K 4.1 12/15/2015   CL 105 12/18/2015   CL 108 12/16/2015   CL 104 12/15/2015   CO2 26 12/18/2015   CO2 23 12/16/2015   CO2 24 12/15/2015   GLUCOSE 100* 12/18/2015    GLUCOSE 115* 12/16/2015   GLUCOSE 158* 12/15/2015   BUN 18 12/18/2015   BUN 11 12/16/2015   BUN 18 12/15/2015   CREATININE 0.93 12/18/2015   CREATININE 0.78 12/16/2015   CREATININE 0.85 12/15/2015   CALCIUM 10.0 12/18/2015   CALCIUM 8.8* 12/16/2015   CALCIUM 9.6 12/15/2015   LFT  Recent Labs  12/18/15 2200  PROT 6.2*  ALBUMIN 4.0  AST 35  ALT 25  ALKPHOS 63  BILITOT 1.5*   PT/INR No results found for: INR, PROTIME Hepatitis Panel No results for input(s): HEPBSAG, HCVAB, HEPAIGM, HEPBIGM in the last 72 hours. C-Diff No components found for: CDIFF Lipase     Component Value Date/Time   LIPASE 25 12/15/2015 1125    Drugs of Abuse  No results found for: LABOPIA, COCAINSCRNUR, LABBENZ, AMPHETMU, THCU, LABBARB   RADIOLOGY STUDIES: No results found.  ENDOSCOPIC STUDIES: Per HPI  IMPRESSION:   *  Hematochezia in pt just started DAP (ASA/Brilinta). Suspect diverticular bleed.  Diverticulosis on colonoscopies in 2003, 2008  *  Acute MI, s/p 12/16/15 Promus DES to RCA.    *  Hyperglycemia, does not carry DM 2 diagnosis.    PLAN:     *  Colonoscopy tomorrow.  If rebleeds: nuc RBC scan.   *  CBC in AM.  Switch to q day oral PPI as at home.  *  Clears.    Jennye Moccasin  12/19/2015, 9:40 AM Pager: [phone]  GI ATTENDING  History, laboratories, x-rays, previous endoscopy reports reviewed. Patient personally seen and examined. Agree with comprehensive consultation note as outlined above. Complex 82 year old female with COPD and coronary artery disease with acute MI and right coronary artery stent placement last week. On Brilinta and aspirin. Now with hemodynamically stable lower GI bleeding. No obvious cause on rectal exam. Last colonoscopy about 9 years ago. May be minor diverticular bleed, internal hemorrhoids, or neoplasia. Needs evaluated is the patient will need to be maintained on Brilinta. We will keep her on Brilinta for the colonoscopy tomorrow. Patient is  high risk.The nature of the procedure, as well as the risks, benefits, and alternatives were carefully and thoroughly reviewed with the patient. Ample time for discussion and questions allowed. The patient understood, was satisfied, and agreed to proceed.  Wilhemina Bonito. Eda Keys., M.D. [facility] Division of Gastroenterology

## 2015-12-19 NOTE — Care Management Obs Status (Signed)
MEDICARE OBSERVATION STATUS NOTIFICATION   Patient Details  Name: Nancy Neal MRN: [id number] Date of Birth: October 05, 1932   Medicare Observation Status Notification Given:  Yes (GI Bleed)    Gala Lewandowsky, RN 12/19/2015, 3:14 PM

## 2015-12-19 NOTE — Progress Notes (Signed)
[facility] PROGRESS NOTE    Progress Note   Nancy WEAVIL CHY:[id number] DOB: 10/20/1932 DOA: 12/19/2015 PCP: Rogelia Boga, MD   Brief Narrative:   Nancy Neal is an 82 y.o. female with PMH of obstructive coronary artery disease with placement of stent to RCA on 12/16/2015 and now presenting with one episode involving a bloody bowel movement. The patient had just been discharged from this institution on 12/17/2015 following placement of the stent. She was discharged with Brilinta as a new medication  Assessment/Plan:   Lower GI bleeding Painless bleeding, hold ASA and brillinta, Last Hbg 13.7 on admission 12.4, she seems intravascular depleted, so i expect hbg to drop. GI already consult had a colonoscopy in 2008 showed diverticulitis, which is most likely the cause. Place NPO, cont IV lfuids.  CAD S/P PCI 12/15/15: Asx, hold ASA and brillinata. Cards consulted.    DVT Prophylaxis - SCD's  Family Communication: none Disposition Plan: Home in 2-3 days Code Status:     Code Status Orders        Start     Ordered   12/19/15 0501  Full code   Continuous     12/19/15 0502    Code Status History    Date Active Date Inactive Code Status Order ID Comments User Context   This patient has a current code status but no historical code status.    Advance Directive Documentation        Most Recent Value   Type of Advance Directive  Healthcare Power of Attorney   Pre-existing out of facility DNR order (yellow form or pink MOST form)     "MOST" Form in Place?          IV Access:    Peripheral IV   Procedures and diagnostic studies:   Dg Chest 2 View  12/17/2015  CLINICAL DATA:  Status post previous MI, clinically improved. EXAM: CHEST  2 VIEW COMPARISON:  PA and lateral chest x-ray of September 28, 2008 FINDINGS: The lungs remain hyperinflated. There is minimal blunting of the right lateral and posterior costophrenic angles. There is no alveolar  infiltrate. There is no pleural effusion. The heart and pulmonary vascularity are normal. The mediastinum is normal in width. There is mild multilevel degenerative disc disease of the thoracic spine. IMPRESSION: COPD. Blunting of the right lateral and posterior costophrenic angles may be acute or chronic but appears new since 2009. There is no evidence of pulmonary edema or pneumonia. Electronically Signed   By: David  [place] M.D.   On: 12/17/2015 08:38     Medical Consultants:    None.  Anti-Infectives:   Anti-infectives    None      Subjective:    [name] no complains,  No abd pain.  Objective:    Filed Vitals:   12/19/15 0215 12/19/15 0230 12/19/15 0245 12/19/15 0310  BP: 122/66 125/70 136/72 154/78  Pulse: 66 65 68 66  Temp:    97.5 F (36.4 C)  TempSrc:    Oral  Resp: 18 17 16 16   Height:    5\' 5"  (1.651 m)  Weight:    62.415 kg (137 lb 9.6 oz)  SpO2: 99% 100% 97% 100%   No intake or output data in the 24 hours ending 12/19/15 0758 Filed Weights   12/19/15 0310  Weight: 62.415 kg (137 lb 9.6 oz)    Exam: Gen:  NAD Cardiovascular:  RRR. Chest and lungs:   CTAB Abdomen:  Abdomen  soft, NT/ND, + BS Extremities:  No edema   Data Reviewed:    Labs: Basic Metabolic Panel:  Recent Labs Lab 12/15/15 1125 12/16/15 0220 12/18/15 2200  NA 138 140 142  K 4.1 3.6 4.4  CL 104 108 105  CO2 GLUCOSE 158* 115* 100*  BUN CREATININE 0.85 0.78 0.93  CALCIUM 9.6 8.8* 10.0   GFR Estimated Creatinine Clearance: 42.7 mL/min (by C-G formula based on Cr of 0.93). Liver Function Tests:  Recent Labs Lab 12/15/15 1125 12/18/15 2200  AST 36 35  ALT 19 25  ALKPHOS 64 63  BILITOT 1.4* 1.5*  PROT 6.4* 6.2*  ALBUMIN 4.1 4.0    Recent Labs Lab 12/15/15 1125  LIPASE 25   No results for input(s): AMMONIA in the last 168 hours. Coagulation profile No results for input(s): INR, PROTIME in the last 168 hours.  CBC:  Recent Labs Lab  12/15/15 1125 12/16/15 0220 12/18/15 2200 12/19/15 0550  WBC 7.3 8.2 7.4  --   HGB 13.7 11.6* 13.7 12.4  HCT 39.8 35.5* 41.1 38.2  MCV 93.4 94.4 93.0  --   PLT 275 241 270  --    Cardiac Enzymes:  Recent Labs Lab 12/15/15 1715 12/15/15 2027 12/16/15 0220  TROPONINI 19.57* 15.68* 18.20*   BNP (last 3 results) No results for input(s): PROBNP in the last 8760 hours. CBG:  Recent Labs Lab 12/19/15 0720  GLUCAP 96   D-Dimer: No results for input(s): DDIMER in the last 72 hours. Hgb A1c: No results for input(s): HGBA1C in the last 72 hours. Lipid Profile: No results for input(s): CHOL, HDL, LDLCALC, TRIG, CHOLHDL, LDLDIRECT in the last 72 hours. Thyroid function studies: No results for input(s): TSH, T4TOTAL, T3FREE, THYROIDAB in the last 72 hours.  Invalid input(s): FREET3 Anemia work up: No results for input(s): VITAMINB12, FOLATE, FERRITIN, TIBC, IRON, RETICCTPCT in the last 72 hours. Sepsis Labs:  Recent Labs Lab 12/15/15 1125 12/16/15 0220 12/18/15 2200  WBC 7.3 8.2 7.4   Microbiology Recent Results (from the past 240 hour(s))  MRSA PCR Screening     Status: None   Collection Time: 12/15/15  3:34 PM  Result Value Ref Range Status   MRSA by PCR NEGATIVE NEGATIVE Final    Comment:        The GeneXpert MRSA Assay (FDA approved for NASAL specimens only), is one component of a comprehensive MRSA colonization surveillance program. It is not intended to diagnose MRSA infection nor to guide or monitor treatment for MRSA infections.      Medications:   . atorvastatin  80 mg Oral q1800  . calcium-vitamin D  1 tablet Oral Daily  . metoprolol tartrate  12.5 mg Oral BID  . multivitamin with minerals  1 tablet Oral Daily  . pantoprazole (PROTONIX) IV  40 mg Intravenous Q12H  . sodium chloride flush  3 mL Intravenous Q12H   Continuous Infusions: . sodium chloride 75 mL/hr at 12/19/15 0550    Time spent: 25 min   LOS: 0 days   Marinda Elk  [facility] Pager [phone]  *Please refer to [email or web address], password [email or web address] to get updated schedule on who will round on this patient, as hospitalists switch teams weekly. If 7PM-7AM, please contact night-coverage at [email or web address], password [email or web address] for any overnight needs.  12/19/2015, 7:58 AM

## 2015-12-19 NOTE — H&P (Signed)
[facility] History and Physical  Nancy Neal ZOX:[id number] DOB: 25-Oct-1931 DOA: 12/19/2015  Referring physician: ED physician PCP: Rogelia Boga, MD  Specialists:  Dr. Katrinka Blazing (cardiology)   Chief Complaint:  Bloody BM   HPI: Nancy Neal is a 82 y.o. female with PMH of obstructive coronary artery disease with placement of stent to RCA on 12/16/2015 and now presenting with one episode involving a bloody bowel movement. The patient had just been discharged from this institution on 12/17/2015 following placement of the stent. She was discharged with Brilinta as a new medication. She had been doing quite well at home when on the evening of 12/18/2015, she had a bowel movement which reportedly contained a large volume of dark red blood. Patient reports having 2 subsequent bowel movements while in the ED waiting room, neither of which were noted to contain blood. The patient notes that the Brilinta package insert is advised that his seeking immediate medical care for abnormal bleeding. Patient followed through with this recommendation and came into the ED for evaluation. She had otherwise reported feeling quite well without fevers, chills, chest pain, dyspnea, abdominal pain, or GI upset. There is been no nausea or vomiting. That too nonbloody stools that she had while in the emergency department waiting room are described as loose.  In ED, patient was found to be afebrile, saturating well on room air, and with vital signs stable. CMP is notable for a mild elevation in serum bilirubin to a value of 1.5. CBC is essentially normal with a hemoglobin of 13.7 and hematocrit of 41.1. There is brown stool on DRE with weakly positive FOBT. Chest x-ray is obtained and not particularly remarkable. Gastroenterology was consulted from the emergency department and advised admitting the patient under observation status for evaluation and management of apparent GI bleed after just starting Brilinta.  Where  does patient live?   At home    Can patient participate in ADLs?  Yes        Review of Systems:   General: no fevers, chills, sweats, weight change, poor appetite, or fatigue HEENT: no blurry vision, hearing changes or sore throat Pulm: no dyspnea, cough, or wheeze CV: no chest pain or palpitations Abd: no nausea, vomiting, abdominal pain, or constipation. 2 loose stools tonight.  GU: no dysuria, hematuria, increased urinary frequency, or urgency  Ext: no leg edema Neuro: no focal weakness, numbness, or tingling, no vision change or hearing loss Skin: no rash, no wounds MSK: No muscle spasm, no deformity, no red, hot, or swollen joint Heme: No easy bruising or bleeding prior to tonight Travel history: No recent long distant travel    Allergy:  Allergies  Allergen Reactions  . Codeine Sulfate Nausea And Vomiting  . Meclizine Other (See Comments)    glaucoma    Past Medical History  Diagnosis Date  . ALLERGIC RHINITIS 06/02/2007  . OSTEOPENIA 06/02/2007  . VAGINITIS, ATROPHIC, POSTMENOPAUSAL 12/06/2008  . VERTIGO 02/16/2009  . Glaucoma 1-12  . Coronary artery disease     Past Surgical History  Procedure Laterality Date  . Abdominal hysterectomy    . Eye surgery      for glaucoma  . Cardiac catheterization N/A 12/15/2015    Procedure: Left Heart Cath and Coronary Angiography;  Surgeon: Lyn Records, MD;  Location: [facility] INVASIVE CV LAB;  Service: Cardiovascular;  Laterality: N/A;  . Cardiac catheterization N/A 12/15/2015    Procedure: Coronary Stent Intervention;  Surgeon: Lyn Records, MD;  Location: [facility] INVASIVE  CV LAB;  Service: Cardiovascular;  Laterality: N/A;    Social History:  reports that she has never smoked. She has never used smokeless tobacco. She reports that she drinks alcohol. She reports that she does not use illicit drugs.  Family History:  Family History  Problem Relation Age of Onset  . Suicidality Mother   . Heart disease Father   . Arthritis Brother       Prior to Admission medications   Medication Sig Start Date End Date Taking? Authorizing Provider  acetaminophen (TYLENOL) 325 MG tablet Take 2 tablets (650 mg total) by mouth every 4 (four) hours as needed for headache or mild pain. 12/17/15  Yes Abelino Derrick, PA-C  aspirin 81 MG chewable tablet Chew 1 tablet (81 mg total) by mouth daily. 12/17/15  Yes Luke K Kilroy, PA-C  atorvastatin (LIPITOR) 80 MG tablet Take 1 tablet (80 mg total) by mouth daily at 6 PM. 12/17/15  Yes Abelino Derrick, PA-C  Calcium Carbonate-Vitamin D (CALCIUM-D PO) Take 1 tablet by mouth daily.   Yes Historical Provider, MD  carboxymethylcellulose (REFRESH PLUS) 0.5 % SOLN Place 1 drop into both eyes 3 (three) times daily as needed (dry eyes).    Yes Historical Provider, MD  guaiFENesin (MUCINEX) 600 MG 12 hr tablet Take 600 mg by mouth daily.   Yes Historical Provider, MD  ibuprofen (ADVIL,MOTRIN) 200 MG tablet Take 1 tablet (200 mg total) by mouth every 8 (eight) hours as needed for moderate pain. 12/17/15  Yes Luke K Kilroy, PA-C  metoprolol tartrate (LOPRESSOR) 25 MG tablet Take 0.5 tablets (12.5 mg total) by mouth 2 (two) times daily. 12/17/15  Yes Abelino Derrick, PA-C  Multiple Vitamin (MULTIVITAMIN WITH MINERALS) TABS tablet Take 1 tablet by mouth daily.   Yes Historical Provider, MD  nitroGLYCERIN (NITROSTAT) 0.4 MG SL tablet Place 1 tablet (0.4 mg total) under the tongue every 5 (five) minutes as needed for chest pain. 12/17/15  Yes Luke K Kilroy, PA-C  omeprazole (PRILOSEC OTC) 20 MG tablet Take 20 mg by mouth daily before breakfast.    Yes Historical Provider, MD  OVER THE COUNTER MEDICATION Take 1 tablet by mouth daily. Over the counter antihistamine   Yes Historical Provider, MD  Probiotic Product (PROBIOTIC FORMULA PO) Take 1 tablet by mouth daily.    Yes Historical Provider, MD  ticagrelor (BRILINTA) 90 MG TABS tablet Take 1 tablet (90 mg total) by mouth 2 (two) times daily. 12/17/15  Yes Luke K Kilroy, PA-C   VITAMIN E PO Take 1 capsule by mouth daily.   Yes Historical Provider, MD    Physical Exam: Filed Vitals:   12/19/15 0215 12/19/15 0230 12/19/15 0245 12/19/15 0310  BP: 122/66 125/70 136/72 154/78  Pulse: 66 65 68 66  Temp:    97.5 F (36.4 C)  TempSrc:    Oral  Resp: Height:     (1.651 m)  Weight:    62.415 kg (137 lb 9.6 oz)  SpO2: 99% 100% 97% 100%   General: Not in acute distress HEENT:       Eyes: PERRL, EOMI, no scleral icterus or conjunctival pallor.       ENT: No discharge from the ears or nose, no pharyngeal ulcers, petechiae or exudate         Neck: No JVD, no bruit, no appreciable mass Heme: No cervical adenopathy, no pallor Cardiac: S1/S2, RRR, No murmurs, No gallops or rubs. Pulm: Good air  movement bilaterally. No rales, wheezing, rhonchi or rubs. Abd: Soft, nondistended, nontender, no rebound pain or gaurding, no mass or organomegaly, BS present. Ext: No LE edema bilaterally. 2+DP/PT pulse bilaterally. Musculoskeletal: No gross deformity, no red, hot, swollen joints   Skin: No rashes or wounds on exposed surfaces  Neuro: Alert, oriented X3, cranial nerves II-XII grossly intact. No focal findings Psych: Patient is not overtly psychotic, appropriate mood and affect.  Labs on Admission:  Basic Metabolic Panel:  Recent Labs Lab 12/15/15 1125 12/16/15 0220 12/18/15 2200  NA 138 140 142  K 4.1 3.6 4.4  CL 104 108 105  CO2 24 23 26   GLUCOSE 158* 115* 100*  BUN 18 11 18   CREATININE 0.85 0.78 0.93  CALCIUM 9.6 8.8* 10.0   Liver Function Tests:  Recent Labs Lab 12/15/15 1125 12/18/15 2200  AST 36 35  ALT 19 25  ALKPHOS 64 63  BILITOT 1.4* 1.5*  PROT 6.4* 6.2*  ALBUMIN 4.1 4.0    Recent Labs Lab 12/15/15 1125  LIPASE 25   No results for input(s): AMMONIA in the last 168 hours. CBC:  Recent Labs Lab 12/15/15 1125 12/16/15 0220 12/18/15 2200  WBC 7.3 8.2 7.4  HGB 13.7 11.6* 13.7  HCT 39.8 35.5* 41.1  MCV 93.4 94.4 93.0   PLT 275 241 270   Cardiac Enzymes:  Recent Labs Lab 12/15/15 1715 12/15/15 2027 12/16/15 0220  TROPONINI 19.57* 15.68* 18.20*    BNP (last 3 results)  Recent Labs  12/15/15 1715  BNP 137.8*    ProBNP (last 3 results) No results for input(s): PROBNP in the last 8760 hours.  CBG: No results for input(s): GLUCAP in the last 168 hours.  Radiological Exams on Admission: Dg Chest 2 View  12/17/2015  CLINICAL DATA:  Status post previous MI, clinically improved. EXAM: CHEST  2 VIEW COMPARISON:  PA and lateral chest x-ray of September 28, 2008 FINDINGS: The lungs remain hyperinflated. There is minimal blunting of the right lateral and posterior costophrenic angles. There is no alveolar infiltrate. There is no pleural effusion. The heart and pulmonary vascularity are normal. The mediastinum is normal in width. There is mild multilevel degenerative disc disease of the thoracic spine. IMPRESSION: COPD. Blunting of the right lateral and posterior costophrenic angles may be acute or chronic but appears new since 2009. There is no evidence of pulmonary edema or pneumonia. Electronically Signed   By: David  [place] M.D.   On: 12/17/2015 08:38    EKG:  Not done in ED, will obtain as appropriate   Assessment/Plan  1. GI bleed  - Painless, dark red blood in BM x1 - Initial Hgb 13.7, will repeat H/H overnight  - Colonoscopy performed in 2008 notable for diverticulosis involving descending colon  - Pt denies prior bleeding events  - Had been taking ASA 81 daily, now just started [employer], ASA, and pharmacologic VTE ppx for now - Protonix 40 mg IV BID for now  - Had been taking daily omeprazole  - GI consulted from the ED, appreciate their expertise   2. CAD - Pt suffered recent STEMI and had RCA stent placed 12/16/15 by Dr. Katrinka Blazing  - LAD reportedly 60-70% stenosed as well  - Holding ASA and Brilinta in setting of acute GI bleed  - Continue beta-blocker, high-intensity  statin  - May need to adjust antiplatelet regimen or escalate GI protection  - Monitoring on telemetry  - No suggestion of ischemia at the moment  DVT ppx:  SCDs  Code Status: Full code Family Communication:  Yes, patient's son and daughter at bed side Disposition Plan: Admit to inpatient   Date of Service 12/19/2015    Briscoe Deutscher, MD [facility] Pager [phone]  If 7PM-7AM, please contact night-coverage [email or web address] Password [email or web address] 12/19/2015, 5:03 AM

## 2015-12-19 NOTE — ED Provider Notes (Signed)
CSN: [id number]     Arrival date & time 12/18/15  2134 History   By signing my name below, I, Arlan Organ, attest that this documentation has been prepared under the direction and in the presence of Geoffery Lyons, MD.  Electronically Signed: Arlan Organ, ED Scribe. 12/19/2015. 1:45 AM.   Chief Complaint  Patient presents with  . Rectal Bleeding   Patient is a 82 y.o. female presenting with hematochezia. The history is provided by the patient. No language interpreter was used.  Rectal Bleeding Quality:  Bright red Amount:  Scant Duration:  2 hours Timing:  Rare Progression:  Improving Chronicity:  New Context: diarrhea   Context: not constipation   Similar prior episodes: no   Relieved by:  None tried Ineffective treatments:  None tried Associated symptoms: abdominal pain   Associated symptoms: no fever, no recent illness and no vomiting     HPI Comments: Nancy Neal is a 82 y.o. female with a PMHx of CAD who presents to the Emergency Department here after an episode of rectal bleeding onset 1 hour prior to arrival. Pt states she noted a small amount of stool and bright red blood that filled the whole toilet bowl. Pt also reports discomfort to her abdomen described as "i feel like i have to have diarrhea". Pt underwent a cardiac catheterization with coronary stent placement performed 12/15/15 and was started on Brilinta. Pt denies any known hemorrhoids. Most recent colonoscopy 5 years ago.  PCP: Rogelia Boga, MD   GASTROENTEROLOGIST: [facility]  Past Medical History  Diagnosis Date  . ALLERGIC RHINITIS 06/02/2007  . OSTEOPENIA 06/02/2007  . VAGINITIS, ATROPHIC, POSTMENOPAUSAL 12/06/2008  . VERTIGO 02/16/2009  . Glaucoma 1-12  . Coronary artery disease    Past Surgical History  Procedure Laterality Date  . Abdominal hysterectomy    . Eye surgery      for glaucoma  . Cardiac catheterization N/A 12/15/2015    Procedure: Left Heart Cath and Coronary Angiography;   Surgeon: Lyn Records, MD;  Location: [facility] INVASIVE CV LAB;  Service: Cardiovascular;  Laterality: N/A;  . Cardiac catheterization N/A 12/15/2015    Procedure: Coronary Stent Intervention;  Surgeon: Lyn Records, MD;  Location: [facility] INVASIVE CV LAB;  Service: Cardiovascular;  Laterality: N/A;   Family History  Problem Relation Age of Onset  . Suicidality Mother   . Heart disease Father   . Arthritis Brother    Social History  Substance Use Topics  . Smoking status: Never Smoker   . Smokeless tobacco: Never Used  . Alcohol Use: Yes   OB History    No data available     Review of Systems  Constitutional: Negative for fever and chills.  Respiratory: Negative for cough and shortness of breath.   Cardiovascular: Negative for chest pain.  Gastrointestinal: Positive for abdominal pain, diarrhea, hematochezia and anal bleeding. Negative for vomiting.  Musculoskeletal: Negative for back pain.  Neurological: Negative for headaches.  Psychiatric/Behavioral: Negative for confusion.  All other systems reviewed and are negative.     Allergies  Codeine sulfate and Meclizine  Home Medications   Prior to Admission medications   Medication Sig Start Date End Date Taking? Authorizing Provider  acetaminophen (TYLENOL) 325 MG tablet Take 2 tablets (650 mg total) by mouth every 4 (four) hours as needed for headache or mild pain. 12/17/15   Abelino Derrick, PA-C  aspirin 81 MG chewable tablet Chew 1 tablet (81 mg total) by mouth daily. 12/17/15  Eda Paschal Kilroy, PA-C  atorvastatin (LIPITOR) 80 MG tablet Take 1 tablet (80 mg total) by mouth daily at 6 PM. 12/17/15   Abelino Derrick, PA-C  Calcium Carbonate-Vitamin D (CALCIUM-D PO) Take 1 tablet by mouth daily.    Historical Provider, MD  carboxymethylcellulose (REFRESH PLUS) 0.5 % SOLN Place 1 drop into both eyes 3 (three) times daily as needed (dry eyes).     Historical Provider, MD  GuaiFENesin (MUCUS RELIEF ADULT PO) Take 1 tablet by mouth daily.      Historical Provider, MD  ibuprofen (ADVIL,MOTRIN) 200 MG tablet Take 1 tablet (200 mg total) by mouth every 8 (eight) hours as needed for moderate pain. 12/17/15   Abelino Derrick, PA-C  metoprolol tartrate (LOPRESSOR) 25 MG tablet Take 0.5 tablets (12.5 mg total) by mouth 2 (two) times daily. 12/17/15   Abelino Derrick, PA-C  Multiple Vitamin (MULTIVITAMIN WITH MINERALS) TABS tablet Take 1 tablet by mouth daily.    Historical Provider, MD  nitroGLYCERIN (NITROSTAT) 0.4 MG SL tablet Place 1 tablet (0.4 mg total) under the tongue every 5 (five) minutes as needed for chest pain. 12/17/15   Abelino Derrick, PA-C  omeprazole (PRILOSEC OTC) 20 MG tablet Take 20 mg by mouth daily before breakfast.     Historical Provider, MD  OVER THE COUNTER MEDICATION Take 1 tablet by mouth daily. Over the counter antihistamine    Historical Provider, MD  Probiotic Product (PROBIOTIC FORMULA PO) Take 1 tablet by mouth daily.     Historical Provider, MD  ticagrelor (BRILINTA) 90 MG TABS tablet Take 1 tablet (90 mg total) by mouth 2 (two) times daily. 12/17/15   Abelino Derrick, PA-C  VITAMIN E PO Take 1 capsule by mouth daily.    Historical Provider, MD   Triage Vitals: BP 111/72 mmHg  Pulse 74  Temp(Src) 98 F (36.7 C) (Oral)  Resp 20  SpO2 100%   Physical Exam  Constitutional: She is oriented to person, place, and time. She appears well-developed and well-nourished. No distress.  HENT:  Head: Normocephalic and atraumatic.  Eyes: EOM are normal.  Neck: Normal range of motion.  Cardiovascular: Normal rate, regular rhythm and normal heart sounds.   Pulmonary/Chest: Effort normal and breath sounds normal.  Abdominal: Soft. She exhibits no distension. There is no tenderness.  Genitourinary:  Rectal exam is negative for hemorrhoids or other obvious abnormalities. DRE reveals brown stool with small amount of gross blood.  Musculoskeletal: Normal range of motion.  Neurological: She is alert and oriented to person, place, and  time.  Skin: Skin is warm and dry.  Psychiatric: She has a normal mood and affect. Judgment normal.  Nursing note and vitals reviewed.   ED Course  Procedures (including critical care time)  DIAGNOSTIC STUDIES: Oxygen Saturation is 100% on RA, Normal by my interpretation.    COORDINATION OF CARE: 1:35 AM- Will order blood work. Discussed treatment plan with pt at bedside and pt agreed to plan.     Labs Review Labs Reviewed  COMPREHENSIVE METABOLIC PANEL - Abnormal; Notable for the following:    Glucose, Bld 100 (*)    Total Protein 6.2 (*)    Total Bilirubin 1.5 (*)    GFR calc non Af Amer 56 (*)    All other components within normal limits  CBC  POC OCCULT BLOOD, ED  TYPE AND SCREEN    Imaging Review   I have personally reviewed and evaluated these lab results as part of  my medical decision-making.    MDM   Final diagnoses:  None   Patient is an 82 year old female who presents with complaints of rectal bleeding. She recently had a stent placed and was started on Brilinta. She appears hemodynamically stable and hemoglobin is normal. I've discussed this patient with the gastroenterologist on call who is recommending admission. She will be admitted to the hospitalist service for serial hemoglobins and possibly colonoscopy if the bleeding persists.  I personally performed the services described in this documentation, which was scribed in my presence. The recorded information has been reviewed and is accurate.       Geoffery Lyons, MD 12/27/15 [phone]

## 2015-12-19 NOTE — ED Notes (Signed)
hospitalist at the bedside 

## 2015-12-20 ENCOUNTER — Encounter (HOSPITAL_COMMUNITY)
Admission: EM | Disposition: A | Discharge status: Home / Self Care | Payer: Self-pay | Source: Home / Self Care | Attending: Internal Medicine

## 2015-12-20 ENCOUNTER — Encounter (HOSPITAL_COMMUNITY): Payer: Self-pay | Admitting: *Deleted

## 2015-12-20 DIAGNOSIS — K5730 Diverticulosis of large intestine without perforation or abscess without bleeding: Secondary | ICD-10-CM

## 2015-12-20 DIAGNOSIS — K921 Melena: Secondary | ICD-10-CM | POA: Diagnosis not present

## 2015-12-20 DIAGNOSIS — I2510 Atherosclerotic heart disease of native coronary artery without angina pectoris: Secondary | ICD-10-CM | POA: Diagnosis not present

## 2015-12-20 DIAGNOSIS — K5792 Diverticulitis of intestine, part unspecified, without perforation or abscess without bleeding: Secondary | ICD-10-CM | POA: Insufficient documentation

## 2015-12-20 DIAGNOSIS — K5791 Diverticulosis of intestine, part unspecified, without perforation or abscess with bleeding: Secondary | ICD-10-CM | POA: Diagnosis not present

## 2015-12-20 DIAGNOSIS — K625 Hemorrhage of anus and rectum: Principal | ICD-10-CM

## 2015-12-20 DIAGNOSIS — Z9861 Coronary angioplasty status: Secondary | ICD-10-CM | POA: Diagnosis not present

## 2015-12-20 DIAGNOSIS — Z8719 Personal history of other diseases of the digestive system: Secondary | ICD-10-CM | POA: Insufficient documentation

## 2015-12-20 HISTORY — PX: COLONOSCOPY: SHX5424

## 2015-12-20 LAB — CBC
HCT: 39.3 % (ref 36.0–46.0)
Hemoglobin: 13.3 g/dL (ref 12.0–15.0)
MCH: 31.9 pg (ref 26.0–34.0)
MCHC: 33.8 g/dL (ref 30.0–36.0)
MCV: 94.2 fL (ref 78.0–100.0)
PLATELETS: 248 10*3/uL (ref 150–400)
RBC: 4.17 MIL/uL (ref 3.87–5.11)
RDW: 13.5 % (ref 11.5–15.5)
WBC: 4.9 10*3/uL (ref 4.0–10.5)

## 2015-12-20 LAB — GLUCOSE, CAPILLARY: GLUCOSE-CAPILLARY: 92 mg/dL (ref 65–99)

## 2015-12-20 SURGERY — COLONOSCOPY
Anesthesia: Moderate Sedation

## 2015-12-20 MED ORDER — FENTANYL CITRATE (PF) 100 MCG/2ML IJ SOLN
INTRAMUSCULAR | Status: DC | PRN
  Administered 2015-12-20 (×2): 25 ug via INTRAVENOUS

## 2015-12-20 MED ORDER — ASPIRIN 81 MG PO CHEW: 81.0000 mg | CHEWABLE_TABLET | Freq: Every day | ORAL | Status: DC

## 2015-12-20 MED ORDER — TICAGRELOR 90 MG PO TABS: 90.0000 mg | ORAL_TABLET | Freq: Two times a day (BID) | ORAL | Status: DC

## 2015-12-20 MED ORDER — FENTANYL CITRATE (PF) 100 MCG/2ML IJ SOLN
INTRAMUSCULAR | Status: AC
  Filled 2015-12-20: qty 2

## 2015-12-20 MED ORDER — MIDAZOLAM HCL 5 MG/5ML IJ SOLN
INTRAMUSCULAR | Status: DC | PRN
  Administered 2015-12-20 (×2): 2 mg via INTRAVENOUS

## 2015-12-20 MED ORDER — DIPHENHYDRAMINE HCL 50 MG/ML IJ SOLN
INTRAMUSCULAR | Status: AC
  Filled 2015-12-20: qty 1

## 2015-12-20 MED ORDER — SODIUM CHLORIDE 0.9 % IV SOLN: INTRAVENOUS | Status: DC

## 2015-12-20 MED ORDER — MIDAZOLAM HCL 5 MG/ML IJ SOLN
INTRAMUSCULAR | Status: AC
  Filled 2015-12-20: qty 2

## 2015-12-20 NOTE — Progress Notes (Signed)
    Subjective: No CP or SOB.  Blood in stool decreased to zero during bowel prep.    Objective: Vital signs in last 24 hours: Temp:  [97.7 F (36.5 C)-97.8 F (36.6 C)] 97.8 F (36.6 C) (03/02 0544) Pulse Rate:  [64-65] 65 (03/02 0544) Resp:  [16-18] 16 (03/02 0544) BP: (111-149)/(57-72) 111/67 mmHg (03/02 0544) SpO2:  [99 %-100 %] 99 % (03/02 0544) Weight:  [133 lb 12.8 oz (60.691 kg)] 133 lb 12.8 oz (60.691 kg) (03/02 0544)    Intake/Output from previous day: 03/01 0701 - 03/02 0700 In: 450 [P.O.:450] Out: -  Intake/Output this shift:    Medications Scheduled Meds: . atorvastatin  80 mg Oral q1800  . calcium-vitamin D  1 tablet Oral Daily  . clopidogrel  75 mg Oral Daily  . metoprolol tartrate  12.5 mg Oral BID  . multivitamin with minerals  1 tablet Oral Daily  . pantoprazole  40 mg Oral Q0600  . peg 3350 powder  0.5 kit Oral Once  . sodium chloride flush  3 mL Intravenous Q12H   Continuous Infusions:  PRN Meds:.acetaminophen **OR** acetaminophen, nitroGLYCERIN, ondansetron **OR** ondansetron (ZOFRAN) IV, polyvinyl alcohol  PE: Lungs: clear to auscultation bilaterally Heart: regular rate and rhythm, S1, S2 normal, no murmur, click, rub or gallop Extremities: No LEE Pulses: 2+ and symmetric Skin: Skin color, texture, turgor normal. No rashes or lesions Neurologic: Grossly normal  Lab Results:   Recent Labs  12/18/15 2200 12/19/15 0550 12/20/15 0530  WBC 7.4  --  4.9  HGB 13.7 12.4 13.3  HCT 41.1 38.2 39.3  PLT 270  --  248   BMET  Recent Labs  12/18/15 2200  NA 142  K 4.4  CL 105  CO2 26  GLUCOSE 100*  BUN 18  CREATININE 0.93  CALCIUM 10.0    Assessment/Plan     GI bleeding  Colonoscopy today.     CAD S/P PCI 12/15/15  Was switch to plavix.  Hopefully we can resume brilinta.  On lopressor.  BP controlled..    Anticoagulated by anticoagulation treatment   Hematochezia       LOS: 1 day    HAGER, BRYAN PA-C 12/20/2015 7:51  AM  The patient has been seen in conjunction with Tenny Craw, PA-C. All aspects of care have been considered and discussed. The patient has been personally interviewed, examined, and all clinical data has been reviewed.   No findings on GI evaluation suggesting that either hemorrhoids or diverticular disease were the source of minor rectal bleeding.  Resume Brilinta and aspirin. Discontinue Plavix.  Discharge when clinically stable from internal medicine standpoint.

## 2015-12-20 NOTE — Op Note (Signed)
[facility] [address] [place], [address]   COLONOSCOPY PROCEDURE REPORT  PATIENT: Nancy Neal, Nancy Neal  MR#: [id number] BIRTHDATE: 1932-10-09 , 83  yrs. old GENDER: female ENDOSCOPIST: Roxy Cedar, MD REFERRED BZ:[facility] PROCEDURE DATE:  12/20/2015 PROCEDURE:   Colonoscopy, diagnostic  ASA CLASS:   Class III INDICATIONS:rectal bleeding. MEDICATIONS: Fentanyl 50 mcg IV and Versed 4 mg IV  DESCRIPTION OF PROCEDURE:   After the risks benefits and alternatives of the procedure were thoroughly explained, informed consent was obtained.  The digital rectal exam revealed no abnormalities of the rectum, small noninflamed external hemorrhoids.   The Pentax Adult Colon [id number]  endoscope was introduced through the anus and advanced to the cecum, which was identified by both the appendix and ileocecal valve. No adverse events experienced.   The quality of the prep was excellent. (MoviPrep was used)  The instrument was then slowly withdrawn as the colon was fully examined. Estimated blood loss is zero unless otherwise noted in this procedure report.  COLON FINDINGS: There was moderate diverticulosis noted in the sigmoid colon.   The examination was otherwise normal.  Retroflexed views revealed no abnormalities. Small middle hemorrhoids in the anal canal. The time to cecum = 0.0 Withdrawal time = 5.9   The scope was withdrawn and the procedure completed. COMPLICATIONS: There were no immediate complications.  ENDOSCOPIC IMPRESSION: 1.   Moderate diverticulosis was noted in the sigmoid colon. No blood in the colon or bleeding 2.   The examination was otherwise normal 3. Small mental hemorrhoids and small external hemorrhoids. Do not look inflamed, but likely source for minor rectal bleeding  RECOMMENDATIONS: 1.  Resume diet. Recommend fiber supplementation. Okay for discharge home from GI standpoint 2.  Continue current medications , including  aspirin and Brilinta 3. GI follow-up prn. Will sign off. Left voice message with daughter as requested  eSigned:  Roxy Cedar, MD 12/20/2015 10:06 AM   cc: The Patient, Gordy Savers, MD, and Garnette Scheuermann MD

## 2015-12-20 NOTE — Discharge Summary (Signed)
Physician Discharge Summary  Nancy Neal Dandy JKK:[id number] DOB: 10-28-1931 DOA: 12/19/2015  PCP: Rogelia Boga, MD  Admit date: 12/19/2015 Discharge date: 12/20/2015  Time spent: 35 minutes  Recommendations for Outpatient Follow-up:  1. Follow-up cardiology in 2-4 weeks.   Discharge Diagnoses:  Principal Problem:   GI bleeding Active Problems:   CAD S/P PCI 12/15/15   GI bleed   Anticoagulated by anticoagulation treatment   Hematochezia   Diverticulosis of colon without hemorrhage   Discharge Condition: stable  Diet recommendation: regular  Filed Weights   12/19/15 0310 12/20/15 0544 12/20/15 0852  Weight: 62.415 kg (137 lb 9.6 oz) 60.691 kg (133 lb 12.8 oz) 60.328 kg (133 lb)    History of present illness:   82 year old with past medical history of CAD status post stent to the RCA on 12/16/2015 that comes in for one episode of bloody bowel movement.   Hospital Course: Acute lower GI bleed: Her aspirin and brillinta were stopped hemoglobin dropp from 13-12.4, GI was consulted and recommended a colonoscopy that was done that showed no sites of bleeding, they recommended to resume aspirin and Brilliant. Cardiology was consulted who agreed with regimen.  Procedures: Colonoscopy CXR  Consultations:  Cards  GI  Discharge Exam: Filed Vitals:   12/20/15 1051 12/20/15 1330  BP: 117/68 99/56  Pulse: 57 73  Temp: 97.4 F (36.3 C) 97.8 F (36.6 C)  Resp: 16 18    General: A&O x3 Cardiovascular: RRR Respiratory: good air movement CTA B/L  Discharge Instructions   Discharge Instructions    Diet - low sodium heart healthy    Complete by:  As directed      Increase activity slowly    Complete by:  As directed           Current Discharge Medication List    CONTINUE these medications which have NOT CHANGED   Details  acetaminophen (TYLENOL) 325 MG tablet Take 2 tablets (650 mg total) by mouth every 4 (four) hours as needed for headache or mild pain.     aspirin 81 MG chewable tablet Chew 1 tablet (81 mg total) by mouth daily.    atorvastatin (LIPITOR) 80 MG tablet Take 1 tablet (80 mg total) by mouth daily at 6 PM. Qty: 90 tablet, Refills: 3    Calcium Carbonate-Vitamin D (CALCIUM-D PO) Take 1 tablet by mouth daily.    carboxymethylcellulose (REFRESH PLUS) 0.5 % SOLN Place 1 drop into both eyes 3 (three) times daily as needed (dry eyes).     metoprolol tartrate (LOPRESSOR) 25 MG tablet Take 0.5 tablets (12.5 mg total) by mouth 2 (two) times daily. Qty: 90 tablet, Refills: 3    Multiple Vitamin (MULTIVITAMIN WITH MINERALS) TABS tablet Take 1 tablet by mouth daily.    nitroGLYCERIN (NITROSTAT) 0.4 MG SL tablet Place 1 tablet (0.4 mg total) under the tongue every 5 (five) minutes as needed for chest pain. Qty: 25 tablet, Refills: 2    omeprazole (PRILOSEC OTC) 20 MG tablet Take 20 mg by mouth daily before breakfast.     OVER THE COUNTER MEDICATION Take 1 tablet by mouth daily. Over the counter antihistamine    Probiotic Product (PROBIOTIC FORMULA PO) Take 1 tablet by mouth daily.     ticagrelor (BRILINTA) 90 MG TABS tablet Take 1 tablet (90 mg total) by mouth 2 (two) times daily. Qty: 60 tablet, Refills: 11    VITAMIN E PO Take 1 capsule by mouth daily.      STOP taking these  medications     guaiFENesin (MUCINEX) 600 MG 12 hr tablet      ibuprofen (ADVIL,MOTRIN) 200 MG tablet        Allergies  Allergen Reactions  . Codeine Sulfate Nausea And Vomiting  . Meclizine Other (See Comments)    glaucoma   Follow-up Information    Follow up with Rogelia Boga, MD In 2 weeks.   Specialty:  Internal Medicine   Why:  hospital follow up   Contact information:   [address] [place] Flake [facility] [place] [address] [phone]        The results of significant diagnostics from this hospitalization (including imaging, microbiology, ancillary and laboratory) are listed below for reference.    Significant Diagnostic  Studies: Dg Chest 2 View  12/17/2015  CLINICAL DATA:  Status post previous MI, clinically improved. EXAM: CHEST  2 VIEW COMPARISON:  PA and lateral chest x-ray of September 28, 2008 FINDINGS: The lungs remain hyperinflated. There is minimal blunting of the right lateral and posterior costophrenic angles. There is no alveolar infiltrate. There is no pleural effusion. The heart and pulmonary vascularity are normal. The mediastinum is normal in width. There is mild multilevel degenerative disc disease of the thoracic spine. IMPRESSION: COPD. Blunting of the right lateral and posterior costophrenic angles may be acute or chronic but appears new since 2009. There is no evidence of pulmonary edema or pneumonia. Electronically Signed   By: David  [place] M.D.   On: 12/17/2015 08:38    Microbiology: Recent Results (from the past 240 hour(s))  MRSA PCR Screening     Status: None   Collection Time: 12/15/15  3:34 PM  Result Value Ref Range Status   MRSA by PCR NEGATIVE NEGATIVE Final    Comment:        The GeneXpert MRSA Assay (FDA approved for NASAL specimens only), is one component of a comprehensive MRSA colonization surveillance program. It is not intended to diagnose MRSA infection nor to guide or monitor treatment for MRSA infections.      Labs: Basic Metabolic Panel:  Recent Labs Lab 12/15/15 1125 12/16/15 0220 12/18/15 2200  NA 138 140 142  K 4.1 3.6 4.4  CL 104 108 105  CO2 GLUCOSE 158* 115* 100*  BUN CREATININE 0.85 0.78 0.93  CALCIUM 9.6 8.8* 10.0   Liver Function Tests:  Recent Labs Lab 12/15/15 1125 12/18/15 2200  AST 36 35  ALT 19 25  ALKPHOS 64 63  BILITOT 1.4* 1.5*  PROT 6.4* 6.2*  ALBUMIN 4.1 4.0    Recent Labs Lab 12/15/15 1125  LIPASE 25   No results for input(s): AMMONIA in the last 168 hours. CBC:  Recent Labs Lab 12/15/15 1125 12/16/15 0220 12/18/15 2200 12/19/15 0550 12/20/15 0530  WBC 7.3 8.2 7.4  --  4.9  HGB 13.7  11.6* 13.7 12.4 13.3  HCT 39.8 35.5* 41.1 38.2 39.3  MCV 93.4 94.4 93.0  --  94.2  PLT 275 241 270  --  248   Cardiac Enzymes:  Recent Labs Lab 12/15/15 1715 12/15/15 2027 12/16/15 0220  TROPONINI 19.57* 15.68* 18.20*   BNP: BNP (last 3 results)  Recent Labs  12/15/15 1715  BNP 137.8*    ProBNP (last 3 results) No results for input(s): PROBNP in the last 8760 hours.  CBG:  Recent Labs Lab 12/19/15 0720 12/20/15 0739  GLUCAP 96 92   Signed:  Marinda Elk MD.  [facility] 12/20/2015, 1:51 PM

## 2015-12-20 NOTE — Interval H&P Note (Signed)
History and Physical Interval Note:  12/20/2015 9:23 AM  Nancy Neal  has presented today for surgery, with the diagnosis of hematochezia, on Brilinta/ASA  The various methods of treatment have been discussed with the patient and family. After consideration of risks, benefits and other options for treatment, the patient has consented to  Procedure(s): COLONOSCOPY (N/A) as a surgical intervention .  The patient's history has been reviewed, patient examined, no change in status, stable for surgery.  I have reviewed the patient's chart and labs.  Questions were answered to the patient's satisfaction.     [name]

## 2015-12-20 NOTE — Progress Notes (Signed)
Patient and family given discharge instructions, patient discharged home with no further questions

## 2015-12-20 NOTE — H&P (View-Only) (Signed)
[facility] Consult: 9:40 AM 12/19/2015  LOS: 0 days    Referring Provider: Dr Robb Matar  Primary Care Physician:  Rogelia Boga, MD Primary Gastroenterologist:  Dr. Jarold Motto.      Reason for Consultation:  Hematochezia.    HPI: Nancy Neal is a 82 y.o. female.  Hx COPD, osteopenia, open angle glaucoma. Pt says that when she was in her 30s a now retired MD told her she had intestinal bleeding but at time no endoscopies or surgeries performed.  06/2007 Colonoscopy : Diverticulosis.  06/2002 Colonoscopy:  Normal.   Admission 2/25 - 12/17/15 with STEMI  S/p Promus DES to RCA 12/16/15.  Discharged on Brilinta/81 ASA.  Took  Her last dose of Brilinta in PM of 2/28  ~ 8 PM 2/28 she passed small stool and large volume of deep red blood.  Not dizzy or weak.  Came within 30 mins to ED and had 2 more episodes of smaller volume, less bright red, bleeding she describes as disrrhea.  Last episode was several hours ago.  No real abd pain, just intestinal upset and noises that felt like she was having diarrhea. Stable BP and pulse.  Hgb 12.4 this AM, was 13.7 on arrival 2/28, 11.6 on 2/26.   BUN and creatinine normal.   Pt has not GERD sxs, though taking Omeprazole daily.  No NSAIDs.  No ETOH.  No hx scant bleeding PR.  No weight loss or anorexia.  Generally very active.     Past Medical History  Diagnosis Date  . ALLERGIC RHINITIS 06/02/2007  . OSTEOPENIA 06/02/2007  . VAGINITIS, ATROPHIC, POSTMENOPAUSAL 12/06/2008  . VERTIGO 02/16/2009  . Glaucoma 1-12    open angle  . Coronary artery disease 11/2015    STEMI.  Promus DES to RCA.    Marland Kitchen COPD (chronic obstructive pulmonary disease) (HCC)   . Diverticulosis of colon 2003    by 2003 and 2008 colonoscopy.     Past Surgical History  Procedure Laterality Date  .  Abdominal hysterectomy  age 19  . Eye surgery      for glaucoma  . Cardiac catheterization N/A 12/15/2015    Procedure: Left Heart Cath and Coronary Angiography;  Surgeon: Lyn Records, MD;  Location: [facility] INVASIVE CV LAB;  Service: Cardiovascular;  Laterality: N/A;  . Cardiac catheterization N/A 12/15/2015    Procedure: Coronary Stent Intervention;  Surgeon: Lyn Records, MD;  Location: [facility] INVASIVE CV LAB;  Service: Cardiovascular;  Laterality: N/A;  . Rotator cuff repair Right 11/2014    Prior to Admission medications   Medication Sig Start Date End Date Taking? Authorizing Provider  acetaminophen (TYLENOL) 325 MG tablet Take 2 tablets (650 mg total) by mouth every 4 (four) hours as needed for headache or mild pain. 12/17/15  Yes Abelino Derrick, PA-C  aspirin 81 MG chewable tablet Chew 1 tablet (81 mg total) by mouth daily. 12/17/15  Yes Luke K Kilroy, PA-C  atorvastatin (LIPITOR) 80 MG tablet Take 1 tablet (80 mg total) by mouth daily at 6 PM. 12/17/15  Yes  Abelino Derrick, PA-C  Calcium Carbonate-Vitamin D (CALCIUM-D PO) Take 1 tablet by mouth daily.   Yes Historical Provider, MD  carboxymethylcellulose (REFRESH PLUS) 0.5 % SOLN Place 1 drop into both eyes 3 (three) times daily as needed (dry eyes).    Yes Historical Provider, MD  guaiFENesin (MUCINEX) 600 MG 12 hr tablet Take 600 mg by mouth daily.   Yes Historical Provider, MD         metoprolol tartrate (LOPRESSOR) 25 MG tablet Take 0.5 tablets (12.5 mg total) by mouth 2 (two) times daily. 12/17/15  Yes Abelino Derrick, PA-C  Multiple Vitamin (MULTIVITAMIN WITH MINERALS) TABS tablet Take 1 tablet by mouth daily.   Yes Historical Provider, MD  nitroGLYCERIN (NITROSTAT) 0.4 MG SL tablet Place 1 tablet (0.4 mg total) under the tongue every 5 (five) minutes as needed for chest pain. 12/17/15  Yes Luke K Kilroy, PA-C  omeprazole (PRILOSEC OTC) 20 MG tablet Take 20 mg by mouth daily before breakfast.    Yes Historical Provider, MD  OVER THE COUNTER  MEDICATION Take 1 tablet by mouth daily. Over the counter antihistamine   Yes Historical Provider, MD  Probiotic Product (PROBIOTIC FORMULA PO) Take 1 tablet by mouth daily.    Yes Historical Provider, MD  ticagrelor (BRILINTA) 90 MG TABS tablet Take 1 tablet (90 mg total) by mouth 2 (two) times daily. 12/17/15  Yes Luke K Kilroy, PA-C  VITAMIN E PO Take 1 capsule by mouth daily.   Yes Historical Provider, MD    Scheduled Meds: . atorvastatin  80 mg Oral q1800  . calcium-vitamin D  1 tablet Oral Daily  . metoprolol tartrate  12.5 mg Oral BID  . multivitamin with minerals  1 tablet Oral Daily  . pantoprazole  40 mg Oral Q0600  . sodium chloride flush  3 mL Intravenous Q12H   Infusions: . sodium chloride 75 mL/hr at 12/19/15 0550   PRN Meds: acetaminophen **OR** acetaminophen, nitroGLYCERIN, ondansetron **OR** ondansetron (ZOFRAN) IV, polyvinyl alcohol   Allergies as of 12/18/2015 - Review Complete 12/18/2015  Allergen Reaction Noted  . Codeine sulfate Nausea And Vomiting 02/16/2009  . Meclizine Other (See Comments) 12/25/2010    Family History  Problem Relation Age of Onset  . Suicidality Mother   . Heart disease Father   . Arthritis Brother     Social History   Social History  . Marital Status: Widowed    Spouse Name: N/A  . Number of Children: N/A  . Years of Education: N/A   Occupational History  . Not on file.   Social History Main Topics  . Smoking status: Never Smoker   . Smokeless tobacco: Never Used  . Alcohol Use: Yes  . Drug Use: No  . Sexual Activity: Yes    Birth Control/ Protection: Surgical   Other Topics Concern  . Not on file   Social History Narrative    REVIEW OF SYSTEMS: Constitutional:  Per HPI ENT:  No nose bleeds Pulm:  No SOB or cough CV:  No palpitations, no LE edema.  GU:  No hematuria, no frequency GI:  Per HPI.  No dysphagia Heme:  No unusual bleeding from skin or orifices.  No large bruises   Transfusions:  Age 17 at time of  hysterectomy.  Neuro:  No headaches, no peripheral tingling or numbness Derm:  No itching, no rash or sores.  Endocrine:  No sweats or chills.  No polyuria or dysuria Immunization:  Reviewed. Flu, tetanus, pnvx, zoster  up to date.  Travel:  None beyond local counties in last few months.    PHYSICAL EXAM: Vital signs in last 24 hours: Filed Vitals:   12/19/15 0245 12/19/15 0310  BP: 136/72 154/78  Pulse: 68 66  Temp:  97.5 F (36.4 C)  Resp: 16 16   Wt Readings from Last 3 Encounters:  12/19/15 62.415 kg (137 lb 9.6 oz)  12/17/15 65.454 kg (144 lb 4.8 oz)  07/02/15 60.782 kg (134 lb)    General: pleasant, healthy, comfortable looks like she is > 10 yrs younger than 33 Head:  No trauma, asymmetry or swellilng  Eyes:  No icterus or conj pallor Ears:  Not HOH  Nose:  No discharge Mouth:  Clear, moist.  Excellent teeth.  Neck:  No JVD, no TMG or mass Lungs:  Clear bil.  No labored resps or cough Heart: RRR.  No MRG.  S1/S@ audible Abdomen:  Soft, NT, ND.  No mass or HSM.  Active BS.  No bruits or hernia.   Rectal: pasty medium red, blood mixed with stool appearance   Musc/Skeltl: no joint swelling redness or gross deformities Extremities:  No CCE.  Feet warm and well perfused  Neurologic:  Oriented x 3.  Fully alert.  No limb weakness, no tremor Skin:  No sores, rashes or telangectasia. Tattoos:  none Nodes:  No cervical adenopathy   Psych:  Pleasant, calm, appropriate  Intake/Output from previous day:   Intake/Output this shift:    LAB RESULTS:  Recent Labs  12/18/15 2200 12/19/15 0550  WBC 7.4  --   HGB 13.7 12.4  HCT 41.1 38.2  PLT 270  --    BMET Lab Results  Component Value Date   NA 142 12/18/2015   NA 140 12/16/2015   NA 138 12/15/2015   K 4.4 12/18/2015   K 3.6 12/16/2015   K 4.1 12/15/2015   CL 105 12/18/2015   CL 108 12/16/2015   CL 104 12/15/2015   CO2 26 12/18/2015   CO2 23 12/16/2015   CO2 24 12/15/2015   GLUCOSE 100* 12/18/2015    GLUCOSE 115* 12/16/2015   GLUCOSE 158* 12/15/2015   BUN 18 12/18/2015   BUN 11 12/16/2015   BUN 18 12/15/2015   CREATININE 0.93 12/18/2015   CREATININE 0.78 12/16/2015   CREATININE 0.85 12/15/2015   CALCIUM 10.0 12/18/2015   CALCIUM 8.8* 12/16/2015   CALCIUM 9.6 12/15/2015   LFT  Recent Labs  12/18/15 2200  PROT 6.2*  ALBUMIN 4.0  AST 35  ALT 25  ALKPHOS 63  BILITOT 1.5*   PT/INR No results found for: INR, PROTIME Hepatitis Panel No results for input(s): HEPBSAG, HCVAB, HEPAIGM, HEPBIGM in the last 72 hours. C-Diff No components found for: CDIFF Lipase     Component Value Date/Time   LIPASE 25 12/15/2015 1125    Drugs of Abuse  No results found for: LABOPIA, COCAINSCRNUR, LABBENZ, AMPHETMU, THCU, LABBARB   RADIOLOGY STUDIES: No results found.  ENDOSCOPIC STUDIES: Per HPI  IMPRESSION:   *  Hematochezia in pt just started DAP (ASA/Brilinta). Suspect diverticular bleed.  Diverticulosis on colonoscopies in 2003, 2008  *  Acute MI, s/p 12/16/15 Promus DES to RCA.    *  Hyperglycemia, does not carry DM 2 diagnosis.    PLAN:     *  Colonoscopy tomorrow.  If rebleeds: nuc RBC scan.   *  CBC in AM.  Switch to q day oral PPI as at home.  *  Clears.    Jennye Moccasin  12/19/2015, 9:40 AM Pager: [phone]  GI ATTENDING  History, laboratories, x-rays, previous endoscopy reports reviewed. Patient personally seen and examined. Agree with comprehensive consultation note as outlined above. Complex 82 year old female with COPD and coronary artery disease with acute MI and right coronary artery stent placement last week. On Brilinta and aspirin. Now with hemodynamically stable lower GI bleeding. No obvious cause on rectal exam. Last colonoscopy about 9 years ago. May be minor diverticular bleed, internal hemorrhoids, or neoplasia. Needs evaluated is the patient will need to be maintained on Brilinta. We will keep her on Brilinta for the colonoscopy tomorrow. Patient is  high risk.The nature of the procedure, as well as the risks, benefits, and alternatives were carefully and thoroughly reviewed with the patient. Ample time for discussion and questions allowed. The patient understood, was satisfied, and agreed to proceed.  Wilhemina Bonito. Eda Keys., M.D. [facility] Division of Gastroenterology

## 2015-12-26 ENCOUNTER — Encounter: Payer: Self-pay | Admitting: Cardiology

## 2015-12-26 ENCOUNTER — Ambulatory Visit (INDEPENDENT_AMBULATORY_CARE_PROVIDER_SITE_OTHER): Payer: Medicare Other | Admitting: Cardiology

## 2015-12-26 VITALS — BP 128/70 | HR 70 | Ht 65.0 in | Wt 135.0 lb

## 2015-12-26 DIAGNOSIS — I2510 Atherosclerotic heart disease of native coronary artery without angina pectoris: Secondary | ICD-10-CM | POA: Diagnosis not present

## 2015-12-26 DIAGNOSIS — I241 Dressler's syndrome: Secondary | ICD-10-CM

## 2015-12-26 NOTE — Patient Instructions (Signed)
Medication Instructions:  Your physician recommends that you continue on your current medications as directed. Please refer to the Current Medication list given to you today.   Labwork: None ordered  Testing/Procedures: Please keep your stress test appointment  Follow-Up: Follow up as planned with Dr.Smith in April 2017  Any Other Special Instructions Will Be Listed Below (If Applicable).     If you need a refill on your cardiac medications before your next appointment, please call your pharmacy.

## 2015-12-26 NOTE — Progress Notes (Signed)
12/26/2015 Nancy Neal   [birth year]  [id number]  Primary Physician Rogelia Boga, MD Primary Cardiologist: Dr. Katrinka Blazing   Reason for Visit/CC: [facility] F/u for CAD s/p STEMI.   HPI:  82 year old, well appearing female with relatively benign prior history until 12/15/2015 when she developed an acute inferior MI. Drug-eluting stent implantation was performed in the setting of STEMI to a large distribution right coronary artery. Also with a 60-70% lesion in the LAD, treated medically. EF was normal at 55-60%.  She was discharged after 48 hours on dual antiplatelet therapy (aspirin and Brilinta). She presented back to the ED 3 days later with BRBPR following a BM. Her aspirin and brillinta were held and hemoglobin drop from 13-12.4, GI was consulted and recommended a colonoscopy that was done that showed no sites of bleeding, they recommended to resume aspirin and Brilinta. It was felt that perhaps her bleeding was due to hemorrhoids.   She presents to clinic for f/u.  She reports that she has done well. She denies any recurrent anginal symptoms. No exertional chest pain or dyspnea. She has had atypical left-sided chest discomfort that is reproducible with palpation and exacerbated by certain movements. This is consistent with musculoskeletal pain. She denies any recurrent bright red blood per rectum. No melena. She has been fully compliant with her medications. She has been tolerating her Lipitor without side effects.  It should also be noted that Dr. Katrinka Blazing had recommended that she undergo a Lexiscan nuclear stress test to assess for ischemia in the LAD territory given that she was noted to have a 60-70% lesion at time of initial cath.   Current Outpatient Prescriptions  Medication Sig Dispense Refill  . acetaminophen (TYLENOL) 325 MG tablet Take 2 tablets (650 mg total) by mouth every 4 (four) hours as needed for headache or mild pain.    Marland Kitchen aspirin 81 MG chewable tablet Chew 1 tablet  (81 mg total) by mouth daily.    Marland Kitchen atorvastatin (LIPITOR) 80 MG tablet Take 1 tablet (80 mg total) by mouth daily at 6 PM. 90 tablet 3  . Calcium Carbonate-Vitamin D (CALCIUM-D PO) Take 1 tablet by mouth daily.    . carboxymethylcellulose (REFRESH PLUS) 0.5 % SOLN Place 1 drop into both eyes 3 (three) times daily as needed (dry eyes).     . metoprolol tartrate (LOPRESSOR) 25 MG tablet Take 0.5 tablets (12.5 mg total) by mouth 2 (two) times daily. 90 tablet 3  . Multiple Vitamin (MULTIVITAMIN WITH MINERALS) TABS tablet Take 1 tablet by mouth daily.    . nitroGLYCERIN (NITROSTAT) 0.4 MG SL tablet Place 1 tablet (0.4 mg total) under the tongue every 5 (five) minutes as needed for chest pain. 25 tablet 2  . OVER THE COUNTER MEDICATION Take 1 tablet by mouth daily. Over the counter antihistamine    . Probiotic Product (PROBIOTIC FORMULA PO) Take 1 tablet by mouth daily.     . ticagrelor (BRILINTA) 90 MG TABS tablet Take 1 tablet (90 mg total) by mouth 2 (two) times daily. 60 tablet 11  . VITAMIN E PO Take 1 capsule by mouth daily.     No current facility-administered medications for this visit.    Allergies  Allergen Reactions  . Codeine Sulfate Nausea And Vomiting  . Meclizine Other (See Comments)    glaucoma    Social History   Social History  . Marital Status: Widowed    Spouse Name: N/A  . Number of Children: N/A  . Years  of Education: N/A   Occupational History  . Not on file.   Social History Main Topics  . Smoking status: Never Smoker   . Smokeless tobacco: Never Used  . Alcohol Use: 4.2 oz/week    7 Glasses of wine per week  . Drug Use: No  . Sexual Activity: No   Other Topics Concern  . Not on file   Social History Narrative     Review of Systems: General: negative for chills, fever, night sweats or weight changes.  Cardiovascular: negative for chest pain, dyspnea on exertion, edema, orthopnea, palpitations, paroxysmal nocturnal dyspnea or shortness of  breath Dermatological: negative for rash Respiratory: negative for cough or wheezing Urologic: negative for hematuria Abdominal: negative for nausea, vomiting, diarrhea, bright red blood per rectum, melena, or hematemesis Neurologic: negative for visual changes, syncope, or dizziness All other systems reviewed and are otherwise negative except as noted above.    Blood pressure 128/70, pulse 70, height 5\' 5"  (1.651 m), weight 135 lb (61.236 kg).  General appearance: alert, cooperative, no distress and appears younger than age Neck: no carotid bruit and no JVD Lungs: clear to auscultation bilaterally Heart: regular rate and rhythm, S1, S2 normal, no murmur, click, rub or gallop Extremities: no LEE Pulses: 2+ and symmetric Skin: warm and dry Neurologic: Grossly normal  EKG EKG shows NSR with inferior TWIs.   ASSESSMENT AND PLAN:   1. CAD: Status post recent inferior STEMI secondary to RCA occlusion. This was treated with PCI plus drug-eluting stent placement. She was also noted to have a 60-70% lesion in the LAD. This was elected to be treated medically. However it is been recommended by Dr. Katrinka Blazing that she undergo a Lexiscan Myoview to assess for ischemia in this territory. This is scheduled for later this month. Since discharge from the hospital she denies any recurrent anginal symptoms. She has been fully compliant with DAPT therapy with aspirin and Brilinta. She denies any additional abnormal bleeding. She is also on beta blocker therapy as well as statin therapy with high-dose Lipitor. EF is well-preserved at 50-55%.  2. Acute GI bleed: minimal blood loss. Hemoglobin remained stable and she did not require transfusion. She underwent a colonoscopy by GI which revealed no obvious source of bleeding. It was felt that her bleed was likely secondary to hemorrhoids. She has since restarted DAPT therapy with aspirin plus Brilinta and denies any recurrent bright red blood per rectum and no  melena.  PLAN  Keep appt for Lexiscan myoview to assess for LAD territory ischemia. Keep f/u with Dr. Katrinka Blazing.   Robbie Lis PA-C 12/26/2015 4:02 PM

## 2016-01-01 ENCOUNTER — Telehealth (HOSPITAL_COMMUNITY): Payer: Self-pay

## 2016-01-01 NOTE — Telephone Encounter (Signed)
Encounter complete. 

## 2016-01-02 ENCOUNTER — Telehealth (HOSPITAL_COMMUNITY): Payer: Self-pay

## 2016-01-02 NOTE — Telephone Encounter (Signed)
Encounter complete. 

## 2016-01-03 ENCOUNTER — Ambulatory Visit (HOSPITAL_COMMUNITY)
Admission: RE | Admit: 2016-01-03 | Discharge: 2016-01-03 | Disposition: A | Discharge status: Home / Self Care | Payer: Medicare Other | Source: Ambulatory Visit | Attending: Cardiology | Admitting: Cardiology

## 2016-01-03 DIAGNOSIS — I2510 Atherosclerotic heart disease of native coronary artery without angina pectoris: Secondary | ICD-10-CM

## 2016-01-03 DIAGNOSIS — Z8249 Family history of ischemic heart disease and other diseases of the circulatory system: Secondary | ICD-10-CM | POA: Diagnosis not present

## 2016-01-03 DIAGNOSIS — R079 Chest pain, unspecified: Secondary | ICD-10-CM | POA: Diagnosis not present

## 2016-01-03 DIAGNOSIS — R9439 Abnormal result of other cardiovascular function study: Secondary | ICD-10-CM | POA: Diagnosis not present

## 2016-01-03 LAB — MYOCARDIAL PERFUSION IMAGING
LV dias vol: 59 mL (ref 46–106)
LV sys vol: 22 mL
Peak HR: 94 {beats}/min
Rest HR: 68 {beats}/min
SDS: 0
SRS: 3
SSS: 3
TID: 0.98

## 2016-01-03 MED ORDER — TECHNETIUM TC 99M SESTAMIBI GENERIC - CARDIOLITE
30.8000 | Freq: Once | INTRAVENOUS | Status: AC | PRN
Start: 2016-01-03 — End: 2016-01-03
  Administered 2016-01-03: 30.8 via INTRAVENOUS

## 2016-01-03 MED ORDER — REGADENOSON 0.4 MG/5ML IV SOLN
0.4000 mg | Freq: Once | INTRAVENOUS | Status: AC
  Administered 2016-01-03: 0.4 mg via INTRAVENOUS

## 2016-01-03 MED ORDER — TECHNETIUM TC 99M SESTAMIBI GENERIC - CARDIOLITE
10.6000 | Freq: Once | INTRAVENOUS | Status: AC | PRN
  Administered 2016-01-03: 11 via INTRAVENOUS

## 2016-01-09 ENCOUNTER — Telehealth: Payer: Self-pay | Admitting: Interventional Cardiology

## 2016-01-09 NOTE — Telephone Encounter (Signed)
Spoke with pt. Adv here that Dr.Smith signed off on her cardiac ref form this morning. The form will be faxed over shortly

## 2016-01-09 NOTE — Telephone Encounter (Signed)
New message   Pt states that Rehab have sent two faxes and that need approval from Dr. Katrinka Blazing  Pt wants the rn to call her urgently

## 2016-01-10 ENCOUNTER — Encounter: Payer: Medicare Other | Admitting: Internal Medicine

## 2016-01-15 ENCOUNTER — Encounter: Payer: Self-pay | Admitting: Internal Medicine

## 2016-01-15 ENCOUNTER — Ambulatory Visit (INDEPENDENT_AMBULATORY_CARE_PROVIDER_SITE_OTHER): Payer: Medicare Other | Admitting: Internal Medicine

## 2016-01-15 VITALS — BP 120/80 | HR 76 | Temp 97.8°F | Resp 20 | Ht 64.0 in | Wt 132.0 lb

## 2016-01-15 DIAGNOSIS — I2119 ST elevation (STEMI) myocardial infarction involving other coronary artery of inferior wall: Secondary | ICD-10-CM

## 2016-01-15 DIAGNOSIS — I2510 Atherosclerotic heart disease of native coronary artery without angina pectoris: Secondary | ICD-10-CM | POA: Diagnosis not present

## 2016-01-15 DIAGNOSIS — Z9861 Coronary angioplasty status: Secondary | ICD-10-CM | POA: Diagnosis not present

## 2016-01-15 DIAGNOSIS — I241 Dressler's syndrome: Secondary | ICD-10-CM | POA: Diagnosis not present

## 2016-01-15 DIAGNOSIS — Z0000 Encounter for general adult medical examination without abnormal findings: Secondary | ICD-10-CM | POA: Diagnosis not present

## 2016-01-15 NOTE — Patient Instructions (Addendum)
Limit your sodium (Salt) intake    It is important that you exercise regularly, at least 20 minutes 3 to 4 times per week.  If you develop chest pain or shortness of breath seek  medical attention.  Cardiology follow-up as scheduled  Heart-Healthy Eating Plan Many factors influence your heart health, including eating and exercise habits. Heart (coronary) risk increases with abnormal blood fat (lipid) levels. Heart-healthy meal planning includes limiting unhealthy fats, increasing healthy fats, and making other small dietary changes. This includes maintaining a healthy body weight to help keep lipid levels within a normal range. WHAT IS MY PLAN?  Your health care provider recommends that you:  Get no more than _________% of the total calories in your daily diet from fat.  Limit your intake of saturated fat to less than _________% of your total calories each day.  Limit the amount of cholesterol in your diet to less than _________ mg per day. WHAT TYPES OF FAT SHOULD I CHOOSE?  Choose healthy fats more often. Choose monounsaturated and polyunsaturated fats, such as olive oil and canola oil, flaxseeds, walnuts, almonds, and seeds.  Eat more omega-3 fats. Good choices include salmon, mackerel, sardines, tuna, flaxseed oil, and ground flaxseeds. Aim to eat fish at least two times each week.  Limit saturated fats. Saturated fats are primarily found in animal products, such as meats, butter, and cream. Plant sources of saturated fats include palm oil, palm kernel oil, and coconut oil.  Avoid foods with partially hydrogenated oils in them. These contain trans fats. Examples of foods that contain trans fats are stick margarine, some tub margarines, cookies, crackers, and other baked goods. WHAT GENERAL GUIDELINES DO I NEED TO FOLLOW?  Check food labels carefully to identify foods with trans fats or high amounts of saturated fat.  Fill one half of your plate with vegetables and green salads. Eat  4-5 servings of vegetables per day. A serving of vegetables equals 1 cup of raw leafy vegetables,  cup of raw or cooked cut-up vegetables, or  cup of vegetable juice.  Fill one fourth of your plate with whole grains. Look for the word "whole" as the first word in the ingredient list.  Fill one fourth of your plate with lean protein foods.  Eat 4-5 servings of fruit per day. A serving of fruit equals one medium whole fruit,  cup of dried fruit,  cup of fresh, frozen, or canned fruit, or  cup of 100% fruit juice.  Eat more foods that contain soluble fiber. Examples of foods that contain this type of fiber are apples, broccoli, carrots, beans, peas, and barley. Aim to get 20-30 g of fiber per day.  Eat more home-cooked food and less restaurant, buffet, and fast food.  Limit or avoid alcohol.  Limit foods that are high in starch and sugar.  Avoid fried foods.  Cook foods by using methods other than frying. Baking, boiling, grilling, and broiling are all great options. Other fat-reducing suggestions include:  Removing the skin from poultry.  Removing all visible fats from meats.  Skimming the fat off of stews, soups, and gravies before serving them.  Steaming vegetables in water or broth.  Lose weight if you are overweight. Losing just 5-10% of your initial body weight can help your overall health and prevent diseases such as diabetes and heart disease.  Increase your consumption of nuts, legumes, and seeds to 4-5 servings per week. One serving of dried beans or legumes equals  cup after being cooked,  one serving of nuts equals 1 ounces, and one serving of seeds equals  ounce or 1 tablespoon.  You may need to monitor your salt (sodium) intake, especially if you have high blood pressure. Talk with your health care provider or dietitian to get more information about reducing sodium. WHAT FOODS CAN I EAT? Grains Breads, including [place], white, pita, wheat, raisin, rye, oatmeal,  and [place]. Tortillas that are neither fried nor made with lard or trans fat. Low-fat rolls, including hotdog and hamburger buns and English muffins. Biscuits. Muffins. Waffles. Pancakes. Light popcorn. Whole-grain cereals. Flatbread. Melba toast. Pretzels. Breadsticks. Rusks. Low-fat snacks and crackers, including oyster, saltine, matzo, graham, animal, and rye. Rice and pasta, including brown rice and those that are made with whole wheat. Vegetables All vegetables. Fruits All fruits, but limit coconut. Meats and Other Protein Sources Lean, well-trimmed beef, veal, pork, and lamb. Chicken and [place] without skin. All fish and shellfish. Wild duck, rabbit, pheasant, and venison. Egg whites or low-cholesterol egg substitutes. Dried beans, peas, lentils, and tofu.Seeds and most nuts. Dairy Low-fat or nonfat cheeses, including ricotta, string, and mozzarella. Skim or 1% milk that is liquid, powdered, or evaporated. Buttermilk that is made with low-fat milk. Nonfat or low-fat yogurt. Beverages Mineral water. Diet carbonated beverages. Sweets and Desserts Sherbets and fruit ices. Honey, jam, marmalade, jelly, and syrups. Meringues and gelatins. Pure sugar candy, such as hard candy, jelly beans, gumdrops, mints, marshmallows, and small amounts of dark chocolate. [employer]. Eat all sweets and desserts in moderation. Fats and Oils Nonhydrogenated (trans-free) margarines. Vegetable oils, including soybean, sesame, sunflower, olive, peanut, safflower, corn, canola, and cottonseed. Salad dressings or mayonnaise that are made with a vegetable oil. Limit added fats and oils that you use for cooking, baking, salads, and as spreads. Other Cocoa powder. Coffee and tea. All seasonings and condiments. The items listed above may not be a complete list of recommended foods or beverages. Contact your dietitian for more options. WHAT FOODS ARE NOT RECOMMENDED? Grains Breads that are made with saturated or  trans fats, oils, or whole milk. Croissants. Butter rolls. Cheese breads. Sweet rolls. Donuts. Buttered popcorn. Chow mein noodles. High-fat crackers, such as cheese or butter crackers. Meats and Other Protein Sources Fatty meats, such as hotdogs, [place] ribs, sausage, spareribs, bacon, ribeye roast or steak, and mutton. High-fat deli meats, such as salami and bologna. Caviar. Domestic duck and goose. Organ meats, such as kidney, liver, sweetbreads, brains, gizzard, chitterlings, and heart. Dairy Cream, sour cream, cream cheese, and creamed cottage cheese. Whole milk cheeses, including blue (bleu), [address], [place], [place], [address], [place], [address], [place], [place], and [place]. Whole or 2% milk that is liquid, evaporated, or condensed. Whole buttermilk. Cream sauce or high-fat cheese sauce. Yogurt that is made from whole milk. Beverages Regular sodas and drinks with added sugar. Sweets and Desserts Frosting. Pudding. Cookies. Cakes other than angel food cake. Candy that has milk chocolate or white chocolate, hydrogenated fat, butter, coconut, or unknown ingredients. Buttered syrups. Full-fat ice cream or ice cream drinks. Fats and Oils Gravy that has suet, meat fat, or shortening. Cocoa butter, hydrogenated oils, palm oil, coconut oil, palm kernel oil. These can often be found in baked products, candy, fried foods, nondairy creamers, and whipped toppings. Solid fats and shortenings, including bacon fat, salt pork, lard, and butter. Nondairy cream substitutes, such as coffee creamers and sour cream substitutes. Salad dressings that are made of unknown oils, cheese, or sour cream. The items listed above may not  be a complete list of foods and beverages to avoid. Contact your dietitian for more information.   This information is not intended to replace advice given to you by your health care provider. Make sure you discuss any questions you have with your health care provider.   Document Released:  07/15/2008 Document Revised: 10/27/2014 Document Reviewed: 03/30/2014 Elsevier Interactive Patient Education [employer]

## 2016-01-15 NOTE — Progress Notes (Signed)
Pre visit review using our clinic review tool, if applicable. No additional management support is needed unless otherwise documented below in the visit note. 

## 2016-01-15 NOTE — Progress Notes (Signed)
Patient ID: Nancy Neal, female   DOB: 04/06/1932, 82 y.o.   MRN: [id number]  Subjective:    Patient ID: Nancy Neal, female    DOB: 01/27/1932, 82 y.o.   MRN: [id number]  HPI 82  year-old patient who is seen today for a preventive health examination.   She has been seen annually in the fall by gynecology over the years, but is not planning any further preventive examinations. She is scheduled for a mammogram later this spring. She has a history of mild allergic rhinitis atrophic vaginitis and osteopenia. She remains on calcium and vitamin D supplementations. She takes a probiotic for mild IBS but takes no prescription medications. 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 Her last screening colonoscopy was 2009. She has had an ENT evaluation. She is status post right rotator cuff surgery about one year ago  She was hospitalized in February 2017 for ST segment elevated MI and is status post DES to the RCA.  She was noted to have LAD disease and subsequent nuclear stress test was low risk.  She was readmitted to the hospital earlier this month for brief GI bleed.  Antiplatelet therapy held briefly but resumed colonoscopy was unremarkable.  On complaint today is some mild diarrhea   Medicare wellness visit:   1. Risk factors, based on past M,S,F history- No significant cardiovascular risk factors  2. Physical activities: remains quite active goes to a health club 4 times weekly  3. Depression/mood: no history depression or mood disorder  4. Hearing: no hearing deficits  5. ADL's: independent in all aspects of daily living  6. Fall risk: low  7. Home safety: no problems identified  8. Height weight, and visual acuity; height and weight stable no difficulty with visual acuity  9. Counseling: calcium and vitamin D supplementation as well as exercise all encouraged annual mammograms encouraged  10. Lab orders based on risk factors: laboratory profile including TSH will be reviewed   11. Referral :  Followup ophthalmology annually as well as GYN 12. Care plan: continue heart healthy diet regular exercise  13. Cognitive assessment: Alert and oriented with normal affect no cognitive dysfunction  14.  Preventive services will include an annual health assessment with screening lab.  She will continue to have annual eye examinations. Patient was provided with a written and personalized care plan 15.  Provider list includes primary care ophthalmology, orthopedics and radiology , as well as radiology   Current Allergies:  ! DURATUSS AC 12 (PE HCL-DIPHENHYD HCL-DM HBRTAN)   Past Medical History:   Allergic rhinitis  Osteopenia  atrophic vaginitis   Past Surgical History:    right rotator cuff surgery February 2016   Hysterectomy age 72  colonoscopy January 2009   Family History:   father died age 75, complications of cirrhosis, coronary artery disease  mother died of suicide death age 56  One brother history of osteoarthritis  two sisters are well   diabetes, one aunt, status post abdominal aneurysm repair   Social History:  widowed  nonsmoker  two children  Risk Factors:  Tobacco use: never    Past Medical History  Diagnosis Date  . ALLERGIC RHINITIS 06/02/2007  . OSTEOPENIA 06/02/2007  . VAGINITIS, ATROPHIC, POSTMENOPAUSAL 12/06/2008  . VERTIGO 02/16/2009  . Glaucoma 1-12    open angle  . Coronary artery disease 11/2015    STEMI.  Promus DES to RCA.    . Diverticulosis of colon 2003    by  2003 and 2008 colonoscopy.   . Myocardial infarction (HCC) 12/15/2015  . History of blood transfusion 1971    "when I had hysterectomy"  . GERD (gastroesophageal reflux disease)   . Arthritis     "maybe in my right knee" (12/19/2015)    Social History   Social History  . Marital Status: Widowed    Spouse Name: N/A  . Number of Children: N/A  . Years of Education: N/A   Occupational History  . Not on file.   Social History Main Topics  . Smoking status:  Never Smoker   . Smokeless tobacco: Never Used  . Alcohol Use: 4.2 oz/week    7 Glasses of wine per week  . Drug Use: No  . Sexual Activity: No   Other Topics Concern  . Not on file   Social History Narrative    Past Surgical History  Procedure Laterality Date  . Cardiac catheterization N/A 12/15/2015    Procedure: Left Heart Cath and Coronary Angiography;  Surgeon: Lyn Records, MD;  Location: [facility] INVASIVE CV LAB;  Service: Cardiovascular;  Laterality: N/A;  . Cardiac catheterization N/A 12/15/2015    Procedure: Coronary Stent Intervention;  Surgeon: Lyn Records, MD;  Location: [facility] INVASIVE CV LAB;  Service: Cardiovascular;  Laterality: N/A;  . Shoulder arthroscopy w/ rotator cuff repair Right 11/2014  . Appendectomy  1971  . Total abdominal hysterectomy  1971  . Hemorrhoid surgery  ~ 1968  . Eye surgery Bilateral     "laser tx for glaucoma"  . Colonoscopy N/A 12/20/2015    Procedure: COLONOSCOPY;  Surgeon: Hilarie Fredrickson, MD;  Location[facility] ENDOSCOPY;  Service: Endoscopy;  Laterality: N/A;    Family History  Problem Relation Age of Onset  . Suicidality Mother   . Heart disease Father   . Arthritis Brother     Allergies  Allergen Reactions  . Codeine Sulfate Nausea And Vomiting  . Meclizine Other (See Comments)    glaucoma    Current Outpatient Prescriptions on File Prior to Visit  Medication Sig Dispense Refill  . acetaminophen (TYLENOL) 325 MG tablet Take 2 tablets (650 mg total) by mouth every 4 (four) hours as needed for headache or mild pain.    Marland Kitchen aspirin 81 MG chewable tablet Chew 1 tablet (81 mg total) by mouth daily.    Marland Kitchen atorvastatin (LIPITOR) 80 MG tablet Take 1 tablet (80 mg total) by mouth daily at 6 PM. 90 tablet 3  . Calcium Carbonate-Vitamin D (CALCIUM-D PO) Take 1 tablet by mouth daily.    . carboxymethylcellulose (REFRESH PLUS) 0.5 % SOLN Place 1 drop into both eyes 3 (three) times daily as needed (dry eyes).     . metoprolol tartrate (LOPRESSOR) 25 MG  tablet Take 0.5 tablets (12.5 mg total) by mouth 2 (two) times daily. 90 tablet 3  . Multiple Vitamin (MULTIVITAMIN WITH MINERALS) TABS tablet Take 1 tablet by mouth daily.    . nitroGLYCERIN (NITROSTAT) 0.4 MG SL tablet Place 1 tablet (0.4 mg total) under the tongue every 5 (five) minutes as needed for chest pain. 25 tablet 2  . OVER THE COUNTER MEDICATION Take 1 tablet by mouth daily. Over the counter antihistamine    . Probiotic Product (PROBIOTIC FORMULA PO) Take 1 tablet by mouth daily.     . ticagrelor (BRILINTA) 90 MG TABS tablet Take 1 tablet (90 mg total) by mouth 2 (two) times daily. 60 tablet 11  . VITAMIN E PO Take 1 capsule by  mouth daily.     No current facility-administered medications on file prior to visit.    BP 120/80 mmHg  Pulse 76  Temp(Src) 97.8 F (36.6 C) (Oral)  Resp 20  Ht 5\' 4"  (1.626 m)  Wt 132 lb (59.875 kg)  BMI 22.65 kg/m2    Review of Systems  Constitutional: Negative for fever, appetite change, fatigue and unexpected weight change.  HENT: Positive for hearing loss (left ear). Negative for congestion, dental problem, ear pain, mouth sores, nosebleeds, sinus pressure, sore throat, tinnitus, trouble swallowing and voice change.   Eyes: Negative for photophobia, pain, redness and visual disturbance.  Respiratory: Negative for cough, chest tightness and shortness of breath.   Cardiovascular: Negative for chest pain, palpitations and leg swelling.  Gastrointestinal: Negative for nausea, vomiting, abdominal pain, diarrhea, constipation, blood in stool, abdominal distention and rectal pain.  Genitourinary: Negative for dysuria, urgency, frequency, hematuria, flank pain, vaginal bleeding, vaginal discharge, difficulty urinating, genital sores, vaginal pain, menstrual problem and pelvic pain.  Musculoskeletal: Negative for back pain, arthralgias and neck stiffness.  Skin: Negative for rash.  Neurological: Negative for dizziness, syncope, speech difficulty,  weakness, light-headedness, numbness and headaches.  Hematological: Negative for adenopathy. Does not bruise/bleed easily.  Psychiatric/Behavioral: Negative for suicidal ideas, behavioral problems, self-injury, dysphoric mood and agitation. The patient is not nervous/anxious.        Objective:   Physical Exam  Constitutional: She is oriented to person, place, and time. She appears well-developed and well-nourished.  HENT:  Head: Normocephalic and atraumatic.  Right Ear: External ear normal.  Left Ear: External ear normal.  Mouth/Throat: Oropharynx is clear and moist.  Eyes: Conjunctivae and EOM are normal.  Neck: Normal range of motion. Neck supple. No JVD present. No thyromegaly present.  Cardiovascular: Normal rate, regular rhythm, normal heart sounds and intact distal pulses.   No murmur heard. Pulmonary/Chest: Effort normal and breath sounds normal. She has no wheezes. She has no rales.  Abdominal: Soft. Bowel sounds are normal. She exhibits no distension and no mass. There is no tenderness. There is no rebound and no guarding.  Musculoskeletal: Normal range of motion. She exhibits no edema or tenderness.  Neurological: She is alert and oriented to person, place, and time. She has normal reflexes. No cranial nerve deficit. She exhibits normal muscle tone. Coordination normal.  Skin: Skin is warm and dry. No rash noted.  Psychiatric: She has a normal mood and affect. Her behavior is normal.          Assessment & Plan:  Preventive health examination  Osteopenia. Continue  vitamin D supplements and active lifestyle Allergic rhinitis stable Mild glaucoma. Followup ophthalmology Coronary artery disease status post PCI 12/05/15.  Continue aggressive risk factor modification with high-dose atorvastatin  Follow-up cardiology   Return here one year

## 2016-01-16 ENCOUNTER — Other Ambulatory Visit: Payer: Self-pay | Admitting: *Deleted

## 2016-01-17 ENCOUNTER — Other Ambulatory Visit: Payer: Self-pay | Admitting: *Deleted

## 2016-01-17 MED ORDER — TICAGRELOR 90 MG PO TABS: 90.0000 mg | ORAL_TABLET | Freq: Two times a day (BID) | ORAL | Status: DC

## 2016-01-24 ENCOUNTER — Encounter (HOSPITAL_COMMUNITY): Payer: Self-pay

## 2016-01-24 ENCOUNTER — Encounter (HOSPITAL_COMMUNITY)
Admission: RE | Admit: 2016-01-24 | Discharge: 2016-01-24 | Disposition: A | Discharge status: Home / Self Care | Payer: Medicare Other | Source: Ambulatory Visit | Attending: Interventional Cardiology | Admitting: Interventional Cardiology

## 2016-01-24 VITALS — BP 124/66 | HR 72 | Ht 65.0 in | Wt 132.7 lb

## 2016-01-24 DIAGNOSIS — Z955 Presence of coronary angioplasty implant and graft: Secondary | ICD-10-CM | POA: Diagnosis not present

## 2016-01-24 DIAGNOSIS — Z79899 Other long term (current) drug therapy: Secondary | ICD-10-CM | POA: Diagnosis not present

## 2016-01-24 DIAGNOSIS — I213 ST elevation (STEMI) myocardial infarction of unspecified site: Secondary | ICD-10-CM | POA: Diagnosis not present

## 2016-01-24 DIAGNOSIS — I2111 ST elevation (STEMI) myocardial infarction involving right coronary artery: Secondary | ICD-10-CM

## 2016-01-24 DIAGNOSIS — K219 Gastro-esophageal reflux disease without esophagitis: Secondary | ICD-10-CM | POA: Diagnosis not present

## 2016-01-24 DIAGNOSIS — M8580 Other specified disorders of bone density and structure, unspecified site: Secondary | ICD-10-CM | POA: Insufficient documentation

## 2016-01-24 DIAGNOSIS — I2510 Atherosclerotic heart disease of native coronary artery without angina pectoris: Secondary | ICD-10-CM | POA: Insufficient documentation

## 2016-01-24 DIAGNOSIS — Z7982 Long term (current) use of aspirin: Secondary | ICD-10-CM | POA: Diagnosis not present

## 2016-01-24 NOTE — Progress Notes (Signed)
Cardiac Individual Treatment Plan  Patient Details  Name: Nancy Neal MRN: [id number] Date of Birth: [birth year] Referring Provider:  Lyn Records, MD  Initial Encounter Date:       CARDIAC REHAB PHASE II ORIENTATION from 01/24/2016 in [facility] CARDIAC REHAB   Date  01/24/16      Visit Diagnosis: ST elevation (STEMI) myocardial infarction involving right coronary artery [facility])  Status post insertion of drug-eluting stent into right coronary artery for coronary artery disease  Patient's Home Medications on Admission:  Current outpatient prescriptions:  .  acetaminophen (TYLENOL) 325 MG tablet, Take 2 tablets (650 mg total) by mouth every 4 (four) hours as needed for headache or mild pain., Disp: , Rfl:  .  aspirin 81 MG chewable tablet, Chew 1 tablet (81 mg total) by mouth daily., Disp: , Rfl:  .  atorvastatin (LIPITOR) 40 MG tablet, Take 40 mg by mouth daily., Disp: , Rfl:  .  carboxymethylcellulose (REFRESH PLUS) 0.5 % SOLN, Place 1 drop into both eyes 3 (three) times daily as needed (dry eyes). , Disp: , Rfl:  .  metoprolol tartrate (LOPRESSOR) 25 MG tablet, Take 0.5 tablets (12.5 mg total) by mouth 2 (two) times daily., Disp: 90 tablet, Rfl: 3 .  Multiple Vitamin (MULTIVITAMIN WITH MINERALS) TABS tablet, Take 1 tablet by mouth daily., Disp: , Rfl:  .  nitroGLYCERIN (NITROSTAT) 0.4 MG SL tablet, Place 1 tablet (0.4 mg total) under the tongue every 5 (five) minutes as needed for chest pain., Disp: 25 tablet, Rfl: 2 .  OVER THE COUNTER MEDICATION, Take 1 tablet by mouth daily. Over the counter antihistamine, Disp: , Rfl:  .  Probiotic Product (PROBIOTIC FORMULA PO), Take 1 tablet by mouth daily. , Disp: , Rfl:  .  ticagrelor (BRILINTA) 90 MG TABS tablet, Take 1 tablet (90 mg total) by mouth 2 (two) times daily., Disp: 180 tablet, Rfl: 3 .  VITAMIN E PO, Take 1 capsule by mouth once a week. , Disp: , Rfl:   Past Medical History: Past Medical History  Diagnosis Date   . ALLERGIC RHINITIS 06/02/2007  . OSTEOPENIA 06/02/2007  . VAGINITIS, ATROPHIC, POSTMENOPAUSAL 12/06/2008  . VERTIGO 02/16/2009  . Glaucoma 1-12    open angle  . Coronary artery disease 11/2015    STEMI.  Promus DES to RCA.    . Diverticulosis of colon 2003    by 2003 and 2008 colonoscopy.   . Myocardial infarction (HCC) 12/15/2015  . History of blood transfusion 1971    "when I had hysterectomy"  . GERD (gastroesophageal reflux disease)   . Arthritis     "maybe in my right knee" (12/19/2015)    Tobacco Use: History  Smoking status  . Never Smoker   Smokeless tobacco  . Never Used    Labs:     Recent Review Flowsheet Data    Labs for ITP Cardiac and Pulmonary Rehab Latest Ref Rng 12/06/2008 12/12/2009 12/18/2011 01/04/2014 12/15/2015   Cholestrol 0 - 200 mg/dL 213(YQ) 657(Q) 469(G) 295(M) 167   LDLCALC 0 - 99 mg/dL - - - 841(L) 84   LDLDIRECT - 154.9 144.6 123.2 - -   HDL >40 mg/dL 24.4 01.02 72.53 66.44 61   Trlycerides <150 mg/dL 034 742.5 95.6 387.5 643      Capillary Blood Glucose: Lab Results  Component Value Date   GLUCAP 92 12/20/2015   GLUCAP 96 12/19/2015     Exercise Target Goals: Date: 01/24/16  Exercise Program Goal: Individual  exercise prescription set with THRR, safety & activity barriers. Participant demonstrates ability to understand and report RPE using BORG scale, to self-measure pulse accurately, and to acknowledge the importance of the exercise prescription.  Exercise Prescription Goal: Starting with aerobic activity 30 plus minutes a day, 3 days per week for initial exercise prescription. Provide home exercise prescription and guidelines that participant acknowledges understanding prior to discharge.  Activity Barriers & Risk Stratification:     Activity Barriers & Cardiac Risk Stratification - 01/24/16 0854    Activity Barriers & Cardiac Risk Stratification   Activity Barriers Arthritis   Cardiac Risk Stratification High      6 Minute  Walk:     6 Minute Walk      01/24/16 0952       6 Minute Walk   Phase Initial     Distance 1535 feet     Walk Time 6 minutes     # of Rest Breaks 0     MPH 2.91     METS 2.73     RPE 7     VO2 Peak 9.57     Symptoms No     Resting HR 72 bpm     Resting BP 124/66 mmHg     Max Ex. HR 79 bpm     Max Ex. BP 126/64 mmHg     2 Minute Post BP 116/64 mmHg        Initial Exercise Prescription:     Initial Exercise Prescription - 01/24/16 1000    Date of Initial Exercise Prescription   Date 01/24/16   Treadmill   MPH 2   Grade 1   Minutes 10   METs 2.81   Bike   Level 0.6   Minutes 10   METs 2.73   NuStep   Level 2   Minutes 10   METs 2   Prescription Details   Frequency (times per week) 3   Duration Progress to 30 minutes of continuous aerobic without signs/symptoms of physical distress   Intensity   THRR 40-80% of Max Heartrate 86-111   Ratings of Perceived Exertion 11-13   Progression   Progression Continue to progress workloads to maintain intensity without signs/symptoms of physical distress.   Resistance Training   Training Prescription Yes   Weight 2lbs   Reps 10-12      Perform Capillary Blood Glucose checks as needed.  Exercise Prescription Changes:   Exercise Comments:   Discharge Exercise Prescription (Final Exercise Prescription Changes):   Nutrition:  Target Goals: Understanding of nutrition guidelines, daily intake of sodium 1500mg , cholesterol 200mg , calories 30% from fat and 7% or less from saturated fats, daily to have 5 or more servings of fruits and vegetables.  Biometrics:     Pre Biometrics - 01/24/16 0954    Pre Biometrics   Height 5\' 5"  (1.651 m)   Weight 132 lb 11.5 oz (60.2 kg)   Waist Circumference 27 inches   Hip Circumference 36 inches   Waist to Hip Ratio 0.75 %   BMI (Calculated) 22.1   Triceps Skinfold 20 mm   % Body Fat 32.3 %   Grip Strength 25 kg   Flexibility 15 in   Single Leg Stand 26.43 seconds        Nutrition Therapy Plan and Nutrition Goals:   Nutrition Discharge: Nutrition Scores:   Nutrition Goals Re-Evaluation:   Psychosocial: Target Goals: Acknowledge presence or absence of depression, maximize coping skills, provide positive support system. Participant  is able to verbalize types and ability to use techniques and skills needed for reducing stress and depression.  Initial Review & Psychosocial Screening:   Quality of Life Scores:     Quality of Life - 01/24/16 1131    Quality of Life Scores   Health/Function Pre 28.62 %   Socioeconomic Pre 29.58 %   Psych/Spiritual Pre 27.43 %   Family Pre 26.38 %   GLOBAL Pre 28.23 %      PHQ-9:     Recent Review Flowsheet Data    Depression screen [facility] 2/9 01/15/2016 01/08/2015 01/04/2014   Decreased Interest 0 0 0   Down, Depressed, Hopeless 0 0 0   PHQ - 2 Score 0 0 0      Psychosocial Evaluation and Intervention:   Psychosocial Re-Evaluation:   Vocational Rehabilitation: Provide vocational rehab assistance to qualifying candidates.   Vocational Rehab Evaluation & Intervention:     Vocational Rehab - 01/24/16 1229    Initial Vocational Rehab Evaluation & Intervention   Assessment shows need for Vocational Rehabilitation No      Education: Education Goals: Education classes will be provided on a weekly basis, covering required topics. Participant will state understanding/return demonstration of topics presented.  Learning Barriers/Preferences:     Learning Barriers/Preferences - 01/24/16 1013    Learning Barriers/Preferences   Learning Barriers Sight;Hearing  nerve damage left ear   Learning Preferences Written Material;Group Instruction;Individual Instruction      Education Topics: Count Your Pulse:  -Group instruction provided by verbal instruction, demonstration, patient participation and written materials to support subject.  Instructors address importance of being able to find your pulse  and how to count your pulse when at home without a heart monitor.  Patients get hands on experience counting their pulse with staff help and individually.   Heart Attack, Angina, and Risk Factor Modification:  -Group instruction provided by verbal instruction, video, and written materials to support subject.  Instructors address signs and symptoms of angina and heart attacks.    Also discuss risk factors for heart disease and how to make changes to improve heart health risk factors.   Functional Fitness:  -Group instruction provided by verbal instruction, demonstration, patient participation, and written materials to support subject.  Instructors address safety measures for doing things around the house.  Discuss how to get up and down off the floor, how to pick things up properly, how to safely get out of a chair without assistance, and balance training.   Meditation and Mindfulness:  -Group instruction provided by verbal instruction, patient participation, and written materials to support subject.  Instructor addresses importance of mindfulness and meditation practice to help reduce stress and improve awareness.  Instructor also leads participants through a meditation exercise.    Stretching for Flexibility and Mobility:  -Group instruction provided by verbal instruction, patient participation, and written materials to support subject.  Instructors lead participants through series of stretches that are designed to increase flexibility thus improving mobility.  These stretches are additional exercise for major muscle groups that are typically performed during regular warm up and cool down.   Hands Only CPR Anytime:  -Group instruction provided by verbal instruction, video, patient participation and written materials to support subject.  Instructors co-teach with AHA video for hands only CPR.  Participants get hands on experience with mannequins.   Nutrition I class: Heart Healthy Eating:   -Group instruction provided by PowerPoint slides, verbal discussion, and written materials to support subject matter. The instructor  gives an explanation and review of the Therapeutic Lifestyle Changes diet recommendations, which includes a discussion on lipid goals, dietary fat, sodium, fiber, plant stanol/sterol esters, sugar, and the components of a well-balanced, healthy diet.   Nutrition II class: Lifestyle Skills:  -Group instruction provided by PowerPoint slides, verbal discussion, and written materials to support subject matter. The instructor gives an explanation and review of label reading, grocery shopping for heart health, heart healthy recipe modifications, and ways to make healthier choices when eating out.   Diabetes Question & Answer:  -Group instruction provided by PowerPoint slides, verbal discussion, and written materials to support subject matter. The instructor gives an explanation and review of diabetes co-morbidities, pre- and post-prandial blood glucose goals, pre-exercise blood glucose goals, signs, symptoms, and treatment of hypoglycemia and hyperglycemia, and foot care basics.   Diabetes Blitz:  -Group instruction provided by PowerPoint slides, verbal discussion, and written materials to support subject matter. The instructor gives an explanation and review of the physiology behind type 1 and type 2 diabetes, diabetes medications and rational behind using different medications, pre- and post-prandial blood glucose recommendations and Hemoglobin A1c goals, diabetes diet, and exercise including blood glucose guidelines for exercising safely.    Portion Distortion:  -Group instruction provided by PowerPoint slides, verbal discussion, written materials, and food models to support subject matter. The instructor gives an explanation of serving size versus portion size, changes in portions sizes over the last 20 years, and what consists of a serving from each food  group.   Stress Management:  -Group instruction provided by verbal instruction, video, and written materials to support subject matter.  Instructors review role of stress in heart disease and how to cope with stress positively.     Exercising on Your Own:  -Group instruction provided by verbal instruction, power point, and written materials to support subject.  Instructors discuss benefits of exercise, components of exercise, frequency and intensity of exercise, and end points for exercise.  Also discuss use of nitroglycerin and activating EMS.  Review options of places to exercise outside of rehab.  Review guidelines for sex with heart disease.   Cardiac Drugs I:  -Group instruction provided by verbal instruction and written materials to support subject.  Instructor reviews cardiac drug classes: antiplatelets, anticoagulants, beta blockers, and statins.  Instructor discusses reasons, side effects, and lifestyle considerations for each drug class.   Cardiac Drugs II:  -Group instruction provided by verbal instruction and written materials to support subject.  Instructor reviews cardiac drug classes: angiotensin converting enzyme inhibitors (ACE-I), angiotensin II receptor blockers (ARBs), nitrates, and calcium channel blockers.  Instructor discusses reasons, side effects, and lifestyle considerations for each drug class.   Anatomy and Physiology of the Circulatory System:  -Group instruction provided by verbal instruction, video, and written materials to support subject.  Reviews functional anatomy of heart, how it relates to various diagnoses, and what role the heart plays in the overall system.   Knowledge Questionnaire Score:     Knowledge Questionnaire Score - 01/24/16 1127    Knowledge Questionnaire Score   Pre Score 20/24      Core Components/Risk Factors/Patient Goals at Admission:     Personal Goals and Risk Factors at Admission - 01/24/16 0856    Core Components/Risk  Factors/Patient Goals on Admission   Increase Strength and Stamina Yes   Intervention Provide advice, education, support and counseling about physical activity/exercise needs.;Develop an individualized exercise prescription for aerobic and resistive training based on initial evaluation findings, risk  stratification, comorbidities and participant's personal goals.   Expected Outcomes Achievement of increased cardiorespiratory fitness and enhanced flexibility, muscular endurance and strength shown through measurements of functional capacity and personal statement of participant.   Lipids Yes  Pt's cholesterol number are WNL due to her high dose statin.  Will continue to monitor   Expected Outcomes --  Continue to maintain normal cholesterol levels   Stress Yes   Intervention Offer individual and/or small group education and counseling on adjustment to heart disease, stress management and health-related lifestyle change. Teach and support self-help strategies.;Refer participants experiencing significant psychosocial distress to appropriate mental health specialists for further evaluation and treatment. When possible, include family members and significant others in education/counseling sessions.   Expected Outcomes [place] Term: Participant demonstrates changes in health-related behavior, relaxation and other stress management skills, ability to obtain effective social support, and compliance with psychotropic medications if prescribed.;Long Term: Emotional wellbeing is indicated by absence of clinically significant psychosocial distress or social isolation.   Personal Goal Other Yes   Personal Goal "Back to Go", back to gym, learn limits   Intervention Provide exercise prescription for program and at home/gym   Expected Outcomes Adherance to exercise prescription and independent exercise      Core Components/Risk Factors/Patient Goals Review:    Core Components/Risk Factors/Patient Goals at  Discharge (Final Review):    ITP Comments:     ITP Comments      01/24/16 0856           ITP Comments Medical Director-Dr. Armanda Magic, MD          Comments: Patient attended orientation from 0800 to 1000 to review rules and guidelines for program. Completed 6 minute walk test, Intitial ITP, and exercise prescription.  VSS. Telemetry-Normal Sinus Rhythm.  Asymptomatic.

## 2016-01-24 NOTE — Progress Notes (Signed)
Cardiac Rehab Medication Review by a Pharmacist  Does the patient  feel that his/her medications are working for him/her?  yes  Has the patient been experiencing any side effects to the medications prescribed?  yes  Does the patient measure his/her own blood pressure or blood glucose at home?  no   Does the patient have any problems obtaining medications due to transportation or finances?   no  Understanding of regimen: excellent Understanding of indications: excellent Potential of compliance: excellent    Pharmacist comments: Previously on atorvastatin 80mg . Recently reduced by PCP due to possible cause of diarrhea. Patient willing to retry 80mg  dose.    Nancy Neal, PharmD 01/24/2016 8:49 AM

## 2016-01-30 ENCOUNTER — Encounter (HOSPITAL_COMMUNITY)
Admission: RE | Admit: 2016-01-30 | Discharge: 2016-01-30 | Disposition: A | Discharge status: Home / Self Care | Payer: Medicare Other | Source: Ambulatory Visit | Attending: Interventional Cardiology | Admitting: Interventional Cardiology

## 2016-01-30 DIAGNOSIS — Z79899 Other long term (current) drug therapy: Secondary | ICD-10-CM | POA: Diagnosis not present

## 2016-01-30 DIAGNOSIS — Z7982 Long term (current) use of aspirin: Secondary | ICD-10-CM | POA: Diagnosis not present

## 2016-01-30 DIAGNOSIS — M8580 Other specified disorders of bone density and structure, unspecified site: Secondary | ICD-10-CM | POA: Diagnosis not present

## 2016-01-30 DIAGNOSIS — Z955 Presence of coronary angioplasty implant and graft: Secondary | ICD-10-CM

## 2016-01-30 DIAGNOSIS — I2510 Atherosclerotic heart disease of native coronary artery without angina pectoris: Secondary | ICD-10-CM | POA: Diagnosis not present

## 2016-01-30 DIAGNOSIS — I2111 ST elevation (STEMI) myocardial infarction involving right coronary artery: Secondary | ICD-10-CM

## 2016-01-30 DIAGNOSIS — I213 ST elevation (STEMI) myocardial infarction of unspecified site: Secondary | ICD-10-CM | POA: Diagnosis not present

## 2016-01-30 NOTE — Progress Notes (Signed)
Daily Session Note  Patient Details  Name: Nancy Neal MRN: [id number] Date of Birth: 04-Aug-1932 Referring Provider:  Marletta Lor, MD  Encounter Date: 01/30/2016  Check In:     Session Check In - 01/30/16 1007    Check-In   Location [facility]   Staff Present Maurice Small, RN, Levie Heritage, MA, ACSM RCEP, Exercise Physiologist;Maria Whitaker, RN, BSN;Amber Fair, MS, ACSM RCEP, Exercise Physiologist   Supervising physician immediately available to respond to emergencies [facility] immediately available   Physician(s) Dr. Marily Memos   Medication changes reported     No   Fall or balance concerns reported    No   Warm-up and Cool-down Performed as group-led instruction   Resistance Training Performed No   VAD Patient? No   Pain Assessment   Currently in Pain? No/denies      Capillary Blood Glucose: No results found for this or any previous visit (from the past 24 hour(s)).   Goals Met:  Exercise tolerated well  Goals Unmet:  Not Applicable  Comments: Pt started cardiac rehab today.  Pt tolerated light exercise without difficulty. VSS, telemetry-Sinus rhtyhm, asymptomatic.  Medication list reconciled. Pt denies barriers to medicaiton compliance.  PSYCHOSOCIAL ASSESSMENT:  PHQ-0. Pt exhibits positive coping skills, hopeful outlook with supportive family. No psychosocial needs identified at this time, no psychosocial interventions necessary.  [name] did tell me she is not on good terms with her daughter that lives in [place]. [name] lives with her son here in [place]. Emotional support given  Pt enjoys reading and playing bridge.   Pt oriented to exercise equipment and routine.    Understanding verbalized.   Dr. Fransico Him is Medical Director for Cardiac Rehab at [facility].

## 2016-02-01 ENCOUNTER — Encounter (HOSPITAL_COMMUNITY)
Admission: RE | Admit: 2016-02-01 | Discharge: 2016-02-01 | Disposition: A | Discharge status: Home / Self Care | Payer: Medicare Other | Source: Ambulatory Visit | Attending: Interventional Cardiology | Admitting: Interventional Cardiology

## 2016-02-01 DIAGNOSIS — I2510 Atherosclerotic heart disease of native coronary artery without angina pectoris: Secondary | ICD-10-CM | POA: Diagnosis not present

## 2016-02-01 DIAGNOSIS — I213 ST elevation (STEMI) myocardial infarction of unspecified site: Secondary | ICD-10-CM | POA: Diagnosis not present

## 2016-02-01 DIAGNOSIS — Z79899 Other long term (current) drug therapy: Secondary | ICD-10-CM | POA: Diagnosis not present

## 2016-02-01 DIAGNOSIS — Z955 Presence of coronary angioplasty implant and graft: Secondary | ICD-10-CM | POA: Diagnosis not present

## 2016-02-01 DIAGNOSIS — M8580 Other specified disorders of bone density and structure, unspecified site: Secondary | ICD-10-CM | POA: Diagnosis not present

## 2016-02-01 DIAGNOSIS — Z7982 Long term (current) use of aspirin: Secondary | ICD-10-CM | POA: Diagnosis not present

## 2016-02-01 NOTE — Progress Notes (Signed)
Incomplete Session Note  Patient Details  Name: Nancy Neal MRN: [id number] Date of Birth: 04/01/1932 Referring Provider:        CARDIAC REHAB PHASE II ORIENTATION from 01/24/2016 in [facility] CARDIAC [facility]   Referring Provider  Verdis Prime MD      Jerilee Hoh Teigen did not complete her rehab session.  Elsi told me she forgot to take her Brillinta this morning. Mirajane will not exercise today instead she will go home so she can take her Brillinta. Azera plans to return to exercise on Monday.

## 2016-02-01 NOTE — Progress Notes (Signed)
Nancy Neal came back and exercised after she took her Brillinta at home. Patient exercised without difficulty today.

## 2016-02-04 ENCOUNTER — Encounter (HOSPITAL_COMMUNITY)
Admission: RE | Admit: 2016-02-04 | Discharge: 2016-02-04 | Disposition: A | Discharge status: Home / Self Care | Payer: Medicare Other | Source: Ambulatory Visit | Attending: Interventional Cardiology | Admitting: Interventional Cardiology

## 2016-02-04 DIAGNOSIS — Z955 Presence of coronary angioplasty implant and graft: Secondary | ICD-10-CM | POA: Diagnosis not present

## 2016-02-04 DIAGNOSIS — M8580 Other specified disorders of bone density and structure, unspecified site: Secondary | ICD-10-CM | POA: Diagnosis not present

## 2016-02-04 DIAGNOSIS — I2510 Atherosclerotic heart disease of native coronary artery without angina pectoris: Secondary | ICD-10-CM | POA: Diagnosis not present

## 2016-02-04 DIAGNOSIS — Z79899 Other long term (current) drug therapy: Secondary | ICD-10-CM | POA: Diagnosis not present

## 2016-02-04 DIAGNOSIS — I213 ST elevation (STEMI) myocardial infarction of unspecified site: Secondary | ICD-10-CM | POA: Diagnosis not present

## 2016-02-04 DIAGNOSIS — Z7982 Long term (current) use of aspirin: Secondary | ICD-10-CM | POA: Diagnosis not present

## 2016-02-04 DIAGNOSIS — I2111 ST elevation (STEMI) myocardial infarction involving right coronary artery: Secondary | ICD-10-CM

## 2016-02-04 NOTE — Progress Notes (Signed)
Reviewed home exercise with pt today.  Pt plans to continue walking at home for exercise.  She also plans to return back to [facility] once she is able to build up her stamina a little bit more.  Reviewed THR, pulse, RPE, sign and symptoms, NTG use, and when to call 911 or MD.  Also discussed weather considerations and indoor options.  Pt voiced understanding. Fabio Pierce, MA, ACSM RCEP 02/04/2016 10:37 AM

## 2016-02-06 ENCOUNTER — Encounter (HOSPITAL_COMMUNITY)
Admission: RE | Admit: 2016-02-06 | Discharge: 2016-02-06 | Disposition: A | Discharge status: Home / Self Care | Payer: Medicare Other | Source: Ambulatory Visit | Attending: Interventional Cardiology | Admitting: Interventional Cardiology

## 2016-02-06 DIAGNOSIS — I213 ST elevation (STEMI) myocardial infarction of unspecified site: Secondary | ICD-10-CM | POA: Diagnosis not present

## 2016-02-06 DIAGNOSIS — Z955 Presence of coronary angioplasty implant and graft: Secondary | ICD-10-CM | POA: Diagnosis not present

## 2016-02-06 DIAGNOSIS — I2111 ST elevation (STEMI) myocardial infarction involving right coronary artery: Secondary | ICD-10-CM

## 2016-02-06 DIAGNOSIS — Z7982 Long term (current) use of aspirin: Secondary | ICD-10-CM | POA: Diagnosis not present

## 2016-02-06 DIAGNOSIS — I2510 Atherosclerotic heart disease of native coronary artery without angina pectoris: Secondary | ICD-10-CM | POA: Diagnosis not present

## 2016-02-06 DIAGNOSIS — Z79899 Other long term (current) drug therapy: Secondary | ICD-10-CM | POA: Diagnosis not present

## 2016-02-06 DIAGNOSIS — M8580 Other specified disorders of bone density and structure, unspecified site: Secondary | ICD-10-CM | POA: Diagnosis not present

## 2016-02-08 ENCOUNTER — Encounter (HOSPITAL_COMMUNITY)
Admission: RE | Admit: 2016-02-08 | Discharge: 2016-02-08 | Disposition: A | Discharge status: Home / Self Care | Payer: Medicare Other | Source: Ambulatory Visit | Attending: Interventional Cardiology | Admitting: Interventional Cardiology

## 2016-02-08 DIAGNOSIS — Z955 Presence of coronary angioplasty implant and graft: Secondary | ICD-10-CM

## 2016-02-08 DIAGNOSIS — I2510 Atherosclerotic heart disease of native coronary artery without angina pectoris: Secondary | ICD-10-CM | POA: Diagnosis not present

## 2016-02-08 DIAGNOSIS — Z79899 Other long term (current) drug therapy: Secondary | ICD-10-CM | POA: Diagnosis not present

## 2016-02-08 DIAGNOSIS — M8580 Other specified disorders of bone density and structure, unspecified site: Secondary | ICD-10-CM | POA: Diagnosis not present

## 2016-02-08 DIAGNOSIS — I213 ST elevation (STEMI) myocardial infarction of unspecified site: Secondary | ICD-10-CM | POA: Diagnosis not present

## 2016-02-08 DIAGNOSIS — Z7982 Long term (current) use of aspirin: Secondary | ICD-10-CM | POA: Diagnosis not present

## 2016-02-08 DIAGNOSIS — I2111 ST elevation (STEMI) myocardial infarction involving right coronary artery: Secondary | ICD-10-CM

## 2016-02-08 NOTE — Progress Notes (Signed)
Nancy Neal 82 y.o. female Nutrition Note Spoke with pt.  Nutrition Survey reviewed with pt. Pt is following Step 2 of the Therapeutic Lifestyle Changes diet. Per discussion, pt has followed a heart healthy diet "for years." Pt expressed understanding of the information reviewed. Pt aware of nutrition education classes offered. No results found for: HGBA1C Wt Readings from Last 3 Encounters:  01/24/16 132 lb 11.5 oz (60.2 kg)  01/15/16 132 lb (59.875 kg)  01/03/16 135 lb (61.236 kg)   Nutrition Diagnosis ? Food-and nutrition-related knowledge deficit related to lack of exposure to information as related to diagnosis of: ? CVD  Nutrition Intervention ? Benefits of adopting Therapeutic Lifestyle Changes discussed when Medficts reviewed. ? Pt to attend the Portion Distortion class ? Pt given handouts for: ? Nutrition I class ? Nutrition II class ? Continue client-centered nutrition education by RD, as part of interdisciplinary care.  Goal(s) ? Pt to describe the benefit of including fruits, vegetables, whole grains, and low-fat dairy products in a heart healthy meal plan.  Monitor and Evaluate progress toward nutrition goal with team.  Mickle Plumb, M.Ed, RD, LDN, CDE 02/08/2016 10:50 AM

## 2016-02-11 ENCOUNTER — Encounter (HOSPITAL_COMMUNITY)
Admission: RE | Admit: 2016-02-11 | Discharge: 2016-02-11 | Disposition: A | Discharge status: Home / Self Care | Payer: Medicare Other | Source: Ambulatory Visit | Attending: Interventional Cardiology | Admitting: Interventional Cardiology

## 2016-02-11 DIAGNOSIS — I2111 ST elevation (STEMI) myocardial infarction involving right coronary artery: Secondary | ICD-10-CM

## 2016-02-11 DIAGNOSIS — Z955 Presence of coronary angioplasty implant and graft: Secondary | ICD-10-CM | POA: Diagnosis not present

## 2016-02-11 DIAGNOSIS — Z7982 Long term (current) use of aspirin: Secondary | ICD-10-CM | POA: Diagnosis not present

## 2016-02-11 DIAGNOSIS — M8580 Other specified disorders of bone density and structure, unspecified site: Secondary | ICD-10-CM | POA: Diagnosis not present

## 2016-02-11 DIAGNOSIS — I2510 Atherosclerotic heart disease of native coronary artery without angina pectoris: Secondary | ICD-10-CM | POA: Diagnosis not present

## 2016-02-11 DIAGNOSIS — Z79899 Other long term (current) drug therapy: Secondary | ICD-10-CM | POA: Diagnosis not present

## 2016-02-11 DIAGNOSIS — I213 ST elevation (STEMI) myocardial infarction of unspecified site: Secondary | ICD-10-CM | POA: Diagnosis not present

## 2016-02-11 NOTE — Progress Notes (Signed)
Cardiac Individual Treatment Plan  Patient Details  Name: Nancy Neal MRN: [id number] Date of Birth: 11-30-1931 Referring Provider:        CARDIAC REHAB PHASE II ORIENTATION from 01/24/2016 in [facility] CARDIAC REHAB   Referring Provider  Verdis Prime MD      Initial Encounter Date:       CARDIAC REHAB PHASE II ORIENTATION from 01/24/2016 in [facility] CARDIAC REHAB   Date  01/24/16   Referring Provider  Verdis Prime MD      Visit Diagnosis: ST elevation (STEMI) myocardial infarction involving right coronary artery [facility])  Status post insertion of drug-eluting stent into right coronary artery for coronary artery disease  Patient's Home Medications on Admission:  Current outpatient prescriptions:  .  acetaminophen (TYLENOL) 325 MG tablet, Take 2 tablets (650 mg total) by mouth every 4 (four) hours as needed for headache or mild pain., Disp: , Rfl:  .  aspirin 81 MG chewable tablet, Chew 1 tablet (81 mg total) by mouth daily., Disp: , Rfl:  .  atorvastatin (LIPITOR) 40 MG tablet, Take 40 mg by mouth daily., Disp: , Rfl:  .  carboxymethylcellulose (REFRESH PLUS) 0.5 % SOLN, Place 1 drop into both eyes 3 (three) times daily as needed (dry eyes). , Disp: , Rfl:  .  metoprolol tartrate (LOPRESSOR) 25 MG tablet, Take 0.5 tablets (12.5 mg total) by mouth 2 (two) times daily., Disp: 90 tablet, Rfl: 3 .  Multiple Vitamin (MULTIVITAMIN WITH MINERALS) TABS tablet, Take 1 tablet by mouth daily., Disp: , Rfl:  .  nitroGLYCERIN (NITROSTAT) 0.4 MG SL tablet, Place 1 tablet (0.4 mg total) under the tongue every 5 (five) minutes as needed for chest pain., Disp: 25 tablet, Rfl: 2 .  OVER THE COUNTER MEDICATION, Take 1 tablet by mouth daily. Over the counter antihistamine, Disp: , Rfl:  .  Probiotic Product (PROBIOTIC FORMULA PO), Take 1 tablet by mouth daily. Reported on 01/30/2016, Disp: , Rfl:  .  ticagrelor (BRILINTA) 90 MG TABS tablet, Take 1 tablet (90 mg total) by  mouth 2 (two) times daily., Disp: 180 tablet, Rfl: 3 .  VITAMIN E PO, Take 1 capsule by mouth once a week. , Disp: , Rfl:   Past Medical History: Past Medical History  Diagnosis Date  . ALLERGIC RHINITIS 06/02/2007  . OSTEOPENIA 06/02/2007  . VAGINITIS, ATROPHIC, POSTMENOPAUSAL 12/06/2008  . VERTIGO 02/16/2009  . Glaucoma 1-12    open angle  . Coronary artery disease 11/2015    STEMI.  Promus DES to RCA.    . Diverticulosis of colon 2003    by 2003 and 2008 colonoscopy.   . Myocardial infarction (HCC) 12/15/2015  . History of blood transfusion 1971    "when I had hysterectomy"  . GERD (gastroesophageal reflux disease)   . Arthritis     "maybe in my right knee" (12/19/2015)    Tobacco Use: History  Smoking status  . Never Smoker   Smokeless tobacco  . Never Used    Labs: Recent Review Flowsheet Data    Labs for ITP Cardiac and Pulmonary Rehab Latest Ref Rng 12/06/2008 12/12/2009 12/18/2011 01/04/2014 12/15/2015   Cholestrol 0 - 200 mg/dL 409(WJ) 191(Y) 782(N) 562(Z) 167   LDLCALC 0 - 99 mg/dL - - - 308(M) 84   LDLDIRECT - 154.9 144.6 123.2 - -   HDL >40 mg/dL 57.8 46.96 29.52 84.13 61   Trlycerides <150 mg/dL 244 010.2 72.5 366.4 403  Capillary Blood Glucose: Lab Results  Component Value Date   GLUCAP 92 12/20/2015   GLUCAP 96 12/19/2015     Exercise Target Goals:    Exercise Program Goal: Individual exercise prescription set with THRR, safety & activity barriers. Participant demonstrates ability to understand and report RPE using BORG scale, to self-measure pulse accurately, and to acknowledge the importance of the exercise prescription.  Exercise Prescription Goal: Starting with aerobic activity 30 plus minutes a day, 3 days per week for initial exercise prescription. Provide home exercise prescription and guidelines that participant acknowledges understanding prior to discharge.  Activity Barriers & Risk Stratification:     Activity Barriers & Cardiac Risk  Stratification - 01/24/16 0854    Activity Barriers & Cardiac Risk Stratification   Activity Barriers Arthritis   Cardiac Risk Stratification High      6 Minute Walk:     6 Minute Walk      01/24/16 0952       6 Minute Walk   Phase Initial     Distance 1535 feet     Walk Time 6 minutes     # of Rest Breaks 0     MPH 2.91     METS 2.73     RPE 7     VO2 Peak 9.57     Symptoms No     Resting HR 72 bpm     Resting BP 124/66 mmHg     Max Ex. HR 79 bpm     Max Ex. BP 126/64 mmHg     2 Minute Post BP 116/64 mmHg        Initial Exercise Prescription:     Initial Exercise Prescription - 01/31/16 1300    Date of Initial Exercise RX and Referring Provider   Date 01/24/16   Referring Provider Verdis Prime MD   Treadmill   MPH 2   Grade 1   Minutes 10   METs 2.81   Bike   Level 0.6   Minutes 10   METs 2.73   NuStep   Level 2   Minutes 10   METs 2   Prescription Details   Frequency (times per week) 3   Intensity   THRR 40-80% of Max Heartrate 86-111   Ratings of Perceived Exertion 11-13   Progression   Progression Continue to progress workloads to maintain intensity without signs/symptoms of physical distress.   Resistance Training   Training Prescription Yes   Weight 2lbs   Reps 10-12      Perform Capillary Blood Glucose checks as needed.  Exercise Prescription Changes:     Exercise Prescription Changes      02/04/16 1000 02/05/16 1000         Exercise Review   Progression  Yes      Response to Exercise   Blood Pressure (Admit)  102/58 mmHg      Blood Pressure (Exercise)  138/80 mmHg      Blood Pressure (Exit)  100/60 mmHg      Heart Rate (Admit)  74 bpm      Heart Rate (Exercise)  91 bpm      Heart Rate (Exit)  74 bpm      Rating of Perceived Exertion (Exercise)  13      Symptoms  none      Comments Home Exercise Given 02/04/16 Home Exercise Given 02/04/16      Duration  Progress to 30 minutes of continuous aerobic without signs/symptoms of  physical  distress      Intensity  THRR unchanged      Progression   Progression  Continue to progress workloads to maintain intensity without signs/symptoms of physical distress.      Average METs  2.5      Resistance Training   Training Prescription Yes Yes      Weight 2lbs 2lbs      Reps 10-12 10-12      Treadmill   MPH 2 2      Grade 1 1      Minutes 10 10      METs 2.81 2.81      Bike   Level 0.6 1      Minutes 10 10      METs 2.73 2.87      NuStep   Level 2 2      Minutes 10 10      METs 2 1.8      Home Exercise Plan   Plans to continue exercise at [employer] (comment)  [employer] and walking at home [employer] (comment)  [employer] and walking at home      Frequency Add 3 additional days to program exercise sessions. Add 3 additional days to program exercise sessions.         Exercise Comments:     Exercise Comments      02/05/16 1054           Exercise Comments Pt is tolerating exercise well and continues to make progress.  During home exercise review, she mentioned that she works out harder at [employer]  She did state that her walk test was not much effort.  I told her that we would work on increasing her workloads.          Discharge Exercise Prescription (Final Exercise Prescription Changes):     Exercise Prescription Changes - 02/05/16 1000    Exercise Review   Progression Yes   Response to Exercise   Blood Pressure (Admit) 102/58 mmHg   Blood Pressure (Exercise) 138/80 mmHg   Blood Pressure (Exit) 100/60 mmHg   Heart Rate (Admit) 74 bpm   Heart Rate (Exercise) 91 bpm   Heart Rate (Exit) 74 bpm   Rating of Perceived Exertion (Exercise) 13   Symptoms none   Comments Home Exercise Given 02/04/16   Duration Progress to 30 minutes of continuous aerobic without signs/symptoms of physical distress   Intensity THRR unchanged   Progression   Progression Continue to progress workloads to maintain intensity without signs/symptoms of physical distress.    Average METs 2.5   Resistance Training   Training Prescription Yes   Weight 2lbs   Reps 10-12   Treadmill   MPH 2   Grade 1   Minutes 10   METs 2.81   Bike   Level 1   Minutes 10   METs 2.87   NuStep   Level 2   Minutes 10   METs 1.8   Home Exercise Plan   Plans to continue exercise at [employer] (comment)  [employer] and walking at home   Frequency Add 3 additional days to program exercise sessions.      Nutrition:  Target Goals: Understanding of nutrition guidelines, daily intake of sodium 1500mg , cholesterol 200mg , calories 30% from fat and 7% or less from saturated fats, daily to have 5 or more servings of fruits and vegetables.  Biometrics:     Pre Biometrics - 01/24/16 0954    Pre Biometrics  Height 5\' 5"  (1.651 m)   Weight 132 lb 11.5 oz (60.2 kg)   Waist Circumference 27 inches   Hip Circumference 36 inches   Waist to Hip Ratio 0.75 %   BMI (Calculated) 22.1   Triceps Skinfold 20 mm   % Body Fat 32.3 %   Grip Strength 25 kg   Flexibility 15 in   Single Leg Stand 26.43 seconds       Nutrition Therapy Plan and Nutrition Goals:     Nutrition Therapy & Goals - 01/24/16 1420    Nutrition Therapy   Diet Therapeutic Lifestyle Changes   Personal Nutrition Goals   Personal Goal #1 Maintain current wt around 132 lb   Intervention Plan   Intervention Prescribe, educate and counsel regarding individualized specific dietary modifications aiming towards targeted core components such as weight, hypertension, lipid management, diabetes, heart failure and other comorbidities.   Expected Outcomes [place] Term Goal: Understand basic principles of dietary content, such as calories, fat, sodium, cholesterol and nutrients.;Long Term Goal: Adherence to prescribed nutrition plan.      Nutrition Discharge: Nutrition Scores:     Nutrition Assessments - 02/08/16 1052    MEDFICTS Scores   Pre Score 37      Nutrition Goals  Re-Evaluation:   Psychosocial: Target Goals: Acknowledge presence or absence of depression, maximize coping skills, provide positive support system. Participant is able to verbalize types and ability to use techniques and skills needed for reducing stress and depression.  Initial Review & Psychosocial Screening:     Initial Psych Review & Screening - 01/30/16 1635    Initial Review   Current issues with Current Stress Concerns   Comments Current stress concerns related to family dynamics with daughter who lives out of state   Family Dynamics   Good Support System? Yes   Concerns Inappropriate over/under dependence on family/friends  Ms Abernethy is not on good speaking terms with her daughter in [place], lives with son here in [place]   Comments Patient has good family support and friends here in [place].   Barriers   Psychosocial barriers to participate in program The patient should benefit from training in stress management and relaxation.   Screening Interventions   Interventions Encouraged to exercise;Other (comment)   Comments Patient offered to meet with Theda Belfast the hospital chaplain. Ms Lilja refused at this time. Will conitnue to offer emotional support.      Quality of Life Scores:     Quality of Life - 01/24/16 1131    Quality of Life Scores   Health/Function Pre 28.62 %   Socioeconomic Pre 29.58 %   Psych/Spiritual Pre 27.43 %   Family Pre 26.38 %   GLOBAL Pre 28.23 %      PHQ-9:     Recent Review Flowsheet Data    Depression screen [facility] 2/9 01/30/2016 01/15/2016 01/08/2015 01/04/2014   Decreased Interest 0 0 0 0   Down, Depressed, Hopeless 0 0 0 0   PHQ - 2 Score 0 0 0 0      Psychosocial Evaluation and Intervention:   Psychosocial Re-Evaluation:   Vocational Rehabilitation: Provide vocational rehab assistance to qualifying candidates.   Vocational Rehab Evaluation & Intervention:     Vocational Rehab - 01/24/16 1229    Initial Vocational  Rehab Evaluation & Intervention   Assessment shows need for Vocational Rehabilitation No      Education: Education Goals: Education classes will be provided on a weekly basis, covering required topics. Participant  will state understanding/return demonstration of topics presented.  Learning Barriers/Preferences:     Learning Barriers/Preferences - 01/24/16 1013    Learning Barriers/Preferences   Learning Barriers Sight;Hearing  nerve damage left ear   Learning Preferences Written Material;Group Instruction;Individual Instruction      Education Topics: Count Your Pulse:  -Group instruction provided by verbal instruction, demonstration, patient participation and written materials to support subject.  Instructors address importance of being able to find your pulse and how to count your pulse when at home without a heart monitor.  Patients get hands on experience counting their pulse with staff help and individually.   Heart Attack, Angina, and Risk Factor Modification:  -Group instruction provided by verbal instruction, video, and written materials to support subject.  Instructors address signs and symptoms of angina and heart attacks.    Also discuss risk factors for heart disease and how to make changes to improve heart health risk factors.   Functional Fitness:  -Group instruction provided by verbal instruction, demonstration, patient participation, and written materials to support subject.  Instructors address safety measures for doing things around the house.  Discuss how to get up and down off the floor, how to pick things up properly, how to safely get out of a chair without assistance, and balance training.          CARDIAC REHAB PHASE II EXERCISE from 02/08/2016 in [facility] CARDIAC REHAB   Date  02/08/16   Instruction Review Code  2- meets goals/outcomes      Meditation and Mindfulness:  -Group instruction provided by verbal instruction, patient  participation, and written materials to support subject.  Instructor addresses importance of mindfulness and meditation practice to help reduce stress and improve awareness.  Instructor also leads participants through a meditation exercise.    Stretching for Flexibility and Mobility:  -Group instruction provided by verbal instruction, patient participation, and written materials to support subject.  Instructors lead participants through series of stretches that are designed to increase flexibility thus improving mobility.  These stretches are additional exercise for major muscle groups that are typically performed during regular warm up and cool down.   Hands Only CPR Anytime:  -Group instruction provided by verbal instruction, video, patient participation and written materials to support subject.  Instructors co-teach with AHA video for hands only CPR.  Participants get hands on experience with mannequins.   Nutrition I class: Heart Healthy Eating:  -Group instruction provided by PowerPoint slides, verbal discussion, and written materials to support subject matter. The instructor gives an explanation and review of the Therapeutic Lifestyle Changes diet recommendations, which includes a discussion on lipid goals, dietary fat, sodium, fiber, plant stanol/sterol esters, sugar, and the components of a well-balanced, healthy diet.   Nutrition II class: Lifestyle Skills:  -Group instruction provided by PowerPoint slides, verbal discussion, and written materials to support subject matter. The instructor gives an explanation and review of label reading, grocery shopping for heart health, heart healthy recipe modifications, and ways to make healthier choices when eating out.   Diabetes Question & Answer:  -Group instruction provided by PowerPoint slides, verbal discussion, and written materials to support subject matter. The instructor gives an explanation and review of diabetes co-morbidities, pre- and  post-prandial blood glucose goals, pre-exercise blood glucose goals, signs, symptoms, and treatment of hypoglycemia and hyperglycemia, and foot care basics.   Diabetes Blitz:  -Group instruction provided by PowerPoint slides, verbal discussion, and written materials to support subject matter. The instructor gives an  explanation and review of the physiology behind type 1 and type 2 diabetes, diabetes medications and rational behind using different medications, pre- and post-prandial blood glucose recommendations and Hemoglobin A1c goals, diabetes diet, and exercise including blood glucose guidelines for exercising safely.    Portion Distortion:  -Group instruction provided by PowerPoint slides, verbal discussion, written materials, and food models to support subject matter. The instructor gives an explanation of serving size versus portion size, changes in portions sizes over the last 20 years, and what consists of a serving from each food group.   Stress Management:  -Group instruction provided by verbal instruction, video, and written materials to support subject matter.  Instructors review role of stress in heart disease and how to cope with stress positively.        CARDIAC REHAB PHASE II EXERCISE from 02/08/2016 in [facility] CARDIAC REHAB   Date  02/06/16   Instruction Review Code  2- meets goals/outcomes      Exercising on Your Own:  -Group instruction provided by verbal instruction, power point, and written materials to support subject.  Instructors discuss benefits of exercise, components of exercise, frequency and intensity of exercise, and end points for exercise.  Also discuss use of nitroglycerin and activating EMS.  Review options of places to exercise outside of rehab.  Review guidelines for sex with heart disease.   Cardiac Drugs I:  -Group instruction provided by verbal instruction and written materials to support subject.  Instructor reviews cardiac drug  classes: antiplatelets, anticoagulants, beta blockers, and statins.  Instructor discusses reasons, side effects, and lifestyle considerations for each drug class.      CARDIAC REHAB PHASE II EXERCISE from 02/08/2016 in [facility] CARDIAC REHAB   Date  01/30/16   Educator  Pharm D   Instruction Review Code  2- meets goals/outcomes      Cardiac Drugs II:  -Group instruction provided by verbal instruction and written materials to support subject.  Instructor reviews cardiac drug classes: angiotensin converting enzyme inhibitors (ACE-I), angiotensin II receptor blockers (ARBs), nitrates, and calcium channel blockers.  Instructor discusses reasons, side effects, and lifestyle considerations for each drug class.   Anatomy and Physiology of the Circulatory System:  -Group instruction provided by verbal instruction, video, and written materials to support subject.  Reviews functional anatomy of heart, how it relates to various diagnoses, and what role the heart plays in the overall system.   Knowledge Questionnaire Score:     Knowledge Questionnaire Score - 01/24/16 1127    Knowledge Questionnaire Score   Pre Score 20/24      Core Components/Risk Factors/Patient Goals at Admission:     Personal Goals and Risk Factors at Admission - 01/24/16 0856    Core Components/Risk Factors/Patient Goals on Admission   Increase Strength and Stamina Yes   Intervention Provide advice, education, support and counseling about physical activity/exercise needs.;Develop an individualized exercise prescription for aerobic and resistive training based on initial evaluation findings, risk stratification, comorbidities and participant's personal goals.   Expected Outcomes Achievement of increased cardiorespiratory fitness and enhanced flexibility, muscular endurance and strength shown through measurements of functional capacity and personal statement of participant.   Lipids Yes  Pt's cholesterol  number are WNL due to her high dose statin.  Will continue to monitor   Expected Outcomes --  Continue to maintain normal cholesterol levels   Stress Yes   Intervention Offer individual and/or small group education and counseling on adjustment to heart disease,  stress management and health-related lifestyle change. Teach and support self-help strategies.;Refer participants experiencing significant psychosocial distress to appropriate mental health specialists for further evaluation and treatment. When possible, include family members and significant others in education/counseling sessions.   Expected Outcomes [place] Term: Participant demonstrates changes in health-related behavior, relaxation and other stress management skills, ability to obtain effective social support, and compliance with psychotropic medications if prescribed.;Long Term: Emotional wellbeing is indicated by absence of clinically significant psychosocial distress or social isolation.   Personal Goal Other Yes   Personal Goal "Back to Go", back to gym, learn limits   Intervention Provide exercise prescription for program and at home/gym   Expected Outcomes Adherance to exercise prescription and independent exercise      Core Components/Risk Factors/Patient Goals Review:      Goals and Risk Factor Review      02/06/16 1052           Core Components/Risk Factors/Patient Goals Review   Personal Goals Review Increase Strength and Stamina;Stress       Review Pt just added hill back into walking routine after reviewing home ex.  Once she is able to make it up successfully, she will feel like strength and stamina will be improved.  She is attending Stress class today.       Expected Outcomes Able to walk up hill!          Core Components/Risk Factors/Patient Goals at Discharge (Final Review):      Goals and Risk Factor Review - 02/06/16 1052    Core Components/Risk Factors/Patient Goals Review   Personal Goals Review  Increase Strength and Stamina;Stress   Review Pt just added hill back into walking routine after reviewing home ex.  Once she is able to make it up successfully, she will feel like strength and stamina will be improved.  She is attending Stress class today.   Expected Outcomes Able to walk up hill!      ITP Comments:     ITP Comments      01/24/16 0856           ITP Comments Medical Director-Dr. Armanda Magic, MD          Comments: Pt is making expected progress toward personal goals after completing 9 sessions. Recommend continued exercise and life style modification education including  stress management and relaxation techniques to decrease cardiac risk profile.

## 2016-02-13 ENCOUNTER — Encounter (HOSPITAL_COMMUNITY)
Admission: RE | Admit: 2016-02-13 | Discharge: 2016-02-13 | Disposition: A | Discharge status: Home / Self Care | Payer: Medicare Other | Source: Ambulatory Visit | Attending: Interventional Cardiology | Admitting: Interventional Cardiology

## 2016-02-13 DIAGNOSIS — I213 ST elevation (STEMI) myocardial infarction of unspecified site: Secondary | ICD-10-CM | POA: Diagnosis not present

## 2016-02-13 DIAGNOSIS — Z955 Presence of coronary angioplasty implant and graft: Secondary | ICD-10-CM

## 2016-02-13 DIAGNOSIS — Z79899 Other long term (current) drug therapy: Secondary | ICD-10-CM | POA: Diagnosis not present

## 2016-02-13 DIAGNOSIS — Z7982 Long term (current) use of aspirin: Secondary | ICD-10-CM | POA: Diagnosis not present

## 2016-02-13 DIAGNOSIS — I2510 Atherosclerotic heart disease of native coronary artery without angina pectoris: Secondary | ICD-10-CM | POA: Diagnosis not present

## 2016-02-13 DIAGNOSIS — I2111 ST elevation (STEMI) myocardial infarction involving right coronary artery: Secondary | ICD-10-CM

## 2016-02-13 DIAGNOSIS — M8580 Other specified disorders of bone density and structure, unspecified site: Secondary | ICD-10-CM | POA: Diagnosis not present

## 2016-02-15 ENCOUNTER — Encounter: Payer: Self-pay | Admitting: Interventional Cardiology

## 2016-02-15 ENCOUNTER — Ambulatory Visit (INDEPENDENT_AMBULATORY_CARE_PROVIDER_SITE_OTHER): Payer: Medicare Other | Admitting: Interventional Cardiology

## 2016-02-15 ENCOUNTER — Encounter (HOSPITAL_COMMUNITY)
Admission: RE | Admit: 2016-02-15 | Discharge: 2016-02-15 | Disposition: A | Discharge status: Home / Self Care | Payer: Medicare Other | Source: Ambulatory Visit | Attending: Interventional Cardiology | Admitting: Interventional Cardiology

## 2016-02-15 VITALS — BP 116/70 | HR 84 | Ht 65.0 in | Wt 133.8 lb

## 2016-02-15 DIAGNOSIS — I2111 ST elevation (STEMI) myocardial infarction involving right coronary artery: Secondary | ICD-10-CM

## 2016-02-15 DIAGNOSIS — Z79899 Other long term (current) drug therapy: Secondary | ICD-10-CM | POA: Diagnosis not present

## 2016-02-15 DIAGNOSIS — Z955 Presence of coronary angioplasty implant and graft: Secondary | ICD-10-CM

## 2016-02-15 DIAGNOSIS — I213 ST elevation (STEMI) myocardial infarction of unspecified site: Secondary | ICD-10-CM | POA: Diagnosis not present

## 2016-02-15 DIAGNOSIS — I2510 Atherosclerotic heart disease of native coronary artery without angina pectoris: Secondary | ICD-10-CM

## 2016-02-15 DIAGNOSIS — I241 Dressler's syndrome: Secondary | ICD-10-CM

## 2016-02-15 DIAGNOSIS — Z7982 Long term (current) use of aspirin: Secondary | ICD-10-CM | POA: Diagnosis not present

## 2016-02-15 DIAGNOSIS — M8580 Other specified disorders of bone density and structure, unspecified site: Secondary | ICD-10-CM | POA: Diagnosis not present

## 2016-02-15 DIAGNOSIS — K625 Hemorrhage of anus and rectum: Secondary | ICD-10-CM | POA: Diagnosis not present

## 2016-02-15 LAB — HEPATIC FUNCTION PANEL
ALBUMIN: 4.2 g/dL (ref 3.6–5.1)
ALT: 28 U/L (ref 6–29)
AST: 25 U/L (ref 10–35)
Alkaline Phosphatase: 88 U/L (ref 33–130)
BILIRUBIN TOTAL: 1.2 mg/dL (ref 0.2–1.2)
Bilirubin, Direct: 0.3 mg/dL — ABNORMAL HIGH (ref <=0.2)
Indirect Bilirubin: 0.9 mg/dL (ref 0.2–1.2)
TOTAL PROTEIN: 6.2 g/dL (ref 6.1–8.1)

## 2016-02-15 LAB — LIPID PANEL
CHOL/HDL RATIO: 2.1 ratio (ref <=5.0)
CHOLESTEROL: 118 mg/dL — AB (ref 125–200)
HDL: 55 mg/dL (ref >=46)
LDL CALC: 35 mg/dL (ref <130)
Triglycerides: 140 mg/dL (ref <150)
VLDL: 28 mg/dL (ref <30)

## 2016-02-15 NOTE — Progress Notes (Signed)
Cardiology Office Note   Date:  02/15/2016   ID:  Nancy Neal, DOB 1932-03-06, MRN [id number]  PCP:  Rogelia Boga, MD  Cardiologist:  [name], MD   Chief Complaint  Patient presents with  . Coronary Artery Disease      History of Present Illness: Nancy Neal is a 82 y.o. female who presents for acute inferior MI. Drug-eluting stent implantation was performed in the setting of STEMI to a large distribution right coronary artery. Also with a 60-70% lesion in the LAD, treated medically. EF was normal at 55-60%. She was discharged after 48 hours on dual antiplatelet therapy (aspirin and Brilinta). After initial discharge she represented with bright red blood per rectum that was felt to be related to internal hemorrhoids. Aspirin and Brilinta has been continued without difficulty.  Nancy Neal is doing well. She is still having a hard time believing that she had myocardial infarction. She continues to reiterate that no one in her family has had clinical coronary disease or a heart attack. She has been having atypical left parasternal discomfort. The discomfort is precipitated by palpation and certain movements. The discomfort is totally dissimilar to her ischemic pain. She is sleeping well. No medication side effects other than 80 mg of Lipitor caused diarrhea. She has decreased the dose to 40 mg per day by splitting the tablet, and the diarrhea has resolved. She did test this hypothesis and went back to 80 mg, and the diarrhea recurred. She has not needed nitroglycerin. She has enjoyed a phase II cardiac rehabilitation program    Past Medical History  Diagnosis Date  . ALLERGIC RHINITIS 06/02/2007  . OSTEOPENIA 06/02/2007  . VAGINITIS, ATROPHIC, POSTMENOPAUSAL 12/06/2008  . VERTIGO 02/16/2009  . Glaucoma 1-12    open angle  . Coronary artery disease 11/2015    STEMI.  Promus DES to RCA.    . Diverticulosis of colon 2003    by 2003 and 2008 colonoscopy.   . Myocardial  infarction (HCC) 12/15/2015  . History of blood transfusion 1971    "when I had hysterectomy"  . GERD (gastroesophageal reflux disease)   . Arthritis     "maybe in my right knee" (12/19/2015)    Past Surgical History  Procedure Laterality Date  . Cardiac catheterization N/A 12/15/2015    Procedure: Left Heart Cath and Coronary Angiography;  Surgeon: Lyn Records, MD;  Location: [facility] INVASIVE CV LAB;  Service: Cardiovascular;  Laterality: N/A;  . Cardiac catheterization N/A 12/15/2015    Procedure: Coronary Stent Intervention;  Surgeon: Lyn Records, MD;  Location: [facility] INVASIVE CV LAB;  Service: Cardiovascular;  Laterality: N/A;  . Shoulder arthroscopy w/ rotator cuff repair Right 11/2014  . Appendectomy  1971  . Total abdominal hysterectomy  1971  . Hemorrhoid surgery  ~ 1968  . Eye surgery Bilateral     "laser tx for glaucoma"  . Colonoscopy N/A 12/20/2015    Procedure: COLONOSCOPY;  Surgeon: Hilarie Fredrickson, MD;  Location: [facility] ENDOSCOPY;  Service: Endoscopy;  Laterality: N/A;     Current Outpatient Prescriptions  Medication Sig Dispense Refill  . acetaminophen (TYLENOL) 325 MG tablet Take 2 tablets (650 mg total) by mouth every 4 (four) hours as needed for headache or mild pain.    Marland Kitchen aspirin 81 MG chewable tablet Chew 1 tablet (81 mg total) by mouth daily.    Marland Kitchen atorvastatin (LIPITOR) 40 MG tablet Take 40 mg by mouth daily.    . carboxymethylcellulose (REFRESH  PLUS) 0.5 % SOLN Place 1 drop into both eyes 3 (three) times daily as needed (dry eyes).     . metoprolol tartrate (LOPRESSOR) 25 MG tablet Take 0.5 tablets (12.5 mg total) by mouth 2 (two) times daily. 90 tablet 3  . Multiple Vitamin (MULTIVITAMIN WITH MINERALS) TABS tablet Take 1 tablet by mouth daily.    . nitroGLYCERIN (NITROSTAT) 0.4 MG SL tablet Place 1 tablet (0.4 mg total) under the tongue every 5 (five) minutes as needed for chest pain. 25 tablet 2  . OVER THE COUNTER MEDICATION Take 1 tablet by mouth daily. Over the counter  antihistamine    . Probiotic Product (PROBIOTIC FORMULA PO) Take 1 tablet by mouth daily. Reported on 01/30/2016    . ticagrelor (BRILINTA) 90 MG TABS tablet Take 1 tablet (90 mg total) by mouth 2 (two) times daily. 180 tablet 3  . VITAMIN E PO Take 1 capsule by mouth once a week.      No current facility-administered medications for this visit.    Allergies:   Codeine sulfate and Meclizine    Social History:  The patient  reports that she has never smoked. She has never used smokeless tobacco. She reports that she drinks about 4.2 oz of alcohol per week. She reports that she does not use illicit drugs.   Family History:  The patient's family history includes Arthritis in her brother; Heart disease in her father; Suicidality in her mother.    ROS:  Please see the history of present illness.   Otherwise, review of systems are positive for Chills, musculoskeletal chest pain, easy bruising, excessive sweating and diarrhea related to Lipitor as noted above..   All other systems are reviewed and negative.    PHYSICAL EXAM: VS:  BP 116/70 mmHg  Pulse 84  Ht 5\' 5"  (1.651 m)  Wt 133 lb 12.8 oz (60.691 kg)  BMI 22.27 kg/m2 , BMI Body mass index is 22.27 kg/(m^2). GEN: Well nourished, well developed, in no acute distress HEENT: normal Neck: no JVD, carotid bruits, or masses Cardiac: RRR.  There is no murmur, rub, or gallop. There is no edema. Respiratory:  clear to auscultation bilaterally, normal work of breathing. GI: soft, nontender, nondistended, + BS MS: no deformity or atrophy Skin: warm and dry, no rash Neuro:  Strength and sensation are intact Psych: euthymic mood, full affect   EKG:  EKG is not ordered today.    Recent Labs: 12/15/2015: B Natriuretic Peptide 137.8*; TSH 0.466 12/18/2015: ALT 25; BUN 18; Creatinine, Ser 0.93; Potassium 4.4; Sodium 142 12/20/2015: Hemoglobin 13.3; Platelets 248    Lipid Panel    Component Value Date/Time   CHOL 167 12/15/2015 1715   TRIG  112 12/15/2015 1715   HDL 61 12/15/2015 1715   CHOLHDL 2.7 12/15/2015 1715   VLDL 22 12/15/2015 1715   LDLCALC 84 12/15/2015 1715   LDLDIRECT 123.2 12/18/2011 0954      Wt Readings from Last 3 Encounters:  02/15/16 133 lb 12.8 oz (60.691 kg)  01/24/16 132 lb 11.5 oz (60.2 kg)  01/15/16 132 lb (59.875 kg)      Other studies Reviewed: Additional studies/ records that were reviewed today include: We reviewed the findings of her cardiac catheterization, the treatment site, and the intermediate stenosis in the LAD.Marland Kitchen The findings include a post discharge myocardial perfusion study did not demonstrate any evidence of ischemia and was interpreted as low risk..    ASSESSMENT AND PLAN:  1. CAD in native artery The  patient has two-vessel coronary disease with recent right coronary stenting doing acute coronary syndrome. Intermediate stenosis in the LAD proximal segment does not produce ischemia on a myocardial perfusion study.  2. Bright red blood per rectum Internal hemorrhoids were documented as the source  3. Hyperlipidemia 80 mg of Lipitor caused diarrhea. She is now down to 40 mg per day and has no GI complaints.    Current medicines are reviewed at length with the patient today.  The patient has the following concerns regarding medicines: None.  The following changes/actions have been instituted:    Lipid panel and liver panel today  Continue phase II cardiac rehabilitation  Clinical follow-up in 3-6 months  Labs/ tests ordered today include:   Orders Placed This Encounter  Procedures  . Hepatic function panel  . Lipid Profile     Disposition:   FU with HS in 6 months  Signed, [name], MD  02/15/2016 4:58 PM    [facility] [address] [place], [place], [place]  [address] Phone: [phone]; Fax: [phone]

## 2016-02-15 NOTE — Patient Instructions (Signed)
Medication Instructions:   START TAKING LIPITOR 40 MG ONCE A DAY   If you need a refill on your cardiac medications before your next appointment, please call your pharmacy.  Labwork:  LIVER AND LIPID PANEL TODAY    Testing/Procedures:  NONE ORDER TODAY    Follow-Up: IN 3 MONTHS WITH DR Katrinka Blazing    Any Other Special Instructions Will Be Listed Below (If Applicable).

## 2016-02-18 ENCOUNTER — Encounter (HOSPITAL_COMMUNITY)
Admission: RE | Admit: 2016-02-18 | Discharge: 2016-02-18 | Disposition: A | Discharge status: Home / Self Care | Payer: Medicare Other | Source: Ambulatory Visit | Attending: Interventional Cardiology | Admitting: Interventional Cardiology

## 2016-02-18 DIAGNOSIS — Z955 Presence of coronary angioplasty implant and graft: Secondary | ICD-10-CM

## 2016-02-18 DIAGNOSIS — I2111 ST elevation (STEMI) myocardial infarction involving right coronary artery: Secondary | ICD-10-CM

## 2016-02-18 DIAGNOSIS — Z7982 Long term (current) use of aspirin: Secondary | ICD-10-CM | POA: Diagnosis not present

## 2016-02-18 DIAGNOSIS — M8580 Other specified disorders of bone density and structure, unspecified site: Secondary | ICD-10-CM | POA: Diagnosis not present

## 2016-02-18 DIAGNOSIS — K219 Gastro-esophageal reflux disease without esophagitis: Secondary | ICD-10-CM | POA: Insufficient documentation

## 2016-02-18 DIAGNOSIS — Z79899 Other long term (current) drug therapy: Secondary | ICD-10-CM | POA: Diagnosis not present

## 2016-02-18 DIAGNOSIS — I213 ST elevation (STEMI) myocardial infarction of unspecified site: Secondary | ICD-10-CM | POA: Diagnosis not present

## 2016-02-18 DIAGNOSIS — I2510 Atherosclerotic heart disease of native coronary artery without angina pectoris: Secondary | ICD-10-CM | POA: Insufficient documentation

## 2016-02-20 ENCOUNTER — Encounter (HOSPITAL_COMMUNITY)
Admission: RE | Admit: 2016-02-20 | Discharge: 2016-02-20 | Disposition: A | Discharge status: Home / Self Care | Payer: Medicare Other | Source: Ambulatory Visit | Attending: Interventional Cardiology | Admitting: Interventional Cardiology

## 2016-02-20 DIAGNOSIS — M8580 Other specified disorders of bone density and structure, unspecified site: Secondary | ICD-10-CM | POA: Diagnosis not present

## 2016-02-20 DIAGNOSIS — I2111 ST elevation (STEMI) myocardial infarction involving right coronary artery: Secondary | ICD-10-CM

## 2016-02-20 DIAGNOSIS — Z955 Presence of coronary angioplasty implant and graft: Secondary | ICD-10-CM | POA: Diagnosis not present

## 2016-02-20 DIAGNOSIS — I2510 Atherosclerotic heart disease of native coronary artery without angina pectoris: Secondary | ICD-10-CM | POA: Diagnosis not present

## 2016-02-20 DIAGNOSIS — Z79899 Other long term (current) drug therapy: Secondary | ICD-10-CM | POA: Diagnosis not present

## 2016-02-20 DIAGNOSIS — I213 ST elevation (STEMI) myocardial infarction of unspecified site: Secondary | ICD-10-CM | POA: Diagnosis not present

## 2016-02-20 DIAGNOSIS — Z7982 Long term (current) use of aspirin: Secondary | ICD-10-CM | POA: Diagnosis not present

## 2016-02-22 ENCOUNTER — Encounter (HOSPITAL_COMMUNITY)
Admission: RE | Admit: 2016-02-22 | Discharge: 2016-02-22 | Disposition: A | Discharge status: Home / Self Care | Payer: Medicare Other | Source: Ambulatory Visit | Attending: Interventional Cardiology | Admitting: Interventional Cardiology

## 2016-02-22 DIAGNOSIS — Z79899 Other long term (current) drug therapy: Secondary | ICD-10-CM | POA: Diagnosis not present

## 2016-02-22 DIAGNOSIS — Z7982 Long term (current) use of aspirin: Secondary | ICD-10-CM | POA: Diagnosis not present

## 2016-02-22 DIAGNOSIS — M8580 Other specified disorders of bone density and structure, unspecified site: Secondary | ICD-10-CM | POA: Diagnosis not present

## 2016-02-22 DIAGNOSIS — I2510 Atherosclerotic heart disease of native coronary artery without angina pectoris: Secondary | ICD-10-CM | POA: Diagnosis not present

## 2016-02-22 DIAGNOSIS — I213 ST elevation (STEMI) myocardial infarction of unspecified site: Secondary | ICD-10-CM | POA: Diagnosis not present

## 2016-02-22 DIAGNOSIS — Z955 Presence of coronary angioplasty implant and graft: Secondary | ICD-10-CM

## 2016-02-22 DIAGNOSIS — I2111 ST elevation (STEMI) myocardial infarction involving right coronary artery: Secondary | ICD-10-CM

## 2016-02-25 ENCOUNTER — Encounter (HOSPITAL_COMMUNITY)
Admission: RE | Admit: 2016-02-25 | Discharge: 2016-02-25 | Disposition: A | Discharge status: Home / Self Care | Payer: Medicare Other | Source: Ambulatory Visit | Attending: Interventional Cardiology | Admitting: Interventional Cardiology

## 2016-02-25 ENCOUNTER — Other Ambulatory Visit: Payer: Self-pay | Admitting: Physician Assistant

## 2016-02-25 DIAGNOSIS — Z79899 Other long term (current) drug therapy: Secondary | ICD-10-CM | POA: Diagnosis not present

## 2016-02-25 DIAGNOSIS — I2510 Atherosclerotic heart disease of native coronary artery without angina pectoris: Secondary | ICD-10-CM | POA: Diagnosis not present

## 2016-02-25 DIAGNOSIS — I213 ST elevation (STEMI) myocardial infarction of unspecified site: Secondary | ICD-10-CM | POA: Diagnosis not present

## 2016-02-25 DIAGNOSIS — Z955 Presence of coronary angioplasty implant and graft: Secondary | ICD-10-CM

## 2016-02-25 DIAGNOSIS — M8580 Other specified disorders of bone density and structure, unspecified site: Secondary | ICD-10-CM | POA: Diagnosis not present

## 2016-02-25 DIAGNOSIS — Z7982 Long term (current) use of aspirin: Secondary | ICD-10-CM | POA: Diagnosis not present

## 2016-02-25 DIAGNOSIS — I2111 ST elevation (STEMI) myocardial infarction involving right coronary artery: Secondary | ICD-10-CM

## 2016-02-25 NOTE — Progress Notes (Signed)
Carlean Jews [facility] notified. No new orders received.

## 2016-02-25 NOTE — Progress Notes (Signed)
Nancy Neal reported that she took a sublingual nitroglycerin yesterday morning because she had a pain in her left arm yesterday morning. [name] says she was not sure it was anginal pain but she took it anyway. [name] never experienced any actual chest pain. Vital signs stable this morning. ECG tracing shows Sinus Rhythm 67. Will notify Bary Castilla [facility].

## 2016-02-27 ENCOUNTER — Encounter (HOSPITAL_COMMUNITY)
Admission: RE | Admit: 2016-02-27 | Discharge: 2016-02-27 | Disposition: A | Discharge status: Home / Self Care | Payer: Medicare Other | Source: Ambulatory Visit | Attending: Interventional Cardiology | Admitting: Interventional Cardiology

## 2016-02-27 DIAGNOSIS — Z7982 Long term (current) use of aspirin: Secondary | ICD-10-CM | POA: Diagnosis not present

## 2016-02-27 DIAGNOSIS — I2510 Atherosclerotic heart disease of native coronary artery without angina pectoris: Secondary | ICD-10-CM | POA: Diagnosis not present

## 2016-02-27 DIAGNOSIS — I213 ST elevation (STEMI) myocardial infarction of unspecified site: Secondary | ICD-10-CM | POA: Diagnosis not present

## 2016-02-27 DIAGNOSIS — M8580 Other specified disorders of bone density and structure, unspecified site: Secondary | ICD-10-CM | POA: Diagnosis not present

## 2016-02-27 DIAGNOSIS — Z955 Presence of coronary angioplasty implant and graft: Secondary | ICD-10-CM

## 2016-02-27 DIAGNOSIS — Z79899 Other long term (current) drug therapy: Secondary | ICD-10-CM | POA: Diagnosis not present

## 2016-02-27 DIAGNOSIS — I2111 ST elevation (STEMI) myocardial infarction involving right coronary artery: Secondary | ICD-10-CM

## 2016-02-28 ENCOUNTER — Observation Stay (HOSPITAL_COMMUNITY)
Admission: EM | Admit: 2016-02-28 | Discharge: 2016-02-29 | Disposition: A | Discharge status: Home / Self Care | Payer: Medicare Other | Attending: Internal Medicine | Admitting: Internal Medicine

## 2016-02-28 ENCOUNTER — Emergency Department (HOSPITAL_COMMUNITY): Payer: Medicare Other

## 2016-02-28 ENCOUNTER — Telehealth (HOSPITAL_COMMUNITY): Payer: Self-pay | Admitting: *Deleted

## 2016-02-28 ENCOUNTER — Encounter (HOSPITAL_COMMUNITY): Payer: Self-pay | Admitting: *Deleted

## 2016-02-28 DIAGNOSIS — Z638 Other specified problems related to primary support group: Secondary | ICD-10-CM | POA: Diagnosis not present

## 2016-02-28 DIAGNOSIS — I252 Old myocardial infarction: Secondary | ICD-10-CM | POA: Diagnosis not present

## 2016-02-28 DIAGNOSIS — Z9889 Other specified postprocedural states: Secondary | ICD-10-CM | POA: Diagnosis not present

## 2016-02-28 DIAGNOSIS — I209 Angina pectoris, unspecified: Secondary | ICD-10-CM

## 2016-02-28 DIAGNOSIS — Z7982 Long term (current) use of aspirin: Secondary | ICD-10-CM | POA: Insufficient documentation

## 2016-02-28 DIAGNOSIS — Z8742 Personal history of other diseases of the female genital tract: Secondary | ICD-10-CM | POA: Diagnosis not present

## 2016-02-28 DIAGNOSIS — R079 Chest pain, unspecified: Secondary | ICD-10-CM | POA: Diagnosis not present

## 2016-02-28 DIAGNOSIS — M1990 Unspecified osteoarthritis, unspecified site: Secondary | ICD-10-CM | POA: Insufficient documentation

## 2016-02-28 DIAGNOSIS — E785 Hyperlipidemia, unspecified: Secondary | ICD-10-CM | POA: Diagnosis present

## 2016-02-28 DIAGNOSIS — F439 Reaction to severe stress, unspecified: Secondary | ICD-10-CM

## 2016-02-28 DIAGNOSIS — K219 Gastro-esophageal reflux disease without esophagitis: Secondary | ICD-10-CM | POA: Insufficient documentation

## 2016-02-28 DIAGNOSIS — I25119 Atherosclerotic heart disease of native coronary artery with unspecified angina pectoris: Principal | ICD-10-CM | POA: Diagnosis present

## 2016-02-28 DIAGNOSIS — Z7901 Long term (current) use of anticoagulants: Secondary | ICD-10-CM | POA: Diagnosis not present

## 2016-02-28 DIAGNOSIS — H409 Unspecified glaucoma: Secondary | ICD-10-CM | POA: Diagnosis not present

## 2016-02-28 DIAGNOSIS — I2510 Atherosclerotic heart disease of native coronary artery without angina pectoris: Secondary | ICD-10-CM

## 2016-02-28 DIAGNOSIS — I10 Essential (primary) hypertension: Secondary | ICD-10-CM | POA: Insufficient documentation

## 2016-02-28 DIAGNOSIS — Z79899 Other long term (current) drug therapy: Secondary | ICD-10-CM | POA: Insufficient documentation

## 2016-02-28 DIAGNOSIS — M8580 Other specified disorders of bone density and structure, unspecified site: Secondary | ICD-10-CM | POA: Diagnosis not present

## 2016-02-28 DIAGNOSIS — Z9861 Coronary angioplasty status: Secondary | ICD-10-CM

## 2016-02-28 LAB — BASIC METABOLIC PANEL
Anion gap: 9 (ref 5–15)
BUN: 24 mg/dL — AB (ref 6–20)
CALCIUM: 9.2 mg/dL (ref 8.9–10.3)
CHLORIDE: 108 mmol/L (ref 101–111)
CO2: 25 mmol/L (ref 22–32)
CREATININE: 1 mg/dL (ref 0.44–1.00)
GFR, EST AFRICAN AMERICAN: 60 mL/min — AB (ref >60)
GFR, EST NON AFRICAN AMERICAN: 51 mL/min — AB (ref >60)
Glucose, Bld: 110 mg/dL — ABNORMAL HIGH (ref 65–99)
Potassium: 4.1 mmol/L (ref 3.5–5.1)
SODIUM: 142 mmol/L (ref 135–145)

## 2016-02-28 LAB — CBC
HCT: 35.6 % — ABNORMAL LOW (ref 36.0–46.0)
Hemoglobin: 11.7 g/dL — ABNORMAL LOW (ref 12.0–15.0)
MCH: 31.8 pg (ref 26.0–34.0)
MCHC: 32.9 g/dL (ref 30.0–36.0)
MCV: 96.7 fL (ref 78.0–100.0)
PLATELETS: 199 10*3/uL (ref 150–400)
RBC: 3.68 MIL/uL — ABNORMAL LOW (ref 3.87–5.11)
RDW: 13.8 % (ref 11.5–15.5)
WBC: 4.6 10*3/uL (ref 4.0–10.5)

## 2016-02-28 LAB — TROPONIN I: Troponin I: <0.03 ng/mL (ref <0.031)

## 2016-02-28 LAB — I-STAT TROPONIN, ED: TROPONIN I, POC: 0.01 ng/mL (ref 0.00–0.08)

## 2016-02-28 MED ORDER — METOPROLOL TARTRATE 12.5 MG HALF TABLET
12.5000 mg | ORAL_TABLET | Freq: Two times a day (BID) | ORAL | Status: DC
  Administered 2016-02-28 – 2016-02-29 (×3): 12.5 mg via ORAL
  Filled 2016-02-28 (×3): qty 1

## 2016-02-28 MED ORDER — CARBOXYMETHYLCELLULOSE SODIUM 0.5 % OP SOLN: 1.0000 [drp] | Freq: Three times a day (TID) | OPHTHALMIC | Status: DC | PRN

## 2016-02-28 MED ORDER — POLYVINYL ALCOHOL 1.4 % OP SOLN: 1.0000 [drp] | Freq: Three times a day (TID) | OPHTHALMIC | Status: DC | PRN

## 2016-02-28 MED ORDER — ACETAMINOPHEN 325 MG PO TABS: 650.0000 mg | ORAL_TABLET | ORAL | Status: DC | PRN

## 2016-02-28 MED ORDER — ENOXAPARIN SODIUM 40 MG/0.4ML ~~LOC~~ SOLN
40.0000 mg | SUBCUTANEOUS | Status: DC
  Filled 2016-02-28: qty 0.4

## 2016-02-28 MED ORDER — ISOSORBIDE MONONITRATE ER 30 MG PO TB24
30.0000 mg | ORAL_TABLET | Freq: Every day | ORAL | Status: DC
  Administered 2016-02-28 – 2016-02-29 (×2): 30 mg via ORAL
  Filled 2016-02-28 (×4): qty 1

## 2016-02-28 MED ORDER — TICAGRELOR 90 MG PO TABS
90.0000 mg | ORAL_TABLET | Freq: Two times a day (BID) | ORAL | Status: DC
  Administered 2016-02-28 – 2016-02-29 (×3): 90 mg via ORAL
  Filled 2016-02-28 (×3): qty 1

## 2016-02-28 MED ORDER — ONDANSETRON HCL 4 MG/2ML IJ SOLN: 4.0000 mg | Freq: Four times a day (QID) | INTRAMUSCULAR | Status: DC | PRN

## 2016-02-28 MED ORDER — ASPIRIN 81 MG PO CHEW
81.0000 mg | CHEWABLE_TABLET | Freq: Every day | ORAL | Status: DC
  Administered 2016-02-29: 81 mg via ORAL
  Filled 2016-02-28 (×2): qty 1

## 2016-02-28 MED ORDER — ALPRAZOLAM 0.25 MG PO TABS: 0.2500 mg | ORAL_TABLET | Freq: Two times a day (BID) | ORAL | Status: DC | PRN

## 2016-02-28 MED ORDER — ATORVASTATIN CALCIUM 40 MG PO TABS
40.0000 mg | ORAL_TABLET | Freq: Every day | ORAL | Status: DC
  Administered 2016-02-28 – 2016-02-29 (×2): 40 mg via ORAL
  Filled 2016-02-28 (×2): qty 1

## 2016-02-28 MED ORDER — ADULT MULTIVITAMIN W/MINERALS CH
1.0000 | ORAL_TABLET | Freq: Every day | ORAL | Status: DC
  Administered 2016-02-28 – 2016-02-29 (×2): 1 via ORAL
  Filled 2016-02-28 (×2): qty 1

## 2016-02-28 NOTE — Consult Note (Signed)
Cardiology Consult    Patient ID: Nancy Neal MRN: [id number], DOB/AGE: [birth year]   Admit date: 02/28/2016 Date of Consult: 02/28/2016  Primary Physician: Rogelia Boga, MD Reason for Consult: Chest Pain Primary Cardiologist: Dr. Katrinka Blazing Requesting Provider: Dr. Konrad Dolores  History of Present Illness    Nancy Neal is a 82 y.o. female with past medical history of CAD (s/p inferior STEMI in 11/2015), HLD, osteopenia, and glaucoma who presents to [facility] ED on 02/28/2016 for evaluation of chest pain.   She underwent recent catheterization on 12/15/2015 in the setting of a STEMI which showed 100% stenosis of the Prox RCA, 100% stenosis of the RPDA, and 65% stenosis of the Prox-Mid LAD. PCI using a Promus DES was placed to the RCA. Her EF was normal at 60-70% and she was started on DAPT with ASA and Brilinta.   With the 65% stenosis in the LAD, she underwent a Lexiscan Myoview to assess for ischemia in this territory which was performed on 01/03/2016 and showed no significant reversible ischemia.    She was recently seen by Dr. Katrinka Blazing on 02/15/2016 and reported having atypical left parasternal pain which was made worse by palpation and movements. She has been involved in Phase 2 Cardiac Rehab and has been progressing well.   She reports having the left pectoral pain which has been tender to touch but says earlier this morning, she developed a pressure in her sternum which resembled her previous MI. She was diaphoretic at the time and was anxious due to thinking she was having a repeat heart attack. Denies any associated dyspnea, nausea, or vomiting. She took 2 SL NTG without any relief and the pain resolved spontaneously within 30 minutes. She denies any repeat pain since.  Her main concern at the time of this encounter is what could be causing her "hot flashes". Reports her diaphoretic spells mostly happen at night and have been occurring since the beginning of the year. Denies any  increase in these but says she has developed a few of these in the daytime which is unusual. She denies any significant weight loss. TSH was checked 3 months ago and was 0.466.  Reports being under increased "personal stress" since the beginning of the year and is interested in seeing a counselor as an outpatient.  While in the ED, her initial two troponin values have been negative. CBC shows a WBC of 4.6, Hgb 11.7, and platelets 199. BMET shows K+ of 4.1 and creatinine of 1.00. CXR shows no acute cardiopulmonary findings. EKG shows NSR, HR 69, and nonspecific ST changes in the anterior leads. TWI in inferior leads is improved when compared to previous tracings.    Past Medical History   Past Medical History  Diagnosis Date  . ALLERGIC RHINITIS 06/02/2007  . OSTEOPENIA 06/02/2007  . VAGINITIS, ATROPHIC, POSTMENOPAUSAL 12/06/2008  . VERTIGO 02/16/2009  . Glaucoma 1-12    open angle  . Coronary artery disease 11/2015    STEMI.  Promus DES to RCA.    . Diverticulosis of colon 2003    by 2003 and 2008 colonoscopy.   . Myocardial infarction (HCC) 12/15/2015  . History of blood transfusion 1971    "when I had hysterectomy"  . GERD (gastroesophageal reflux disease)   . Arthritis     "maybe in my right knee" (12/19/2015)    Past Surgical History  Procedure Laterality Date  . Cardiac catheterization N/A 12/15/2015    Procedure: Left Heart Cath and Coronary Angiography;  Surgeon: Lyn Records, MD;  Location: [facility] INVASIVE CV LAB;  Service: Cardiovascular;  Laterality: N/A;  . Cardiac catheterization N/A 12/15/2015    Procedure: Coronary Stent Intervention;  Surgeon: Lyn Records, MD;  Location: [facility] INVASIVE CV LAB;  Service: Cardiovascular;  Laterality: N/A;  . Shoulder arthroscopy w/ rotator cuff repair Right 11/2014  . Appendectomy  1971  . Total abdominal hysterectomy  1971  . Hemorrhoid surgery  ~ 1968  . Eye surgery Bilateral     "laser tx for glaucoma"  . Colonoscopy N/A 12/20/2015     Procedure: COLONOSCOPY;  Surgeon: Hilarie Fredrickson, MD;  Location: [facility] ENDOSCOPY;  Service: Endoscopy;  Laterality: N/A;     Allergies  Allergies  Allergen Reactions  . Codeine Sulfate Nausea And Vomiting  . Meclizine Other (See Comments)    glaucoma    Inpatient Medications    . aspirin  81 mg Oral Daily  . atorvastatin  40 mg Oral Daily  . enoxaparin (LOVENOX) injection  40 mg Subcutaneous Q24H  . metoprolol tartrate  12.5 mg Oral BID  . multivitamin with minerals  1 tablet Oral Daily  . ticagrelor  90 mg Oral BID    Family History    Family History  Problem Relation Age of Onset  . Suicidality Mother   . Heart disease Father   . Arthritis Brother     Social History    Social History   Social History  . Marital Status: Widowed    Spouse Name: N/A  . Number of Children: N/A  . Years of Education: N/A   Occupational History  . Not on file.   Social History Main Topics  . Smoking status: Never Smoker   . Smokeless tobacco: Never Used  . Alcohol Use: 4.2 oz/week    7 Glasses of wine per week  . Drug Use: No  . Sexual Activity: No   Other Topics Concern  . Not on file   Social History Narrative     Review of Systems    General:  No chills, fever, or weight changes.  Cardiovascular:  No dyspnea on exertion, edema, orthopnea, palpitations, paroxysmal nocturnal dyspnea. Positive for chest pain and diaphoresis. Dermatological: No rash, lesions/masses Respiratory: No cough, dyspnea Urologic: No hematuria, dysuria Abdominal:   No nausea, vomiting, diarrhea, bright red blood per rectum, melena, or hematemesis Neurologic:  No visual changes, wkns, changes in mental status. All other systems reviewed and are otherwise negative except as noted above.  Physical Exam    Blood pressure 112/71, pulse 80, temperature 98.2 F (36.8 C), resp. rate 17, height 5\' 5"  (1.651 m), weight 133 lb (60.328 kg), SpO2 97 %.  General: Pleasant, Caucasian female appearing in NAD.  Appears younger than her stated age. Psych: Normal affect. Neuro: Alert and oriented X 3. Moves all extremities spontaneously. HEENT: Normal  Neck: Supple without bruits or JVD. Lungs:  Resp regular and unlabored, CTA without wheezing or rales. Heart: RRR no s3, s4, or murmurs. Abdomen: Soft, non-tender, non-distended, BS + x 4.  Extremities: No clubbing, cyanosis or edema. DP/PT/Radials 2+ and equal bilaterally.  Labs    Troponin [facility] Test)  Recent Labs  02/28/16 0536  TROPIPOC 0.01    Recent Labs  02/28/16 1110  TROPONINI <0.03   Lab Results  Component Value Date   WBC 4.6 02/28/2016   HGB 11.7* 02/28/2016   HCT 35.6* 02/28/2016   MCV 96.7 02/28/2016   PLT 199 02/28/2016  Recent Labs Lab 02/28/16 0528  NA 142  K 4.1  CL 108  CO2 25  BUN 24*  CREATININE 1.00  CALCIUM 9.2  GLUCOSE 110*   Lab Results  Component Value Date   CHOL 118* 02/15/2016   HDL 55 02/15/2016   LDLCALC 35 02/15/2016   TRIG 140 02/15/2016   No results found for: [facility]   Radiology Studies    Dg Chest 2 View: 02/28/2016  CLINICAL DATA:  Midchest pain today EXAM: CHEST  2 VIEW COMPARISON:  12/17/2015 FINDINGS: There is mild hyperinflation, unchanged. Heart size is normal. The lungs are clear. The pulmonary vasculature is normal. There is no pleural effusion. Hilar and mediastinal contours are unremarkable and unchanged. IMPRESSION: Mild hyperinflation.  No acute cardiopulmonary findings. Electronically Signed   By: Ellery Plunk M.D.   On: 02/28/2016 06:15    EKG & Cardiac Imaging    EKG: NSR, HR 69, and nonspecific ST changes in the anterior leads. TWI in inferior leads is improved when compared to previous tracings.   Cardiac Catheterization: 12/15/2015 1. Prox LAD to Mid LAD lesion, 65% stenosed. 2. Prox RCA lesion, 100% stenosed. Post intervention, there is a 0% residual stenosis. 3. The left ventricular systolic function is normal. 4. RPDA lesion, 100%  stenosed.   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  Successful angioplasty and stenting of the right coronary from 100% to 0% using 3.0 x 20 Promus Premier drug-eluting stent postdilated to 3.25 mm in diameter.   Complicated by transient no reflow treated with pharmacology (IC verapamil, IC nitroglycerin, and IV Aggrastat).   Moderate proximal to mid LAD with 60-70% stenosis.  Marked tortuosity noted in a normal circumflex coronary artery   Recommendations:    Aggressive management of risk factors (elevated lipids)  Aggrastat infusion for 18 hours due to heavy thrombus burden within the treated segment.  Low-dose beta blocker therapy  OP functional testing to exclude the possibility of left anterior descending ischemia.  Potential discharge 48-72 hours depending upon course  Assessment & Plan    1. Chest Pain/ History of CAD - s/p recent inferior STEMI in 11/2015 with 100% stenosis of the Prox RCA, 100% stenosis of the RPDA, and 65% stenosis of the Prox-Mid LAD noted. PCI of the RCA was performed using a DES. Underwent a recent Lexiscan Myoview on 01/03/2016 to assess for ischemia in the LAD territory which showed no significant reversible ischemia.   - has been experiencing an atypical, reproducible pain along her left pectoral region but reports this morning she developed a pressure in her sternum which resembled her previous MI but was "milder". Associated with diaphoresis. No relief with SL NTG. Resolved within 30 minutes. Reports being under increased "personal stress" since the beginning of the year and is anxious she will have another MI.  - EKG shows TWI in the inferior leads (improved when compared to previous tracings) and slight ST elevation in the anterior leads (new). - her repeat symptoms this morning being consistent with her recent  MI is concerning but with a cath just two months ago, I would doubt in-stent restenosis or worsening of her LAD lesion (especially with low-risk NST two months ago). She reports good compliance with her ASA and Brilinta. - would consider starting on Imdur  daily if BP will allow. She may require a repeat cath in the future to reassess  her LAD. Have ordered a repeat EKG for now with her slight ST changes in the anterior leads noted this AM. If cyclic troponin  values become significantly elevated, would need a re-look cath this admission.  2. HLD - continue statin therapy  Signed, Ellsworth Lennox, PA-C 02/28/2016, 12:38 PM Pager: [phone]

## 2016-02-28 NOTE — ED Notes (Signed)
Attempted report x1. 

## 2016-02-28 NOTE — H&P (Signed)
History and Physical    Nancy Neal:[id number] DOB: May 13, 1932 DOA: 02/28/2016  PCP: Rogelia Boga, MD  Patient coming from/resides with: Private residence; lives with son  Chief Complaint: Midsternal chest pain  HPI: Nancy Neal is a 82 y.o. female with medical history significant for CAD status post PCI in February 2017 with residual LAD disease, dyslipidemia, vertigo, prior GI bleed secondary to diverticular disease who presents to the ER with midsternal chest pain. Since her STEMI in February patient has followed regularly with her cardiologist. She underwent a routine stress test in March with low risk findings. She has been attending cardiac rehabilitation without symptoms. She reported to the cardiac rehabilitation nurse on 5/8 she had experienced some left arm pain that resolved after nitroglycerin 1 dose. Overnight she awakened to go to the bathroom and felt very hot (has been having similar symptoms since her MI) and within a few minutes developed midsternal chest pain level 3/10 which was similar not as severe as her presenting symptoms for her MI. This was associated with diaphoresis. She took total of 2 sub-lingual nitroglycerin 5 minutes apart without any improvement in symptoms so called 911 and was instructed to take aspirin. After the nitroglycerin she felt lightheaded so she laid down. She did not have any other associated symptoms. By the time she was transported to the hospital for chest pain had resolved.  Upon my evaluation patient confirm the above symptoms. She also admits to recent increase in personal stressors which she thinks may be contributing to some of her symptomatology.  ED Course:  Afebrile-BP 98/75-pulse 98-respirations 18-room air saturations 98% 2 view CXR: Mild hyperinflation without any acute cardiac pulmonary findings. Lab data: Sodium 142, potassium 4.1, BUN 24, creatinine 1.0, glucose 110, troponin 0.01, WBC 4600, hemoglobin 11.7, platelets  199,000.   Review of Systems:  In addition to the HPI above,  No Fever-chills, myalgias or other constitutional symptoms No Headache, changes with Vision or hearing, new weakness, tingling, numbness in any extremity, No problems swallowing food or Liquids, indigestion/reflux No Cough or Shortness of Breath, palpitations, orthopnea or DOE No Abdominal pain, N/V; no melena or hematochezia, no dark tarry stools,  No dysuria, hematuria or flank pain No new skin rashes, lesions, masses or bruises, No new joints pains-aches No recent weight gain or loss No polyuria, polydypsia or polyphagia,   Past Medical History  Diagnosis Date  . ALLERGIC RHINITIS 06/02/2007  . OSTEOPENIA 06/02/2007  . VAGINITIS, ATROPHIC, POSTMENOPAUSAL 12/06/2008  . VERTIGO 02/16/2009  . Glaucoma 1-12    open angle  . Coronary artery disease 11/2015    STEMI.  Promus DES to RCA.    . Diverticulosis of colon 2003    by 2003 and 2008 colonoscopy.   . Myocardial infarction (HCC) 12/15/2015  . History of blood transfusion 1971    "when I had hysterectomy"  . GERD (gastroesophageal reflux disease)   . Arthritis     "maybe in my right knee" (12/19/2015)    Past Surgical History  Procedure Laterality Date  . Cardiac catheterization N/A 12/15/2015    Procedure: Left Heart Cath and Coronary Angiography;  Surgeon: Lyn Records, MD;  Location: [facility] INVASIVE CV LAB;  Service: Cardiovascular;  Laterality: N/A;  . Cardiac catheterization N/A 12/15/2015    Procedure: Coronary Stent Intervention;  Surgeon: Lyn Records, MD;  Location: [facility] INVASIVE CV LAB;  Service: Cardiovascular;  Laterality: N/A;  . Shoulder arthroscopy w/ rotator cuff repair Right 11/2014  . Appendectomy  1971  . Total abdominal hysterectomy  1971  . Hemorrhoid surgery  ~ 1968  . Eye surgery Bilateral     "laser tx for glaucoma"  . Colonoscopy N/A 12/20/2015    Procedure: COLONOSCOPY;  Surgeon: Hilarie Fredrickson, MD;  Location: [facility] ENDOSCOPY;  Service: Endoscopy;   Laterality: N/A;     reports that she has never smoked. She has never used smokeless tobacco. She reports that she drinks about 4.2 oz of alcohol per week. She reports that she does not use illicit drugs.  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 Codeine Sulfate Nausea And Vomiting  . Meclizine Other (See Comments)    glaucoma    Family History  Problem Relation Age of Onset  . Suicidality Mother   . Heart disease Father   . Arthritis Brother      Prior to Admission medications   Medication Sig Start Date End Date Taking? Authorizing Provider  acetaminophen (TYLENOL) 325 MG tablet Take 2 tablets (650 mg total) by mouth every 4 (four) hours as needed for headache or mild pain. 12/17/15  Yes Abelino Derrick, PA-C  aspirin 81 MG chewable tablet Chew 1 tablet (81 mg total) by mouth daily. 12/17/15  Yes Luke K Kilroy, PA-C  atorvastatin (LIPITOR) 40 MG tablet Take 40 mg by mouth daily.   Yes Historical Provider, MD  carboxymethylcellulose (REFRESH PLUS) 0.5 % SOLN Place 1 drop into both eyes 3 (three) times daily as needed (dry eyes).    Yes Historical Provider, MD  metoprolol tartrate (LOPRESSOR) 25 MG tablet Take 0.5 tablets (12.5 mg total) by mouth 2 (two) times daily. 12/17/15  Yes Abelino Derrick, PA-C  Multiple Vitamin (MULTIVITAMIN WITH MINERALS) TABS tablet Take 1 tablet by mouth daily.   Yes Historical Provider, MD  nitroGLYCERIN (NITROSTAT) 0.4 MG SL tablet Place 1 tablet (0.4 mg total) under the tongue every 5 (five) minutes as needed for chest pain. 12/17/15  Yes Luke K Kilroy, PA-C  OVER THE COUNTER MEDICATION Take 1 tablet by mouth daily. Over the counter antihistamine   Yes Historical Provider, MD  ticagrelor (BRILINTA) 90 MG TABS tablet Take 1 tablet (90 mg total) by mouth 2 (two) times daily. 01/17/16  Yes  Lyn Records, MD  VITAMIN E PO Take 1 capsule by mouth daily.    Yes Historical Provider, MD    Physical Exam: Filed Vitals:   02/28/16 0533 02/28/16 0730 02/28/16 0800 02/28/16 0830  BP: 98/75 127/82 123/83 115/71  Pulse: 69 72 71 65  Temp: 98.2 F (36.8 C)     Resp: Height:  (1.651 m)     Weight: 133 lb (60.328 kg)     SpO2: 98% 99% 98% 98%      Constitutional: NAD, calm, comfortable Eyes: PERRL, lids and conjunctivae normal ENMT: Mucous membranes are moist. Posterior pharynx clear of any exudate or lesions.Normal dentition.  Neck: normal, supple, no masses, no thyromegaly Respiratory: clear to auscultation bilaterally, no wheezing, no crackles. Normal respiratory effort. No accessory muscle use.  Cardiovascular: Regular rate and rhythm, no murmurs / rubs / gallops. No extremity edema. 2+ pedal pulses. No carotid bruits. Chest wall was nontender to palpation Abdomen: no tenderness, no masses palpated. No hepatosplenomegaly. Bowel sounds positive.  Musculoskeletal: no clubbing / cyanosis. No joint deformity  upper and lower extremities. Good ROM, no contractures. Normal muscle tone.  Skin: no rashes, lesions, ulcers. No induration Neurologic: CN 2-12 grossly intact. Sensation intact, DTR normal. Strength 5/5 x all 4 extremities.  Psychiatric: Normal judgment and insight. Alert and oriented x 3. Normal mood.    Labs on Admission: I have personally reviewed following labs and imaging studies  CBC:  Recent Labs Lab 02/28/16 0528  WBC 4.6  HGB 11.7*  HCT 35.6*  MCV 96.7  PLT 199   Basic Metabolic Panel:  Recent Labs Lab 02/28/16 0528  NA 142  K 4.1  CL 108  CO2 25  GLUCOSE 110*  BUN 24*  CREATININE 1.00  CALCIUM 9.2   GFR: Estimated Creatinine Clearance: 39.7 mL/min (by C-G formula based on Cr of 1). Liver Function Tests: No results for input(s): AST, ALT, ALKPHOS, BILITOT, PROT, ALBUMIN in the last 168 hours. No results for input(s):  LIPASE, AMYLASE in the last 168 hours. No results for input(s): AMMONIA in the last 168 hours. Coagulation Profile: No results for input(s): INR, PROTIME in the last 168 hours. Cardiac Enzymes: No results for input(s): CKTOTAL, CKMB, CKMBINDEX, TROPONINI in the last 168 hours. BNP (last 3 results) No results for input(s): PROBNP in the last 8760 hours. HbA1C: No results for input(s): HGBA1C in the last 72 hours. CBG: No results for input(s): GLUCAP in the last 168 hours. Lipid Profile: No results for input(s): CHOL, HDL, LDLCALC, TRIG, CHOLHDL, LDLDIRECT in the last 72 hours. Thyroid Function Tests: No results for input(s): TSH, T4TOTAL, FREET4, T3FREE, THYROIDAB in the last 72 hours. Anemia Panel: No results for input(s): VITAMINB12, FOLATE, FERRITIN, TIBC, IRON, RETICCTPCT in the last 72 hours. Urine analysis:    Component Value Date/Time   COLORURINE YELLOW 12/15/2015 2014   APPEARANCEUR CLEAR 12/15/2015 2014   LABSPEC 1.010 12/15/2015 2014   PHURINE 6.0 12/15/2015 2014   GLUCOSEU NEGATIVE 12/15/2015 2014   HGBUR TRACE* 12/15/2015 2014   HGBUR negative 06/13/2010 0958   BILIRUBINUR NEGATIVE 12/15/2015 2014   KETONESUR 15* 12/15/2015 2014   PROTEINUR NEGATIVE 12/15/2015 2014   UROBILINOGEN 0.2 06/13/2010 0958   NITRITE NEGATIVE 12/15/2015 2014   LEUKOCYTESUR NEGATIVE 12/15/2015 2014   Sepsis Labs: @LABRCNTIP (procalcitonin:4,lacticidven:4) )No results found for this or any previous visit (from the past 240 hour(s)).   Radiological Exams on Admission: Dg Chest 2 View  02/28/2016  CLINICAL DATA:  Midchest pain today EXAM: CHEST  2 VIEW COMPARISON:  12/17/2015 FINDINGS: There is mild hyperinflation, unchanged. Heart size is normal. The lungs are clear. The pulmonary vasculature is normal. There is no pleural effusion. Hilar and mediastinal contours are unremarkable and unchanged. IMPRESSION: Mild hyperinflation.  No acute cardiopulmonary findings. Electronically Signed   By:  Ellery Plunk M.D.   On: 02/28/2016 06:15    EKG: (Independently reviewed) Sinus rhythm with ventricular rate 69 bpm, QTC 413 seconds, chronic stable trace ST elevation in anterolateral leads and unchanged from previous EKG  Assessment/Plan Principal Problem:   Chest pain/CAD S/P PCI 12/15/15 with residual LAD dz -Patient presents with typical chest pain for her that has been recurrent 1 since Monday and relieved with nitroglycerin; potentially either exacerbated by her caused by increased personal stressors -Primary concern at this juncture is for in-stent restenosis -Cardiology consulted -Continue preadmission baby aspirin, statin, Lopressor and Brilinta -Cycle troponin -Underwent nuclear med Myoview perfusion study in March and indicated as low risk study  Active Problems:   Stress at home -Discussed management strategies and need for  outpatient referral to counseling services -prn Xanax while inpatient    Hyperlipidemia -Continue Lipitor -LFTs as an outpatient at the end of April unremarkable      DVT prophylaxis: Lovenox Code Status: Full code Family Communication: No family at bedside-daughter lives in [place] Disposition Plan: Discharge back to preadmission home environment Consults called: [facility]  Admission status: Telemetry/observation     Channelle Bottger L. ANP-BC [facility] Pager [phone]   If 7PM-7AM, please contact night-coverage [email or web address] Password [facility]  02/28/2016, 8:52 AM

## 2016-02-28 NOTE — ED Notes (Signed)
No pain now shes c/o being cold

## 2016-02-28 NOTE — ED Notes (Signed)
Attempted report x 2 

## 2016-02-28 NOTE — ED Provider Notes (Signed)
CSN: [id number]     Arrival date & time 02/28/16  0509 History   First MD Initiated Contact with Patient 02/28/16 0604     Chief Complaint  Patient presents with  . Chest Pain     (Consider location/radiation/quality/duration/timing/severity/associated sxs/prior Treatment) HPI  Nancy Neal is an 82yo female, PMH of recent MI in feb requiring a stent, presenting today with CP.  Patient states she woke up in the middle of the night with diaphoresis.  Shortly after, she experienced a tight muscle pain in the center of her chest.  She had SOB of as well.  She denies emesis.  This scared her because it felt just like her last MI only the pain was milder.  So she took 2 nitro which mildly helped her pain as well as full dose aspirin.  She then got lightheaded after nitro and laid down and called 911.  Currently, she states she has no chest pain.  There are no further complaints.  10 Systems reviewed and are negative for acute change except as noted in the HPI.    Cardiologist:  Dr. Katrinka Blazing  Past Medical History  Diagnosis Date  . ALLERGIC RHINITIS 06/02/2007  . OSTEOPENIA 06/02/2007  . VAGINITIS, ATROPHIC, POSTMENOPAUSAL 12/06/2008  . VERTIGO 02/16/2009  . Glaucoma 1-12    open angle  . Coronary artery disease 11/2015    STEMI.  Promus DES to RCA.    . Diverticulosis of colon 2003    by 2003 and 2008 colonoscopy.   . Myocardial infarction (HCC) 12/15/2015  . History of blood transfusion 1971    "when I had hysterectomy"  . GERD (gastroesophageal reflux disease)   . Arthritis     "maybe in my right knee" (12/19/2015)   Past Surgical History  Procedure Laterality Date  . Cardiac catheterization N/A 12/15/2015    Procedure: Left Heart Cath and Coronary Angiography;  Surgeon: Lyn Records, MD;  Location: [facility] INVASIVE CV LAB;  Service: Cardiovascular;  Laterality: N/A;  . Cardiac catheterization N/A 12/15/2015    Procedure: Coronary Stent Intervention;  Surgeon: Lyn Records, MD;  Location: [facility]  INVASIVE CV LAB;  Service: Cardiovascular;  Laterality: N/A;  . Shoulder arthroscopy w/ rotator cuff repair Right 11/2014  . Appendectomy  1971  . Total abdominal hysterectomy  1971  . Hemorrhoid surgery  ~ 1968  . Eye surgery Bilateral     "laser tx for glaucoma"  . Colonoscopy N/A 12/20/2015    Procedure: COLONOSCOPY;  Surgeon: Hilarie Fredrickson, MD;  Location: [facility] ENDOSCOPY;  Service: Endoscopy;  Laterality: N/A;   Family History  Problem Relation Age of Onset  . Suicidality Mother   . Heart disease Father   . Arthritis Brother    Social History  Substance Use Topics  . Smoking status: Never Smoker   . Smokeless tobacco: Never Used  . Alcohol Use: 4.2 oz/week    7 Glasses of wine per week   OB History    No data available     Review of Systems    Allergies  Codeine sulfate and Meclizine  Home Medications   Prior to Admission medications   Medication Sig Start Date End Date Taking? Authorizing Provider  acetaminophen (TYLENOL) 325 MG tablet Take 2 tablets (650 mg total) by mouth every 4 (four) hours as needed for headache or mild pain. 12/17/15  Yes Abelino Derrick, PA-C  aspirin 81 MG chewable tablet Chew 1 tablet (81 mg total) by mouth daily. 12/17/15  Yes  Luke K Kilroy, PA-C  atorvastatin (LIPITOR) 40 MG tablet Take 40 mg by mouth daily.   Yes Historical Provider, MD  carboxymethylcellulose (REFRESH PLUS) 0.5 % SOLN Place 1 drop into both eyes 3 (three) times daily as needed (dry eyes).    Yes Historical Provider, MD  metoprolol tartrate (LOPRESSOR) 25 MG tablet Take 0.5 tablets (12.5 mg total) by mouth 2 (two) times daily. 12/17/15  Yes Abelino Derrick, PA-C  Multiple Vitamin (MULTIVITAMIN WITH MINERALS) TABS tablet Take 1 tablet by mouth daily.   Yes Historical Provider, MD  nitroGLYCERIN (NITROSTAT) 0.4 MG SL tablet Place 1 tablet (0.4 mg total) under the tongue every 5 (five) minutes as needed for chest pain. 12/17/15  Yes Luke K Kilroy, PA-C  OVER THE COUNTER MEDICATION Take 1  tablet by mouth daily. Over the counter antihistamine   Yes Historical Provider, MD  ticagrelor (BRILINTA) 90 MG TABS tablet Take 1 tablet (90 mg total) by mouth 2 (two) times daily. 01/17/16  Yes Lyn Records, MD  VITAMIN E PO Take 1 capsule by mouth daily.    Yes Historical Provider, MD   BP 98/75 mmHg  Pulse 69  Temp(Src) 98.2 F (36.8 C)  Resp 18  Ht  (1.651 m)  Wt 133 lb (60.328 kg)  BMI 22.13 kg/m2  SpO2 98% Physical Exam  Constitutional: She is oriented to person, place, and time. She appears well-developed and well-nourished. No distress.  HENT:  Head: Normocephalic and atraumatic.  Nose: Nose normal.  Mouth/Throat: Oropharynx is clear and moist. No oropharyngeal exudate.  Eyes: Conjunctivae and EOM are normal. Pupils are equal, round, and reactive to light. No scleral icterus.  Neck: Normal range of motion. Neck supple. No JVD present. No tracheal deviation present. No thyromegaly present.  Cardiovascular: Normal rate, regular rhythm and normal heart sounds.  Exam reveals no gallop and no friction rub.   No murmur heard. Pulmonary/Chest: Effort normal and breath sounds normal. No respiratory distress. She has no wheezes. She exhibits no tenderness.  Abdominal: Soft. Bowel sounds are normal. She exhibits no distension and no mass. There is no tenderness. There is no rebound and no guarding.  Musculoskeletal: Normal range of motion. She exhibits no edema or tenderness.  Lymphadenopathy:    She has no cervical adenopathy.  Neurological: She is alert and oriented to person, place, and time. No cranial nerve deficit. She exhibits normal muscle tone.  Skin: Skin is warm and dry. No rash noted. No erythema. No pallor.  Nursing note and vitals reviewed.   ED Course  Procedures (including critical care time) Labs Review Labs Reviewed  CBC - Abnormal; Notable for the following:    RBC 3.68 (*)    Hemoglobin 11.7 (*)    HCT 35.6 (*)    All other components within normal  limits  BASIC METABOLIC PANEL  I-STAT TROPOININ, ED    Imaging Review Dg Chest 2 View  02/28/2016  CLINICAL DATA:  Midchest pain today EXAM: CHEST  2 VIEW COMPARISON:  12/17/2015 FINDINGS: There is mild hyperinflation, unchanged. Heart size is normal. The lungs are clear. The pulmonary vasculature is normal. There is no pleural effusion. Hilar and mediastinal contours are unremarkable and unchanged. IMPRESSION: Mild hyperinflation.  No acute cardiopulmonary findings. Electronically Signed   By: Ellery Plunk M.D.   On: 02/28/2016 06:15   I have personally reviewed and evaluated these images and lab results as part of my medical decision-making.   EKG Interpretation   Date/Time:  Thursday Feb 28 2016 05:17:26 EDT Ventricular Rate:  69 PR Interval:  167 QRS Duration: 91 QT Interval:  386 QTC Calculation: 413 R Axis:   31 Text Interpretation:  Sinus rhythm Borderline T abnormalities, inferior  leads Minimal ST elevation, anterior leads No significant change since  last tracing Confirmed by Erroll Luna [phone]) on 02/28/2016  6:04:56 AM      MDM   Final diagnoses:  None      Patient presents to the ED for CP highly concerning for ACS.  She states the pain is similar to her prior MI.  Troponin is neg and EKG does not show ischemia.  Will admit to hospitlist for CP work up.   6:37 AM I spoke with Dr. Toniann Fail who accepts patient to tele.  Patient is currently chest pain free after nitro.  Tomasita Crumble, MD 02/28/16 [phone]

## 2016-02-28 NOTE — ED Notes (Signed)
The pt arrived by gems from home where she woke from sleep feeling hot with chest pressure in the center of her chest.  She went to the br felt nauseated diaphoresis with the chest pain.  She was given 4 baby aspirin and the pt took 2 sl nitro  Iv per ems  No chest pain on arrival

## 2016-02-28 NOTE — ED Notes (Signed)
Pt ambulatory w/ steady gait to restroom. 

## 2016-02-28 NOTE — Progress Notes (Signed)
Patient was educated on the safety and fall plan. Patient refused to have bed alarm on. [facility] [employer]

## 2016-02-28 NOTE — Plan of Care (Signed)
82 year old female with history of CAD status post stenting recently and also a recent GI bleed presents with chest pain. Chest pain improved with nitroglycerin sublingual. Patient is chest pain-free as per the ER physician. Patient will be admitted for further management of chest pain.

## 2016-02-28 NOTE — ED Notes (Signed)
Ordered [facility]

## 2016-02-28 NOTE — ED Notes (Signed)
Ordered [facility] 

## 2016-02-29 ENCOUNTER — Encounter (HOSPITAL_COMMUNITY): Payer: Medicare Other

## 2016-02-29 DIAGNOSIS — I2510 Atherosclerotic heart disease of native coronary artery without angina pectoris: Secondary | ICD-10-CM | POA: Diagnosis not present

## 2016-02-29 DIAGNOSIS — K219 Gastro-esophageal reflux disease without esophagitis: Secondary | ICD-10-CM | POA: Diagnosis not present

## 2016-02-29 DIAGNOSIS — I10 Essential (primary) hypertension: Secondary | ICD-10-CM | POA: Insufficient documentation

## 2016-02-29 DIAGNOSIS — R079 Chest pain, unspecified: Secondary | ICD-10-CM | POA: Diagnosis not present

## 2016-02-29 DIAGNOSIS — I252 Old myocardial infarction: Secondary | ICD-10-CM | POA: Diagnosis not present

## 2016-02-29 DIAGNOSIS — I25119 Atherosclerotic heart disease of native coronary artery with unspecified angina pectoris: Secondary | ICD-10-CM | POA: Diagnosis not present

## 2016-02-29 DIAGNOSIS — M1990 Unspecified osteoarthritis, unspecified site: Secondary | ICD-10-CM | POA: Diagnosis not present

## 2016-02-29 DIAGNOSIS — E785 Hyperlipidemia, unspecified: Secondary | ICD-10-CM | POA: Diagnosis not present

## 2016-02-29 LAB — TROPONIN I

## 2016-02-29 MED ORDER — ISOSORBIDE MONONITRATE ER 30 MG PO TB24: 30.0000 mg | ORAL_TABLET | Freq: Every day | ORAL | Status: DC

## 2016-02-29 NOTE — Care Management Obs Status (Signed)
MEDICARE OBSERVATION STATUS NOTIFICATION   Patient Details  Name: Nancy Neal MRN: [id number] Date of Birth: Mar 19, 1932   Medicare Observation Status Notification Given:  Yes (chest pain)    Gala Lewandowsky, RN 02/29/2016, 2:06 PM

## 2016-02-29 NOTE — Discharge Summary (Signed)
Physician Discharge Summary  Nancy Neal ZOX:[id number] DOB: 11/03/1931 DOA: 02/28/2016  PCP: Rogelia Boga, MD  Admit date: 02/28/2016 Discharge date: 02/29/2016  Recommendations for Outpatient Follow-up:  Please note new medication is isosorbide 30 mg daily on discharge.  Discharge Diagnoses:  Principal Problem:   Chest pain Active Problems:   Hyperlipidemia   CAD S/P PCI 12/15/15   CAD - residual LAD disease   Stress at home    Discharge Condition: stable   Diet recommendation: as tolerated   History of present illness:  Per HPI on admission "82 y.o. female with medical history significant for CAD status post PCI in February 2017 with residual LAD disease, dyslipidemia, vertigo, prior GI bleed secondary to diverticular disease who presents to the ER with midsternal chest pain. Since her STEMI in February patient has followed regularly with her cardiologist. She underwent a routine stress test in March with low risk findings. She has been attending cardiac rehabilitation without symptoms. She reported to the cardiac rehabilitation nurse on 5/8 she had experienced some left arm pain that resolved after nitroglycerin 1 dose. Overnight she awakened to go to the bathroom and felt very hot (has been having similar symptoms since her MI) and within a few minutes developed midsternal chest pain level 3/10 which was similar not as severe as her presenting symptoms for her MI. This was associated with diaphoresis. She took total of 2 sub-lingual nitroglycerin 5 minutes apart without any improvement in symptoms so called 911 and was instructed to take aspirin. After the nitroglycerin she felt lightheaded so she laid down. She did not have any other associated symptoms. By the time she was transported to the hospital for chest pain had resolved."  Hospital Course:   Principal Problem:   Chest pain / history of CAD - pt is s/p recent inferior STEMI in 11/2015 with 100% stenosis of  the Prox RCA, 100% stenosis of the RPDA, and 65% stenosis of the Prox-Mid LAD noted.  - PCI of the RCA was performed using a DES.  - Recent Lexiscan Myoview on 01/03/2016 showed no significant reversible ischemia.  - the 12 lead EKG showed TWI in the inferior leads and slight J point elevation in the anterior leads (new) which appears resolved on EKG 5/12.  - Trop negative x 3. - Continue ASA and Brilinta. - Added imdur per cardiology  - Per cardio, If pt has further CP not responsive to increases in long acting nitrates she may need LHC.  Dyslipidemia - continue statin therapy. LDL at goal at 35.   SignedManson Passey, MD  [facility] 02/29/2016, 11:12 AM  Pager #: [phone]  Time spent in minutes: more than 30 minutes  Procedures:  None   Consultations:  Cardiology   Discharge Exam: Filed Vitals:   02/29/16 0500 02/29/16 0828  BP: 113/74 116/73  Pulse: 67 70  Temp: 98 F (36.7 C) 98 F (36.7 C)  Resp: 18 16   Filed Vitals:   02/28/16 2045 02/29/16 0030 02/29/16 0500 02/29/16 0828  BP: 115/67 140/60 113/74 116/73  Pulse: 76 76 67 70  Temp: 97.6 F (36.4 C) 97.6 F (36.4 C) 98 F (36.7 C) 98 F (36.7 C)  TempSrc: Oral Oral Oral Oral  Resp: 16 16 18 16   Height:      Weight:   59.467 kg (131 lb 1.6 oz)   SpO2: 98% 95% 96% 97%    General: Pt is alert, follows commands appropriately, not in acute distress Cardiovascular:  Regular rate and rhythm, S1/S2 +, no murmurs Respiratory: Clear to auscultation bilaterally, no wheezing, no crackles, no rhonchi Abdominal: Soft, non tender, non distended, bowel sounds +, no guarding Extremities: no edema, no cyanosis, pulses palpable bilaterally DP and PT Neuro: Grossly nonfocal  Discharge Instructions  Discharge Instructions    Call MD for:  difficulty breathing, headache or visual disturbances    Complete by:  As directed      Call MD for:  persistant dizziness or light-headedness    Complete by:   As directed      Call MD for:  persistant nausea and vomiting    Complete by:  As directed      Call MD for:  severe uncontrolled pain    Complete by:  As directed      Diet - low sodium heart healthy    Complete by:  As directed      Discharge instructions    Complete by:  As directed   Please note new medication is isosorbide 30 mg daily on discharge.     Increase activity slowly    Complete by:  As directed             Medication List    TAKE these medications        acetaminophen 325 MG tablet  Commonly known as:  TYLENOL  Take 2 tablets (650 mg total) by mouth every 4 (four) hours as needed for headache or mild pain.     aspirin 81 MG chewable tablet  Chew 1 tablet (81 mg total) by mouth daily.     atorvastatin 40 MG tablet  Commonly known as:  LIPITOR  Take 40 mg by mouth daily.     carboxymethylcellulose 0.5 % Soln  Commonly known as:  REFRESH PLUS  Place 1 drop into both eyes 3 (three) times daily as needed (dry eyes).     isosorbide mononitrate 30 MG 24 hr tablet  Commonly known as:  IMDUR  Take 1 tablet (30 mg total) by mouth daily.     metoprolol tartrate 25 MG tablet  Commonly known as:  LOPRESSOR  Take 0.5 tablets (12.5 mg total) by mouth 2 (two) times daily.     multivitamin with minerals Tabs tablet  Take 1 tablet by mouth daily.     nitroGLYCERIN 0.4 MG SL tablet  Commonly known as:  NITROSTAT  Place 1 tablet (0.4 mg total) under the tongue every 5 (five) minutes as needed for chest pain.     OVER THE COUNTER MEDICATION  Take 1 tablet by mouth daily. Over the counter antihistamine     ticagrelor 90 MG Tabs tablet  Commonly known as:  BRILINTA  Take 1 tablet (90 mg total) by mouth 2 (two) times daily.     VITAMIN E PO  Take 1 capsule by mouth daily.           Follow-up Information    Schedule an appointment as soon as possible for a visit with Rogelia Boga, MD.   Specialty:  Internal Medicine   Why:  Follow up appt after  recent hospitalization   Contact information:   [address] Christena Flake [facility] [place] [address] [phone]        The results of significant diagnostics from this hospitalization (including imaging, microbiology, ancillary and laboratory) are listed below for reference.    Significant Diagnostic Studies: Dg Chest 2 View  02/28/2016  CLINICAL DATA:  Midchest pain today EXAM: CHEST  2 VIEW COMPARISON:  12/17/2015 FINDINGS: There is mild hyperinflation, unchanged. Heart size is normal. The lungs are clear. The pulmonary vasculature is normal. There is no pleural effusion. Hilar and mediastinal contours are unremarkable and unchanged. IMPRESSION: Mild hyperinflation.  No acute cardiopulmonary findings. Electronically Signed   By: Ellery Plunk M.D.   On: 02/28/2016 06:15    Microbiology: No results found for this or any previous visit (from the past 240 hour(s)).   Labs: Basic Metabolic Panel:  Recent Labs Lab 02/28/16 0528  NA 142  K 4.1  CL 108  CO2 25  GLUCOSE 110*  BUN 24*  CREATININE 1.00  CALCIUM 9.2   Liver Function Tests: No results for input(s): AST, ALT, ALKPHOS, BILITOT, PROT, ALBUMIN in the last 168 hours. No results for input(s): LIPASE, AMYLASE in the last 168 hours. No results for input(s): AMMONIA in the last 168 hours. CBC:  Recent Labs Lab 02/28/16 0528  WBC 4.6  HGB 11.7*  HCT 35.6*  MCV 96.7  PLT 199   Cardiac Enzymes:  Recent Labs Lab 02/28/16 1110 02/28/16 1624 02/28/16 2258  TROPONINI <0.03 <0.03 <0.03   BNP: BNP (last 3 results)  Recent Labs  12/15/15 1715  BNP 137.8*    ProBNP (last 3 results) No results for input(s): PROBNP in the last 8760 hours.  CBG: No results for input(s): GLUCAP in the last 168 hours.

## 2016-02-29 NOTE — Discharge Instructions (Signed)
Isosorbide Mononitrate extended-release tablets  What is this medicine?  ISOSORBIDE MONONITRATE (eye soe SOR bide mon oh NYE trate) is a vasodilator. It relaxes blood vessels, increasing the blood and oxygen supply to your heart. This medicine is used to prevent chest pain caused by angina. It will not help to stop an episode of chest pain.  This medicine may be used for other purposes; ask your health care provider or pharmacist if you have questions.  What should I tell my health care provider before I take this medicine?  They need to know if you have any of these conditions:  -previous heart attack or heart failure  -an unusual or allergic reaction to isosorbide mononitrate, nitrates, other medicines, foods, dyes, or preservatives  -pregnant or trying to get pregnant  -breast-feeding  How should I use this medicine?  Take this medicine by mouth with a glass of water. Follow the directions on the prescription label. Do not crush or chew. Take your medicine at regular intervals. Do not take your medicine more often than directed. Do not stop taking this medicine except on the advice of your doctor or health care professional.  Talk to your pediatrician regarding the use of this medicine in children. Special care may be needed.  Overdosage: If you think you have taken too much of this medicine contact a poison control center or emergency room at once.  NOTE: This medicine is only for you. Do not share this medicine with others.  What if I miss a dose?  If you miss a dose, take it as soon as you can. If it is almost time for your next dose, take only that dose. Do not take double or extra doses.  What may interact with this medicine?  Do not take this medicine with any of the following medications:  -medicines used to treat erectile dysfunction (ED) like avanafil, sildenafil, tadalafil, and vardenafil  -riociguat  This medicine may also interact with the following medications:  -medicines for high blood  pressure  -other medicines for angina or heart failure  This list may not describe all possible interactions. Give your health care provider a list of all the medicines, herbs, non-prescription drugs, or dietary supplements you use. Also tell them if you smoke, drink alcohol, or use illegal drugs. Some items may interact with your medicine.  What should I watch for while using this medicine?  Check your heart rate and blood pressure regularly while you are taking this medicine. Ask your doctor or health care professional what your heart rate and blood pressure should be and when you should contact him or her. Tell your doctor or health care professional if you feel your medicine is no longer working.  You may get dizzy. Do not drive, use machinery, or do anything that needs mental alertness until you know how this medicine affects you. To reduce the risk of dizzy or fainting spells, do not sit or stand up quickly, especially if you are an older patient. Alcohol can make you more dizzy, and increase flushing and rapid heartbeats. Avoid alcoholic drinks.  Do not treat yourself for coughs, colds, or pain while you are taking this medicine without asking your doctor or health care professional for advice. Some ingredients may increase your blood pressure.  What side effects may I notice from receiving this medicine?  Side effects that you should report to your doctor or health care professional as soon as possible:  -bluish discoloration of lips, fingernails, or palms of   hands  -irregular heartbeat, palpitations  -low blood pressure  -nausea, vomiting  -persistent headache  -unusually weak or tired  Side effects that usually do not require medical attention (report to your doctor or health care professional if they continue or are bothersome):  -flushing of the face or neck  -rash  This list may not describe all possible side effects. Call your doctor for medical advice about side effects. You may report side effects to  FDA at 1-800-FDA-1088.  Where should I keep my medicine?  Keep out of the reach of children.  Store between 15 and 30 degrees C (59 and 86 degrees F). Keep container tightly closed. Throw away any unused medicine after the expiration date.  NOTE: This sheet is a summary. It may not cover all possible information. If you have questions about this medicine, talk to your doctor, pharmacist, or health care provider.     © 2016, [employer]. (2013-08-05 14:48:19)

## 2016-02-29 NOTE — Progress Notes (Addendum)
SUBJECTIVE:  No futher CP  OBJECTIVE:   Vitals:   Filed Vitals:   02/28/16 2045 02/29/16 0030 02/29/16 0500 02/29/16 0828  BP: 115/67 140/60 113/74 116/73  Pulse: 76 76 67 70  Temp: 97.6 F (36.4 C) 97.6 F (36.4 C) 98 F (36.7 C) 98 F (36.7 C)  TempSrc: Oral Oral Oral Oral  Resp: 16 16 18 16   Height:      Weight:   131 lb 1.6 oz (59.467 kg)   SpO2: 98% 95% 96% 97%   I&O's:   Intake/Output Summary (Last 24 hours) at 02/29/16 1046 Last data filed at 02/29/16 0900  Gross per 24 hour  Intake    600 ml  Output    650 ml  Net    -50 ml   TELEMETRY: Reviewed telemetry pt in NSR:     PHYSICAL EXAM General: Well developed, well nourished, in no acute distress Head: Eyes PERRLA, No xanthomas.   Normal cephalic and atramatic  Lungs:   Clear bilaterally to auscultation and percussion. Heart:   HRRR S1 S2 Pulses are 2+ & equal. Abdomen: Bowel sounds are positive, abdomen soft and non-tender without masses  Extremities:   No clubbing, cyanosis or edema.  DP +1 Neuro: Alert and oriented X 3. Psych:  Good affect, responds appropriately   LABS: Basic Metabolic Panel:  Recent Labs  82/70/78 0528  NA 142  K 4.1  CL 108  CO2 25  GLUCOSE 110*  BUN 24*  CREATININE 1.00  CALCIUM 9.2   Liver Function Tests: No results for input(s): AST, ALT, ALKPHOS, BILITOT, PROT, ALBUMIN in the last 72 hours. No results for input(s): LIPASE, AMYLASE in the last 72 hours. CBC:  Recent Labs  02/28/16 0528  WBC 4.6  HGB 11.7*  HCT 35.6*  MCV 96.7  PLT 199   Cardiac Enzymes:  Recent Labs  02/28/16 1110 02/28/16 1624 02/28/16 2258  TROPONINI <0.03 <0.03 <0.03   BNP: Invalid input(s): POCBNP D-Dimer: No results for input(s): DDIMER in the last 72 hours. Hemoglobin A1C: No results for input(s): HGBA1C in the last 72 hours. Fasting Lipid Panel: No results for input(s): CHOL, HDL, LDLCALC, TRIG, CHOLHDL, LDLDIRECT in the last 72 hours. Thyroid Function Tests: No  results for input(s): TSH, T4TOTAL, T3FREE, THYROIDAB in the last 72 hours.  Invalid input(s): FREET3 Anemia Panel: No results for input(s): VITAMINB12, FOLATE, FERRITIN, TIBC, IRON, RETICCTPCT in the last 72 hours. Coag Panel:   No results found for: INR, PROTIME  RADIOLOGY: Dg Chest 2 View  02/28/2016  CLINICAL DATA:  Midchest pain today EXAM: CHEST  2 VIEW COMPARISON:  12/17/2015 FINDINGS: There is mild hyperinflation, unchanged. Heart size is normal. The lungs are clear. The pulmonary vasculature is normal. There is no pleural effusion. Hilar and mediastinal contours are unremarkable and unchanged. IMPRESSION: Mild hyperinflation.  No acute cardiopulmonary findings. Electronically Signed   By: Ellery Plunk M.D.   On: 02/28/2016 06:15   Assessment & Plan   1. Chest Pain/ History of CAD - s/p recent inferior STEMI in 11/2015 with 100% stenosis of the Prox RCA, 100% stenosis of the RPDA, and 65% stenosis of the Prox-Mid LAD noted. PCI of the RCA was performed using a DES. Underwent a recent Lexiscan Myoview on 01/03/2016 to assess for ischemia in the LAD territory which showed no significant reversible ischemia.  - has been experiencing an atypical, reproducible pain along her left pectoral region but reports the day of admission she developed a pressure in  her sternum which resembled her previous MI but was "milder". Associated with diaphoresis. No relief with SL NTG. Resolved within 30 minutes. Reports being under increased "personal stress" since the beginning of the year and is anxious she will have another MI.  - EKG shows TWI in the inferior leads (improved when compared to previous tracings) and slight J point elevation in the anterior leads (new) which appears resolved on EKG this am.  Trop negative x 3. - She reports good compliance with her ASA and Brilinta. - I have reviewed the cath films with Dr. Katrinka Blazing and at this time he recommends medical therapy. I have started her on  Imdur  daily.  She has been ambulating this am and has had no problems and is stable for d/c home. If she has further CP not responsive to increases in long acting nitrates she may need LHC. 2. HLD - continue statin therapy.  LDL at goal at 35.  She is stable from cardiac standpoint to return to Cardiac Rehab on Monday.         Nancy Magic, MD  02/29/2016  10:46 AM

## 2016-02-29 NOTE — Progress Notes (Signed)
Patient discharge paperwork gone over in detail with patient. All questions ordered to patient's satisfaction. Patient excited about returning to cardiac rehab on Monday. Irving Burton, from Cardiac rehab notified that patient can return Monday per Dr. Mayford Knife. Telemetry discontinued, IV removed intact. Patient discharged home with family by way of wheelchair.

## 2016-03-03 ENCOUNTER — Encounter (HOSPITAL_COMMUNITY)
Admission: RE | Admit: 2016-03-03 | Discharge: 2016-03-03 | Disposition: A | Discharge status: Home / Self Care | Payer: Medicare Other | Source: Ambulatory Visit | Attending: Interventional Cardiology | Admitting: Interventional Cardiology

## 2016-03-03 DIAGNOSIS — M8580 Other specified disorders of bone density and structure, unspecified site: Secondary | ICD-10-CM | POA: Diagnosis not present

## 2016-03-03 DIAGNOSIS — Z955 Presence of coronary angioplasty implant and graft: Secondary | ICD-10-CM

## 2016-03-03 DIAGNOSIS — I2510 Atherosclerotic heart disease of native coronary artery without angina pectoris: Secondary | ICD-10-CM | POA: Diagnosis not present

## 2016-03-03 DIAGNOSIS — I213 ST elevation (STEMI) myocardial infarction of unspecified site: Secondary | ICD-10-CM | POA: Diagnosis not present

## 2016-03-03 DIAGNOSIS — I2111 ST elevation (STEMI) myocardial infarction involving right coronary artery: Secondary | ICD-10-CM

## 2016-03-03 DIAGNOSIS — Z7982 Long term (current) use of aspirin: Secondary | ICD-10-CM | POA: Diagnosis not present

## 2016-03-03 DIAGNOSIS — Z79899 Other long term (current) drug therapy: Secondary | ICD-10-CM | POA: Diagnosis not present

## 2016-03-03 NOTE — Progress Notes (Signed)
Nancy Neal returned to cardiac rehab today today. I asked Nancy Neal to bring her medication bottles with her to exercise on Wednesday. Nancy Neal said she took her medication this morning but she may not have been taking it everyday because she says it has been giving her diarrhea. No complaints with exercise today. Will continue to monitor the patient throughout  the program.

## 2016-03-05 ENCOUNTER — Ambulatory Visit (INDEPENDENT_AMBULATORY_CARE_PROVIDER_SITE_OTHER): Payer: Medicare Other | Admitting: Internal Medicine

## 2016-03-05 ENCOUNTER — Encounter (HOSPITAL_COMMUNITY)
Admission: RE | Admit: 2016-03-05 | Discharge: 2016-03-05 | Disposition: A | Discharge status: Home / Self Care | Payer: Medicare Other | Source: Ambulatory Visit | Attending: Interventional Cardiology | Admitting: Interventional Cardiology

## 2016-03-05 ENCOUNTER — Encounter: Payer: Self-pay | Admitting: Internal Medicine

## 2016-03-05 VITALS — BP 110/60 | HR 73 | Temp 98.1°F | Resp 20 | Ht 65.0 in | Wt 133.0 lb

## 2016-03-05 DIAGNOSIS — Z7982 Long term (current) use of aspirin: Secondary | ICD-10-CM | POA: Diagnosis not present

## 2016-03-05 DIAGNOSIS — E785 Hyperlipidemia, unspecified: Secondary | ICD-10-CM

## 2016-03-05 DIAGNOSIS — I10 Essential (primary) hypertension: Secondary | ICD-10-CM

## 2016-03-05 DIAGNOSIS — Z79899 Other long term (current) drug therapy: Secondary | ICD-10-CM | POA: Diagnosis not present

## 2016-03-05 DIAGNOSIS — I2510 Atherosclerotic heart disease of native coronary artery without angina pectoris: Secondary | ICD-10-CM

## 2016-03-05 DIAGNOSIS — Z955 Presence of coronary angioplasty implant and graft: Secondary | ICD-10-CM | POA: Diagnosis not present

## 2016-03-05 DIAGNOSIS — H401134 Primary open-angle glaucoma, bilateral, indeterminate stage: Secondary | ICD-10-CM | POA: Diagnosis not present

## 2016-03-05 DIAGNOSIS — M8580 Other specified disorders of bone density and structure, unspecified site: Secondary | ICD-10-CM | POA: Diagnosis not present

## 2016-03-05 DIAGNOSIS — I213 ST elevation (STEMI) myocardial infarction of unspecified site: Secondary | ICD-10-CM | POA: Diagnosis not present

## 2016-03-05 DIAGNOSIS — I241 Dressler's syndrome: Secondary | ICD-10-CM

## 2016-03-05 NOTE — Progress Notes (Signed)
Subjective:    Patient ID: Nancy Neal, female    DOB: [birth year], 82 y.o.   MRN: [id number]  HPI  Admit date: 02/28/2016 Discharge date: 02/29/2016  Recommendations for Outpatient Follow-up:  1. Check CBC and BMP  Discharge Diagnoses:  Principal Problem:  Chest pain Active Problems:  Hyperlipidemia  CAD S/P PCI 12/15/15  CAD - residual LAD disease  Stress at home   82 year old patient who was admitted last week briefly for chest pain.  She has a history of coronary artery disease and is status post ST segment elevation MI followed by PCI in February of this year.  She continues to do quite well but is very anxious about her cardiac condition.  She has residual 60% lesion that she is very concerned about.  This was discussed at length and she is followed closely by cardiology.  She has no angina.  Hospital records reviewed.  New medications include isosorbide , mononitrate  Past Medical History  Diagnosis Date  . ALLERGIC RHINITIS 06/02/2007  . OSTEOPENIA 06/02/2007  . VAGINITIS, ATROPHIC, POSTMENOPAUSAL 12/06/2008  . VERTIGO 02/16/2009  . Glaucoma 1-12    open angle  . Coronary artery disease 11/2015    a. 11/2015: Inferior STEMI w/ Promus DES to RCA.    . Diverticulosis of colon 2003    by 2003 and 2008 colonoscopy.   Marland Kitchen History of blood transfusion 1971    "when I had hysterectomy"  . GERD (gastroesophageal reflux disease)   . Arthritis     "maybe in my right knee" (12/19/2015)  . Myocardial infarction [facility]) 11/2015     Social History   Social History  . Marital Status: Widowed    Spouse Name: N/A  . Number of Children: N/A  . Years of Education: N/A   Occupational History  . Not on file.   Social History Main Topics  . Smoking status: Never Smoker   . Smokeless tobacco: Never Used  . Alcohol Use: 4.2 oz/week    7 Glasses of wine per week  . Drug Use: No  . Sexual Activity: No   Other Topics Concern  . Not on file   Social History Narrative     Past Surgical History  Procedure Laterality Date  . Cardiac catheterization N/A 12/15/2015    Procedure: Left Heart Cath and Coronary Angiography;  Surgeon: Lyn Records, MD;  Location: [facility] INVASIVE CV LAB;  Service: Cardiovascular;  Laterality: N/A;  . Cardiac catheterization N/A 12/15/2015    Procedure: Coronary Stent Intervention;  Surgeon: Lyn Records, MD;  Location: [facility] INVASIVE CV LAB;  Service: Cardiovascular;  Laterality: N/A;  . Shoulder arthroscopy w/ rotator cuff repair Right 11/2014  . Appendectomy  1971  . Total abdominal hysterectomy  1971  . Hemorrhoid surgery  ~ 1968  . Eye surgery Bilateral     "laser tx for glaucoma"  . Colonoscopy N/A 12/20/2015    Procedure: COLONOSCOPY;  Surgeon: Hilarie Fredrickson, MD;  Location: [facility] ENDOSCOPY;  Service: Endoscopy;  Laterality: N/A;    Family History  Problem Relation Age of Onset  . Suicidality Mother   . Heart disease Father   . Arthritis Brother     Allergies  Allergen Reactions  . Codeine Sulfate Nausea And Vomiting  . Meclizine Other (See Comments)    glaucoma    Current Outpatient Prescriptions on File Prior to Visit  Medication Sig Dispense Refill  . acetaminophen (TYLENOL) 325 MG tablet Take 2 tablets (650 mg total) by  mouth every 4 (four) hours as needed for headache or mild pain.    Marland Kitchen aspirin 81 MG chewable tablet Chew 1 tablet (81 mg total) by mouth daily.    Marland Kitchen atorvastatin (LIPITOR) 40 MG tablet Take 40 mg by mouth daily.    . carboxymethylcellulose (REFRESH PLUS) 0.5 % SOLN Place 1 drop into both eyes 3 (three) times daily as needed (dry eyes).     . isosorbide mononitrate (IMDUR) 30 MG 24 hr tablet Take 1 tablet (30 mg total) by mouth daily. 30 tablet 0  . metoprolol tartrate (LOPRESSOR) 25 MG tablet Take 0.5 tablets (12.5 mg total) by mouth 2 (two) times daily. 90 tablet 3  . Multiple Vitamin (MULTIVITAMIN WITH MINERALS) TABS tablet Take 1 tablet by mouth daily.    . nitroGLYCERIN (NITROSTAT) 0.4 MG SL tablet  Place 1 tablet (0.4 mg total) under the tongue every 5 (five) minutes as needed for chest pain. 25 tablet 2  . OVER THE COUNTER MEDICATION Take 1 tablet by mouth daily. Over the counter antihistamine    . ticagrelor (BRILINTA) 90 MG TABS tablet Take 1 tablet (90 mg total) by mouth 2 (two) times daily. 180 tablet 3  . VITAMIN E PO Take 1 capsule by mouth daily.      No current facility-administered medications on file prior to visit.    BP 110/60 mmHg  Pulse 73  Temp(Src) 98.1 F (36.7 C) (Oral)  Resp 20  Ht 5\' 5"  (1.651 m)  Wt 133 lb (60.328 kg)  BMI 22.13 kg/m2  SpO2 98%      Review of Systems  Constitutional: Positive for fatigue.  HENT: Negative for congestion, dental problem, hearing loss, rhinorrhea, sinus pressure, sore throat and tinnitus.   Eyes: Negative for pain, discharge and visual disturbance.  Respiratory: Negative for cough and shortness of breath.   Cardiovascular: Negative for chest pain, palpitations and leg swelling.  Gastrointestinal: Negative for nausea, vomiting, abdominal pain, diarrhea, constipation, blood in stool and abdominal distention.  Genitourinary: Negative for dysuria, urgency, frequency, hematuria, flank pain, vaginal bleeding, vaginal discharge, difficulty urinating, vaginal pain and pelvic pain.  Musculoskeletal: Negative for joint swelling, arthralgias and gait problem.  Skin: Negative for rash.  Neurological: Negative for dizziness, syncope, speech difficulty, weakness, numbness and headaches.  Hematological: Negative for adenopathy.  Psychiatric/Behavioral: Negative for behavioral problems, dysphoric mood and agitation. The patient is not nervous/anxious.        Objective:   Physical Exam  Constitutional: She is oriented to person, place, and time. She appears well-developed and well-nourished.  HENT:  Head: Normocephalic.  Right Ear: External ear normal.  Left Ear: External ear normal.  Mouth/Throat: Oropharynx is clear and moist.   Eyes: Conjunctivae and EOM are normal. Pupils are equal, round, and reactive to light.  Neck: Normal range of motion. Neck supple. No thyromegaly present.  Cardiovascular: Normal rate, regular rhythm, normal heart sounds and intact distal pulses.   Pedal pulses full  Pulmonary/Chest: Effort normal and breath sounds normal.  Abdominal: Soft. Bowel sounds are normal. She exhibits no mass. There is no tenderness.  Musculoskeletal: Normal range of motion.  Lymphadenopathy:    She has no cervical adenopathy.  Neurological: She is alert and oriented to person, place, and time.  Skin: Skin is warm and dry. No rash noted.  Psychiatric: She has a normal mood and affect. Her behavior is normal.          Assessment & Plan:   Coronary artery disease Situational anxiety.  Her favorable prognosis.  Discussed at length.  Importance of aggressive risk factor modification discussed and encouraged.  Patient is very compliant History of essential hypertension History of dyslipidemia.  Continue statin therapy Follow-up cardiology

## 2016-03-05 NOTE — Progress Notes (Signed)
Pre visit review using our clinic review tool, if applicable. No additional management support is needed unless otherwise documented below in the visit note. 

## 2016-03-05 NOTE — Patient Instructions (Signed)
Follow-up cardiology  Continue heart healthy diet  Gradually increase your better level of activity  Call or return to clinic prn if these symptoms worsen or fail to improve as anticipated.

## 2016-03-05 NOTE — Progress Notes (Signed)
Reviewed Nancy Neal's medication bottles. Nancy Neal is taking her medication as prescribed. Nancy Neal had a bottle of Advil in her  Medication bag. I advised the patient take her tylenol and not to take Advil if she needed to take anything for a headache.

## 2016-03-06 ENCOUNTER — Other Ambulatory Visit: Payer: Self-pay | Admitting: Internal Medicine

## 2016-03-06 NOTE — Telephone Encounter (Signed)
Dr. Kirtland Bouchard, okay for pt to continue medication? Please advise.

## 2016-03-06 NOTE — Telephone Encounter (Signed)
Pt request refill of the following:  isosorbide mononitrate (IMDUR) 30 MG 24 hr tablet  Pt saw Dr Kirtland Bouchard yesterday and for got to ask him if she should continue taking the above medicine,if so she will need a refill      Phamacy:  [facility] [address]

## 2016-03-07 ENCOUNTER — Encounter (HOSPITAL_COMMUNITY)
Admission: RE | Admit: 2016-03-07 | Discharge: 2016-03-07 | Disposition: A | Discharge status: Home / Self Care | Payer: Medicare Other | Source: Ambulatory Visit | Attending: Interventional Cardiology | Admitting: Interventional Cardiology

## 2016-03-07 DIAGNOSIS — Z79899 Other long term (current) drug therapy: Secondary | ICD-10-CM | POA: Diagnosis not present

## 2016-03-07 DIAGNOSIS — Z955 Presence of coronary angioplasty implant and graft: Secondary | ICD-10-CM

## 2016-03-07 DIAGNOSIS — I2111 ST elevation (STEMI) myocardial infarction involving right coronary artery: Secondary | ICD-10-CM

## 2016-03-07 DIAGNOSIS — I2510 Atherosclerotic heart disease of native coronary artery without angina pectoris: Secondary | ICD-10-CM | POA: Diagnosis not present

## 2016-03-07 DIAGNOSIS — I213 ST elevation (STEMI) myocardial infarction of unspecified site: Secondary | ICD-10-CM | POA: Diagnosis not present

## 2016-03-07 DIAGNOSIS — Z7982 Long term (current) use of aspirin: Secondary | ICD-10-CM | POA: Diagnosis not present

## 2016-03-07 DIAGNOSIS — M8580 Other specified disorders of bone density and structure, unspecified site: Secondary | ICD-10-CM | POA: Diagnosis not present

## 2016-03-07 MED ORDER — ISOSORBIDE MONONITRATE ER 30 MG PO TB24: 30.0000 mg | ORAL_TABLET | Freq: Every day | ORAL | Status: DC

## 2016-03-07 NOTE — Telephone Encounter (Signed)
Yes.  Continue medication refill okay

## 2016-03-07 NOTE — Telephone Encounter (Signed)
Rx done. 

## 2016-03-10 ENCOUNTER — Encounter (HOSPITAL_COMMUNITY)
Admission: RE | Admit: 2016-03-10 | Discharge: 2016-03-10 | Disposition: A | Discharge status: Home / Self Care | Payer: Medicare Other | Source: Ambulatory Visit | Attending: Interventional Cardiology | Admitting: Interventional Cardiology

## 2016-03-10 ENCOUNTER — Telehealth: Payer: Self-pay | Admitting: Interventional Cardiology

## 2016-03-10 DIAGNOSIS — Z7982 Long term (current) use of aspirin: Secondary | ICD-10-CM | POA: Diagnosis not present

## 2016-03-10 DIAGNOSIS — I2510 Atherosclerotic heart disease of native coronary artery without angina pectoris: Secondary | ICD-10-CM | POA: Diagnosis not present

## 2016-03-10 DIAGNOSIS — I2111 ST elevation (STEMI) myocardial infarction involving right coronary artery: Secondary | ICD-10-CM

## 2016-03-10 DIAGNOSIS — I213 ST elevation (STEMI) myocardial infarction of unspecified site: Secondary | ICD-10-CM | POA: Diagnosis not present

## 2016-03-10 DIAGNOSIS — Z955 Presence of coronary angioplasty implant and graft: Secondary | ICD-10-CM

## 2016-03-10 DIAGNOSIS — M8580 Other specified disorders of bone density and structure, unspecified site: Secondary | ICD-10-CM | POA: Diagnosis not present

## 2016-03-10 DIAGNOSIS — Z79899 Other long term (current) drug therapy: Secondary | ICD-10-CM | POA: Diagnosis not present

## 2016-03-10 NOTE — Telephone Encounter (Signed)
I spoke with Byrd Hesselbach from [facility].  Byrd Hesselbach states pt reports daily headache since discharge from hospital 02/29/16. Byrd Hesselbach states pt was discharged on Imdur 30mg  daily, this is a new medication for pt. Byrd Hesselbach states pt taking tylenol with relief for headache. Nancy Neal states resting BP 110/71 HR 79 today at [facility].   Byrd Hesselbach advised I will forward to Dr Katrinka Blazing for review.  Byrd Hesselbach advised we will follow up with pt once we have Dr Michaelle Copas recommendations.

## 2016-03-10 NOTE — Progress Notes (Signed)
Nancy Neal reports that she has been having a headache almost on a daily basis since she has been taking Imdur. Nancy Neal has no complaints of a headache this morning. Will notify Dr Michaelle Copas office of Nancy Neal's complaints. Nancy Neal also reported that she only has a 30 day supply of her isosorbide. No problems with exercise today. Will continue to monitor the patient throughout  the program.

## 2016-03-11 NOTE — Telephone Encounter (Signed)
Continue therapy unless symptoms prevent continuing.

## 2016-03-11 NOTE — Progress Notes (Signed)
Cardiac Individual Treatment Plan  Patient Details  Name: Nancy Neal MRN: [id number] Date of Birth: November 27, 1931 Referring Provider:        CARDIAC REHAB PHASE II ORIENTATION from 01/24/2016 in [facility] CARDIAC REHAB   Referring Provider  Verdis Prime MD      Initial Encounter Date:       CARDIAC REHAB PHASE II ORIENTATION from 01/24/2016 in [facility] CARDIAC REHAB   Date  01/24/16   Referring Provider  Verdis Prime MD      Visit Diagnosis: ST elevation (STEMI) myocardial infarction involving right coronary artery [facility])  Status post insertion of drug-eluting stent into right coronary artery for coronary artery disease  Patient's Home Medications on Admission:  Current outpatient prescriptions:  .  acetaminophen (TYLENOL) 325 MG tablet, Take 2 tablets (650 mg total) by mouth every 4 (four) hours as needed for headache or mild pain., Disp: , Rfl:  .  aspirin 81 MG chewable tablet, Chew 1 tablet (81 mg total) by mouth daily., Disp: , Rfl:  .  atorvastatin (LIPITOR) 40 MG tablet, Take 40 mg by mouth daily., Disp: , Rfl:  .  carboxymethylcellulose (REFRESH PLUS) 0.5 % SOLN, Place 1 drop into both eyes 3 (three) times daily as needed (dry eyes). , Disp: , Rfl:  .  isosorbide mononitrate (IMDUR) 30 MG 24 hr tablet, Take 1 tablet (30 mg total) by mouth daily., Disp: 30 tablet, Rfl: 0 .  metoprolol tartrate (LOPRESSOR) 25 MG tablet, Take 0.5 tablets (12.5 mg total) by mouth 2 (two) times daily., Disp: 90 tablet, Rfl: 3 .  Multiple Vitamin (MULTIVITAMIN WITH MINERALS) TABS tablet, Take 1 tablet by mouth daily., Disp: , Rfl:  .  nitroGLYCERIN (NITROSTAT) 0.4 MG SL tablet, Place 1 tablet (0.4 mg total) under the tongue every 5 (five) minutes as needed for chest pain., Disp: 25 tablet, Rfl: 2 .  OVER THE COUNTER MEDICATION, Take 1 tablet by mouth daily. Over the counter antihistamine, Disp: , Rfl:  .  ticagrelor (BRILINTA) 90 MG TABS tablet, Take 1 tablet (90  mg total) by mouth 2 (two) times daily., Disp: 180 tablet, Rfl: 3 .  VITAMIN E PO, Take 1 capsule by mouth daily. , Disp: , Rfl:   Past Medical History: Past Medical History  Diagnosis Date  . ALLERGIC RHINITIS 06/02/2007  . OSTEOPENIA 06/02/2007  . VAGINITIS, ATROPHIC, POSTMENOPAUSAL 12/06/2008  . VERTIGO 02/16/2009  . Glaucoma 1-12    open angle  . Coronary artery disease 11/2015    a. 11/2015: Inferior STEMI w/ Promus DES to RCA.    . Diverticulosis of colon 2003    by 2003 and 2008 colonoscopy.   Marland Kitchen History of blood transfusion 1971    "when I had hysterectomy"  . GERD (gastroesophageal reflux disease)   . Arthritis     "maybe in my right knee" (12/19/2015)  . Myocardial infarction (HCC) 11/2015    Tobacco Use: History  Smoking status  . Never Smoker   Smokeless tobacco  . Never Used    Labs:     Recent Review Flowsheet Data    Labs for ITP Cardiac and Pulmonary Rehab Latest Ref Rng 12/12/2009 12/18/2011 01/04/2014 12/15/2015 02/15/2016   Cholestrol 125 - 200 mg/dL 409(W) 119(J) 478(G) 956 118(L)   LDLCALC <130 mg/dL - - 213(Y) 84 35   LDLDIRECT - 144.6 123.2 - - -   HDL >=46 mg/dL 86.57 84.69 62.95 61 55   Trlycerides <150 mg/dL 284.1 86.0  114.0 112 140      Capillary Blood Glucose: Lab Results  Component Value Date   GLUCAP 92 12/20/2015   GLUCAP 96 12/19/2015     Exercise Target Goals:    Exercise Program Goal: Individual exercise prescription set with THRR, safety & activity barriers. Participant demonstrates ability to understand and report RPE using BORG scale, to self-measure pulse accurately, and to acknowledge the importance of the exercise prescription.  Exercise Prescription Goal: Starting with aerobic activity 30 plus minutes a day, 3 days per week for initial exercise prescription. Provide home exercise prescription and guidelines that participant acknowledges understanding prior to discharge.  Activity Barriers & Risk Stratification:     Activity  Barriers & Cardiac Risk Stratification - 01/24/16 0854    Activity Barriers & Cardiac Risk Stratification   Activity Barriers Arthritis   Cardiac Risk Stratification High      6 Minute Walk:     6 Minute Walk      01/24/16 0952       6 Minute Walk   Phase Initial     Distance 1535 feet     Walk Time 6 minutes     # of Rest Breaks 0     MPH 2.91     METS 2.73     RPE 7     VO2 Peak 9.57     Symptoms No     Resting HR 72 bpm     Resting BP 124/66 mmHg     Max Ex. HR 79 bpm     Max Ex. BP 126/64 mmHg     2 Minute Post BP 116/64 mmHg        Initial Exercise Prescription:     Initial Exercise Prescription - 01/31/16 1300    Date of Initial Exercise RX and Referring Provider   Date 01/24/16   Referring Provider Verdis Prime MD   Treadmill   MPH 2   Grade 1   Minutes 10   METs 2.81   Bike   Level 0.6   Minutes 10   METs 2.73   NuStep   Level 2   Minutes 10   METs 2   Prescription Details   Frequency (times per week) 3   Intensity   THRR 40-80% of Max Heartrate 86-111   Ratings of Perceived Exertion 11-13   Progression   Progression Continue to progress workloads to maintain intensity without signs/symptoms of physical distress.   Resistance Training   Training Prescription Yes   Weight 2lbs   Reps 10-12      Perform Capillary Blood Glucose checks as needed.  Exercise Prescription Changes:      Exercise Prescription Changes      02/04/16 1000 02/05/16 1000 02/19/16 0900 03/05/16 1200     Exercise Review   Progression  Yes Yes Yes    Response to Exercise   Blood Pressure (Admit)  102/58 mmHg 114/70 mmHg 102/76 mmHg    Blood Pressure (Exercise)  138/80 mmHg 130/62 mmHg 140/60 mmHg    Blood Pressure (Exit)  100/60 mmHg 104/62 mmHg 102/62 mmHg    Heart Rate (Admit)  74 bpm 72 bpm 77 bpm    Heart Rate (Exercise)  91 bpm 104 bpm 105 bpm    Heart Rate (Exit)  74 bpm 73 bpm 77 bpm    Rating of Perceived Exertion (Exercise)  Symptoms   none none none    Comments Home Exercise Given 02/04/16 Home  Exercise Given 02/04/16 Home Exercise Given 02/04/16 Home Exercise Given 02/04/16    Duration  Progress to 30 minutes of continuous aerobic without signs/symptoms of physical distress Progress to 30 minutes of continuous aerobic without signs/symptoms of physical distress Progress to 30 minutes of continuous aerobic without signs/symptoms of physical distress    Intensity  THRR unchanged THRR unchanged THRR unchanged    Progression   Progression  Continue to progress workloads to maintain intensity without signs/symptoms of physical distress. Continue to progress workloads to maintain intensity without signs/symptoms of physical distress. Continue to progress workloads to maintain intensity without signs/symptoms of physical distress.    Average METs  2.5 3.3 4.1    Resistance Training   Training Prescription Yes Yes Yes Yes    Weight 2lbs 2lbs 2lbs 2lbs    Reps 10-12 10-12 10-12 10-12    Interval Training   Interval Training   No No    Treadmill   MPH 2 2 2.5 2.5    Grade 1 1 3 4     Minutes 10 10 10 10     METs 2.81 2.81 3.95 4.29    Bike   Level 0.6 1 1 1     Minutes 10 10 10 10     METs 2.73 2.87 2.88 4.13    NuStep   Level 2 2 3 3     Minutes 10 10 10 10     METs 2 1.8 3.1 4    Home Exercise Plan   Plans to continue exercise at [employer] (comment)  [employer] and walking at home [employer] (comment)  [employer] and walking at home [employer] (comment)  [employer] and walking at home [employer] (comment)    Frequency Add 3 additional days to program exercise sessions. Add 3 additional days to program exercise sessions. Add 3 additional days to program exercise sessions. Add 3 additional days to program exercise sessions.       Exercise Comments:      Exercise Comments      02/05/16 1054 02/19/16 0955 03/05/16 1239       Exercise Comments Pt is tolerating exercise well and continues to make progress.   During home exercise review, she mentioned that she works out harder at [employer]  She did state that her walk test was not much effort.  I told her that we would work on increasing her workloads. Pt is doing well with workload increases for exercise and is finding it more challenging. Pt is walking 30 minutes 3 days per week outside of [facility]. Plans to return to gym when she walk uphill without SOB.        Discharge Exercise Prescription (Final Exercise Prescription Changes):     Exercise Prescription Changes - 03/05/16 1200    Exercise Review   Progression Yes   Response to Exercise   Blood Pressure (Admit) 102/76 mmHg   Blood Pressure (Exercise) 140/60 mmHg   Blood Pressure (Exit) 102/62 mmHg   Heart Rate (Admit) 77 bpm   Heart Rate (Exercise) 105 bpm   Heart Rate (Exit) 77 bpm   Rating of Perceived Exertion (Exercise) 13   Symptoms none   Comments Home Exercise Given 02/04/16   Duration Progress to 30 minutes of continuous aerobic without signs/symptoms of physical distress   Intensity THRR unchanged   Progression   Progression Continue to progress workloads to maintain intensity without signs/symptoms of physical distress.   Average METs 4.1   Resistance Training   Training Prescription Yes  Weight 2lbs   Reps 10-12   Interval Training   Interval Training No   Treadmill   MPH 2.5   Grade 4   Minutes 10   METs 4.29   Bike   Level 1   Minutes 10   METs 4.13   NuStep   Level 3   Minutes 10   METs 4   Home Exercise Plan   Plans to continue exercise at [facility] (comment)   Frequency Add 3 additional days to program exercise sessions.      Nutrition:  Target Goals: Understanding of nutrition guidelines, daily intake of sodium 1500mg , cholesterol 200mg , calories 30% from fat and 7% or less from saturated fats, daily to have 5 or more servings of fruits and vegetables.  Biometrics:     Pre Biometrics - 01/24/16 0954    Pre Biometrics   Height 5\' 5"  (1.651  m)   Weight 132 lb 11.5 oz (60.2 kg)   Waist Circumference 27 inches   Hip Circumference 36 inches   Waist to Hip Ratio 0.75 %   BMI (Calculated) 22.1   Triceps Skinfold 20 mm   % Body Fat 32.3 %   Grip Strength 25 kg   Flexibility 15 in   Single Leg Stand 26.43 seconds       Nutrition Therapy Plan and Nutrition Goals:     Nutrition Therapy & Goals - 01/24/16 1420    Nutrition Therapy   Diet Therapeutic Lifestyle Changes   Personal Nutrition Goals   Personal Goal #1 Maintain current wt around 132 lb   Intervention Plan   Intervention Prescribe, educate and counsel regarding individualized specific dietary modifications aiming towards targeted core components such as weight, hypertension, lipid management, diabetes, heart failure and other comorbidities.   Expected Outcomes [place] Term Goal: Understand basic principles of dietary content, such as calories, fat, sodium, cholesterol and nutrients.;Long Term Goal: Adherence to prescribed nutrition plan.      Nutrition Discharge: Nutrition Scores:     Nutrition Assessments - 02/08/16 1052    MEDFICTS Scores   Pre Score 37      Nutrition Goals Re-Evaluation:   Psychosocial: Target Goals: Acknowledge presence or absence of depression, maximize coping skills, provide positive support system. Participant is able to verbalize types and ability to use techniques and skills needed for reducing stress and depression.  Initial Review & Psychosocial Screening:     Initial Psych Review & Screening - 01/30/16 1635    Initial Review   Current issues with Current Stress Concerns   Comments Current stress concerns related to family dynamics with daughter who lives out of state   Family Dynamics   Good Support System? Yes   Concerns Inappropriate over/under dependence on family/friends  Ms Schinke is not on good speaking terms with her daughter in [place], lives with son here in [place]   Comments Patient has good family support and  friends here in [place].   Barriers   Psychosocial barriers to participate in program The patient should benefit from training in stress management and relaxation.   Screening Interventions   Interventions Encouraged to exercise;Other (comment)   Comments Patient offered to meet with Theda Belfast the hospital chaplain. Ms Deike refused at this time. Will conitnue to offer emotional support.      Quality of Life Scores:     Quality of Life - 01/24/16 1131    Quality of Life Scores   Health/Function Pre 28.62 %   Socioeconomic Pre 29.58 %  Psych/Spiritual Pre 27.43 %   Family Pre 26.38 %   GLOBAL Pre 28.23 %      PHQ-9:     Recent Review Flowsheet Data    Depression screen [facility] 2/9 01/30/2016 01/15/2016 01/08/2015 01/04/2014   Decreased Interest 0 0 0 0   Down, Depressed, Hopeless 0 0 0 0   PHQ - 2 Score 0 0 0 0      Psychosocial Evaluation and Intervention:   Psychosocial Re-Evaluation:     Psychosocial Re-Evaluation      02/11/16 1058 03/11/16 0939         Psychosocial Re-Evaluation   Interventions Encouraged to attend Cardiac Rehabilitation for the exercise;Stress management education Encouraged to attend Cardiac Rehabilitation for the exercise      Comments  Patient reports she has had contact with her daughter which makes her happy      Continued Psychosocial Services Needed No No         Vocational Rehabilitation: Provide vocational rehab assistance to qualifying candidates.   Vocational Rehab Evaluation & Intervention:     Vocational Rehab - 01/24/16 1229    Initial Vocational Rehab Evaluation & Intervention   Assessment shows need for Vocational Rehabilitation No      Education: Education Goals: Education classes will be provided on a weekly basis, covering required topics. Participant will state understanding/return demonstration of topics presented.  Learning Barriers/Preferences:     Learning Barriers/Preferences - 01/24/16 1013     Learning Barriers/Preferences   Learning Barriers Sight;Hearing  nerve damage left ear   Learning Preferences Written Material;Group Instruction;Individual Instruction      Education Topics: Count Your Pulse:  -Group instruction provided by verbal instruction, demonstration, patient participation and written materials to support subject.  Instructors address importance of being able to find your pulse and how to count your pulse when at home without a heart monitor.  Patients get hands on experience counting their pulse with staff help and individually.      CARDIAC REHAB PHASE II EXERCISE from 03/05/2016 in [facility] CARDIAC REHAB   Date  02/22/16   Educator  Deveron Furlong   Instruction Review Code  2- meets goals/outcomes      Heart Attack, Angina, and Risk Factor Modification:  -Group instruction provided by verbal instruction, video, and written materials to support subject.  Instructors address signs and symptoms of angina and heart attacks.    Also discuss risk factors for heart disease and how to make changes to improve heart health risk factors.      CARDIAC REHAB PHASE II EXERCISE from 03/05/2016 in [facility] CARDIAC REHAB   Date  02/20/16   Instruction Review Code  2- meets goals/outcomes      Functional Fitness:  -Group instruction provided by verbal instruction, demonstration, patient participation, and written materials to support subject.  Instructors address safety measures for doing things around the house.  Discuss how to get up and down off the floor, how to pick things up properly, how to safely get out of a chair without assistance, and balance training.      CARDIAC REHAB PHASE II EXERCISE from 03/05/2016 in [facility] CARDIAC REHAB   Date  02/08/16   Instruction Review Code  2- meets goals/outcomes      Meditation and Mindfulness:  -Group instruction provided by verbal instruction, patient participation, and  written materials to support subject.  Instructor addresses importance of mindfulness and meditation practice to help reduce  stress and improve awareness.  Instructor also leads participants through a meditation exercise.    Stretching for Flexibility and Mobility:  -Group instruction provided by verbal instruction, patient participation, and written materials to support subject.  Instructors lead participants through series of stretches that are designed to increase flexibility thus improving mobility.  These stretches are additional exercise for major muscle groups that are typically performed during regular warm up and cool down.   Hands Only CPR Anytime:  -Group instruction provided by verbal instruction, video, patient participation and written materials to support subject.  Instructors co-teach with AHA video for hands only CPR.  Participants get hands on experience with mannequins.   Nutrition I class: Heart Healthy Eating:  -Group instruction provided by PowerPoint slides, verbal discussion, and written materials to support subject matter. The instructor gives an explanation and review of the Therapeutic Lifestyle Changes diet recommendations, which includes a discussion on lipid goals, dietary fat, sodium, fiber, plant stanol/sterol esters, sugar, and the components of a well-balanced, healthy diet.   Nutrition II class: Lifestyle Skills:  -Group instruction provided by PowerPoint slides, verbal discussion, and written materials to support subject matter. The instructor gives an explanation and review of label reading, grocery shopping for heart health, heart healthy recipe modifications, and ways to make healthier choices when eating out.      CARDIAC REHAB PHASE II EXERCISE from 03/05/2016 in [facility] CARDIAC REHAB   Date  02/26/16   Educator  RD   Instruction Review Code  2- meets goals/outcomes      Diabetes Question & Answer:  -Group instruction provided by  PowerPoint slides, verbal discussion, and written materials to support subject matter. The instructor gives an explanation and review of diabetes co-morbidities, pre- and post-prandial blood glucose goals, pre-exercise blood glucose goals, signs, symptoms, and treatment of hypoglycemia and hyperglycemia, and foot care basics.   Diabetes Blitz:  -Group instruction provided by PowerPoint slides, verbal discussion, and written materials to support subject matter. The instructor gives an explanation and review of the physiology behind type 1 and type 2 diabetes, diabetes medications and rational behind using different medications, pre- and post-prandial blood glucose recommendations and Hemoglobin A1c goals, diabetes diet, and exercise including blood glucose guidelines for exercising safely.    Portion Distortion:  -Group instruction provided by PowerPoint slides, verbal discussion, written materials, and food models to support subject matter. The instructor gives an explanation of serving size versus portion size, changes in portions sizes over the last 20 years, and what consists of a serving from each food group.      CARDIAC REHAB PHASE II EXERCISE from 03/05/2016 in [facility] CARDIAC REHAB   Date  02/13/16   Educator  RD   Instruction Review Code  2- meets goals/outcomes      Stress Management:  -Group instruction provided by verbal instruction, video, and written materials to support subject matter.  Instructors review role of stress in heart disease and how to cope with stress positively.        CARDIAC REHAB PHASE II EXERCISE from 03/05/2016 in [facility] CARDIAC REHAB   Date  02/06/16   Instruction Review Code  2- meets goals/outcomes      Exercising on Your Own:  -Group instruction provided by verbal instruction, power point, and written materials to support subject.  Instructors discuss benefits of exercise, components of exercise, frequency and  intensity of exercise, and end points for exercise.  Also discuss use  of nitroglycerin and activating EMS.  Review options of places to exercise outside of rehab.  Review guidelines for sex with heart disease.   Cardiac Drugs I:  -Group instruction provided by verbal instruction and written materials to support subject.  Instructor reviews cardiac drug classes: antiplatelets, anticoagulants, beta blockers, and statins.  Instructor discusses reasons, side effects, and lifestyle considerations for each drug class.      CARDIAC REHAB PHASE II EXERCISE from 03/05/2016 in [facility] CARDIAC REHAB   Date  01/30/16   Educator  Pharm D   Instruction Review Code  2- meets goals/outcomes      Cardiac Drugs II:  -Group instruction provided by verbal instruction and written materials to support subject.  Instructor reviews cardiac drug classes: angiotensin converting enzyme inhibitors (ACE-I), angiotensin II receptor blockers (ARBs), nitrates, and calcium channel blockers.  Instructor discusses reasons, side effects, and lifestyle considerations for each drug class.   Anatomy and Physiology of the Circulatory System:  -Group instruction provided by verbal instruction, video, and written materials to support subject.  Reviews functional anatomy of heart, how it relates to various diagnoses, and what role the heart plays in the overall system.          CARDIAC REHAB PHASE II EXERCISE from 03/05/2016 in [facility] CARDIAC REHAB   Date  03/05/16   Instruction Review Code  2- meets goals/outcomes      Knowledge Questionnaire Score:     Knowledge Questionnaire Score - 01/24/16 1127    Knowledge Questionnaire Score   Pre Score 20/24      Core Components/Risk Factors/Patient Goals at Admission:     Personal Goals and Risk Factors at Admission - 01/24/16 0856    Core Components/Risk Factors/Patient Goals on Admission   Increase Strength and Stamina Yes    Intervention Provide advice, education, support and counseling about physical activity/exercise needs.;Develop an individualized exercise prescription for aerobic and resistive training based on initial evaluation findings, risk stratification, comorbidities and participant's personal goals.   Expected Outcomes Achievement of increased cardiorespiratory fitness and enhanced flexibility, muscular endurance and strength shown through measurements of functional capacity and personal statement of participant.   Lipids Yes  Pt's cholesterol number are WNL due to her high dose statin.  Will continue to monitor   Expected Outcomes --  Continue to maintain normal cholesterol levels   Stress Yes   Intervention Offer individual and/or small group education and counseling on adjustment to heart disease, stress management and health-related lifestyle change. Teach and support self-help strategies.;Refer participants experiencing significant psychosocial distress to appropriate mental health specialists for further evaluation and treatment. When possible, include family members and significant others in education/counseling sessions.   Expected Outcomes [place] Term: Participant demonstrates changes in health-related behavior, relaxation and other stress management skills, ability to obtain effective social support, and compliance with psychotropic medications if prescribed.;Long Term: Emotional wellbeing is indicated by absence of clinically significant psychosocial distress or social isolation.   Personal Goal Other Yes   Personal Goal "Back to Go", back to gym, learn limits   Intervention Provide exercise prescription for program and at home/gym   Expected Outcomes Adherance to exercise prescription and independent exercise      Core Components/Risk Factors/Patient Goals Review:      Goals and Risk Factor Review      02/06/16 1052 03/05/16 1244         Core Components/Risk Factors/Patient Goals Review    Personal Goals Review Increase Strength  and Stamina;Stress       Review Pt just added hill back into walking routine after reviewing home ex.  Once she is able to make it up successfully, she will feel like strength and stamina will be improved.  She is attending Stress class today. Pt not back at gym yet. Plans to return when she is able to walk uphill without SOB. Pt also using incline on TM to help with this.      Expected Outcomes Able to walk up hill! Walk uphill without SOB and return to gym.         Core Components/Risk Factors/Patient Goals at Discharge (Final Review):      Goals and Risk Factor Review - 03/05/16 1244    Core Components/Risk Factors/Patient Goals Review   Review Pt not back at gym yet. Plans to return when she is able to walk uphill without SOB. Pt also using incline on TM to help with this.   Expected Outcomes Walk uphill without SOB and return to gym.      ITP Comments:     ITP Comments      01/24/16 0856           ITP Comments Medical Director-Dr. Armanda Magic, MD          Comments: .Pt is making expected progress toward personal goals after completing 18 sessions. Recommend continued exercise and life style modification education including  stress management and relaxation techniques to decrease cardiac risk profile. Harshitha is enjoying participating in cardiac rehab and has not experienced any angina with exercise.Gladstone Lighter, RN,BSN 03/13/2016 10:13 AM

## 2016-03-12 ENCOUNTER — Encounter (HOSPITAL_COMMUNITY)
Admission: RE | Admit: 2016-03-12 | Discharge: 2016-03-12 | Disposition: A | Discharge status: Home / Self Care | Payer: Medicare Other | Source: Ambulatory Visit | Attending: Interventional Cardiology | Admitting: Interventional Cardiology

## 2016-03-12 DIAGNOSIS — Z79899 Other long term (current) drug therapy: Secondary | ICD-10-CM | POA: Diagnosis not present

## 2016-03-12 DIAGNOSIS — M8580 Other specified disorders of bone density and structure, unspecified site: Secondary | ICD-10-CM | POA: Diagnosis not present

## 2016-03-12 DIAGNOSIS — I213 ST elevation (STEMI) myocardial infarction of unspecified site: Secondary | ICD-10-CM | POA: Diagnosis not present

## 2016-03-12 DIAGNOSIS — H401122 Primary open-angle glaucoma, left eye, moderate stage: Secondary | ICD-10-CM | POA: Diagnosis not present

## 2016-03-12 DIAGNOSIS — I2510 Atherosclerotic heart disease of native coronary artery without angina pectoris: Secondary | ICD-10-CM | POA: Diagnosis not present

## 2016-03-12 DIAGNOSIS — Z955 Presence of coronary angioplasty implant and graft: Secondary | ICD-10-CM | POA: Diagnosis not present

## 2016-03-12 DIAGNOSIS — H2513 Age-related nuclear cataract, bilateral: Secondary | ICD-10-CM | POA: Diagnosis not present

## 2016-03-12 DIAGNOSIS — Z7982 Long term (current) use of aspirin: Secondary | ICD-10-CM | POA: Diagnosis not present

## 2016-03-12 DIAGNOSIS — I2111 ST elevation (STEMI) myocardial infarction involving right coronary artery: Secondary | ICD-10-CM

## 2016-03-12 NOTE — Telephone Encounter (Signed)
Lmom. pts her name on her voicemail. Per Dr.Smith Continue therapy unless symptoms prevent continuing. Pt is to call back if any questions or concerns

## 2016-03-14 ENCOUNTER — Encounter (HOSPITAL_COMMUNITY)
Admission: RE | Admit: 2016-03-14 | Discharge: 2016-03-14 | Disposition: A | Discharge status: Home / Self Care | Payer: Medicare Other | Source: Ambulatory Visit | Attending: Interventional Cardiology | Admitting: Interventional Cardiology

## 2016-03-14 DIAGNOSIS — Z7982 Long term (current) use of aspirin: Secondary | ICD-10-CM | POA: Diagnosis not present

## 2016-03-14 DIAGNOSIS — Z955 Presence of coronary angioplasty implant and graft: Secondary | ICD-10-CM

## 2016-03-14 DIAGNOSIS — I2111 ST elevation (STEMI) myocardial infarction involving right coronary artery: Secondary | ICD-10-CM

## 2016-03-14 DIAGNOSIS — Z79899 Other long term (current) drug therapy: Secondary | ICD-10-CM | POA: Diagnosis not present

## 2016-03-14 DIAGNOSIS — M8580 Other specified disorders of bone density and structure, unspecified site: Secondary | ICD-10-CM | POA: Diagnosis not present

## 2016-03-14 DIAGNOSIS — I213 ST elevation (STEMI) myocardial infarction of unspecified site: Secondary | ICD-10-CM | POA: Diagnosis not present

## 2016-03-14 DIAGNOSIS — I2510 Atherosclerotic heart disease of native coronary artery without angina pectoris: Secondary | ICD-10-CM | POA: Diagnosis not present

## 2016-03-19 ENCOUNTER — Encounter (HOSPITAL_COMMUNITY)
Admission: RE | Admit: 2016-03-19 | Discharge: 2016-03-19 | Disposition: A | Discharge status: Home / Self Care | Payer: Medicare Other | Source: Ambulatory Visit | Attending: Interventional Cardiology | Admitting: Interventional Cardiology

## 2016-03-19 DIAGNOSIS — I213 ST elevation (STEMI) myocardial infarction of unspecified site: Secondary | ICD-10-CM | POA: Diagnosis not present

## 2016-03-19 DIAGNOSIS — Z955 Presence of coronary angioplasty implant and graft: Secondary | ICD-10-CM | POA: Diagnosis not present

## 2016-03-19 DIAGNOSIS — Z7982 Long term (current) use of aspirin: Secondary | ICD-10-CM | POA: Diagnosis not present

## 2016-03-19 DIAGNOSIS — I2510 Atherosclerotic heart disease of native coronary artery without angina pectoris: Secondary | ICD-10-CM | POA: Diagnosis not present

## 2016-03-19 DIAGNOSIS — I2111 ST elevation (STEMI) myocardial infarction involving right coronary artery: Secondary | ICD-10-CM

## 2016-03-19 DIAGNOSIS — Z79899 Other long term (current) drug therapy: Secondary | ICD-10-CM | POA: Diagnosis not present

## 2016-03-19 DIAGNOSIS — M8580 Other specified disorders of bone density and structure, unspecified site: Secondary | ICD-10-CM | POA: Diagnosis not present

## 2016-03-21 ENCOUNTER — Encounter (HOSPITAL_COMMUNITY)
Admission: RE | Admit: 2016-03-21 | Discharge: 2016-03-21 | Disposition: A | Discharge status: Home / Self Care | Payer: Medicare Other | Source: Ambulatory Visit | Attending: Interventional Cardiology | Admitting: Interventional Cardiology

## 2016-03-21 DIAGNOSIS — I213 ST elevation (STEMI) myocardial infarction of unspecified site: Secondary | ICD-10-CM | POA: Insufficient documentation

## 2016-03-21 DIAGNOSIS — I2510 Atherosclerotic heart disease of native coronary artery without angina pectoris: Secondary | ICD-10-CM | POA: Diagnosis not present

## 2016-03-21 DIAGNOSIS — K219 Gastro-esophageal reflux disease without esophagitis: Secondary | ICD-10-CM | POA: Diagnosis not present

## 2016-03-21 DIAGNOSIS — Z955 Presence of coronary angioplasty implant and graft: Secondary | ICD-10-CM | POA: Diagnosis not present

## 2016-03-21 DIAGNOSIS — Z7982 Long term (current) use of aspirin: Secondary | ICD-10-CM | POA: Insufficient documentation

## 2016-03-21 DIAGNOSIS — M8580 Other specified disorders of bone density and structure, unspecified site: Secondary | ICD-10-CM | POA: Insufficient documentation

## 2016-03-21 DIAGNOSIS — S60031A Contusion of right middle finger without damage to nail, initial encounter: Secondary | ICD-10-CM | POA: Diagnosis not present

## 2016-03-21 DIAGNOSIS — R238 Other skin changes: Secondary | ICD-10-CM | POA: Diagnosis not present

## 2016-03-21 DIAGNOSIS — Z79899 Other long term (current) drug therapy: Secondary | ICD-10-CM | POA: Insufficient documentation

## 2016-03-21 DIAGNOSIS — I2111 ST elevation (STEMI) myocardial infarction involving right coronary artery: Secondary | ICD-10-CM

## 2016-03-21 DIAGNOSIS — Z6821 Body mass index (BMI) 21.0-21.9, adult: Secondary | ICD-10-CM | POA: Diagnosis not present

## 2016-03-24 ENCOUNTER — Encounter (HOSPITAL_COMMUNITY)
Admission: RE | Admit: 2016-03-24 | Discharge: 2016-03-24 | Disposition: A | Discharge status: Home / Self Care | Payer: Medicare Other | Source: Ambulatory Visit | Attending: Interventional Cardiology | Admitting: Interventional Cardiology

## 2016-03-24 ENCOUNTER — Other Ambulatory Visit: Payer: Self-pay | Admitting: Internal Medicine

## 2016-03-24 DIAGNOSIS — I213 ST elevation (STEMI) myocardial infarction of unspecified site: Secondary | ICD-10-CM | POA: Diagnosis not present

## 2016-03-24 DIAGNOSIS — I2510 Atherosclerotic heart disease of native coronary artery without angina pectoris: Secondary | ICD-10-CM | POA: Diagnosis not present

## 2016-03-24 DIAGNOSIS — M8580 Other specified disorders of bone density and structure, unspecified site: Secondary | ICD-10-CM | POA: Diagnosis not present

## 2016-03-24 DIAGNOSIS — Z955 Presence of coronary angioplasty implant and graft: Secondary | ICD-10-CM | POA: Diagnosis not present

## 2016-03-24 DIAGNOSIS — Z7982 Long term (current) use of aspirin: Secondary | ICD-10-CM | POA: Diagnosis not present

## 2016-03-24 DIAGNOSIS — I2111 ST elevation (STEMI) myocardial infarction involving right coronary artery: Secondary | ICD-10-CM

## 2016-03-24 DIAGNOSIS — Z79899 Other long term (current) drug therapy: Secondary | ICD-10-CM | POA: Diagnosis not present

## 2016-03-26 ENCOUNTER — Encounter (HOSPITAL_COMMUNITY): Payer: Medicare Other

## 2016-03-28 ENCOUNTER — Encounter (HOSPITAL_COMMUNITY): Payer: Medicare Other

## 2016-03-31 ENCOUNTER — Encounter (HOSPITAL_COMMUNITY)
Admission: RE | Admit: 2016-03-31 | Discharge: 2016-03-31 | Disposition: A | Discharge status: Home / Self Care | Payer: Medicare Other | Source: Ambulatory Visit | Attending: Interventional Cardiology | Admitting: Interventional Cardiology

## 2016-03-31 DIAGNOSIS — Z955 Presence of coronary angioplasty implant and graft: Secondary | ICD-10-CM

## 2016-03-31 DIAGNOSIS — I2510 Atherosclerotic heart disease of native coronary artery without angina pectoris: Secondary | ICD-10-CM | POA: Diagnosis not present

## 2016-03-31 DIAGNOSIS — Z79899 Other long term (current) drug therapy: Secondary | ICD-10-CM | POA: Diagnosis not present

## 2016-03-31 DIAGNOSIS — I2111 ST elevation (STEMI) myocardial infarction involving right coronary artery: Secondary | ICD-10-CM

## 2016-03-31 DIAGNOSIS — M8580 Other specified disorders of bone density and structure, unspecified site: Secondary | ICD-10-CM | POA: Diagnosis not present

## 2016-03-31 DIAGNOSIS — Z7982 Long term (current) use of aspirin: Secondary | ICD-10-CM | POA: Diagnosis not present

## 2016-03-31 DIAGNOSIS — I213 ST elevation (STEMI) myocardial infarction of unspecified site: Secondary | ICD-10-CM | POA: Diagnosis not present

## 2016-04-02 ENCOUNTER — Encounter (HOSPITAL_COMMUNITY)
Admission: RE | Admit: 2016-04-02 | Discharge: 2016-04-02 | Disposition: A | Discharge status: Home / Self Care | Payer: Medicare Other | Source: Ambulatory Visit | Attending: Interventional Cardiology | Admitting: Interventional Cardiology

## 2016-04-02 DIAGNOSIS — Z79899 Other long term (current) drug therapy: Secondary | ICD-10-CM | POA: Diagnosis not present

## 2016-04-02 DIAGNOSIS — Z955 Presence of coronary angioplasty implant and graft: Secondary | ICD-10-CM | POA: Diagnosis not present

## 2016-04-02 DIAGNOSIS — Z7982 Long term (current) use of aspirin: Secondary | ICD-10-CM | POA: Diagnosis not present

## 2016-04-02 DIAGNOSIS — M8580 Other specified disorders of bone density and structure, unspecified site: Secondary | ICD-10-CM | POA: Diagnosis not present

## 2016-04-02 DIAGNOSIS — I213 ST elevation (STEMI) myocardial infarction of unspecified site: Secondary | ICD-10-CM | POA: Diagnosis not present

## 2016-04-02 DIAGNOSIS — I2510 Atherosclerotic heart disease of native coronary artery without angina pectoris: Secondary | ICD-10-CM | POA: Diagnosis not present

## 2016-04-02 DIAGNOSIS — I2111 ST elevation (STEMI) myocardial infarction involving right coronary artery: Secondary | ICD-10-CM

## 2016-04-04 ENCOUNTER — Encounter (HOSPITAL_COMMUNITY)
Admission: RE | Admit: 2016-04-04 | Discharge: 2016-04-04 | Disposition: A | Discharge status: Home / Self Care | Payer: Medicare Other | Source: Ambulatory Visit | Attending: Interventional Cardiology | Admitting: Interventional Cardiology

## 2016-04-04 DIAGNOSIS — Z7982 Long term (current) use of aspirin: Secondary | ICD-10-CM | POA: Diagnosis not present

## 2016-04-04 DIAGNOSIS — I2111 ST elevation (STEMI) myocardial infarction involving right coronary artery: Secondary | ICD-10-CM

## 2016-04-04 DIAGNOSIS — Z955 Presence of coronary angioplasty implant and graft: Secondary | ICD-10-CM | POA: Diagnosis not present

## 2016-04-04 DIAGNOSIS — I213 ST elevation (STEMI) myocardial infarction of unspecified site: Secondary | ICD-10-CM | POA: Diagnosis not present

## 2016-04-04 DIAGNOSIS — Z79899 Other long term (current) drug therapy: Secondary | ICD-10-CM | POA: Diagnosis not present

## 2016-04-04 DIAGNOSIS — M8580 Other specified disorders of bone density and structure, unspecified site: Secondary | ICD-10-CM | POA: Diagnosis not present

## 2016-04-04 DIAGNOSIS — I2510 Atherosclerotic heart disease of native coronary artery without angina pectoris: Secondary | ICD-10-CM | POA: Diagnosis not present

## 2016-04-07 ENCOUNTER — Encounter (HOSPITAL_COMMUNITY)
Admission: RE | Admit: 2016-04-07 | Discharge: 2016-04-07 | Disposition: A | Discharge status: Home / Self Care | Payer: Medicare Other | Source: Ambulatory Visit | Attending: Interventional Cardiology | Admitting: Interventional Cardiology

## 2016-04-07 DIAGNOSIS — Z7982 Long term (current) use of aspirin: Secondary | ICD-10-CM | POA: Diagnosis not present

## 2016-04-07 DIAGNOSIS — I213 ST elevation (STEMI) myocardial infarction of unspecified site: Secondary | ICD-10-CM | POA: Diagnosis not present

## 2016-04-07 DIAGNOSIS — I2510 Atherosclerotic heart disease of native coronary artery without angina pectoris: Secondary | ICD-10-CM | POA: Diagnosis not present

## 2016-04-07 DIAGNOSIS — Z955 Presence of coronary angioplasty implant and graft: Secondary | ICD-10-CM

## 2016-04-07 DIAGNOSIS — M8580 Other specified disorders of bone density and structure, unspecified site: Secondary | ICD-10-CM | POA: Diagnosis not present

## 2016-04-07 DIAGNOSIS — I2111 ST elevation (STEMI) myocardial infarction involving right coronary artery: Secondary | ICD-10-CM

## 2016-04-07 DIAGNOSIS — Z79899 Other long term (current) drug therapy: Secondary | ICD-10-CM | POA: Diagnosis not present

## 2016-04-09 ENCOUNTER — Encounter (HOSPITAL_COMMUNITY)
Admission: RE | Admit: 2016-04-09 | Discharge: 2016-04-09 | Disposition: A | Discharge status: Home / Self Care | Payer: Medicare Other | Source: Ambulatory Visit | Attending: Interventional Cardiology | Admitting: Interventional Cardiology

## 2016-04-09 DIAGNOSIS — I2111 ST elevation (STEMI) myocardial infarction involving right coronary artery: Secondary | ICD-10-CM

## 2016-04-09 DIAGNOSIS — Z955 Presence of coronary angioplasty implant and graft: Secondary | ICD-10-CM | POA: Diagnosis not present

## 2016-04-09 DIAGNOSIS — Z7982 Long term (current) use of aspirin: Secondary | ICD-10-CM | POA: Diagnosis not present

## 2016-04-09 DIAGNOSIS — Z79899 Other long term (current) drug therapy: Secondary | ICD-10-CM | POA: Diagnosis not present

## 2016-04-09 DIAGNOSIS — I2510 Atherosclerotic heart disease of native coronary artery without angina pectoris: Secondary | ICD-10-CM | POA: Diagnosis not present

## 2016-04-09 DIAGNOSIS — M8580 Other specified disorders of bone density and structure, unspecified site: Secondary | ICD-10-CM | POA: Diagnosis not present

## 2016-04-09 DIAGNOSIS — I213 ST elevation (STEMI) myocardial infarction of unspecified site: Secondary | ICD-10-CM | POA: Diagnosis not present

## 2016-04-10 NOTE — Progress Notes (Signed)
Cardiac Individual Treatment Plan  Patient Details  Name: Nancy Neal MRN: [id number] Date of Birth: Nov 11, 1931 Referring Provider:        CARDIAC REHAB PHASE II ORIENTATION from 01/24/2016 in [facility] CARDIAC REHAB   Referring Provider  Verdis Prime MD      Initial Encounter Date:       CARDIAC REHAB PHASE II ORIENTATION from 01/24/2016 in [facility] CARDIAC REHAB   Date  01/24/16   Referring Provider  Verdis Prime MD      Visit Diagnosis: Status post insertion of drug-eluting stent into right coronary artery for coronary artery disease  ST elevation (STEMI) myocardial infarction involving right coronary artery (HCC)  Patient's Home Medications on Admission:  Current outpatient prescriptions:  .  acetaminophen (TYLENOL) 325 MG tablet, Take 2 tablets (650 mg total) by mouth every 4 (four) hours as needed for headache or mild pain., Disp: , Rfl:  .  aspirin 81 MG chewable tablet, Chew 1 tablet (81 mg total) by mouth daily., Disp: , Rfl:  .  atorvastatin (LIPITOR) 40 MG tablet, Take 40 mg by mouth daily., Disp: , Rfl:  .  carboxymethylcellulose (REFRESH PLUS) 0.5 % SOLN, Place 1 drop into both eyes 3 (three) times daily as needed (dry eyes). , Disp: , Rfl:  .  isosorbide mononitrate (IMDUR) 30 MG 24 hr tablet, TAKE 1 TABLET(30 MG) BY MOUTH DAILY, Disp: 30 tablet, Rfl: 5 .  metoprolol tartrate (LOPRESSOR) 25 MG tablet, Take 0.5 tablets (12.5 mg total) by mouth 2 (two) times daily., Disp: 90 tablet, Rfl: 3 .  Multiple Vitamin (MULTIVITAMIN WITH MINERALS) TABS tablet, Take 1 tablet by mouth daily., Disp: , Rfl:  .  nitroGLYCERIN (NITROSTAT) 0.4 MG SL tablet, Place 1 tablet (0.4 mg total) under the tongue every 5 (five) minutes as needed for chest pain., Disp: 25 tablet, Rfl: 2 .  OVER THE COUNTER MEDICATION, Take 1 tablet by mouth daily. Over the counter antihistamine, Disp: , Rfl:  .  ticagrelor (BRILINTA) 90 MG TABS tablet, Take 1 tablet (90 mg total)  by mouth 2 (two) times daily., Disp: 180 tablet, Rfl: 3 .  VITAMIN E PO, Take 1 capsule by mouth daily. , Disp: , Rfl:   Past Medical History: Past Medical History  Diagnosis Date  . ALLERGIC RHINITIS 06/02/2007  . OSTEOPENIA 06/02/2007  . VAGINITIS, ATROPHIC, POSTMENOPAUSAL 12/06/2008  . VERTIGO 02/16/2009  . Glaucoma 1-12    open angle  . Coronary artery disease 11/2015    a. 11/2015: Inferior STEMI w/ Promus DES to RCA.    . Diverticulosis of colon 2003    by 2003 and 2008 colonoscopy.   Marland Kitchen History of blood transfusion 1971    "when I had hysterectomy"  . GERD (gastroesophageal reflux disease)   . Arthritis     "maybe in my right knee" (12/19/2015)  . Myocardial infarction (HCC) 11/2015    Tobacco Use: History  Smoking status  . Never Smoker   Smokeless tobacco  . Never Used    Labs:     Recent Review Flowsheet Data    Labs for ITP Cardiac and Pulmonary Rehab Latest Ref Rng 12/12/2009 12/18/2011 01/04/2014 12/15/2015 02/15/2016   Cholestrol 125 - 200 mg/dL 409(W) 119(J) 478(G) 956 118(L)   LDLCALC <130 mg/dL - - 213(Y) 84 35   LDLDIRECT - 144.6 123.2 - - -   HDL >=46 mg/dL 86.57 84.69 62.95 61 55   Trlycerides <150 mg/dL 284.1 32.4 401.0 272  140      Capillary Blood Glucose: Lab Results  Component Value Date   GLUCAP 92 12/20/2015   GLUCAP 96 12/19/2015     Exercise Target Goals:    Exercise Program Goal: Individual exercise prescription set with THRR, safety & activity barriers. Participant demonstrates ability to understand and report RPE using BORG scale, to self-measure pulse accurately, and to acknowledge the importance of the exercise prescription.  Exercise Prescription Goal: Starting with aerobic activity 30 plus minutes a day, 3 days per week for initial exercise prescription. Provide home exercise prescription and guidelines that participant acknowledges understanding prior to discharge.  Activity Barriers & Risk Stratification:     Activity Barriers &  Cardiac Risk Stratification - 01/24/16 0854    Activity Barriers & Cardiac Risk Stratification   Activity Barriers Arthritis   Cardiac Risk Stratification High      6 Minute Walk:     6 Minute Walk      01/24/16 0952       6 Minute Walk   Phase Initial     Distance 1535 feet     Walk Time 6 minutes     # of Rest Breaks 0     MPH 2.91     METS 2.73     RPE 7     VO2 Peak 9.57     Symptoms No     Resting HR 72 bpm     Resting BP 124/66 mmHg     Max Ex. HR 79 bpm     Max Ex. BP 126/64 mmHg     2 Minute Post BP 116/64 mmHg        Initial Exercise Prescription:     Initial Exercise Prescription - 01/31/16 1300    Date of Initial Exercise RX and Referring Provider   Date 01/24/16   Referring Provider Verdis Prime MD   Treadmill   MPH 2   Grade 1   Minutes 10   METs 2.81   Bike   Level 0.6   Minutes 10   METs 2.73   NuStep   Level 2   Minutes 10   METs 2   Prescription Details   Frequency (times per week) 3   Intensity   THRR 40-80% of Max Heartrate 86-111   Ratings of Perceived Exertion 11-13   Progression   Progression Continue to progress workloads to maintain intensity without signs/symptoms of physical distress.   Resistance Training   Training Prescription Yes   Weight 2lbs   Reps 10-12      Perform Capillary Blood Glucose checks as needed.  Exercise Prescription Changes:      Exercise Prescription Changes      02/04/16 1000 02/05/16 1000 02/19/16 0900 03/05/16 1200 03/24/16 1600   Exercise Review   Progression  Yes Yes Yes Yes   Response to Exercise   Blood Pressure (Admit)  102/58 mmHg 114/70 mmHg 102/76 mmHg 120/80 mmHg   Blood Pressure (Exercise)  138/80 mmHg 130/62 mmHg 140/60 mmHg 128/60 mmHg   Blood Pressure (Exit)  100/60 mmHg 104/62 mmHg 102/62 mmHg 102/60 mmHg   Heart Rate (Admit)  74 bpm 72 bpm 77 bpm 78 bpm   Heart Rate (Exercise)  91 bpm 104 bpm 105 bpm 102 bpm   Heart Rate (Exit)  74 bpm 73 bpm 77 bpm 79 bpm   Rating of  Perceived Exertion (Exercise)  13 13 13 14    Symptoms  none none none none   Comments Home  Exercise Given 02/04/16 Home Exercise Given 02/04/16 Home Exercise Given 02/04/16 Home Exercise Given 02/04/16 Home Exercise Given 02/04/16   Duration  Progress to 30 minutes of continuous aerobic without signs/symptoms of physical distress Progress to 30 minutes of continuous aerobic without signs/symptoms of physical distress Progress to 30 minutes of continuous aerobic without signs/symptoms of physical distress Progress to 30 minutes of continuous aerobic without signs/symptoms of physical distress   Intensity  THRR unchanged THRR unchanged THRR unchanged THRR unchanged   Progression   Progression  Continue to progress workloads to maintain intensity without signs/symptoms of physical distress. Continue to progress workloads to maintain intensity without signs/symptoms of physical distress. Continue to progress workloads to maintain intensity without signs/symptoms of physical distress. Continue to progress workloads to maintain intensity without signs/symptoms of physical distress.   Average METs  2.5 3.3 4.1 4.2   Resistance Training   Training Prescription Yes Yes Yes Yes Yes   Weight 2lbs 2lbs 2lbs 2lbs 2lbs   Reps 10-12 10-12 10-12 10-12 10-12   Interval Training   Interval Training   No No No   Treadmill   MPH 2 2 2.5 2.5 2.5   Grade 1 1 3 4 4    Minutes 10 10 10 10 10    METs 2.81 2.81 3.95 4.29 4.29   Bike   Level 0.6 1 1 1 1    Minutes 10 10 10 10 10    METs 2.73 2.87 2.88 4.13 4.12   NuStep   Level 2 2 3 3 4    Minutes 10 10 10 10 10    METs 2 1.8 3.1 4 5.1   Home Exercise Plan   Plans to continue exercise at [employer] (comment)  [employer] and walking at home [employer] (comment)  [employer] and walking at home [employer] (comment)  [employer] and walking at home [employer] (comment) [employer] (comment)   Frequency Add 3 additional days to program exercise sessions.  Add 3 additional days to program exercise sessions. Add 3 additional days to program exercise sessions. Add 3 additional days to program exercise sessions. Add 3 additional days to program exercise sessions.     04/02/16 1100           Exercise Review   Progression Yes       Response to Exercise   Blood Pressure (Admit) 102/76 mmHg       Blood Pressure (Exercise) 112/60 mmHg       Blood Pressure (Exit) 124/68 mmHg       Heart Rate (Admit) 78 bpm       Heart Rate (Exercise) 95 bpm       Heart Rate (Exit) 68 bpm       Rating of Perceived Exertion (Exercise) 13       Symptoms none       Comments Home Exercise Given 02/04/16       Duration Progress to 30 minutes of continuous aerobic without signs/symptoms of physical distress       Intensity THRR unchanged       Progression   Progression Continue to progress workloads to maintain intensity without signs/symptoms of physical distress.       Average METs 4.5       Resistance Training   Training Prescription Yes       Weight 1 lb       Reps 10-12       Interval Training   Interval Training No       Treadmill  MPH 2.5       Grade 4       Minutes 10       METs 4.29       Bike   Level 1       Minutes 10       METs 4.1       NuStep   Level 4       Minutes 10       METs 4.7       Home Exercise Plan   Plans to continue exercise at [facility] (comment)       Frequency Add 3 additional days to program exercise sessions.          Exercise Comments:      Exercise Comments      02/05/16 1054 02/19/16 0955 03/05/16 1239 03/24/16 1634 04/02/16 1121   Exercise Comments Pt is tolerating exercise well and continues to make progress.  During home exercise review, she mentioned that she works out harder at [employer]  She did state that her walk test was not much effort.  I told her that we would work on increasing her workloads. Pt is doing well with workload increases for exercise and is finding it more challenging. Pt is  walking 30 minutes 3 days per week outside of [facility]. Plans to return to gym when she walk uphill without SOB. Continues to do well with exercise. Exxercising 6 days per week. Walking 60 minutes at home.      Discharge Exercise Prescription (Final Exercise Prescription Changes):     Exercise Prescription Changes - 04/02/16 1100    Exercise Review   Progression Yes   Response to Exercise   Blood Pressure (Admit) 102/76 mmHg   Blood Pressure (Exercise) 112/60 mmHg   Blood Pressure (Exit) 124/68 mmHg   Heart Rate (Admit) 78 bpm   Heart Rate (Exercise) 95 bpm   Heart Rate (Exit) 68 bpm   Rating of Perceived Exertion (Exercise) 13   Symptoms none   Comments Home Exercise Given 02/04/16   Duration Progress to 30 minutes of continuous aerobic without signs/symptoms of physical distress   Intensity THRR unchanged   Progression   Progression Continue to progress workloads to maintain intensity without signs/symptoms of physical distress.   Average METs 4.5   Resistance Training   Training Prescription Yes   Weight 1 lb   Reps 10-12   Interval Training   Interval Training No   Treadmill   MPH 2.5   Grade 4   Minutes 10   METs 4.29   Bike   Level 1   Minutes 10   METs 4.1   NuStep   Level 4   Minutes 10   METs 4.7   Home Exercise Plan   Plans to continue exercise at [employer] (comment)   Frequency Add 3 additional days to program exercise sessions.      Nutrition:  Target Goals: Understanding of nutrition guidelines, daily intake of sodium 1500mg , cholesterol 200mg , calories 30% from fat and 7% or less from saturated fats, daily to have 5 or more servings of fruits and vegetables.  Biometrics:     Pre Biometrics - 01/24/16 0954    Pre Biometrics   Height 5\' 5"  (1.651 m)   Weight 132 lb 11.5 oz (60.2 kg)   Waist Circumference 27 inches   Hip Circumference 36 inches   Waist to Hip Ratio 0.75 %   BMI (Calculated) 22.1   Triceps Skinfold  20 mm   % Body Fat 32.3  %   Grip Strength 25 kg   Flexibility 15 in   Single Leg Stand 26.43 seconds       Nutrition Therapy Plan and Nutrition Goals:     Nutrition Therapy & Goals - 01/24/16 1420    Nutrition Therapy   Diet Therapeutic Lifestyle Changes   Personal Nutrition Goals   Personal Goal #1 Maintain current wt around 132 lb   Intervention Plan   Intervention Prescribe, educate and counsel regarding individualized specific dietary modifications aiming towards targeted core components such as weight, hypertension, lipid management, diabetes, heart failure and other comorbidities.   Expected Outcomes Fricker Term Goal: Understand basic principles of dietary content, such as calories, fat, sodium, cholesterol and nutrients.;Long Term Goal: Adherence to prescribed nutrition plan.      Nutrition Discharge: Nutrition Scores:     Nutrition Assessments - 02/08/16 1052    MEDFICTS Scores   Pre Score 37      Nutrition Goals Re-Evaluation:   Psychosocial: Target Goals: Acknowledge presence or absence of depression, maximize coping skills, provide positive support system. Participant is able to verbalize types and ability to use techniques and skills needed for reducing stress and depression.  Initial Review & Psychosocial Screening:     Initial Psych Review & Screening - 01/30/16 1635    Initial Review   Current issues with Current Stress Concerns   Comments Current stress concerns related to family dynamics with daughter who lives out of state   Family Dynamics   Good Support System? Yes   Concerns Inappropriate over/under dependence on family/friends  Ms Riemann is not on good speaking terms with her daughter in [place], lives with son here in [place]   Comments Patient has good family support and friends here in [place].   Barriers   Psychosocial barriers to participate in program The patient should benefit from training in stress management and relaxation.   Screening Interventions    Interventions Encouraged to exercise;Other (comment)   Comments Patient offered to meet with Theda Belfast the hospital chaplain. Ms Kimes refused at this time. Will conitnue to offer emotional support.      Quality of Life Scores:     Quality of Life - 01/24/16 1131    Quality of Life Scores   Health/Function Pre 28.62 %   Socioeconomic Pre 29.58 %   Psych/Spiritual Pre 27.43 %   Family Pre 26.38 %   GLOBAL Pre 28.23 %      PHQ-9:     Recent Review Flowsheet Data    Depression screen [facility] 2/9 01/30/2016 01/15/2016 01/08/2015 01/04/2014   Decreased Interest 0 0 0 0   Down, Depressed, Hopeless 0 0 0 0   PHQ - 2 Score 0 0 0 0      Psychosocial Evaluation and Intervention:   Psychosocial Re-Evaluation:     Psychosocial Re-Evaluation      02/11/16 1058 03/11/16 0939 04/10/16 1745       Psychosocial Re-Evaluation   Interventions Encouraged to attend Cardiac Rehabilitation for the exercise;Stress management education Encouraged to attend Cardiac Rehabilitation for the exercise Encouraged to attend Cardiac Rehabilitation for the exercise     Comments  Patient reports she has had contact with her daughter which makes her happy      Continued Psychosocial Services Needed No No No        Vocational Rehabilitation: Provide vocational rehab assistance to qualifying candidates.   Vocational Rehab Evaluation & Intervention:  Vocational Rehab - 01/24/16 1229    Initial Vocational Rehab Evaluation & Intervention   Assessment shows need for Vocational Rehabilitation No      Education: Education Goals: Education classes will be provided on a weekly basis, covering required topics. Participant will state understanding/return demonstration of topics presented.  Learning Barriers/Preferences:     Learning Barriers/Preferences - 01/24/16 1013    Learning Barriers/Preferences   Learning Barriers Sight;Hearing  nerve damage left ear   Learning Preferences Written  Material;Group Instruction;Individual Instruction      Education Topics: Count Your Pulse:  -Group instruction provided by verbal instruction, demonstration, patient participation and written materials to support subject.  Instructors address importance of being able to find your pulse and how to count your pulse when at home without a heart monitor.  Patients get hands on experience counting their pulse with staff help and individually.      CARDIAC REHAB PHASE II EXERCISE from 04/04/2016 in [facility] CARDIAC REHAB   Date  02/22/16   Educator  Deveron Furlong   Instruction Review Code  2- meets goals/outcomes      Heart Attack, Angina, and Risk Factor Modification:  -Group instruction provided by verbal instruction, video, and written materials to support subject.  Instructors address signs and symptoms of angina and heart attacks.    Also discuss risk factors for heart disease and how to make changes to improve heart health risk factors.      CARDIAC REHAB PHASE II EXERCISE from 04/04/2016 in [facility] CARDIAC REHAB   Date  02/20/16   Instruction Review Code  2- meets goals/outcomes      Functional Fitness:  -Group instruction provided by verbal instruction, demonstration, patient participation, and written materials to support subject.  Instructors address safety measures for doing things around the house.  Discuss how to get up and down off the floor, how to pick things up properly, how to safely get out of a chair without assistance, and balance training.      CARDIAC REHAB PHASE II EXERCISE from 04/04/2016 in [facility] CARDIAC REHAB   Date  02/08/16   Instruction Review Code  2- meets goals/outcomes      Meditation and Mindfulness:  -Group instruction provided by verbal instruction, patient participation, and written materials to support subject.  Instructor addresses importance of mindfulness and meditation practice to help reduce  stress and improve awareness.  Instructor also leads participants through a meditation exercise.       CARDIAC REHAB PHASE II EXERCISE from 04/04/2016 in [facility] CARDIAC REHAB   Date  03/12/16   Educator  Theda Belfast   Instruction Review Code  2- meets goals/outcomes      Stretching for Flexibility and Mobility:  -Group instruction provided by verbal instruction, patient participation, and written materials to support subject.  Instructors lead participants through series of stretches that are designed to increase flexibility thus improving mobility.  These stretches are additional exercise for major muscle groups that are typically performed during regular warm up and cool down.      CARDIAC REHAB PHASE II EXERCISE from 04/04/2016 in [facility] CARDIAC REHAB   Date  03/14/16   Instruction Review Code  2- meets goals/outcomes      Hands Only CPR Anytime:  -Group instruction provided by verbal instruction, video, patient participation and written materials to support subject.  Instructors co-teach with AHA video for hands only CPR.  Participants  get hands on experience with mannequins.      CARDIAC REHAB PHASE II EXERCISE from 04/04/2016 in [facility] CARDIAC REHAB   Date  04/04/16   Instruction Review Code  2- meets goals/outcomes      Nutrition I class: Heart Healthy Eating:  -Group instruction provided by PowerPoint slides, verbal discussion, and written materials to support subject matter. The instructor gives an explanation and review of the Therapeutic Lifestyle Changes diet recommendations, which includes a discussion on lipid goals, dietary fat, sodium, fiber, plant stanol/sterol esters, sugar, and the components of a well-balanced, healthy diet.   Nutrition II class: Lifestyle Skills:  -Group instruction provided by PowerPoint slides, verbal discussion, and written materials to support subject matter. The instructor gives an  explanation and review of label reading, grocery shopping for heart health, heart healthy recipe modifications, and ways to make healthier choices when eating out.      CARDIAC REHAB PHASE II EXERCISE from 04/04/2016 in [facility] CARDIAC REHAB   Date  02/26/16   Educator  RD   Instruction Review Code  2- meets goals/outcomes      Diabetes Question & Answer:  -Group instruction provided by PowerPoint slides, verbal discussion, and written materials to support subject matter. The instructor gives an explanation and review of diabetes co-morbidities, pre- and post-prandial blood glucose goals, pre-exercise blood glucose goals, signs, symptoms, and treatment of hypoglycemia and hyperglycemia, and foot care basics.   Diabetes Blitz:  -Group instruction provided by PowerPoint slides, verbal discussion, and written materials to support subject matter. The instructor gives an explanation and review of the physiology behind type 1 and type 2 diabetes, diabetes medications and rational behind using different medications, pre- and post-prandial blood glucose recommendations and Hemoglobin A1c goals, diabetes diet, and exercise including blood glucose guidelines for exercising safely.    Portion Distortion:  -Group instruction provided by PowerPoint slides, verbal discussion, written materials, and food models to support subject matter. The instructor gives an explanation of serving size versus portion size, changes in portions sizes over the last 20 years, and what consists of a serving from each food group.      CARDIAC REHAB PHASE II EXERCISE from 04/04/2016 in [facility] CARDIAC REHAB   Date  02/13/16   Educator  RD   Instruction Review Code  2- meets goals/outcomes      Stress Management:  -Group instruction provided by verbal instruction, video, and written materials to support subject matter.  Instructors review role of stress in heart disease and how to cope  with stress positively.        CARDIAC REHAB PHASE II EXERCISE from 04/04/2016 in [facility] CARDIAC REHAB   Date  02/06/16   Instruction Review Code  2- meets goals/outcomes      Exercising on Your Own:  -Group instruction provided by verbal instruction, power point, and written materials to support subject.  Instructors discuss benefits of exercise, components of exercise, frequency and intensity of exercise, and end points for exercise.  Also discuss use of nitroglycerin and activating EMS.  Review options of places to exercise outside of rehab.  Review guidelines for sex with heart disease.   Cardiac Drugs I:  -Group instruction provided by verbal instruction and written materials to support subject.  Instructor reviews cardiac drug classes: antiplatelets, anticoagulants, beta blockers, and statins.  Instructor discusses reasons, side effects, and lifestyle considerations for each drug class.      CARDIAC  REHAB PHASE II EXERCISE from 04/04/2016 in [facility] CARDIAC REHAB   Date  01/30/16   Educator  Pharm D   Instruction Review Code  2- meets goals/outcomes      Cardiac Drugs II:  -Group instruction provided by verbal instruction and written materials to support subject.  Instructor reviews cardiac drug classes: angiotensin converting enzyme inhibitors (ACE-I), angiotensin II receptor blockers (ARBs), nitrates, and calcium channel blockers.  Instructor discusses reasons, side effects, and lifestyle considerations for each drug class.   Anatomy and Physiology of the Circulatory System:  -Group instruction provided by verbal instruction, video, and written materials to support subject.  Reviews functional anatomy of heart, how it relates to various diagnoses, and what role the heart plays in the overall system.          CARDIAC REHAB PHASE II EXERCISE from 04/04/2016 in [facility] CARDIAC REHAB   Date  03/05/16   Instruction Review  Code  2- meets goals/outcomes      Knowledge Questionnaire Score:     Knowledge Questionnaire Score - 01/24/16 1127    Knowledge Questionnaire Score   Pre Score 20/24      Core Components/Risk Factors/Patient Goals at Admission:     Personal Goals and Risk Factors at Admission - 01/24/16 0856    Core Components/Risk Factors/Patient Goals on Admission   Increase Strength and Stamina Yes   Intervention Provide advice, education, support and counseling about physical activity/exercise needs.;Develop an individualized exercise prescription for aerobic and resistive training based on initial evaluation findings, risk stratification, comorbidities and participant's personal goals.   Expected Outcomes Achievement of increased cardiorespiratory fitness and enhanced flexibility, muscular endurance and strength shown through measurements of functional capacity and personal statement of participant.   Lipids Yes  Pt's cholesterol number are WNL due to her high dose statin.  Will continue to monitor   Expected Outcomes --  Continue to maintain normal cholesterol levels   Stress Yes   Intervention Offer individual and/or small group education and counseling on adjustment to heart disease, stress management and health-related lifestyle change. Teach and support self-help strategies.;Refer participants experiencing significant psychosocial distress to appropriate mental health specialists for further evaluation and treatment. When possible, include family members and significant others in education/counseling sessions.   Expected Outcomes [place] Term: Participant demonstrates changes in health-related behavior, relaxation and other stress management skills, ability to obtain effective social support, and compliance with psychotropic medications if prescribed.;Long Term: Emotional wellbeing is indicated by absence of clinically significant psychosocial distress or social isolation.   Personal Goal Other  Yes   Personal Goal "Back to Go", back to gym, learn limits   Intervention Provide exercise prescription for program and at home/gym   Expected Outcomes Adherance to exercise prescription and independent exercise      Core Components/Risk Factors/Patient Goals Review:      Goals and Risk Factor Review      02/06/16 1052 03/05/16 1244 04/02/16 1119       Core Components/Risk Factors/Patient Goals Review   Personal Goals Review Increase Strength and Stamina;Stress  Other     Review Pt just added hill back into walking routine after reviewing home ex.  Once she is able to make it up successfully, she will feel like strength and stamina will be improved.  She is attending Stress class today. Pt not back at gym yet. Plans to return when she is able to walk uphill without SOB. Pt also using incline on TM  to help with this. Pt is doing well with exercise. Pt exercising 6 days/ week for 60 minutes. Pt exercised at gym while on vacation last week. Pt states she's able to walk farther.     Expected Outcomes Able to walk up hill! Walk uphill without SOB and return to gym. Continue regular exercise program and eventually return to gym.        Core Components/Risk Factors/Patient Goals at Discharge (Final Review):      Goals and Risk Factor Review - 04/02/16 1119    Core Components/Risk Factors/Patient Goals Review   Personal Goals Review Other   Review Pt is doing well with exercise. Pt exercising 6 days/ week for 60 minutes. Pt exercised at gym while on vacation last week. Pt states she's able to walk farther.   Expected Outcomes Continue regular exercise program and eventually return to gym.      ITP Comments:     ITP Comments      01/24/16 0856           ITP Comments Medical Director-Dr. Armanda Magic, MD          Comments: *Pt is making expected progress toward personal goals after completing 28 sessions. Recommend continued exercise and life style modification education including   stress management and relaxation techniques to decrease cardiac risk profile. Gladstone Lighter, RN,BSN 04/10/2016 5:47 PM

## 2016-04-11 ENCOUNTER — Encounter (HOSPITAL_COMMUNITY)
Admission: RE | Admit: 2016-04-11 | Discharge: 2016-04-11 | Disposition: A | Discharge status: Home / Self Care | Payer: Medicare Other | Source: Ambulatory Visit | Attending: Interventional Cardiology | Admitting: Interventional Cardiology

## 2016-04-11 DIAGNOSIS — I213 ST elevation (STEMI) myocardial infarction of unspecified site: Secondary | ICD-10-CM | POA: Diagnosis not present

## 2016-04-11 DIAGNOSIS — I2111 ST elevation (STEMI) myocardial infarction involving right coronary artery: Secondary | ICD-10-CM

## 2016-04-11 DIAGNOSIS — Z955 Presence of coronary angioplasty implant and graft: Secondary | ICD-10-CM | POA: Diagnosis not present

## 2016-04-11 DIAGNOSIS — Z7982 Long term (current) use of aspirin: Secondary | ICD-10-CM | POA: Diagnosis not present

## 2016-04-11 DIAGNOSIS — I2510 Atherosclerotic heart disease of native coronary artery without angina pectoris: Secondary | ICD-10-CM | POA: Diagnosis not present

## 2016-04-11 DIAGNOSIS — Z79899 Other long term (current) drug therapy: Secondary | ICD-10-CM | POA: Diagnosis not present

## 2016-04-11 DIAGNOSIS — M8580 Other specified disorders of bone density and structure, unspecified site: Secondary | ICD-10-CM | POA: Diagnosis not present

## 2016-04-14 ENCOUNTER — Encounter (HOSPITAL_COMMUNITY)
Admission: RE | Admit: 2016-04-14 | Discharge: 2016-04-14 | Disposition: A | Discharge status: Home / Self Care | Payer: Medicare Other | Source: Ambulatory Visit | Attending: Interventional Cardiology | Admitting: Interventional Cardiology

## 2016-04-14 DIAGNOSIS — I213 ST elevation (STEMI) myocardial infarction of unspecified site: Secondary | ICD-10-CM | POA: Diagnosis not present

## 2016-04-14 DIAGNOSIS — I2510 Atherosclerotic heart disease of native coronary artery without angina pectoris: Secondary | ICD-10-CM | POA: Diagnosis not present

## 2016-04-14 DIAGNOSIS — I2111 ST elevation (STEMI) myocardial infarction involving right coronary artery: Secondary | ICD-10-CM

## 2016-04-14 DIAGNOSIS — Z79899 Other long term (current) drug therapy: Secondary | ICD-10-CM | POA: Diagnosis not present

## 2016-04-14 DIAGNOSIS — Z955 Presence of coronary angioplasty implant and graft: Secondary | ICD-10-CM | POA: Diagnosis not present

## 2016-04-14 DIAGNOSIS — M8580 Other specified disorders of bone density and structure, unspecified site: Secondary | ICD-10-CM | POA: Diagnosis not present

## 2016-04-14 DIAGNOSIS — Z7982 Long term (current) use of aspirin: Secondary | ICD-10-CM | POA: Diagnosis not present

## 2016-04-16 ENCOUNTER — Encounter (HOSPITAL_COMMUNITY)
Admission: RE | Admit: 2016-04-16 | Discharge: 2016-04-16 | Disposition: A | Discharge status: Home / Self Care | Payer: Medicare Other | Source: Ambulatory Visit | Attending: Interventional Cardiology | Admitting: Interventional Cardiology

## 2016-04-16 DIAGNOSIS — I213 ST elevation (STEMI) myocardial infarction of unspecified site: Secondary | ICD-10-CM | POA: Diagnosis not present

## 2016-04-16 DIAGNOSIS — M8580 Other specified disorders of bone density and structure, unspecified site: Secondary | ICD-10-CM | POA: Diagnosis not present

## 2016-04-16 DIAGNOSIS — Z955 Presence of coronary angioplasty implant and graft: Secondary | ICD-10-CM | POA: Diagnosis not present

## 2016-04-16 DIAGNOSIS — Z79899 Other long term (current) drug therapy: Secondary | ICD-10-CM | POA: Diagnosis not present

## 2016-04-16 DIAGNOSIS — I2510 Atherosclerotic heart disease of native coronary artery without angina pectoris: Secondary | ICD-10-CM | POA: Diagnosis not present

## 2016-04-16 DIAGNOSIS — Z7982 Long term (current) use of aspirin: Secondary | ICD-10-CM | POA: Diagnosis not present

## 2016-04-16 DIAGNOSIS — I2111 ST elevation (STEMI) myocardial infarction involving right coronary artery: Secondary | ICD-10-CM

## 2016-04-18 ENCOUNTER — Encounter (HOSPITAL_COMMUNITY)
Admission: RE | Admit: 2016-04-18 | Discharge: 2016-04-18 | Disposition: A | Discharge status: Home / Self Care | Payer: Medicare Other | Source: Ambulatory Visit | Attending: Interventional Cardiology | Admitting: Interventional Cardiology

## 2016-04-18 VITALS — Ht 65.0 in | Wt 131.8 lb

## 2016-04-18 DIAGNOSIS — Z79899 Other long term (current) drug therapy: Secondary | ICD-10-CM | POA: Diagnosis not present

## 2016-04-18 DIAGNOSIS — M8580 Other specified disorders of bone density and structure, unspecified site: Secondary | ICD-10-CM | POA: Diagnosis not present

## 2016-04-18 DIAGNOSIS — I213 ST elevation (STEMI) myocardial infarction of unspecified site: Secondary | ICD-10-CM | POA: Diagnosis not present

## 2016-04-18 DIAGNOSIS — Z955 Presence of coronary angioplasty implant and graft: Secondary | ICD-10-CM | POA: Diagnosis not present

## 2016-04-18 DIAGNOSIS — I2111 ST elevation (STEMI) myocardial infarction involving right coronary artery: Secondary | ICD-10-CM

## 2016-04-18 DIAGNOSIS — I2510 Atherosclerotic heart disease of native coronary artery without angina pectoris: Secondary | ICD-10-CM | POA: Diagnosis not present

## 2016-04-18 DIAGNOSIS — Z7982 Long term (current) use of aspirin: Secondary | ICD-10-CM | POA: Diagnosis not present

## 2016-04-21 ENCOUNTER — Encounter (HOSPITAL_COMMUNITY)
Admission: RE | Admit: 2016-04-21 | Discharge: 2016-04-21 | Disposition: A | Discharge status: Home / Self Care | Payer: Medicare Other | Source: Ambulatory Visit | Attending: Interventional Cardiology | Admitting: Interventional Cardiology

## 2016-04-21 DIAGNOSIS — I2510 Atherosclerotic heart disease of native coronary artery without angina pectoris: Secondary | ICD-10-CM | POA: Diagnosis not present

## 2016-04-21 DIAGNOSIS — I2111 ST elevation (STEMI) myocardial infarction involving right coronary artery: Secondary | ICD-10-CM

## 2016-04-21 DIAGNOSIS — K219 Gastro-esophageal reflux disease without esophagitis: Secondary | ICD-10-CM | POA: Insufficient documentation

## 2016-04-21 DIAGNOSIS — Z955 Presence of coronary angioplasty implant and graft: Secondary | ICD-10-CM

## 2016-04-21 DIAGNOSIS — Z79899 Other long term (current) drug therapy: Secondary | ICD-10-CM | POA: Diagnosis not present

## 2016-04-21 DIAGNOSIS — M8580 Other specified disorders of bone density and structure, unspecified site: Secondary | ICD-10-CM | POA: Insufficient documentation

## 2016-04-21 DIAGNOSIS — Z7982 Long term (current) use of aspirin: Secondary | ICD-10-CM | POA: Diagnosis not present

## 2016-04-21 DIAGNOSIS — I213 ST elevation (STEMI) myocardial infarction of unspecified site: Secondary | ICD-10-CM | POA: Diagnosis not present

## 2016-04-23 ENCOUNTER — Encounter (HOSPITAL_COMMUNITY)
Admission: RE | Admit: 2016-04-23 | Discharge: 2016-04-23 | Disposition: A | Discharge status: Home / Self Care | Payer: Medicare Other | Source: Ambulatory Visit | Attending: Interventional Cardiology | Admitting: Interventional Cardiology

## 2016-04-23 ENCOUNTER — Other Ambulatory Visit: Payer: Self-pay | Admitting: Internal Medicine

## 2016-04-23 DIAGNOSIS — Z79899 Other long term (current) drug therapy: Secondary | ICD-10-CM | POA: Diagnosis not present

## 2016-04-23 DIAGNOSIS — Z955 Presence of coronary angioplasty implant and graft: Secondary | ICD-10-CM | POA: Diagnosis not present

## 2016-04-23 DIAGNOSIS — M8580 Other specified disorders of bone density and structure, unspecified site: Secondary | ICD-10-CM | POA: Diagnosis not present

## 2016-04-23 DIAGNOSIS — I213 ST elevation (STEMI) myocardial infarction of unspecified site: Secondary | ICD-10-CM | POA: Diagnosis not present

## 2016-04-23 DIAGNOSIS — I2510 Atherosclerotic heart disease of native coronary artery without angina pectoris: Secondary | ICD-10-CM | POA: Diagnosis not present

## 2016-04-23 DIAGNOSIS — Z7982 Long term (current) use of aspirin: Secondary | ICD-10-CM | POA: Diagnosis not present

## 2016-04-23 DIAGNOSIS — I2111 ST elevation (STEMI) myocardial infarction involving right coronary artery: Secondary | ICD-10-CM

## 2016-04-25 ENCOUNTER — Encounter (HOSPITAL_COMMUNITY)
Admission: RE | Admit: 2016-04-25 | Discharge: 2016-04-25 | Disposition: A | Discharge status: Home / Self Care | Payer: Medicare Other | Source: Ambulatory Visit | Attending: Interventional Cardiology | Admitting: Interventional Cardiology

## 2016-04-25 DIAGNOSIS — Z955 Presence of coronary angioplasty implant and graft: Secondary | ICD-10-CM

## 2016-04-25 DIAGNOSIS — Z7982 Long term (current) use of aspirin: Secondary | ICD-10-CM | POA: Diagnosis not present

## 2016-04-25 DIAGNOSIS — I2111 ST elevation (STEMI) myocardial infarction involving right coronary artery: Secondary | ICD-10-CM

## 2016-04-25 DIAGNOSIS — M8580 Other specified disorders of bone density and structure, unspecified site: Secondary | ICD-10-CM | POA: Diagnosis not present

## 2016-04-25 DIAGNOSIS — Z79899 Other long term (current) drug therapy: Secondary | ICD-10-CM | POA: Diagnosis not present

## 2016-04-25 DIAGNOSIS — I213 ST elevation (STEMI) myocardial infarction of unspecified site: Secondary | ICD-10-CM | POA: Diagnosis not present

## 2016-04-25 DIAGNOSIS — I2510 Atherosclerotic heart disease of native coronary artery without angina pectoris: Secondary | ICD-10-CM | POA: Diagnosis not present

## 2016-04-25 NOTE — Progress Notes (Signed)
Pt returned to rehab today.  Pt with complaint of feeling shortness of breath and did not sleep well last night.  Pt with lungs clear, o2 sat 100% and weight stable with minimal change from previous session.  Pt remarked that she felt stressed and wondered could that make her feel Simone of breath.  Asked pt what she felt stressed about, pt answered she and her daughters relationship.  Relationship has been rocky for quite some time. Pt has made multiple attempts to reach out to daughter but has received no response. As a result of this pt has been unable to sleep and felt stressed today. Pt given emotional support and suggestions that counseling with a neutral non biased person would be helpful to resolve their issues.  Pt felt better.  Will continue to follow up. Alanson Aly, BSN

## 2016-04-28 ENCOUNTER — Ambulatory Visit (INDEPENDENT_AMBULATORY_CARE_PROVIDER_SITE_OTHER): Payer: Medicare Other | Admitting: Internal Medicine

## 2016-04-28 ENCOUNTER — Encounter: Payer: Self-pay | Admitting: Internal Medicine

## 2016-04-28 ENCOUNTER — Other Ambulatory Visit: Payer: Self-pay | Admitting: Internal Medicine

## 2016-04-28 ENCOUNTER — Encounter (HOSPITAL_COMMUNITY)
Admission: RE | Admit: 2016-04-28 | Discharge: 2016-04-28 | Disposition: A | Discharge status: Home / Self Care | Payer: Medicare Other | Source: Ambulatory Visit | Attending: Interventional Cardiology | Admitting: Interventional Cardiology

## 2016-04-28 VITALS — BP 102/64 | HR 76

## 2016-04-28 VITALS — BP 114/70 | HR 70 | Temp 98.2°F | Ht 65.0 in | Wt 133.0 lb

## 2016-04-28 DIAGNOSIS — I2111 ST elevation (STEMI) myocardial infarction involving right coronary artery: Secondary | ICD-10-CM

## 2016-04-28 DIAGNOSIS — I241 Dressler's syndrome: Secondary | ICD-10-CM

## 2016-04-28 DIAGNOSIS — L309 Dermatitis, unspecified: Secondary | ICD-10-CM | POA: Diagnosis not present

## 2016-04-28 DIAGNOSIS — Z955 Presence of coronary angioplasty implant and graft: Secondary | ICD-10-CM

## 2016-04-28 DIAGNOSIS — I10 Essential (primary) hypertension: Secondary | ICD-10-CM

## 2016-04-28 DIAGNOSIS — Z1231 Encounter for screening mammogram for malignant neoplasm of breast: Secondary | ICD-10-CM

## 2016-04-28 NOTE — Patient Instructions (Signed)
Call or return to clinic prn if these symptoms worsen or fail to improve as anticipated.

## 2016-04-28 NOTE — Progress Notes (Signed)
Incomplete Session Note  Patient Details  Name: JASMINEROSE BITTNER MRN: [id number] Date of Birth: 02-19-1932 Referring Provider:        CARDIAC REHAB PHASE II ORIENTATION from 01/24/2016 in [facility] CARDIAC [facility]   Referring Provider  Verdis Prime MD      Jerilee Hoh Querry did not complete her rehab session.  Yeilani was due to graduated today. Amulya was noted to have a macro papular rash on the right lower chin neck and a few spots on her upper chest. I advised that Rikayla not exercise today. Dr Ninfa Linden office called. Appointment made for Evalina to see Dr Hilda Lias today at 11:00. VSS. Sao2 98% on room air. Riena complained of itching at the rash areas otherwise patient asymptomatic.

## 2016-04-28 NOTE — Progress Notes (Signed)
Pre visit review using our clinic review tool, if applicable. No additional management support is needed unless otherwise documented below in the visit note. 

## 2016-04-28 NOTE — Progress Notes (Signed)
Subjective:    Patient ID: Nancy Neal, female    DOB: Dec 04, 1931, 82 y.o.   MRN: [id number]  HPI 82 year old patient who has essential hypertension.  She also has a history of coronary artery disease and is undergoing cardiac rehabilitation.  She was seen and rehabilitation earlier today and referred for evaluation due to a facial rash.  This has been present involving the right facial area.  Neck and left upper anterior chest.  The rash is very pruritic.  No new medications.  Apparently the staff at the cardiac rehabilitation clinic concerned about possible adverse drug effect.  The patient has been doing some outdoor activities-mainly watering plants.  No pets.  No obvious exposure history  Past Medical History  Diagnosis Date  . ALLERGIC RHINITIS 06/02/2007  . OSTEOPENIA 06/02/2007  . VAGINITIS, ATROPHIC, POSTMENOPAUSAL 12/06/2008  . VERTIGO 02/16/2009  . Glaucoma 1-12    open angle  . Coronary artery disease 11/2015    a. 11/2015: Inferior STEMI w/ Promus DES to RCA.    . Diverticulosis of colon 2003    by 2003 and 2008 colonoscopy.   Marland Kitchen History of blood transfusion 1971    "when I had hysterectomy"  . GERD (gastroesophageal reflux disease)   . Arthritis     "maybe in my right knee" (12/19/2015)  . Myocardial infarction [facility]) 11/2015     Social History   Social History  . Marital Status: Widowed    Spouse Name: N/A  . Number of Children: N/A  . Years of Education: N/A   Occupational History  . Not on file.   Social History Main Topics  . Smoking status: Never Smoker   . Smokeless tobacco: Never Used  . Alcohol Use: 4.2 oz/week    7 Glasses of wine per week  . Drug Use: No  . Sexual Activity: No   Other Topics Concern  . Not on file   Social History Narrative    Past Surgical History  Procedure Laterality Date  . Cardiac catheterization N/A 12/15/2015    Procedure: Left Heart Cath and Coronary Angiography;  Surgeon: Lyn Records, MD;  Location: [facility] INVASIVE CV  LAB;  Service: Cardiovascular;  Laterality: N/A;  . Cardiac catheterization N/A 12/15/2015    Procedure: Coronary Stent Intervention;  Surgeon: Lyn Records, MD;  Location: [facility] INVASIVE CV LAB;  Service: Cardiovascular;  Laterality: N/A;  . Shoulder arthroscopy w/ rotator cuff repair Right 11/2014  . Appendectomy  1971  . Total abdominal hysterectomy  1971  . Hemorrhoid surgery  ~ 1968  . Eye surgery Bilateral     "laser tx for glaucoma"  . Colonoscopy N/A 12/20/2015    Procedure: COLONOSCOPY;  Surgeon: Hilarie Fredrickson, MD;  Location: [facility] ENDOSCOPY;  Service: Endoscopy;  Laterality: N/A;    Family History  Problem Relation Age of Onset  . Suicidality Mother   . Heart disease Father   . Arthritis Brother     Allergies  Allergen Reactions  . Acetaminophen Nausea Only    Pain Medicines make her sick  . Codeine Sulfate Nausea And Vomiting  . Meclizine Other (See Comments)    Other reaction(s): Other (See Comments) glaucoma glaucoma    Current Outpatient Prescriptions on File Prior to Visit  Medication Sig Dispense Refill  . acetaminophen (TYLENOL) 325 MG tablet Take 2 tablets (650 mg total) by mouth every 4 (four) hours as needed for headache or mild pain.    Marland Kitchen aspirin 81 MG chewable tablet Chew  1 tablet (81 mg total) by mouth daily.    Marland Kitchen atorvastatin (LIPITOR) 40 MG tablet Take 40 mg by mouth daily.    . carboxymethylcellulose (REFRESH PLUS) 0.5 % SOLN Place 1 drop into both eyes 3 (three) times daily as needed (dry eyes).     . isosorbide mononitrate (IMDUR) 30 MG 24 hr tablet TAKE 1 TABLET(30 MG) BY MOUTH DAILY 30 tablet 5  . isosorbide mononitrate (IMDUR) 30 MG 24 hr tablet TAKE 1 TABLET(30 MG) BY MOUTH DAILY 90 tablet 2  . metoprolol tartrate (LOPRESSOR) 25 MG tablet Take 0.5 tablets (12.5 mg total) by mouth 2 (two) times daily. 90 tablet 3  . Multiple Vitamin (MULTIVITAMIN WITH MINERALS) TABS tablet Take 1 tablet by mouth daily.    . nitroGLYCERIN (NITROSTAT) 0.4 MG SL tablet Place  1 tablet (0.4 mg total) under the tongue every 5 (five) minutes as needed for chest pain. 25 tablet 2  . OVER THE COUNTER MEDICATION Take 1 tablet by mouth daily. Over the counter antihistamine    . ticagrelor (BRILINTA) 90 MG TABS tablet Take 1 tablet (90 mg total) by mouth 2 (two) times daily. 180 tablet 3  . VITAMIN E PO Take 1 capsule by mouth daily.      No current facility-administered medications on file prior to visit.    BP 114/70 mmHg  Pulse 70  Temp(Src) 98.2 F (36.8 C) (Oral)  Ht  (1.651 m)  Wt 133 lb (60.328 kg)  BMI 22.13 kg/m2      Review of Systems  Skin: Positive for rash.       Objective:   Physical Exam  Constitutional: She appears well-developed and well-nourished. No distress.  Blood pressure low normal  Skin:  Mild maculopapular rash with excoriations over the right face, right lateral neck and upper anterior chest wall          Assessment & Plan:   Facial dermatitis.  Most consistent with a mild contact dermatitis.  Doubt adverse drug effect. Local skin care discussed  Essential hypertension, stable  Rogelia Boga, MD

## 2016-04-30 ENCOUNTER — Encounter (HOSPITAL_COMMUNITY)
Admission: RE | Admit: 2016-04-30 | Discharge: 2016-04-30 | Disposition: A | Discharge status: Home / Self Care | Payer: Medicare Other | Source: Ambulatory Visit | Attending: Interventional Cardiology | Admitting: Interventional Cardiology

## 2016-04-30 VITALS — Ht 65.0 in | Wt 130.7 lb

## 2016-04-30 DIAGNOSIS — Z955 Presence of coronary angioplasty implant and graft: Secondary | ICD-10-CM

## 2016-04-30 DIAGNOSIS — Z79899 Other long term (current) drug therapy: Secondary | ICD-10-CM | POA: Diagnosis not present

## 2016-04-30 DIAGNOSIS — M8580 Other specified disorders of bone density and structure, unspecified site: Secondary | ICD-10-CM | POA: Diagnosis not present

## 2016-04-30 DIAGNOSIS — I2510 Atherosclerotic heart disease of native coronary artery without angina pectoris: Secondary | ICD-10-CM | POA: Diagnosis not present

## 2016-04-30 DIAGNOSIS — I2111 ST elevation (STEMI) myocardial infarction involving right coronary artery: Secondary | ICD-10-CM

## 2016-04-30 DIAGNOSIS — I213 ST elevation (STEMI) myocardial infarction of unspecified site: Secondary | ICD-10-CM | POA: Diagnosis not present

## 2016-04-30 DIAGNOSIS — Z7982 Long term (current) use of aspirin: Secondary | ICD-10-CM | POA: Diagnosis not present

## 2016-04-30 NOTE — Progress Notes (Signed)
Discharge Summary  Patient Details  Name: Nancy Neal MRN: [id number] Date of Birth: 1932-07-04 Referring Provider:   Flowsheet Row CARDIAC REHAB PHASE II ORIENTATION from 01/24/2016 in [facility]  Referring Provider  Nancy Schick MD       Number of Visits: 36  Reason for Discharge:  Patient independent in their exercise.  Smoking History:  History  Smoking Status  . Never Smoker  Smokeless Tobacco  . Never Used    Diagnosis:  Status post insertion of drug-eluting stent into right coronary artery for coronary artery disease  ST elevation (STEMI) myocardial infarction involving right coronary artery ([place])  ADL UCSD:   Initial Exercise Prescription:     Initial Exercise Prescription - 01/31/16 1300      Date of Initial Exercise RX and Referring Provider   Date 01/24/16   Referring Provider Nancy Schick MD     Treadmill   MPH 2   Grade 1   Minutes 10   METs 2.81     Bike   Level 0.6   Minutes 10   METs 2.73     NuStep   Level 2   Minutes 10   METs 2     Prescription Details   Frequency (times per week) 3     Intensity   THRR 40-80% of Max Heartrate 86-111   Ratings of Perceived Exertion 11-13     Progression   Progression Continue to progress workloads to maintain intensity without signs/symptoms of physical distress.     Resistance Training   Training Prescription Yes   Weight 2lbs   Reps 10-12      Discharge Exercise Prescription (Final Exercise Prescription Changes):     Exercise Prescription Changes - 05/02/16 1100      Exercise Review   Progression Yes     Response to Exercise   Blood Pressure (Admit) 114/60   Blood Pressure (Exercise) 134/74   Blood Pressure (Exit) 96/60   Heart Rate (Admit) 74 bpm   Heart Rate (Exercise) 108 bpm   Heart Rate (Exit) 74 bpm   Rating of Perceived Exertion (Exercise) 13   Symptoms none   Comments Home Exercise Given 02/04/16   Duration Progress to 30 minutes of  continuous aerobic without signs/symptoms of physical distress   Intensity THRR unchanged     Progression   Progression Continue to progress workloads to maintain intensity without signs/symptoms of physical distress.   Average METs 5.1     Resistance Training   Training Prescription Yes   Weight 2 lbs.   Reps 10-12     Interval Training   Interval Training No     Treadmill   MPH 3   Grade 4   Minutes 10   METs 4.95     Bike   Level 1   Minutes 10   METs 4.13     NuStep   Level 4   Minutes 10   METs 6.3     Home Exercise Plan   Plans to continue exercise at [facility] (comment)   Frequency Add 4 additional days to program exercise sessions.      Functional Capacity:     6 Minute Walk    Row Name 01/24/16 0952 04/18/16 1432       6 Minute Walk   Phase Initial Discharge    Distance 1535 feet 2019 feet    Distance % Change  - 31.53 %    Walk Time 6  minutes 6 minutes    # of Rest Breaks 0 0    MPH 2.91 3.82    METS 2.73 3.75    RPE 7 15    VO2 Peak 9.57 13.11    Symptoms No  -    Resting HR 72 bpm 74 bpm    Resting BP 124/66 124/78    Max Ex. HR 79 bpm 101 bpm    Max Ex. BP 126/64 120/86    2 Minute Post BP 116/64 108/78       Psychological, QOL, Others - Outcomes: PHQ 2/9: Depression screen Nancy Neal 2/9 04/30/2016 01/30/2016 01/15/2016 01/08/2015 01/04/2014  Decreased Interest 0 0 0 0 0  Down, Depressed, Hopeless 0 0 0 0 0  PHQ - 2 Score 0 0 0 0 0    Quality of Life:     Quality of Life - 04/28/16 1116      Quality of Life Scores   Health/Function Pre 28.62 %   Health/Function Post 28.12 %   Health/Function % Change -1.75 %   Socioeconomic Pre 29.58 %   Socioeconomic Post 30 %   Socioeconomic % Change  1.42 %   Psych/Spiritual Pre 27.43 %   Psych/Spiritual Post 26.5 %   Psych/Spiritual % Change -3.39 %   Family Pre 26.38 %   Family Post 21.75 %   Family % Change -17.55 %   GLOBAL Pre 28.23 %   GLOBAL Post 27.35 %   GLOBAL % Change  -3.12 %      Personal Goals: Goals established at orientation with interventions provided to work toward goal.     Personal Goals and Risk Factors at Admission - 04/30/16 1107      Core Components/Risk Factors/Patient Goals on Admission   Increase Strength and Stamina Yes   Intervention Provide advice, education, support and counseling about physical activity/exercise needs.;Develop an individualized exercise prescription for aerobic and resistive training based on initial evaluation findings, risk stratification, comorbidities and participant's personal goals.   Expected Outcomes Achievement of increased cardiorespiratory fitness and enhanced flexibility, muscular endurance and strength shown through measurements of functional capacity and personal statement of participant.   Lipids Yes  Pt's cholesterol number are WNL due to her high dose statin.  Will continue to monitor   Expected Outcomes --  Continue to maintain normal cholesterol levels   Stress Yes   Intervention Offer individual and/or small group education and counseling on adjustment to heart disease, stress management and health-related lifestyle change. Teach and support self-help strategies.;Refer participants experiencing significant psychosocial distress to appropriate mental health specialists for further evaluation and treatment. When possible, include family members and significant others in education/counseling sessions.   Expected Outcomes [name] Term: Participant demonstrates changes in health-related behavior, relaxation and other stress management skills, ability to obtain effective social support, and compliance with psychotropic medications if prescribed.;Long Term: Emotional wellbeing is indicated by absence of clinically significant psychosocial distress or social isolation.   Personal Goal Other Yes   Personal Goal "Back to Go", back to gym, learn limits   Intervention Provide exercise prescription for program and  at home/gym   Expected Outcomes Resumed walking 60 minutes daily, return to gym 4 days/week 45 minutes.       Personal Goals Discharge:     Goals and Risk Factor Review    Row Name 02/06/16 1052 03/05/16 1244 04/02/16 1119         Core Components/Risk Factors/Patient Goals Review   Personal Goals Review Increase Strength  and Stamina;Stress  - Other     Review Pt just added hill back into walking routine after reviewing home ex.  Once she is able to make it up successfully, she will feel like strength and stamina will be improved.  She is attending Stress class today. Pt not back at gym yet. Plans to return when she is able to walk uphill without SOB. Pt also using incline on TM to help with this. Pt is doing well with exercise. Pt exercising 6 days/ week for 60 minutes. Pt exercised at gym while on vacation last week. Pt states she's able to walk farther.     Expected Outcomes Able to walk up hill! Walk uphill without SOB and return to gym. Continue regular exercise program and eventually return to gym.        Nutrition & Weight - Outcomes:     Pre Biometrics - 01/24/16 0954      Pre Biometrics   Height 5' 5"  (1.651 m)   Weight 132 lb 11.5 oz (60.2 kg)   Waist Circumference 27 inches   Hip Circumference 36 inches   Waist to Hip Ratio 0.75 %   BMI (Calculated) 22.1   Triceps Skinfold 20 mm   % Body Fat 32.3 %   Grip Strength 25 kg   Flexibility 15 in   Single Leg Stand 26.43 seconds         Post Biometrics - 04/30/16 0921       Post  Biometrics   Height 5' 5"  (1.651 m)   Weight 130 lb 11.7 oz (59.3 kg)   Waist Circumference 28 inches   Hip Circumference 36 inches   Waist to Hip Ratio 0.78 %   BMI (Calculated) 21.8   Triceps Skinfold 18 mm   % Body Fat 32 %   Grip Strength 26 kg   Flexibility 16.5 in   Single Leg Stand 17.91 seconds      Nutrition:     Nutrition Therapy & Goals - 01/24/16 1420      Nutrition Therapy   Diet Therapeutic Lifestyle Changes      Personal Nutrition Goals   Personal Goal #1 Maintain current wt around 132 lb     Intervention Plan   Intervention Prescribe, educate and counsel regarding individualized specific dietary modifications aiming towards targeted core components such as weight, hypertension, lipid management, diabetes, heart failure and other comorbidities.   Expected Outcomes [place] Term Goal: Understand basic principles of dietary content, such as calories, fat, sodium, cholesterol and nutrients.;Long Term Goal: Adherence to prescribed nutrition plan.      Nutrition Discharge:     Nutrition Assessments - 04/24/16 1227      MEDFICTS Scores   Pre Score 37   Post Score 15   Score Difference -22      Education Questionnaire Score:     Knowledge Questionnaire Score - 04/23/16 1459      Knowledge Questionnaire Score   Pre Score 20/24   Post Score 20/24      Goals reviewed with patient; copy given to patient.Pt graduated from cardiac rehab program today with completion of 36 exercise sessions in Phase II. Pt maintained good attendance and progressed nicely during his participation in rehab as evidenced by increased MET level.   Medication list reconciled. Repeat  PHQ score-0  .  Pt has made significant lifestyle changes and should be commended for her success. Pt feels she has achieved her goals during cardiac rehab.   Pt plans  to continue exercise at the [facility] 4 days a week. We are very proud of Nohelia's progress.Barnet Pall, RN,BSN 05/15/2016 2:42 PM

## 2016-05-02 ENCOUNTER — Encounter (HOSPITAL_COMMUNITY): Payer: Medicare Other

## 2016-05-05 ENCOUNTER — Encounter (HOSPITAL_COMMUNITY): Payer: Medicare Other

## 2016-05-20 ENCOUNTER — Ambulatory Visit: Payer: Medicare Other | Admitting: Interventional Cardiology

## 2016-05-21 IMAGING — NM NM MISC PROCEDURE
6 series · 36 of 36 positions shown · non-contrast
Comparison: none

[Series 1: wbr rest · 6.40mm/px · 6 of 64 frames shown]
[frame 6/64]
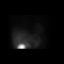
[frame 16/64]
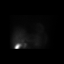
[frame 27/64]
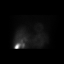
[frame 38/64]
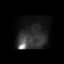
[frame 48/64]
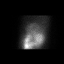
[frame 59/64]
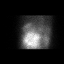

[Series 1: wbr_r-proj_st wbr rest · 6.40mm/px · 6 of 64 frames shown]
[frame 6/64]
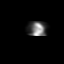
[frame 16/64]
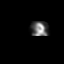
[frame 27/64]
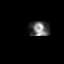
[frame 38/64]
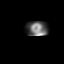
[frame 48/64]
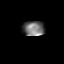
[frame 59/64]
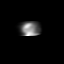

[Series 2: wbr_s-proj_st wbr stress-gsp · 6.40mm/px · 6 of 512 frames shown]
[frame 43/512]
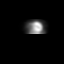
[frame 128/512]
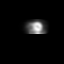
[frame 214/512]
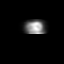
[frame 299/512]
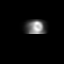
[frame 384/512]
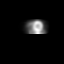
[frame 470/512]
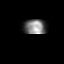

[Series 2: wbr stress-gsp · 6.40mm/px · 6 of 512 frames shown]
[frame 43/512]
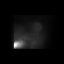
[frame 128/512]
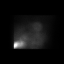
[frame 214/512]
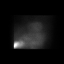
[frame 299/512]
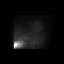
[frame 384/512]
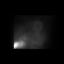
[frame 470/512]
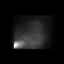

[Series 3: wbr_s-proj_st wbr stress-sum-em · 6.40mm/px · 6 of 64 frames shown]
[frame 6/64]
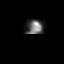
[frame 16/64]
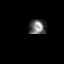
[frame 27/64]
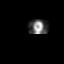
[frame 38/64]
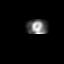
[frame 48/64]
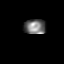
[frame 59/64]
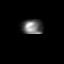

[Series 3: wbr stress-sum-em · 6.40mm/px · 6 of 64 frames shown]
[frame 6/64]
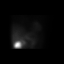
[frame 16/64]
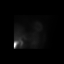
[frame 27/64]
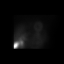
[frame 38/64]
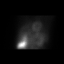
[frame 48/64]
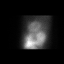
[frame 59/64]
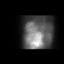

[36 of 36 positions shown; findings below may reference images not displayed]

Canned report from images found in remote index.

Refer to host system for actual result text.

## 2016-05-27 ENCOUNTER — Ambulatory Visit
Admission: RE | Admit: 2016-05-27 | Discharge: 2016-05-27 | Disposition: A | Discharge status: Home / Self Care | Payer: Medicare Other | Source: Ambulatory Visit | Attending: Internal Medicine | Admitting: Internal Medicine

## 2016-05-27 DIAGNOSIS — Z1231 Encounter for screening mammogram for malignant neoplasm of breast: Secondary | ICD-10-CM | POA: Diagnosis not present

## 2016-06-03 ENCOUNTER — Ambulatory Visit (INDEPENDENT_AMBULATORY_CARE_PROVIDER_SITE_OTHER): Payer: Medicare Other | Admitting: Interventional Cardiology

## 2016-06-03 ENCOUNTER — Encounter: Payer: Self-pay | Admitting: Interventional Cardiology

## 2016-06-03 VITALS — BP 120/70 | HR 80 | Ht 65.0 in | Wt 133.8 lb

## 2016-06-03 DIAGNOSIS — E785 Hyperlipidemia, unspecified: Secondary | ICD-10-CM

## 2016-06-03 DIAGNOSIS — I241 Dressler's syndrome: Secondary | ICD-10-CM | POA: Diagnosis not present

## 2016-06-03 DIAGNOSIS — I2510 Atherosclerotic heart disease of native coronary artery without angina pectoris: Secondary | ICD-10-CM | POA: Diagnosis not present

## 2016-06-03 DIAGNOSIS — I10 Essential (primary) hypertension: Secondary | ICD-10-CM | POA: Diagnosis not present

## 2016-06-03 DIAGNOSIS — R0789 Other chest pain: Secondary | ICD-10-CM | POA: Diagnosis not present

## 2016-06-03 MED ORDER — ATORVASTATIN CALCIUM 20 MG PO TABS: 20.0000 mg | ORAL_TABLET | Freq: Every day | ORAL | 6 refills | Status: DC

## 2016-06-03 NOTE — Patient Instructions (Signed)
Medication Instructions:   STOP ATORVASTATIN X ONE MONTH  AFTER ONE MONTH START ATORVASTATIN 20 MG ONCE DAILY  Labwork:  Your physician recommends that you return for lab work in: MID-October= DO NOT EAT PRIOR TO LAB WORK  Follow-Up:  Your physician wants you to follow-up in: 6 MONTHS WITH DR Marlou Starks will receive a reminder letter in the mail two months in advance. If you don't receive a letter, please call our office to schedule the follow-up appointment.   If you need a refill on your cardiac medications before your next appointment, please call your pharmacy.

## 2016-06-03 NOTE — Progress Notes (Signed)
Cardiology Office Note    Date:  06/03/2016   ID:  Nancy Neal, DOB 1932/08/10, MRN [id number]  PCP:  Rogelia BogaKWIATKOWSKI,PETER FRANK, MD  Cardiologist: Lesleigh NoeHenry W Ankit Degregorio III, MD   Chief Complaint  Patient presents with  . Coronary Artery Disease    History of Present Illness:  Nancy Neal is a 82 y.o. female follow-up of CAD, hyperlipidemia, and history of myocardial infarction.  She had stent implantation during acute inferior infarction in February. She was readmitted because of musculoskeletal chest pain. She has residual disease is being treated with medication. Risk factor modification is being attempted with high-dose statin therapy.  Near continuous parasternal chest soreness. This palpable tenderness.    Past Medical History:  Diagnosis Date  . ALLERGIC RHINITIS 06/02/2007  . Arthritis    "maybe in my right knee" (12/19/2015)  . Coronary artery disease 11/2015   a. 11/2015: Inferior STEMI w/ Promus DES to RCA.    . Diverticulosis of colon 2003   by 2003 and 2008 colonoscopy.   Marland Kitchen. GERD (gastroesophageal reflux disease)   . Glaucoma 1-12   open angle  . History of blood transfusion 1971   "when I had hysterectomy"  . Myocardial infarction (HCC) 11/2015  . OSTEOPENIA 06/02/2007  . VAGINITIS, ATROPHIC, POSTMENOPAUSAL 12/06/2008  . VERTIGO 02/16/2009    Past Surgical History:  Procedure Laterality Date  . APPENDECTOMY  1971  . CARDIAC CATHETERIZATION N/A 12/15/2015   Procedure: Left Heart Cath and Coronary Angiography;  Surgeon: Lyn RecordsHenry W Olusegun Gerstenberger, MD;  [facility];  Service: Cardiovascular;  Laterality: N/A;  . CARDIAC CATHETERIZATION N/A 12/15/2015   Procedure: Coronary Stent Intervention;  Surgeon: Lyn RecordsHenry W Murrel Freet, MD;  [facility];  Service: Cardiovascular;  Laterality: N/A;  . COLONOSCOPY N/A 12/20/2015   Procedure: COLONOSCOPY;  Surgeon: Hilarie FredricksonJohn N Perry, MD;  [facility];  Service: Endoscopy;  Laterality: N/A;  . EYE SURGERY Bilateral    "laser tx for glaucoma"  . HEMORRHOID SURGERY  ~ 1968  . SHOULDER ARTHROSCOPY W/ ROTATOR CUFF REPAIR Right 11/2014  . TOTAL ABDOMINAL HYSTERECTOMY  1971    Current Medications: Outpatient Medications Prior to Visit  Medication Sig Dispense Refill  . acetaminophen (TYLENOL) 325 MG tablet Take 2 tablets (650 mg total) by mouth every 4 (four) hours as needed for headache or mild pain.    Marland Kitchen. aspirin 81 MG chewable tablet Chew 1 tablet (81 mg total) by mouth daily.    . carboxymethylcellulose (REFRESH PLUS) 0.5 % SOLN Place 1 drop into both eyes 3 (three) times daily as needed (dry eyes).     . isosorbide mononitrate (IMDUR) 30 MG 24 hr tablet TAKE 1 TABLET(30 MG) BY MOUTH DAILY 90 tablet 2  . metoprolol tartrate (LOPRESSOR) 25 MG tablet Take 0.5 tablets (12.5 mg total) by mouth 2 (two) times daily. 90 tablet 3  . Multiple Vitamin (MULTIVITAMIN WITH MINERALS) TABS tablet Take 1 tablet by mouth daily.    . nitroGLYCERIN (NITROSTAT) 0.4 MG SL tablet Place 1 tablet (0.4 mg total) under the tongue every 5 (five) minutes as needed for chest pain. 25 tablet 2  . OVER THE COUNTER MEDICATION Take 1 tablet by mouth daily. Over the counter antihistamine    . ticagrelor (BRILINTA) 90 MG TABS tablet Take 1 tablet (90 mg total) by mouth 2 (two) times daily. 180 tablet 3  . VITAMIN E PO Take 1 capsule by mouth daily.     Marland Kitchen. atorvastatin (LIPITOR) 40  MG tablet Take 40 mg by mouth daily.    . isosorbide mononitrate (IMDUR) 30 MG 24 hr tablet TAKE 1 TABLET(30 MG) BY MOUTH DAILY 30 tablet 5   No facility-administered medications prior to visit.      Allergies:   Codeine sulfate; Meclizine; and Acetaminophen   Social History   Social History  . Marital status: Widowed    Spouse name: N/A  . Number of children: N/A  . Years of education: N/A   Social History Main Topics  . Smoking status: Never Smoker  . Smokeless tobacco: Never Used  . Alcohol use 4.2 oz/week    7 Glasses of wine per week  . Drug use:  No  . Sexual activity: No   Other Topics Concern  . None   Social History Narrative  . None     Family History:  The patient's family history includes Arthritis in her brother; Heart disease in her father; Suicidality in her mother.   ROS:   Please see the history of present illness.    Hearing loss, snoring, easy bruising, musculoskeletal chest pain.  All other systems reviewed and are negative.   PHYSICAL EXAM:   VS:  BP 120/70   Pulse 80   Ht 5\' 5"  (1.651 m)   Wt 133 lb 12.8 oz (60.7 kg)   BMI 22.27 kg/m    GEN: Well nourished, well developed, in no acute distress  HEENT: normal  Neck: no JVD, carotid bruits, or masses Cardiac: RRR; no murmurs, rubs, or gallops,no edema  Respiratory:  clear to auscultation bilaterally, normal work of breathing GI: soft, nontender, nondistended, + BS MS: no deformity or atrophy  Skin: warm and dry, no rash Neuro:  Alert and Oriented x 3, Strength and sensation are intact Psych: euthymic mood, full affect  Wt Readings from Last 3 Encounters:  06/03/16 133 lb 12.8 oz (60.7 kg)  04/30/16 130 lb 11.7 oz (59.3 kg)  04/28/16 133 lb (60.3 kg)      Studies/Labs Reviewed:   EKG:  EKG  Not performed.  Recent Labs: 12/15/2015: B Natriuretic Peptide 137.8; TSH 0.466 02/15/2016: ALT 28 02/28/2016: BUN 24; Creatinine, Ser 1.00; Hemoglobin 11.7; Platelets 199; Potassium 4.1; Sodium 142   Lipid Panel    Component Value Date/Time   CHOL 118 (L) 02/15/2016 1608   TRIG 140 02/15/2016 1608   HDL 55 02/15/2016 1608   CHOLHDL 2.1 02/15/2016 1608   VLDL 28 02/15/2016 1608   LDLCALC 35 02/15/2016 1608   LDLDIRECT 123.2 12/18/2011 0954    Additional studies/ records that were reviewed today include:  None. Please note the very low LDL noted above, 35.    ASSESSMENT:    1. CAD - residual LAD disease   2. Essential hypertension   3. Hyperlipidemia   4. Other chest pain      PLAN:  In order of problems listed above:  1. Stable  after stenting of the right coronary and all medical therapy for residual disease. No change in the current treatment regimen. 2. Controlled. Low salt diet discussed. 3. Statin therapy may be excessive. We will discontinue her statin for one month and then resume atorvastatin 20 mg per day instead of 80. A lipid panel will be done 6 weeks later. She will monitor whether or not the parasternal chest discomfort resolves off of statin therapy. 4. Felt to be musculoskeletal and possibly related to statin therapy. Statin holiday as noted above.    Medication Adjustments/Labs and Tests Ordered:  Current medicines are reviewed at length with the patient today.  Concerns regarding medicines are outlined above.  Medication changes, Labs and Tests ordered today are listed in the Patient Instructions below. Patient Instructions  Medication Instructions:   STOP ATORVASTATIN X ONE MONTH  AFTER ONE MONTH START ATORVASTATIN 20 MG ONCE DAILY  Labwork:  Your physician recommends that you return for lab work in: MID-October= DO NOT EAT PRIOR TO LAB WORK  Follow-Up:  Your physician wants you to follow-up in: 6 MONTHS WITH DR Marlou Starks will receive a reminder letter in the mail two months in advance. If you don't receive a letter, please call our office to schedule the follow-up appointment.   If you need a refill on your cardiac medications before your next appointment, please call your pharmacy.     Signed, [name], MD  06/03/2016 9:01 AM    [facility] [address] [place], [place], [place]  [address] Phone: [phone]; Fax: [phone]

## 2016-07-04 ENCOUNTER — Telehealth: Payer: Self-pay | Admitting: Interventional Cardiology

## 2016-07-04 NOTE — Telephone Encounter (Signed)
Follow Up:   Pt said she was told to call back today, this is concerning her medicine.

## 2016-07-04 NOTE — Telephone Encounter (Signed)
Called patient about her message. Patient stated she was to stop Atorvastatin for one month and then restart at a lower dose- Atorvastatin 20 mg by mouth once daily. Patient stated she started back on Atorvastatin yesterday and wanted Dr. Katrinka Blazing to know. Patient stated she was doing fine so far. Encouraged patient to give our office a call if she has any problems with the reduced dose. Confirmed patient's appointment for lab work on August 05, 2016. Informed patient she needs to be fasting. Patient verbalized understanding.

## 2016-08-05 ENCOUNTER — Other Ambulatory Visit: Payer: Medicare Other | Admitting: *Deleted

## 2016-08-05 DIAGNOSIS — E785 Hyperlipidemia, unspecified: Secondary | ICD-10-CM

## 2016-08-05 LAB — HEPATIC FUNCTION PANEL
ALK PHOS: 71 U/L (ref 33–130)
ALT: 13 U/L (ref 6–29)
AST: 18 U/L (ref 10–35)
Albumin: 4.1 g/dL (ref 3.6–5.1)
BILIRUBIN INDIRECT: 1.7 mg/dL — AB (ref 0.2–1.2)
Bilirubin, Direct: 0.3 mg/dL — ABNORMAL HIGH (ref <=0.2)
TOTAL PROTEIN: 6 g/dL — AB (ref 6.1–8.1)
Total Bilirubin: 2 mg/dL — ABNORMAL HIGH (ref 0.2–1.2)

## 2016-08-05 LAB — LIPID PANEL
Cholesterol: 132 mg/dL (ref 125–200)
HDL: 58 mg/dL (ref >=46)
LDL CALC: 59 mg/dL (ref <130)
TRIGLYCERIDES: 74 mg/dL (ref <150)
Total CHOL/HDL Ratio: 2.3 Ratio (ref <=5.0)
VLDL: 15 mg/dL (ref <30)

## 2016-08-06 ENCOUNTER — Telehealth: Payer: Self-pay | Admitting: Interventional Cardiology

## 2016-08-06 NOTE — Telephone Encounter (Signed)
Reviewed labs with pt and advised her that I would call back when Dr. Katrinka Blazing reviewed the labs with any recommendations.

## 2016-08-06 NOTE — Telephone Encounter (Signed)
Pt has questions about lab work results. Please call thanks.

## 2016-09-17 DIAGNOSIS — H5203 Hypermetropia, bilateral: Secondary | ICD-10-CM | POA: Diagnosis not present

## 2016-09-17 DIAGNOSIS — H401111 Primary open-angle glaucoma, right eye, mild stage: Secondary | ICD-10-CM | POA: Diagnosis not present

## 2016-09-17 DIAGNOSIS — H401122 Primary open-angle glaucoma, left eye, moderate stage: Secondary | ICD-10-CM | POA: Diagnosis not present

## 2016-09-17 DIAGNOSIS — H524 Presbyopia: Secondary | ICD-10-CM | POA: Diagnosis not present

## 2016-09-17 DIAGNOSIS — H2513 Age-related nuclear cataract, bilateral: Secondary | ICD-10-CM | POA: Diagnosis not present

## 2016-09-17 DIAGNOSIS — H52203 Unspecified astigmatism, bilateral: Secondary | ICD-10-CM | POA: Diagnosis not present

## 2016-11-21 ENCOUNTER — Telehealth: Payer: Self-pay | Admitting: Interventional Cardiology

## 2016-11-21 NOTE — Telephone Encounter (Signed)
Pt will be one year out from stent placement at the end of February.  She wanted to know if she needed to stay on the Brilinta after she runs out or if she will switch to a new medication or just have it d/c'ed?  Advised pt I will check with Dr. Katrinka Blazing and if he wants her to be seen before changing or d/c'ing meds I can see if we have samples of Brilinta.  Pt's appt is not until 12/24/16.  Pt appreciative for help.

## 2016-11-21 NOTE — Telephone Encounter (Signed)
New Message     Pt could not get follow up appt until 12/24/16 , does she discontinue the Brilinta when she runs out or does she need to continue it.   Call her around 10:30am she is getting a shower

## 2016-11-22 NOTE — Telephone Encounter (Signed)
Brilinta for 1 year, then switch to clopidogrel (Plavix) 75 mg daily

## 2016-11-25 NOTE — Telephone Encounter (Signed)
Spoke with pt and advised her of recommendations per Dr. Katrinka Blazing.  Pt has enough Brilinta to last her 22 more days.  Advised I will send in prescription for Plavix and she can pick it up when it is close to time to switch.  Pt verbalized understanding and was in agreement with this plan.

## 2016-11-28 MED ORDER — CLOPIDOGREL BISULFATE 75 MG PO TABS: 75.0000 mg | ORAL_TABLET | Freq: Every day | ORAL | 3 refills | Status: DC

## 2016-11-28 NOTE — Telephone Encounter (Signed)
Advised pt I had planned on waiting until next week when it was a little closer to time for her to switch medications.  Pt preferred I go ahead and send this in as she needs to look at the cost at local pharmacy vs mail order.  Advised I will send in prescription now and to call our office if it is too expensive locally and we can send it to mail order.  Pt appreciative for assistance.

## 2016-11-28 NOTE — Telephone Encounter (Signed)
Follow up  Pt voiced her medication has not been sent in and wants the nurse to call her once prescription has been sent.  Please f/u

## 2016-12-09 ENCOUNTER — Telehealth: Payer: Self-pay | Admitting: *Deleted

## 2016-12-09 MED ORDER — METOPROLOL TARTRATE 25 MG PO TABS: 12.5000 mg | ORAL_TABLET | Freq: Two times a day (BID) | ORAL | 3 refills | Status: DC

## 2016-12-09 NOTE — Telephone Encounter (Signed)
Patient left a msg on the refill vm requesting a call back to discuss her medications. She does not need a refill she is just wanting to know if she should continue the metoprolol and she stated that she was not given any instructions on the clopidogrel. Patient can be reached at [phone]. Thanks, MI

## 2016-12-09 NOTE — Telephone Encounter (Signed)
Advised pt to continue Metoprolol as prescribed. She said she was out of refills.  New prescription sent to pharmacy.  Pt wanted to make sure she was suppose to take Plavix QD instead of BID since Brilinta was BID.  Advised her Plavix is only QD.  Pt appreciative for assistance.

## 2016-12-18 ENCOUNTER — Encounter: Payer: Self-pay | Admitting: Interventional Cardiology

## 2016-12-24 ENCOUNTER — Encounter: Payer: Self-pay | Admitting: Interventional Cardiology

## 2016-12-24 ENCOUNTER — Ambulatory Visit (INDEPENDENT_AMBULATORY_CARE_PROVIDER_SITE_OTHER): Payer: Medicare Other | Admitting: Interventional Cardiology

## 2016-12-24 VITALS — BP 98/70 | HR 71 | Ht 65.5 in | Wt 134.6 lb

## 2016-12-24 DIAGNOSIS — E784 Other hyperlipidemia: Secondary | ICD-10-CM | POA: Diagnosis not present

## 2016-12-24 DIAGNOSIS — I2510 Atherosclerotic heart disease of native coronary artery without angina pectoris: Secondary | ICD-10-CM

## 2016-12-24 DIAGNOSIS — I10 Essential (primary) hypertension: Secondary | ICD-10-CM

## 2016-12-24 DIAGNOSIS — E7849 Other hyperlipidemia: Secondary | ICD-10-CM

## 2016-12-24 NOTE — Progress Notes (Signed)
Cardiology Office Note    Date:  12/24/2016   ID:  Nancy Neal, DOB 1932-07-14, MRN [id number]  PCP:  Rogelia Boga, MD  Cardiologist: [name], MD   Chief Complaint  Patient presents with  . Coronary Artery Disease    History of Present Illness:  Nancy Neal is a 83 y.o. female follow-up of CAD with known history of acute inferior MI February 2017 and documentation of residual 60-70% mid LAD, normal residual LV function, and hyperlipidemia monitored by her primary physician.  Overall she is doing well. She has multiple questions. No medication side effects. She will soon of her medication regimen can be simplified. She denies chest pain. We again discussed which artery has a stent which artery has obstructive disease and we are monitoring (RCA and LAD respectively). No exertional dyspnea or intolerance. Overall she feels well.     Past Medical History:  Diagnosis Date  . ALLERGIC RHINITIS 06/02/2007  . Arthritis    "maybe in my right knee" (12/19/2015)  . Coronary artery disease 11/2015   a. 11/2015: Inferior STEMI w/ Promus DES to RCA.    . Diverticulosis of colon 2003   by 2003 and 2008 colonoscopy.   Marland Kitchen GERD (gastroesophageal reflux disease)   . Glaucoma 1-12   open angle  . History of blood transfusion 1971   "when I had hysterectomy"  . Myocardial infarction 11/2015  . OSTEOPENIA 06/02/2007  . VAGINITIS, ATROPHIC, POSTMENOPAUSAL 12/06/2008  . VERTIGO 02/16/2009    Past Surgical History:  Procedure Laterality Date  . APPENDECTOMY  1971  . CARDIAC CATHETERIZATION N/A 12/15/2015   Procedure: Left Heart Cath and Coronary Angiography;  Surgeon: Lyn Records, MD;  Location: [facility] INV[facility] CV LAB;  Service: Cardiovascular;  Laterality: N/A;  . CARDIAC CATHETERIZATION N/A 12/15/2015   Procedure: Coronary Stent Intervention;  Surgeon: Lyn Records, MD;  [facility] LAB;  Service: Cardiovascular;  Laterality: N/A;  . COLONOSCOPY N/A 12/20/2015   Procedure: COLONOSCOPY;  Surgeon: Hilarie Fredrickson, MD;  Location: [facility] ENDOSCOPY;  Service: Endoscopy;  Laterality: N/A;  . EYE SURGERY Bilateral    "laser tx for glaucoma"  . HEMORRHOID SURGERY  ~ 1968  . SHOULDER ARTHROSCOPY W/ ROTATOR CUFF REPAIR Right 11/2014  . TOTAL ABDOMINAL HYSTERECTOMY  1971    Current Medications: Outpatient Medications Prior to Visit  Medication Sig Dispense Refill  . acetaminophen (TYLENOL) 325 MG tablet Take 2 tablets (650 mg total) by mouth every 4 (four) hours as needed for headache or mild pain.    Marland Kitchen aspirin 81 MG chewable tablet Chew 1 tablet (81 mg total) by mouth daily.    Marland Kitchen atorvastatin (LIPITOR) 20 MG tablet Take 1 tablet (20 mg total) by mouth daily. 30 tablet 6  . carboxymethylcellulose (REFRESH PLUS) 0.5 % SOLN Place 1 drop into both eyes 3 (three) times daily as needed (dry eyes).     . clopidogrel (PLAVIX) 75 MG tablet Take 1 tablet (75 mg total) by mouth daily. 90 tablet 3  . isosorbide mononitrate (IMDUR) 30 MG 24 hr tablet TAKE 1 TABLET(30 MG) BY MOUTH DAILY 90 tablet 2  . Multiple Vitamin (MULTIVITAMIN WITH MINERALS) TABS tablet Take 1 tablet by mouth daily.    . nitroGLYCERIN (NITROSTAT) 0.4 MG SL tablet Place 1 tablet (0.4 mg total) under the tongue every 5 (five) minutes as needed for chest pain. 25 tablet 2  . OVER THE COUNTER MEDICATION Take 1 tablet by mouth daily. Over  the counter antihistamine    . VITAMIN E PO Take 1 capsule by mouth daily.     . metoprolol tartrate (LOPRESSOR) 25 MG tablet Take 0.5 tablets (12.5 mg total) by mouth 2 (two) times daily. 90 tablet 3   No facility-administered medications prior to visit.      Allergies:   Codeine sulfate; Meclizine; and Acetaminophen   Social History   Social History  . Marital status: Widowed    Spouse name: N/A  . Number of children: N/A  . Years of education: N/A   Social History Main Topics  . Smoking status: Never Smoker  . Smokeless tobacco: Never Used  . Alcohol use 4.2  oz/week    7 Glasses of wine per week  . Drug use: No  . Sexual activity: No   Other Topics Concern  . None   Social History Narrative  . None     Family History:  The patient's family history includes Arthritis in her brother; Heart disease in her father; Suicidality in her mother.   ROS:   Please see the history of present illness.    Occasional bruising. Otherwise no complaints. She is concerned about her diet and if she can have occasional diarrhea Holl[place]. She does have occasional hot flashes.  All other systems reviewed and are negative.   PHYSICAL EXAM:   VS:  BP 98/70 (BP Location: Left Arm)   Pulse 71   Ht 5' 5.5" (1.664 m)   Wt 134 lb 9.6 oz (61.1 kg)   BMI 22.06 kg/m    GEN: Well nourished, well developed, in no acute distress . Slender. Appears her stated age. HEENT: normal  Neck: no JVD, carotid bruits, or masses Cardiac: RRR; no murmurs, rubs, or gallops,no edema  Respiratory:  clear to auscultation bilaterally, normal work of breathing GI: soft, nontender, nondistended, + BS MS: no deformity or atrophy  Skin: warm and dry, no rash Neuro:  Alert and Oriented x 3, Strength and sensation are intact Psych: euthymic mood, full affect  Wt Readings from Last 3 Encounters:  12/24/16 134 lb 9.6 oz (61.1 kg)  06/03/16 133 lb 12.8 oz (60.7 kg)  04/30/16 130 lb 11.7 oz (59.3 kg)      Studies/Labs Reviewed:   EKG:  EKG  Sinus rhythm with small inferior Q waves. Otherwise unremarkable.  Recent Labs: 02/28/2016: BUN 24; Creatinine, Ser 1.00; Hemoglobin 11.7; Platelets 199; Potassium 4.1; Sodium 142 08/05/2016: ALT 13   Lipid Panel    Component Value Date/Time   CHOL 132 08/05/2016 0747   TRIG 74 08/05/2016 0747   HDL 58 08/05/2016 0747   CHOLHDL 2.3 08/05/2016 0747   VLDL 15 08/05/2016 0747   LDLCALC 59 08/05/2016 0747   LDLDIRECT 123.2 12/18/2011 0954    Additional studies/ records that were reviewed today include:    Acute infarct intervention  12/15/2015: Coronary Diagrams   Diagnostic Diagram     Post-Intervention Diagram         ASSESSMENT:    1. CAD - residual LAD disease   2. Essential hypertension   3. Other hyperlipidemia      PLAN:  In order of problems listed above:  1. Stable coronary artery disease with prior RCA stent during acute infarction and intermediate stenosis in the mid LAD. Asymptomatic. Continue aggressive statin therapy with LDL target less than 70. Continue aerobic activity. Caution her to call if exertion related chest discomfort or other complaints. Continue aspirin and Plavix. 2. She really does not  have blood pressure and actually has relatively low pressures today. We will decrease metoprolol to 12.5 mg per day for 7 days and discontinue. At that time if she is feeling well, Imdur will be discontinued. 3. Continue statin that is being monitored by Dr. Kirtland Bouchard. 4.     Medication Adjustments/Labs and Tests Ordered: Current medicines are reviewed at length with the patient today.  Concerns regarding medicines are outlined above.  Medication changes, Labs and Tests ordered today are listed in the Patient Instructions below. Patient Instructions  Medication Instructions:  1) DECREASE your Metoprolol to a half tablet daily for one week, then discontinue. 2) If after stopping Metoprolol, you are feeling fine and not having chest pain, you may stop your Imdur.  Please call me and let me know if you do stop your Imdur.  Labwork: Noen  Testing/Procedures: None  Follow-Up: Your physician wants you to follow-up in: 1 year with Dr. Katrinka Blazing.  You will receive a reminder letter in the mail two months in advance. If you don't receive a letter, please call our office to schedule the follow-up appointment.   Any Other Special Instructions Will Be Listed Below (If Applicable).     If you need a refill on your cardiac medications before your next appointment, please call your pharmacy.       Signed, [name], MD  12/24/2016 12:54 PM    [facility] [address] [place], [place], [place]  [address] Phone: [phone]; Fax: [phone]

## 2016-12-24 NOTE — Patient Instructions (Signed)
Medication Instructions:  1) DECREASE your Metoprolol to a half tablet daily for one week, then discontinue. 2) If after stopping Metoprolol, you are feeling fine and not having chest pain, you may stop your Imdur.  Please call me and let me know if you do stop your Imdur.  Labwork: Noen  Testing/Procedures: None  Follow-Up: Your physician wants you to follow-up in: 1 year with Dr. Katrinka Blazing.  You will receive a reminder letter in the mail two months in advance. If you don't receive a letter, please call our office to schedule the follow-up appointment.   Any Other Special Instructions Will Be Listed Below (If Applicable).     If you need a refill on your cardiac medications before your next appointment, please call your pharmacy.

## 2017-01-01 ENCOUNTER — Other Ambulatory Visit: Payer: Self-pay | Admitting: *Deleted

## 2017-01-01 DIAGNOSIS — E785 Hyperlipidemia, unspecified: Secondary | ICD-10-CM

## 2017-01-01 MED ORDER — ATORVASTATIN CALCIUM 20 MG PO TABS: 20.0000 mg | ORAL_TABLET | Freq: Every day | ORAL | 3 refills | Status: DC

## 2017-01-16 ENCOUNTER — Encounter: Payer: Medicare Other | Admitting: Internal Medicine

## 2017-01-23 ENCOUNTER — Telehealth: Payer: Self-pay | Admitting: Interventional Cardiology

## 2017-01-23 NOTE — Telephone Encounter (Signed)
New message   Pt c/o medication issue:  1. Name of Medication: isosorbide mononitrate (IMDUR) 30 MG 24 hr tablet  2. How are you currently taking this medication (dosage and times per day)? 30 mg 1 time per day  3. Are you having a reaction (difficulty breathing--STAT)? no  4. What is your medication issue? Pt states that she stopped taking this medication and was advised to let our office know as soon as possible when she stops

## 2017-01-23 NOTE — Telephone Encounter (Signed)
At last OV pt was told ok to stop Imdur after stopping Metoprolol if she is not having any CP.  Pt took last dose of Imdor on 01/15/17.  Denies CP/discomfort since stopping.  Checks vitals at gym and they have been fine.  No actual readings available.  Advised pt if any changes to certainly let us know. Pt appreciative for call. Will route to Dr. Katrinka Blazing to make him aware.

## 2017-01-23 NOTE — Telephone Encounter (Signed)
Good.

## 2017-01-27 ENCOUNTER — Ambulatory Visit (INDEPENDENT_AMBULATORY_CARE_PROVIDER_SITE_OTHER): Payer: Medicare Other | Admitting: Internal Medicine

## 2017-01-27 ENCOUNTER — Encounter: Payer: Self-pay | Admitting: Internal Medicine

## 2017-01-27 ENCOUNTER — Other Ambulatory Visit: Payer: Self-pay | Admitting: Interventional Cardiology

## 2017-01-27 VITALS — BP 122/64 | HR 74 | Temp 97.8°F | Ht 64.5 in | Wt 128.4 lb

## 2017-01-27 DIAGNOSIS — I2510 Atherosclerotic heart disease of native coronary artery without angina pectoris: Secondary | ICD-10-CM | POA: Diagnosis not present

## 2017-01-27 DIAGNOSIS — E7800 Pure hypercholesterolemia, unspecified: Secondary | ICD-10-CM | POA: Diagnosis not present

## 2017-01-27 DIAGNOSIS — I10 Essential (primary) hypertension: Secondary | ICD-10-CM

## 2017-01-27 DIAGNOSIS — Z9861 Coronary angioplasty status: Secondary | ICD-10-CM

## 2017-01-27 DIAGNOSIS — E785 Hyperlipidemia, unspecified: Secondary | ICD-10-CM

## 2017-01-27 LAB — COMPREHENSIVE METABOLIC PANEL
ALBUMIN: 4.3 g/dL (ref 3.5–5.2)
ALK PHOS: 77 U/L (ref 39–117)
ALT: 25 U/L (ref 0–35)
AST: 26 U/L (ref 0–37)
BUN: 22 mg/dL (ref 6–23)
CO2: 30 mEq/L (ref 19–32)
CREATININE: 0.98 mg/dL (ref 0.40–1.20)
Calcium: 9.4 mg/dL (ref 8.4–10.5)
Chloride: 104 mEq/L (ref 96–112)
GFR: 57.68 mL/min — ABNORMAL LOW (ref >60.00)
Glucose, Bld: 87 mg/dL (ref 70–99)
Potassium: 4.4 mEq/L (ref 3.5–5.1)
SODIUM: 139 meq/L (ref 135–145)
TOTAL PROTEIN: 6.2 g/dL (ref 6.0–8.3)
Total Bilirubin: 1.8 mg/dL — ABNORMAL HIGH (ref 0.2–1.2)

## 2017-01-27 LAB — CBC WITH DIFFERENTIAL/PLATELET
BASOS ABS: 0 10*3/uL (ref 0.0–0.1)
Basophils Relative: 0.7 % (ref 0.0–3.0)
Eosinophils Absolute: 0.1 10*3/uL (ref 0.0–0.7)
Eosinophils Relative: 2.4 % (ref 0.0–5.0)
HEMATOCRIT: 44 % (ref 36.0–46.0)
HEMOGLOBIN: 14.7 g/dL (ref 12.0–15.0)
Lymphocytes Relative: 26.8 % (ref 12.0–46.0)
Lymphs Abs: 1.1 10*3/uL (ref 0.7–4.0)
MCHC: 33.3 g/dL (ref 30.0–36.0)
MCV: 96.2 fl (ref 78.0–100.0)
MONO ABS: 0.5 10*3/uL (ref 0.1–1.0)
Monocytes Relative: 13.1 % — ABNORMAL HIGH (ref 3.0–12.0)
Neutro Abs: 2.3 10*3/uL (ref 1.4–7.7)
Neutrophils Relative %: 57 % (ref 43.0–77.0)
Platelets: 216 10*3/uL (ref 150.0–400.0)
RBC: 4.58 Mil/uL (ref 3.87–5.11)
RDW: 13.8 % (ref 11.5–15.5)
WBC: 4 10*3/uL (ref 4.0–10.5)

## 2017-01-27 LAB — TSH: TSH: 2.48 u[IU]/mL (ref 0.35–4.50)

## 2017-01-27 NOTE — Patient Instructions (Signed)
Limit your sodium (Salt) intake    It is important that you exercise regularly, at least 20 minutes 3 to 4 times per week.  If you develop chest pain or shortness of breath seek  medical attention.  Return in 6 months for follow-up  Cardiology follow-up as scheduled 

## 2017-01-27 NOTE — Progress Notes (Signed)
Subjective:    Patient ID: Nancy Neal, female    DOB: 1932/01/24, 83 y.o.   MRN: [id number]  HPI  83 year old patient who is seen today for a annual health assessment and subsequent Medicare wellness visit. Doing quite well.  She has a history of coronary artery disease and is followed by cardiology.  She remains on dual acting antiplatelet drugs as well as statin therapy.  Her cardiac status continues to do well No new concerns or complaints Metoprolol has been tapered and discontinued  Past Medical History:  Diagnosis Date  . ALLERGIC RHINITIS 06/02/2007  . Arthritis    "maybe in my right knee" (12/19/2015)  . Coronary artery disease 11/2015   a. 11/2015: Inferior STEMI w/ Promus DES to RCA.    . Diverticulosis of colon 2003   by 2003 and 2008 colonoscopy.   Marland Kitchen GERD (gastroesophageal reflux disease)   . Glaucoma 1-12   open angle  . History of blood transfusion 1971   "when I had hysterectomy"  . Myocardial infarction 11/2015  . OSTEOPENIA 06/02/2007  . VAGINITIS, ATROPHIC, POSTMENOPAUSAL 12/06/2008  . VERTIGO 02/16/2009     Social History   Social History  . Marital status: Widowed    Spouse name: N/A  . Number of children: N/A  . Years of education: N/A   Occupational History  . Not on file.   Social History Main Topics  . Smoking status: Never Smoker  . Smokeless tobacco: Never Used  . Alcohol use 4.2 oz/week    7 Glasses of wine per week  . Drug use: No  . Sexual activity: No   Other Topics Concern  . Not on file   Social History Narrative  . No narrative on file    Past Surgical History:  Procedure Laterality Date  . APPENDECTOMY  1971  . CARDIAC CATHETERIZATION N/A 12/15/2015   Procedure: Left Heart Cath and Coronary Angiography;  Surgeon: Lyn Records, MD;  Location: [facility] INVASIVE CV LAB;  Service: Cardiovascular;  Laterality: N/A;  . CARDIAC CATHETERIZATION N/A 12/15/2015   Procedure: Coronary Stent Intervention;  Surgeon: Lyn Records, MD;   Location: [facility] INVASIVE CV LAB;  Service: Cardiovascular;  Laterality: N/A;  . COLONOSCOPY N/A 12/20/2015   Procedure: COLONOSCOPY;  Surgeon: Hilarie Fredrickson, MD;  Location: [facility] ENDOSCOPY;  Service: Endoscopy;  Laterality: N/A;  . EYE SURGERY Bilateral    "laser tx for glaucoma"  . HEMORRHOID SURGERY  ~ 1968  . SHOULDER ARTHROSCOPY W/ ROTATOR CUFF REPAIR Right 11/2014  . TOTAL ABDOMINAL HYSTERECTOMY  1971    Family History  Problem Relation Age of Onset  . Suicidality Mother   . Heart disease Father   . Arthritis Brother     Allergies  Allergen Reactions  . Codeine Sulfate Nausea And Vomiting  . Meclizine Other (See Comments)    Other reaction(s): Other (See Comments) glaucoma glaucoma  . Acetaminophen Nausea Only    Pain Medicines make her sick    Current Outpatient Prescriptions on File Prior to Visit  Medication Sig Dispense Refill  . acetaminophen (TYLENOL) 325 MG tablet Take 2 tablets (650 mg total) by mouth every 4 (four) hours as needed for headache or mild pain.    Marland Kitchen aspirin 81 MG chewable tablet Chew 1 tablet (81 mg total) by mouth daily.    Marland Kitchen atorvastatin (LIPITOR) 20 MG tablet Take 1 tablet (20 mg total) by mouth daily. 90 tablet 3  . carboxymethylcellulose (REFRESH PLUS) 0.5 % SOLN Place  1 drop into both eyes 3 (three) times daily as needed (dry eyes).     . clopidogrel (PLAVIX) 75 MG tablet Take 1 tablet (75 mg total) by mouth daily. 90 tablet 3  . Multiple Vitamin (MULTIVITAMIN WITH MINERALS) TABS tablet Take 1 tablet by mouth daily.    . nitroGLYCERIN (NITROSTAT) 0.4 MG SL tablet Place 1 tablet (0.4 mg total) under the tongue every 5 (five) minutes as needed for chest pain. 25 tablet 2  . OVER THE COUNTER MEDICATION Take 1 tablet by mouth daily. Over the counter antihistamine    . VITAMIN E PO Take 1 capsule by mouth daily.      No current facility-administered medications on file prior to visit.     BP 122/64 (BP Location: Left Arm, Patient Position: Sitting, Cuff  Size: Normal)   Pulse 74   Temp 97.8 F (36.6 C) (Oral)   Ht 5' 4.5" (1.638 m)   Wt 128 lb 6.4 oz (58.2 kg)   SpO2 96%   BMI 21.70 kg/m   Medicare wellness visit  1. Risk factors, based on past  M,S,F history.  Patient has known coronary artery disease.  Cardiovascular risk factors include a history of hypertension as well as dyslipidemia  2.  Physical activities: remains quite active with a weekly exercise regimen  3.  Depression/mood:has had some situational stress but no history of major depression  4.  Hearing:moderate deficits only  5.  ADL's:independent  6.  Fall risk:low  7.  Home safety:no problems identified  8.  Height weight, and visual acuity;height and weight stable no change in visual acuity  9.  Counseling:continue heart healthy diet and regular exercise  10. Lab orders based on risk factors:laboratory update will be reviewed  11. Referral :follow cardiology as scheduled  12. Care plan:continue efforts at aggressive risk factor modification  13. Cognitive assessment: alert and oriented with normal affect.  No cognitive dysfunction  14. Screening: Patient provided with a written and personalized 5-10 year screening schedule in the AVS.    15. Provider List Update:     Review of Systems  Constitutional: Negative for appetite change, fatigue, fever and unexpected weight change.  HENT: Negative for congestion, dental problem, ear pain, hearing loss, mouth sores, nosebleeds, sinus pressure, sore throat, tinnitus, trouble swallowing and voice change.   Eyes: Negative for photophobia, pain, redness and visual disturbance.  Respiratory: Negative for cough, chest tightness and shortness of breath.   Cardiovascular: Negative for chest pain, palpitations and leg swelling.  Gastrointestinal: Negative for abdominal distention, abdominal pain, blood in stool, constipation, diarrhea, nausea, rectal pain and vomiting.  Genitourinary: Negative for difficulty  urinating, dysuria, flank pain, frequency, genital sores, hematuria, menstrual problem, pelvic pain, urgency, vaginal bleeding, vaginal discharge and vaginal pain.  Musculoskeletal: Negative for arthralgias, back pain and neck stiffness.  Skin: Negative for rash.  Neurological: Negative for dizziness, syncope, speech difficulty, weakness, light-headedness, numbness and headaches.  Hematological: Negative for adenopathy. Does not bruise/bleed easily.  Psychiatric/Behavioral: Negative for agitation, behavioral problems, dysphoric mood, self-injury and suicidal ideas. The patient is not nervous/anxious.        Objective:   Physical Exam  Constitutional: She is oriented to person, place, and time. She appears well-developed and well-nourished.  Blood pressure 120/66  HENT:  Head: Normocephalic and atraumatic.  Right Ear: External ear normal.  Left Ear: External ear normal.  Mouth/Throat: Oropharynx is clear and moist.  Eyes: Conjunctivae and EOM are normal.  Neck: Normal range of motion.  Neck supple. No JVD present. No thyromegaly present.  Cardiovascular: Normal rate, regular rhythm, normal heart sounds and intact distal pulses.   No murmur heard. Pedal pulses are full  Pulmonary/Chest: Effort normal and breath sounds normal. She has no wheezes. She has no rales.  Abdominal: Soft. Bowel sounds are normal. She exhibits no distension and no mass. There is no tenderness. There is no rebound and no guarding.  Genitourinary: Vagina normal.  Musculoskeletal: Normal range of motion. She exhibits no edema or tenderness.  Neurological: She is alert and oriented to person, place, and time. She has normal reflexes. No cranial nerve deficit. She exhibits normal muscle tone. Coordination normal.  Skin: Skin is warm and dry. No rash noted.  Psychiatric: She has a normal mood and affect. Her behavior is normal.          Assessment & Plan:   Coronary artery disease, stable Dyslipidemia.  Continue  statin therapy Subsequent Medicare wellness visit  Follow cardiology Check updated lab  Follow-up 6 months  Kristan Votta Homero Fellers

## 2017-01-27 NOTE — Progress Notes (Signed)
Pre visit review using our clinic review tool, if applicable. No additional management support is needed unless otherwise documented below in the visit note. 

## 2017-02-02 ENCOUNTER — Other Ambulatory Visit: Payer: Self-pay | Admitting: Interventional Cardiology

## 2017-04-15 ENCOUNTER — Other Ambulatory Visit: Payer: Self-pay | Admitting: Internal Medicine

## 2017-04-15 DIAGNOSIS — Z1231 Encounter for screening mammogram for malignant neoplasm of breast: Secondary | ICD-10-CM

## 2017-04-17 DIAGNOSIS — H401111 Primary open-angle glaucoma, right eye, mild stage: Secondary | ICD-10-CM | POA: Diagnosis not present

## 2017-04-17 DIAGNOSIS — H401122 Primary open-angle glaucoma, left eye, moderate stage: Secondary | ICD-10-CM | POA: Diagnosis not present

## 2017-04-29 DIAGNOSIS — H401111 Primary open-angle glaucoma, right eye, mild stage: Secondary | ICD-10-CM | POA: Diagnosis not present

## 2017-04-29 DIAGNOSIS — H401122 Primary open-angle glaucoma, left eye, moderate stage: Secondary | ICD-10-CM | POA: Diagnosis not present

## 2017-05-29 ENCOUNTER — Ambulatory Visit
Admission: RE | Admit: 2017-05-29 | Discharge: 2017-05-29 | Disposition: A | Discharge status: Home / Self Care | Payer: Medicare Other | Source: Ambulatory Visit | Attending: Internal Medicine | Admitting: Internal Medicine

## 2017-05-29 DIAGNOSIS — Z1231 Encounter for screening mammogram for malignant neoplasm of breast: Secondary | ICD-10-CM | POA: Diagnosis not present

## 2017-07-09 ENCOUNTER — Encounter: Payer: Self-pay | Admitting: Internal Medicine

## 2017-07-21 DIAGNOSIS — Z23 Encounter for immunization: Secondary | ICD-10-CM | POA: Diagnosis not present

## 2017-07-24 DIAGNOSIS — M25561 Pain in right knee: Secondary | ICD-10-CM | POA: Diagnosis not present

## 2017-11-24 ENCOUNTER — Other Ambulatory Visit: Payer: Self-pay | Admitting: Interventional Cardiology

## 2017-12-01 ENCOUNTER — Ambulatory Visit: Payer: Self-pay

## 2017-12-01 NOTE — Telephone Encounter (Signed)
Pt calling for advice for caring for head cold/ sinusitis. Pt with cold sx that started on Friday and worsened on Saturday. Pt c/o occasional mild H/A, scratchy throat, tears welling up in eyes. Denies fever. Pt given home care advice.  Reason for Disposition . [1] Sinus congestion as part of a cold AND [2] present < 10 days  Answer Assessment - Initial Assessment Questions 1. LOCATION: "Where does it hurt?"      Headache ("mild"),  2. ONSET: "When did the sinus pain start?"  (e.g., hours, days)      Friday 3. SEVERITY: "How bad is the pain?"   (Scale 1-10; mild, moderate or severe)   - MILD (1-3): doesn't interfere with normal activities    - MODERATE (4-7): interferes with normal activities (e.g., work or school) or awakens from sleep   - SEVERE (8-10): excruciating pain and patient unable to do any normal activities        mild 4. RECURRENT SYMPTOM: "Have you ever had sinus problems before?" If so, ask: "When was the last time?" and "What happened that time?"      Yes; over 10 years- saw the Dr. Melodye Ped 5. NASAL CONGESTION: "Is the nose blocked?" If so, ask, "Can you open it or must you breathe through the mouth?"     At night nose is blocked 6. NASAL DISCHARGE: "Do you have discharge from your nose?" If so ask, "What color?"     no 7. FEVER: "Do you have a fever?" If so, ask: "What is it, how was it measured, and when did it start?"      no 8. OTHER SYMPTOMS: "Do you have any other symptoms?" (e.g., sore throat, cough, earache, difficulty breathing)     Sore, scratchy throat, occasional, no earache 9. PREGNANCY: "Is there any chance you are pregnant?" "When was your last menstrual period?"     n/a  Protocols used: SINUS PAIN OR CONGESTION-A-AH

## 2017-12-09 ENCOUNTER — Telehealth: Payer: Self-pay

## 2017-12-09 NOTE — Telephone Encounter (Signed)
Patient called to report nose bleeding since 8:15 this AM.  She states the bleeding has never been "heavy," but a drip or two has fallen steadily for about 1.5 hours. It is slowing to a stop now. This has happened several times over the last few weeks. Sometimes just a drop will fall while playing Bridge, sometimes she will wake up in the middle of the night with blood on her pillow. The last bleed prior to this one was 2 weeks ago. She reports she has nasal spray to use and she does seem dry, but she isn't using the nasal spray because she doesn't think to use it until she has a bleed. Instructed her to use spray in the morning and at night to see if that helps.  She is taking both ASA and Plavix. She understands she will be called with Dr. Michaelle Copas recommendations.

## 2017-12-09 NOTE — Telephone Encounter (Signed)
She is 1 year out from MI and stent. Therefore, okay to stop Plavix.

## 2017-12-10 NOTE — Telephone Encounter (Signed)
Spoke with pt and made aware of recommendations per Dr. Katrinka Blazing.  Pt verbalized understanding and was in agreement with this plan.

## 2017-12-23 ENCOUNTER — Encounter: Payer: Self-pay | Admitting: Interventional Cardiology

## 2017-12-28 ENCOUNTER — Encounter: Payer: Self-pay | Admitting: Family Medicine

## 2017-12-28 ENCOUNTER — Ambulatory Visit (INDEPENDENT_AMBULATORY_CARE_PROVIDER_SITE_OTHER): Payer: Medicare Other | Admitting: Family Medicine

## 2017-12-28 VITALS — BP 124/74 | HR 76 | Temp 97.6°F | Ht 64.0 in | Wt 128.0 lb

## 2017-12-28 DIAGNOSIS — I2510 Atherosclerotic heart disease of native coronary artery without angina pectoris: Secondary | ICD-10-CM | POA: Diagnosis not present

## 2017-12-28 DIAGNOSIS — R1030 Lower abdominal pain, unspecified: Secondary | ICD-10-CM

## 2017-12-28 DIAGNOSIS — R8290 Unspecified abnormal findings in urine: Secondary | ICD-10-CM | POA: Diagnosis not present

## 2017-12-28 DIAGNOSIS — Z9861 Coronary angioplasty status: Secondary | ICD-10-CM

## 2017-12-28 LAB — COMPREHENSIVE METABOLIC PANEL
ALT: 15 U/L (ref 0–35)
AST: 14 U/L (ref 0–37)
Albumin: 4.3 g/dL (ref 3.5–5.2)
Alkaline Phosphatase: 72 U/L (ref 39–117)
BILIRUBIN TOTAL: 2.5 mg/dL — AB (ref 0.2–1.2)
BUN: 20 mg/dL (ref 6–23)
CO2: 32 meq/L (ref 19–32)
CREATININE: 0.91 mg/dL (ref 0.40–1.20)
Calcium: 9.9 mg/dL (ref 8.4–10.5)
Chloride: 100 mEq/L (ref 96–112)
GFR: 62.69 mL/min (ref >60.00)
GLUCOSE: 102 mg/dL — AB (ref 70–99)
Potassium: 4.5 mEq/L (ref 3.5–5.1)
SODIUM: 138 meq/L (ref 135–145)
Total Protein: 6.5 g/dL (ref 6.0–8.3)

## 2017-12-28 LAB — POC URINALSYSI DIPSTICK (AUTOMATED)
BILIRUBIN UA: NEGATIVE
Blood, UA: NEGATIVE
GLUCOSE UA: NEGATIVE
Ketones, UA: NEGATIVE
Nitrite, UA: NEGATIVE
SPEC GRAV UA: 1.02 (ref 1.010–1.025)
Urobilinogen, UA: 1 E.U./dL
pH, UA: 6 (ref 5.0–8.0)

## 2017-12-28 LAB — LIPASE: Lipase: 12 U/L (ref 11.0–59.0)

## 2017-12-28 NOTE — Addendum Note (Signed)
Addended by: Abbe Amsterdam R on: 12/28/2017 03:19 PM   Modules accepted: Orders

## 2017-12-28 NOTE — Progress Notes (Signed)
Cardiology Office Note    Date:  12/29/2017   ID:  [name], DOB November 20, 1931, MRN [id number]  PCP:  Gordy Savers, MD  Cardiologist: [name], MD   Chief Complaint  Patient presents with  . Coronary Artery Disease    History of Present Illness:  Nancy Neal is a 84 y.o. female  follow-up of CAD with known history of acute inferior MI February 2017 and documentation of residual 60-70% mid LAD, normal residual LV function, and hyperlipidemia monitored by her primary physician.   Doing well from cardiac standpoint.  Denies angina.  Has not had syncope.  No orthopnea, PND, or lower extremity swelling.   Past Medical History:  Diagnosis Date  . ALLERGIC RHINITIS 06/02/2007  . Arthritis    "maybe in my right knee" (12/19/2015)  . Coronary artery disease 11/2015   a. 11/2015: Inferior STEMI w/ Promus DES to RCA.    . Diverticulosis of colon 2003   by 2003 and 2008 colonoscopy.   Marland Kitchen GERD (gastroesophageal reflux disease)   . Glaucoma 1-12   open angle  . History of blood transfusion 1971   "when I had hysterectomy"  . Myocardial infarction (HCC) 11/2015  . OSTEOPENIA 06/02/2007  . VAGINITIS, ATROPHIC, POSTMENOPAUSAL 12/06/2008  . VERTIGO 02/16/2009    Past Surgical History:  Procedure Laterality Date  . APPENDECTOMY  1971  . CARDIAC CATHETERIZATION N/A 12/15/2015   Procedure: Left Heart Cath and Coronary Angiography;  Surgeon: Lyn Records, MD;  Location: [facility] INVASIVE CV LAB;  Service: Cardiovascular;  Laterality: N/A;  . CARDIAC CATHETERIZATION N/A 12/15/2015   Procedure: Coronary Stent Intervention;  Surgeon: Lyn Records, MD;  Location: [facility] INVASIVE CV LAB;  Service: Cardiovascular;  Laterality: N/A;  . COLONOSCOPY N/A 12/20/2015   Procedure: COLONOSCOPY;  Surgeon: Hilarie Fredrickson, MD;  Location: [facility] ENDOSCOPY;  Service: Endoscopy;  Laterality: N/A;  . EYE SURGERY Bilateral    "laser tx for glaucoma"  . HEMORRHOID SURGERY  ~ 1968  . SHOULDER ARTHROSCOPY W/ ROTATOR  CUFF REPAIR Right 11/2014  . TOTAL ABDOMINAL HYSTERECTOMY  1971    Current Medications: Outpatient Medications Prior to Visit  Medication Sig Dispense Refill  . acetaminophen (TYLENOL) 325 MG tablet Take 2 tablets (650 mg total) by mouth every 4 (four) hours as needed for headache or mild pain.    Marland Kitchen aspirin 81 MG chewable tablet Chew 1 tablet (81 mg total) by mouth daily.    Marland Kitchen atorvastatin (LIPITOR) 20 MG tablet Take 1 tablet (20 mg total) by mouth daily. 90 tablet 3  . carboxymethylcellulose (REFRESH PLUS) 0.5 % SOLN Place 1 drop into both eyes 3 (three) times daily as needed (dry eyes).     . Multiple Vitamin (MULTIVITAMIN WITH MINERALS) TABS tablet Take 1 tablet by mouth daily.    . nitroGLYCERIN (NITROSTAT) 0.4 MG SL tablet PLACE 1 TABLET UNDER THE TONGUE EVERY 5 MINUTES AS NEEDED FOR CHEST PAIN 25 tablet 3  . OVER THE COUNTER MEDICATION Take 1 tablet by mouth daily. Over the counter antihistamine    . VITAMIN E PO Take 1 capsule by mouth daily.     Marland Kitchen atorvastatin (LIPITOR) 20 MG tablet TAKE 1 TABLET(20 MG) BY MOUTH DAILY (Patient not taking: Reported on 12/29/2017) 30 tablet 0   No facility-administered medications prior to visit.      Allergies:   Codeine sulfate; Meclizine; and Acetaminophen   Social History   Socioeconomic History  . Marital status: Widowed  Spouse name: None  . Number of children: None  . Years of education: None  . Highest education level: None  Social Needs  . Financial resource strain: None  . Food insecurity - worry: None  . Food insecurity - inability: None  . Transportation needs - medical: None  . Transportation needs - non-medical: None  Occupational History  . None  Tobacco Use  . Smoking status: Never Smoker  . Smokeless tobacco: Never Used  Substance and Sexual Activity  . Alcohol use: Yes    Alcohol/week: 4.2 oz    Types: 7 Glasses of wine per week  . Drug use: No  . Sexual activity: No    Birth control/protection: Surgical  Other  Topics Concern  . None  Social History Narrative  . None     Family History:  The patient's family history includes Arthritis in her brother; Heart disease in her father; Suicidality in her mother.   ROS:   Please see the history of present illness.    Abdominal pain and diarrhea.  Saw Dr. Amador Cunas yesterday.  Workup is being done with blood testing.  She complains of having sweats that awaken her from sleep.  This is becoming increasingly frequent. All other systems reviewed and are negative.   PHYSICAL EXAM:   VS:  BP 122/84   Pulse 78   Ht 5\' 4"  (1.626 m)   Wt 127 lb 3.2 oz (57.7 kg)   BMI 21.83 kg/m    GEN: Well nourished, well developed, in no acute distress.  Slender but does not appear malnourished. HEENT: normal  Neck: no JVD, carotid bruits, or masses Cardiac: RRR; no murmurs, rubs, or gallops,no edema  Respiratory:  clear to auscultation bilaterally, normal work of breathing GI: soft, nontender, nondistended, + BS MS: no deformity or atrophy  Skin: warm and dry, no rash Neuro:  Alert and Oriented x 3, Strength and sensation are intact Psych: euthymic mood, full affect  Wt Readings from Last 3 Encounters:  12/29/17 127 lb 3.2 oz (57.7 kg)  12/28/17 128 lb (58.1 kg)  01/27/17 128 lb 6.4 oz (58.2 kg)      Studies/Labs Reviewed:   EKG:  EKG normal sinus rhythm, nonspecific ST-T abnormality, poor R wave progression V1 through V3.  Left atrial abnormality.  Recent Labs: 01/27/2017: Hemoglobin 14.7; Platelets 216.0; TSH 2.48 12/28/2017: ALT 15; BUN 20; Creatinine, Ser 0.91; Potassium 4.5; Sodium 138   Lipid Panel    Component Value Date/Time   CHOL 132 08/05/2016 0747   TRIG 74 08/05/2016 0747   HDL 58 08/05/2016 0747   CHOLHDL 2.3 08/05/2016 0747   VLDL 15 08/05/2016 0747   LDLCALC 59 08/05/2016 0747   LDLDIRECT 123.2 12/18/2011 0954    Additional studies/ records that were reviewed today include:  None    ASSESSMENT:    1. Coronary artery  disease involving native coronary artery of native heart with angina pectoris (HCC)   2. Pure hypercholesterolemia   3. CAD S/P PCI 12/15/15      PLAN:  In order of problems listed above:  1. Will check a lipid panel today.  No cardiac evaluation necessary. 2. LDL target less than 70.  Clinical follow-up in 1 year.    Medication Adjustments/Labs and Tests Ordered: Current medicines are reviewed at length with the patient today.  Concerns regarding medicines are outlined above.  Medication changes, Labs and Tests ordered today are listed in the Patient Instructions below. Patient Instructions  Medication Instructions:  Your  physician recommends that you continue on your current medications as directed. Please refer to the Current Medication list given to you today.  Labwork: Lipid today  Testing/Procedures: None  Follow-Up: Your physician wants you to follow-up in: 1 year with Dr. Katrinka Blazing.  You will receive a reminder letter in the mail two months in advance. If you don't receive a letter, please call our office to schedule the follow-up appointment.   Any Other Special Instructions Will Be Listed Below (If Applicable).     If you need a refill on your cardiac medications before your next appointment, please call your pharmacy.      Signed, [name], MD  12/29/2017 11:26 AM    [facility] [address] [place], [place], [place]  [address] Phone: [phone]; Fax: [phone]

## 2017-12-28 NOTE — Patient Instructions (Addendum)

## 2017-12-28 NOTE — Progress Notes (Signed)
Subjective:    Patient ID: Nancy Neal, female    DOB: 04/10/1932, 84 y.o.   MRN: [id number]  Chief Complaint  Patient presents with  . Follow-up    HPI Patient was seen today for acute concern.  Pt endorses sharp lower pelvic pain, headache, nausea, soreness in her rib cage, chills, feeling lethargic since yesterday.  Pt denies fever, constipation, dysuria, more urinary frequency than her normal.  Pt states she had 5 formed BMs yesterday and none today.  Pt has not taken for her symptoms.  Pt does mention she has not been drinking as much water she probably could.  Pt states this does not feel like a UTI.  Past Medical History:  Diagnosis Date  . ALLERGIC RHINITIS 06/02/2007  . Arthritis    "maybe in my right knee" (12/19/2015)  . Coronary artery disease 11/2015   a. 11/2015: Inferior STEMI w/ Promus DES to RCA.    . Diverticulosis of colon 2003   by 2003 and 2008 colonoscopy.   Marland Kitchen GERD (gastroesophageal reflux disease)   . Glaucoma 1-12   open angle  . History of blood transfusion 1971   "when I had hysterectomy"  . Myocardial infarction (HCC) 11/2015  . OSTEOPENIA 06/02/2007  . VAGINITIS, ATROPHIC, POSTMENOPAUSAL 12/06/2008  . VERTIGO 02/16/2009    Allergies  Allergen Reactions  . Codeine Sulfate Nausea And Vomiting  . Meclizine Other (See Comments)    Other reaction(s): Other (See Comments) glaucoma glaucoma  . Acetaminophen Nausea Only    Pain Medicines make her sick    ROS General: Denies fever, chills, night sweats, changes in weight, changes in appetite  +no energy, chills HEENT: Denies ear pain, changes in vision, rhinorrhea, sore throat  +HA CV: Denies CP, palpitations, SOB, orthopnea Pulm: Denies SOB, cough, wheezing GI: Denies abdominal pain, vomiting, diarrhea, constipation  +lower pelvic pain, nausea, increased stools GU: Denies dysuria, hematuria, frequency, vaginal discharge    Msk: Denies muscle cramps, joint pains  +rib cage pain Neuro: Denies weakness,  numbness, tingling Skin: Denies rashes, bruising Psych: Denies depression, anxiety, hallucinations     Objective:    Blood pressure 124/74, pulse 76, temperature 97.6 F (36.4 C), temperature source Oral, height 5\' 4"  (1.626 m), weight 128 lb (58.1 kg), SpO2 98 %.   Gen. Pleasant, well-nourished, in no distress, normal affect   HEENT: [place]/AT, face symmetric, no scleral icterus, PERRLA,  nares patent without drainage Lungs: no accessory muscle use, CTAB, no wheezes or rales Cardiovascular: RRR, no m/r/g, no peripheral edema Abdomen: BS present, soft, NT/ND, no hepatosplenomegaly.  RLQ, suprapubic, and LLQ TTP > RUQ and LUQ.  No rebound tenderness or rigidity. Neuro:  A&Ox3, CN II-XII intact, normal gait    Wt Readings from Last 3 Encounters:  12/28/17 128 lb (58.1 kg)  01/27/17 128 lb 6.4 oz (58.2 kg)  12/24/16 134 lb 9.6 oz (61.1 kg)    Lab Results  Component Value Date   WBC 4.0 01/27/2017   HGB 14.7 01/27/2017   HCT 44.0 01/27/2017   PLT 216.0 01/27/2017   GLUCOSE 87 01/27/2017   CHOL 132 08/05/2016   TRIG 74 08/05/2016   HDL 58 08/05/2016   LDLDIRECT 123.2 12/18/2011   LDLCALC 59 08/05/2016   ALT 25 01/27/2017   AST 26 01/27/2017   NA 139 01/27/2017   K 4.4 01/27/2017   CL 104 01/27/2017   CREATININE 0.98 01/27/2017   BUN 22 01/27/2017   CO2 30 01/27/2017   TSH 2.48 01/27/2017  Assessment/Plan:  Lower abdominal pain -given h/o diverticulosis concern for diverticulitis, vs UTI.  -will obtain UA, CBC, lipase, and CMP -UA with SG 1.020, negative nitrates, 1+ leuks, Protein 15, Urobilinogen 1.  Will obtain UCx -pt encouraged to increase po intake of water.   -pt given RTC or ED precautions including increased pain, N/V, fever, blood in stools, etc. -will consider CT abd pelvis with contrast for continued pain.  Abbe Amsterdam, MD

## 2017-12-29 ENCOUNTER — Encounter: Payer: Self-pay | Admitting: Interventional Cardiology

## 2017-12-29 ENCOUNTER — Telehealth: Payer: Self-pay | Admitting: Internal Medicine

## 2017-12-29 ENCOUNTER — Ambulatory Visit (INDEPENDENT_AMBULATORY_CARE_PROVIDER_SITE_OTHER): Payer: Medicare Other | Admitting: Interventional Cardiology

## 2017-12-29 VITALS — BP 122/84 | HR 78 | Ht 64.0 in | Wt 127.2 lb

## 2017-12-29 DIAGNOSIS — E7800 Pure hypercholesterolemia, unspecified: Secondary | ICD-10-CM | POA: Diagnosis not present

## 2017-12-29 DIAGNOSIS — I25119 Atherosclerotic heart disease of native coronary artery with unspecified angina pectoris: Secondary | ICD-10-CM

## 2017-12-29 DIAGNOSIS — I2510 Atherosclerotic heart disease of native coronary artery without angina pectoris: Secondary | ICD-10-CM | POA: Diagnosis not present

## 2017-12-29 DIAGNOSIS — Z9861 Coronary angioplasty status: Secondary | ICD-10-CM | POA: Diagnosis not present

## 2017-12-29 LAB — LIPID PANEL
CHOL/HDL RATIO: 2.1 ratio (ref 0.0–4.4)
Cholesterol, Total: 139 mg/dL (ref 100–199)
HDL: 66 mg/dL (ref >39)
LDL CALC: 60 mg/dL (ref 0–99)
TRIGLYCERIDES: 67 mg/dL (ref 0–149)
VLDL Cholesterol Cal: 13 mg/dL (ref 5–40)

## 2017-12-29 LAB — URINE CULTURE
MICRO NUMBER: 90308503
SPECIMEN QUALITY:: ADEQUATE

## 2017-12-29 NOTE — Telephone Encounter (Signed)
Copied from CRM [id number]. Topic: Quick Communication - Lab Results >> Dec 29, 2017  2:49 PM Skiler Olden, Tresa Endo, [place] wrote: Patient is calling to check the status of her lab work. If someone could give her a call back about this at [phone]

## 2017-12-29 NOTE — Patient Instructions (Signed)
Medication Instructions:  Your physician recommends that you continue on your current medications as directed. Please refer to the Current Medication list given to you today.  Labwork: Lipid today  Testing/Procedures: None  Follow-Up: Your physician wants you to follow-up in: 1 year with Dr. Katrinka Blazing.  You will receive a reminder letter in the mail two months in advance. If you don't receive a letter, please call our office to schedule the follow-up appointment.   Any Other Special Instructions Will Be Listed Below (If Applicable).     If you need a refill on your cardiac medications before your next appointment, please call your pharmacy.

## 2017-12-30 NOTE — Telephone Encounter (Signed)
Pt notified of results/instructions and verbalized understanding by Annice Pih, LPN.

## 2017-12-30 NOTE — Addendum Note (Signed)
Addended by: Channing Mutters on: 12/30/2017 11:25 AM   Modules accepted: Orders

## 2018-01-13 DIAGNOSIS — H401111 Primary open-angle glaucoma, right eye, mild stage: Secondary | ICD-10-CM | POA: Diagnosis not present

## 2018-01-13 DIAGNOSIS — H2513 Age-related nuclear cataract, bilateral: Secondary | ICD-10-CM | POA: Diagnosis not present

## 2018-01-13 DIAGNOSIS — H401122 Primary open-angle glaucoma, left eye, moderate stage: Secondary | ICD-10-CM | POA: Diagnosis not present

## 2018-01-17 ENCOUNTER — Other Ambulatory Visit: Payer: Self-pay | Admitting: Interventional Cardiology

## 2018-01-17 DIAGNOSIS — E785 Hyperlipidemia, unspecified: Secondary | ICD-10-CM

## 2018-01-29 ENCOUNTER — Ambulatory Visit: Payer: Medicare Other | Admitting: Internal Medicine

## 2018-02-15 ENCOUNTER — Ambulatory Visit (INDEPENDENT_AMBULATORY_CARE_PROVIDER_SITE_OTHER): Payer: Medicare Other | Admitting: Internal Medicine

## 2018-02-15 ENCOUNTER — Encounter: Payer: Self-pay | Admitting: Internal Medicine

## 2018-02-15 VITALS — BP 130/84 | HR 63 | Temp 97.8°F | Ht 64.75 in | Wt 129.0 lb

## 2018-02-15 DIAGNOSIS — Z9861 Coronary angioplasty status: Secondary | ICD-10-CM

## 2018-02-15 DIAGNOSIS — K922 Gastrointestinal hemorrhage, unspecified: Secondary | ICD-10-CM

## 2018-02-15 DIAGNOSIS — J301 Allergic rhinitis due to pollen: Secondary | ICD-10-CM

## 2018-02-15 DIAGNOSIS — E7800 Pure hypercholesterolemia, unspecified: Secondary | ICD-10-CM | POA: Diagnosis not present

## 2018-02-15 DIAGNOSIS — I25119 Atherosclerotic heart disease of native coronary artery with unspecified angina pectoris: Secondary | ICD-10-CM | POA: Diagnosis not present

## 2018-02-15 DIAGNOSIS — I2510 Atherosclerotic heart disease of native coronary artery without angina pectoris: Secondary | ICD-10-CM

## 2018-02-15 DIAGNOSIS — Z0000 Encounter for general adult medical examination without abnormal findings: Secondary | ICD-10-CM | POA: Diagnosis not present

## 2018-02-15 LAB — CBC WITH DIFFERENTIAL/PLATELET
BASOS PCT: 0.6 % (ref 0.0–3.0)
Basophils Absolute: 0 10*3/uL (ref 0.0–0.1)
EOS ABS: 0.1 10*3/uL (ref 0.0–0.7)
Eosinophils Relative: 1.9 % (ref 0.0–5.0)
HCT: 43 % (ref 36.0–46.0)
Hemoglobin: 14.3 g/dL (ref 12.0–15.0)
Lymphocytes Relative: 21.5 % (ref 12.0–46.0)
Lymphs Abs: 1.2 10*3/uL (ref 0.7–4.0)
MCHC: 33.1 g/dL (ref 30.0–36.0)
MCV: 97.3 fl (ref 78.0–100.0)
MONO ABS: 0.6 10*3/uL (ref 0.1–1.0)
Monocytes Relative: 11.1 % (ref 3.0–12.0)
NEUTROS ABS: 3.7 10*3/uL (ref 1.4–7.7)
NEUTROS PCT: 64.9 % (ref 43.0–77.0)
PLATELETS: 227 10*3/uL (ref 150.0–400.0)
RBC: 4.43 Mil/uL (ref 3.87–5.11)
RDW: 14 % (ref 11.5–15.5)
WBC: 5.7 10*3/uL (ref 4.0–10.5)

## 2018-02-15 LAB — TSH: TSH: 2.17 u[IU]/mL (ref 0.35–4.50)

## 2018-02-15 NOTE — Patient Instructions (Addendum)
Limit your sodium (Salt) intake    It is important that you exercise regularly, at least 20 minutes 3 to 4 times per week.  If you develop chest pain or shortness of breath seek  medical attention.  Cardiology follow-up as scheduled  Return in one year for follow-up  Health Maintenance for Postmenopausal Women Menopause is a normal process in which your reproductive ability comes to an end. This process happens gradually over a span of months to years, usually between the ages of 13 and 98. Menopause is complete when you have missed 12 consecutive menstrual periods. It is important to talk with your health care provider about some of the most common conditions that affect postmenopausal women, such as heart disease, cancer, and bone loss (osteoporosis). Adopting a healthy lifestyle and getting preventive care can help to promote your health and wellness. Those actions can also lower your chances of developing some of these common conditions. What should I know about menopause? During menopause, you may experience a number of symptoms, such as:  Moderate-to-severe hot flashes.  Night sweats.  Decrease in sex drive.  Mood swings.  Headaches.  Tiredness.  Irritability.  Memory problems.  Insomnia.  Choosing to treat or not to treat menopausal changes is an individual decision that you make with your health care provider. What should I know about hormone replacement therapy and supplements? Hormone therapy products are effective for treating symptoms that are associated with menopause, such as hot flashes and night sweats. Hormone replacement carries certain risks, especially as you become older. If you are thinking about using estrogen or estrogen with progestin treatments, discuss the benefits and risks with your health care provider. What should I know about heart disease and stroke? Heart disease, heart attack, and stroke become more likely as you age. This may be due, in part, to  the hormonal changes that your body experiences during menopause. These can affect how your body processes dietary fats, triglycerides, and cholesterol. Heart attack and stroke are both medical emergencies. There are many things that you can do to help prevent heart disease and stroke:  Have your blood pressure checked at least every 1-2 years. High blood pressure causes heart disease and increases the risk of stroke.  If you are 12-69 years old, ask your health care provider if you should take aspirin to prevent a heart attack or a stroke.  Do not use any tobacco products, including cigarettes, chewing tobacco, or electronic cigarettes. If you need help quitting, ask your health care provider.  It is important to eat a healthy diet and maintain a healthy weight. ? Be sure to include plenty of vegetables, fruits, low-fat dairy products, and lean protein. ? Avoid eating foods that are high in solid fats, added sugars, or salt (sodium).  Get regular exercise. This is one of the most important things that you can do for your health. ? Try to exercise for at least 150 minutes each week. The type of exercise that you do should increase your heart rate and make you sweat. This is known as moderate-intensity exercise. ? Try to do strengthening exercises at least twice each week. Do these in addition to the moderate-intensity exercise.  Know your numbers.Ask your health care provider to check your cholesterol and your blood glucose. Continue to have your blood tested as directed by your health care provider.  What should I know about cancer screening? There are several types of cancer. Take the following steps to reduce your risk and  to catch any cancer development as early as possible. Breast Cancer  Practice breast self-awareness. ? This means understanding how your breasts normally appear and feel. ? It also means doing regular breast self-exams. Let your health care provider know about any  changes, no matter how small.  If you are 41 or older, have a clinician do a breast exam (clinical breast exam or CBE) every year. Depending on your age, family history, and medical history, it may be recommended that you also have a yearly breast X-ray (mammogram).  If you have a family history of breast cancer, talk with your health care provider about genetic screening.  If you are at high risk for breast cancer, talk with your health care provider about having an MRI and a mammogram every year.  Breast cancer (BRCA) gene test is recommended for women who have family members with BRCA-related cancers. Results of the assessment will determine the need for genetic counseling and BRCA1 and for BRCA2 testing. BRCA-related cancers include these types: ? Breast. This occurs in males or females. ? Ovarian. ? Tubal. This may also be called fallopian tube cancer. ? Cancer of the abdominal or pelvic lining (peritoneal cancer). ? Prostate. ? Pancreatic.  Cervical, Uterine, and Ovarian Cancer Your health care provider may recommend that you be screened regularly for cancer of the pelvic organs. These include your ovaries, uterus, and vagina. This screening involves a pelvic exam, which includes checking for microscopic changes to the surface of your cervix (Pap test).  For women ages 21-65, health care providers may recommend a pelvic exam and a Pap test every three years. For women ages 50-65, they may recommend the Pap test and pelvic exam, combined with testing for human papilloma virus (HPV), every five years. Some types of HPV increase your risk of cervical cancer. Testing for HPV may also be done on women of any age who have unclear Pap test results.  Other health care providers may not recommend any screening for nonpregnant women who are considered low risk for pelvic cancer and have no symptoms. Ask your health care provider if a screening pelvic exam is right for you.  If you have had past  treatment for cervical cancer or a condition that could lead to cancer, you need Pap tests and screening for cancer for at least 20 years after your treatment. If Pap tests have been discontinued for you, your risk factors (such as having a new sexual partner) need to be reassessed to determine if you should start having screenings again. Some women have medical problems that increase the chance of getting cervical cancer. In these cases, your health care provider may recommend that you have screening and Pap tests more often.  If you have a family history of uterine cancer or ovarian cancer, talk with your health care provider about genetic screening.  If you have vaginal bleeding after reaching menopause, tell your health care provider.  There are currently no reliable tests available to screen for ovarian cancer.  Lung Cancer Lung cancer screening is recommended for adults 49-76 years old who are at high risk for lung cancer because of a history of smoking. A yearly low-dose CT scan of the lungs is recommended if you:  Currently smoke.  Have a history of at least 30 pack-years of smoking and you currently smoke or have quit within the past 15 years. A pack-year is smoking an average of one pack of cigarettes per day for one year.  Yearly screening should:  Continue until it has been 15 years since you quit.  Stop if you develop a health problem that would prevent you from having lung cancer treatment.  Colorectal Cancer  This type of cancer can be detected and can often be prevented.  Routine colorectal cancer screening usually begins at age 55 and continues through age 60.  If you have risk factors for colon cancer, your health care provider may recommend that you be screened at an earlier age.  If you have a family history of colorectal cancer, talk with your health care provider about genetic screening.  Your health care provider may also recommend using home test kits to check  for hidden blood in your stool.  A small camera at the end of a tube can be used to examine your colon directly (sigmoidoscopy or colonoscopy). This is done to check for the earliest forms of colorectal cancer.  Direct examination of the colon should be repeated every 5-10 years until age 67. However, if early forms of precancerous polyps or small growths are found or if you have a family history or genetic risk for colorectal cancer, you may need to be screened more often.  Skin Cancer  Check your skin from head to toe regularly.  Monitor any moles. Be sure to tell your health care provider: ? About any new moles or changes in moles, especially if there is a change in a mole's shape or color. ? If you have a mole that is larger than the size of a pencil eraser.  If any of your family members has a history of skin cancer, especially at a young age, talk with your health care provider about genetic screening.  Always use sunscreen. Apply sunscreen liberally and repeatedly throughout the day.  Whenever you are outside, protect yourself by wearing long sleeves, pants, a wide-brimmed hat, and sunglasses.  What should I know about osteoporosis? Osteoporosis is a condition in which bone destruction happens more quickly than new bone creation. After menopause, you may be at an increased risk for osteoporosis. To help prevent osteoporosis or the bone fractures that can happen because of osteoporosis, the following is recommended:  If you are 46-34 years old, get at least 1,000 mg of calcium and at least 600 mg of vitamin D per day.  If you are older than age 85 but younger than age 61, get at least 1,200 mg of calcium and at least 600 mg of vitamin D per day.  If you are older than age 13, get at least 1,200 mg of calcium and at least 800 mg of vitamin D per day.  Smoking and excessive alcohol intake increase the risk of osteoporosis. Eat foods that are rich in calcium and vitamin D, and do  weight-bearing exercises several times each week as directed by your health care provider. What should I know about how menopause affects my mental health? Depression may occur at any age, but it is more common as you become older. Common symptoms of depression include:  Low or sad mood.  Changes in sleep patterns.  Changes in appetite or eating patterns.  Feeling an overall lack of motivation or enjoyment of activities that you previously enjoyed.  Frequent crying spells.  Talk with your health care provider if you think that you are experiencing depression. What should I know about immunizations? It is important that you get and maintain your immunizations. These include:  Tetanus, diphtheria, and pertussis (Tdap) booster vaccine.  Influenza every year before the  flu season begins.  Pneumonia vaccine.  Shingles vaccine.  Your health care provider may also recommend other immunizations. This information is not intended to replace advice given to you by your health care provider. Make sure you discuss any questions you have with your health care provider. Document Released: 11/28/2005 Document Revised: 04/25/2016 Document Reviewed: 07/10/2015 Elsevier Interactive Patient Education  2018 [employer].

## 2018-02-15 NOTE — Progress Notes (Signed)
Subjective:    Patient ID: Nancy Neal, female    DOB: June 26, 1932, 84 y.o.   MRN: [id number]  HPI  84 year old patient who is seen today for annual follow-up. She has a history of coronary artery disease and has been seen recently by cardiology.  She remains on aspirin and statin therapy and clinically doing quite well. No real concerns or complaints.  She does have a history of mild allergic rhinitis which has been stable  Past Medical History:  Diagnosis Date  . ALLERGIC RHINITIS 06/02/2007  . Arthritis    "maybe in my right knee" (12/19/2015)  . Coronary artery disease 11/2015   a. 11/2015: Inferior STEMI w/ Promus DES to RCA.    . Diverticulosis of colon 2003   by 2003 and 2008 colonoscopy.   Marland Kitchen GERD (gastroesophageal reflux disease)   . Glaucoma 1-12   open angle  . History of blood transfusion 1971   "when I had hysterectomy"  . Myocardial infarction (HCC) 11/2015  . OSTEOPENIA 06/02/2007  . VAGINITIS, ATROPHIC, POSTMENOPAUSAL 12/06/2008  . VERTIGO 02/16/2009     Social History   Socioeconomic History  . Marital status: Widowed    Spouse name: Not on file  . Number of children: Not on file  . Years of education: Not on file  . Highest education level: Not on file  Occupational History  . Not on file  Social Needs  . Financial resource strain: Not on file  . Food insecurity:    Worry: Not on file    Inability: Not on file  . Transportation needs:    Medical: Not on file    Non-medical: Not on file  Tobacco Use  . Smoking status: Never Smoker  . Smokeless tobacco: Never Used  Substance and Sexual Activity  . Alcohol use: Yes    Alcohol/week: 4.2 oz    Types: 7 Glasses of wine per week  . Drug use: No  . Sexual activity: Never    Birth control/protection: Surgical  Lifestyle  . Physical activity:    Days per week: Not on file    Minutes per session: Not on file  . Stress: Not on file  Relationships  . Social connections:    Talks on phone: Not on file    Gets together: Not on file    Attends religious service: Not on file    Active member of club or organization: Not on file    Attends meetings of clubs or organizations: Not on file    Relationship status: Not on file  . Intimate partner violence:    Fear of current or ex partner: Not on file    Emotionally abused: Not on file    Physically abused: Not on file    Forced sexual activity: Not on file  Other Topics Concern  . Not on file  Social History Narrative  . Not on file    Past Surgical History:  Procedure Laterality Date  . APPENDECTOMY  1971  . CARDIAC CATHETERIZATION N/A 12/15/2015   Procedure: Left Heart Cath and Coronary Angiography;  Surgeon: Lyn Records, MD;  Location[facility] INVASIVE CV LAB;  Service: Cardiovascular;  Laterality: N/A;  . CARDIAC CATHETERIZATION N/A 12/15/2015   Procedure: Coronary Stent Intervention;  Surgeon: Lyn Records, MD;  Location: [facility] INVASIVE CV LAB;  Service: Cardiovascular;  Laterality: N/A;  . COLONOSCOPY N/A 12/20/2015   Procedure: COLONOSCOPY;  Surgeon: Hilarie Fredrickson, MD;  Location: [facility] ENDOSCOPY;  Service: Endoscopy;  Laterality: N/A;  . EYE SURGERY Bilateral    "laser tx for glaucoma"  . HEMORRHOID SURGERY  ~ 1968  . SHOULDER ARTHROSCOPY W/ ROTATOR CUFF REPAIR Right 11/2014  . TOTAL ABDOMINAL HYSTERECTOMY  1971    Family History  Problem Relation Age of Onset  . Suicidality Mother   . Heart disease Father   . Arthritis Brother   . Breast cancer Neg Hx     Allergies  Allergen Reactions  . Codeine Sulfate Nausea And Vomiting  . Meclizine Other (See Comments)    Other reaction(s): Other (See Comments) glaucoma glaucoma  . Acetaminophen Nausea Only    Pain Medicines make her sick    Current Outpatient Medications on File Prior to Visit  Medication Sig Dispense Refill  . acetaminophen (TYLENOL) 325 MG tablet Take 2 tablets (650 mg total) by mouth every 4 (four) hours as needed for headache or mild pain.    Marland Kitchen aspirin 81 MG chewable  tablet Chew 1 tablet (81 mg total) by mouth daily.    Marland Kitchen atorvastatin (LIPITOR) 20 MG tablet Take 1 tablet (20 mg total) by mouth daily. 90 tablet 3  . carboxymethylcellulose (REFRESH PLUS) 0.5 % SOLN Place 1 drop into both eyes 3 (three) times daily as needed (dry eyes).     . Multiple Vitamin (MULTIVITAMIN WITH MINERALS) TABS tablet Take 1 tablet by mouth daily.    . nitroGLYCERIN (NITROSTAT) 0.4 MG SL tablet PLACE 1 TABLET UNDER THE TONGUE EVERY 5 MINUTES AS NEEDED FOR CHEST PAIN 25 tablet 3  . OVER THE COUNTER MEDICATION Take 1 tablet by mouth daily. Over the counter antihistamine    . VITAMIN E PO Take 1 capsule by mouth daily.      No current facility-administered medications on file prior to visit.     BP 130/84 (BP Location: Right Arm, Patient Position: Sitting, Cuff Size: Normal)   Pulse 63   Temp 97.8 F (36.6 C) (Oral)   Ht 5' 4.75" (1.645 m)   Wt 129 lb (58.5 kg)   SpO2 98%   BMI 21.63 kg/m   Medicare wellness visit  1. Risk factors, based on past  M,S,F history.  Patient has known coronary artery disease.  2.  Physical activities: Remains quite active.  She goes to her health club 4 times per week and does light weight training at home at least once per week.  No activity restrictions  3.  Depression/mood:  No history of major depression or mood disorder 4.  Hearing: No deficits  5.  ADL's: Independent  6.  Fall risk: Low.  Does do some yoga but has decreased some due to some knee discomfort  7.  Home safety: No problems identified height and weight stable no change in visual acuity  8.  Height weight, and visual acuity;  9.  Counseling: Continue heart healthy diet.  Patient is very faithful about high cholesterol foods  10. Lab orders based on risk factors: Patient has had a recent lipid panel and other lab.  Will check a CBC and TSH only  11. Referral : Follow cardiology annually  12. Care plan: Continue efforts at aggressive risk factor modification  13.  Cognitive assessment: Alert and oriented with normal affect.  No cognitive dysfunction  14. Screening: Patient provided with a written and personalized 5-10 year screening schedule in the AVS.    15. Provider List Update: Primary care cardiology and ophthalmology    Review of Systems  Constitutional: Negative.   HENT:  Negative for congestion, dental problem, hearing loss, rhinorrhea, sinus pressure, sore throat and tinnitus.   Eyes: Negative for pain, discharge and visual disturbance.  Respiratory: Negative for cough and shortness of breath.   Cardiovascular: Negative for chest pain, palpitations and leg swelling.  Gastrointestinal: Positive for diarrhea. Negative for abdominal distention, abdominal pain, blood in stool, constipation, nausea and vomiting.  Genitourinary: Positive for urgency. Negative for difficulty urinating, dysuria, flank pain, frequency, hematuria, pelvic pain, vaginal bleeding, vaginal discharge and vaginal pain.  Musculoskeletal: Negative for arthralgias, gait problem and joint swelling.  Skin: Negative for rash.  Neurological: Negative for dizziness, syncope, speech difficulty, weakness, numbness and headaches.  Hematological: Negative for adenopathy.  Psychiatric/Behavioral: Negative for agitation, behavioral problems and dysphoric mood. The patient is not nervous/anxious.        Objective:   Physical Exam  Constitutional: She is oriented to person, place, and time. She appears well-developed and well-nourished.  HENT:  Head: Normocephalic.  Right Ear: External ear normal.  Left Ear: External ear normal.  Mouth/Throat: Oropharynx is clear and moist.  Eyes: Pupils are equal, round, and reactive to light. Conjunctivae and EOM are normal.  Neck: Normal range of motion. Neck supple. No thyromegaly present.  Cardiovascular: Normal rate, regular rhythm, normal heart sounds and intact distal pulses.  Pulmonary/Chest: Effort normal and breath sounds normal.    Abdominal: Soft. Bowel sounds are normal. She exhibits no mass. There is no tenderness.  Musculoskeletal: Normal range of motion.  Lymphadenopathy:    She has no cervical adenopathy.  Neurological: She is alert and oriented to person, place, and time.  Skin: Skin is warm and dry. No rash noted.  Psychiatric: She has a normal mood and affect. Her behavior is normal.          Assessment & Plan:   Coronary artery disease.  Clinically stable continue daily aspirin and statin therapy Osteopenia continue exercise program vitamin D and calcium supplements dyslipidemia continue statin therapy   Follow-up 1 year or as needed  Rogelia Boga

## 2018-04-16 ENCOUNTER — Other Ambulatory Visit: Payer: Self-pay | Admitting: Internal Medicine

## 2018-04-16 DIAGNOSIS — Z1231 Encounter for screening mammogram for malignant neoplasm of breast: Secondary | ICD-10-CM

## 2018-04-21 DIAGNOSIS — H409 Unspecified glaucoma: Secondary | ICD-10-CM | POA: Diagnosis not present

## 2018-04-21 DIAGNOSIS — H2513 Age-related nuclear cataract, bilateral: Secondary | ICD-10-CM | POA: Diagnosis not present

## 2018-04-30 ENCOUNTER — Other Ambulatory Visit: Payer: Self-pay

## 2018-04-30 MED ORDER — NITROGLYCERIN 0.4 MG SL SUBL: SUBLINGUAL_TABLET | SUBLINGUAL | 3 refills | Status: DC

## 2018-05-04 ENCOUNTER — Ambulatory Visit: Payer: Medicare Other | Admitting: Psychology

## 2018-06-02 ENCOUNTER — Ambulatory Visit
Admission: RE | Admit: 2018-06-02 | Discharge: 2018-06-02 | Disposition: A | Discharge status: Home / Self Care | Payer: Medicare Other | Source: Ambulatory Visit | Attending: Internal Medicine | Admitting: Internal Medicine

## 2018-06-02 DIAGNOSIS — Z1231 Encounter for screening mammogram for malignant neoplasm of breast: Secondary | ICD-10-CM | POA: Diagnosis not present

## 2018-06-08 DIAGNOSIS — H401190 Primary open-angle glaucoma, unspecified eye, stage unspecified: Secondary | ICD-10-CM | POA: Diagnosis not present

## 2018-06-08 DIAGNOSIS — H2513 Age-related nuclear cataract, bilateral: Secondary | ICD-10-CM | POA: Diagnosis not present

## 2018-06-10 DIAGNOSIS — H25812 Combined forms of age-related cataract, left eye: Secondary | ICD-10-CM | POA: Diagnosis not present

## 2018-06-10 DIAGNOSIS — I252 Old myocardial infarction: Secondary | ICD-10-CM | POA: Diagnosis not present

## 2018-06-10 DIAGNOSIS — I2510 Atherosclerotic heart disease of native coronary artery without angina pectoris: Secondary | ICD-10-CM | POA: Diagnosis not present

## 2018-06-10 DIAGNOSIS — Z955 Presence of coronary angioplasty implant and graft: Secondary | ICD-10-CM | POA: Diagnosis not present

## 2018-06-24 DIAGNOSIS — H25811 Combined forms of age-related cataract, right eye: Secondary | ICD-10-CM | POA: Diagnosis not present

## 2018-06-24 DIAGNOSIS — I2582 Chronic total occlusion of coronary artery: Secondary | ICD-10-CM | POA: Diagnosis not present

## 2018-06-24 DIAGNOSIS — Z955 Presence of coronary angioplasty implant and graft: Secondary | ICD-10-CM | POA: Diagnosis not present

## 2018-06-24 DIAGNOSIS — I2510 Atherosclerotic heart disease of native coronary artery without angina pectoris: Secondary | ICD-10-CM | POA: Diagnosis not present

## 2018-06-24 DIAGNOSIS — E785 Hyperlipidemia, unspecified: Secondary | ICD-10-CM | POA: Diagnosis not present

## 2018-06-24 DIAGNOSIS — K219 Gastro-esophageal reflux disease without esophagitis: Secondary | ICD-10-CM | POA: Diagnosis not present

## 2018-06-25 DIAGNOSIS — Z9842 Cataract extraction status, left eye: Secondary | ICD-10-CM | POA: Diagnosis not present

## 2018-06-25 DIAGNOSIS — Z961 Presence of intraocular lens: Secondary | ICD-10-CM | POA: Diagnosis not present

## 2018-06-25 DIAGNOSIS — H409 Unspecified glaucoma: Secondary | ICD-10-CM | POA: Diagnosis not present

## 2018-06-25 DIAGNOSIS — Z48810 Encounter for surgical aftercare following surgery on the sense organs: Secondary | ICD-10-CM | POA: Diagnosis not present

## 2018-06-25 DIAGNOSIS — H2511 Age-related nuclear cataract, right eye: Secondary | ICD-10-CM | POA: Diagnosis not present

## 2018-07-08 ENCOUNTER — Ambulatory Visit (INDEPENDENT_AMBULATORY_CARE_PROVIDER_SITE_OTHER): Payer: Medicare Other | Admitting: Family Medicine

## 2018-07-08 ENCOUNTER — Encounter: Payer: Self-pay | Admitting: Family Medicine

## 2018-07-08 ENCOUNTER — Ambulatory Visit: Payer: Medicare Other | Admitting: Family Medicine

## 2018-07-08 VITALS — BP 110/80 | HR 74 | Temp 97.5°F | Ht 65.0 in | Wt 133.0 lb

## 2018-07-08 DIAGNOSIS — I25119 Atherosclerotic heart disease of native coronary artery with unspecified angina pectoris: Secondary | ICD-10-CM | POA: Diagnosis not present

## 2018-07-08 NOTE — Assessment & Plan Note (Signed)
Denies anginal symptoms at this time.  She will continue to follow with cardiology.  Asked that she check to see if she has atorvastatin at home, recommend taking this.  F/u for annual in April.

## 2018-07-08 NOTE — Progress Notes (Signed)
Nancy Neal - 84 y.o. female MRN [id number]  Date of birth: 1932-04-07  Subjective Chief Complaint  Patient presents with  . Establish Care    HPI Nancy Neal is a 84 y.o. female with history of CAD s/p inferior STEMI with DES to RCA in 2017 and allergic rhinitis here today to establish care and meet new provider.  She continues to follow with cardiology for history of CAD.    -CAD:  Followed by cardiology. Denies any anginal symptoms including chest pain, shortness of breath, or dyspnea on exertion.  Has nitro but has not required use.  She is prescribed statin but doesn't think she is taking this.  She is taking ASA.    ROS:  A comprehensive ROS was completed and negative except as noted per HPI  Allergies  Allergen Reactions  . Codeine Sulfate Nausea And Vomiting  . Meclizine Other (See Comments)    Other reaction(s): Other (See Comments) glaucoma glaucoma  . Acetaminophen Nausea Only    Pain Medicines make her sick    Past Medical History:  Diagnosis Date  . ALLERGIC RHINITIS 06/02/2007  . Arthritis    "maybe in my right knee" (12/19/2015)  . Coronary artery disease 11/2015   a. 11/2015: Inferior STEMI w/ Promus DES to RCA.    . Diverticulosis of colon 2003   by 2003 and 2008 colonoscopy.   Marland Kitchen GERD (gastroesophageal reflux disease)   . Glaucoma 1-12   open angle  . History of blood transfusion 1971   "when I had hysterectomy"  . Myocardial infarction (HCC) 11/2015  . OSTEOPENIA 06/02/2007  . VAGINITIS, ATROPHIC, POSTMENOPAUSAL 12/06/2008  . VERTIGO 02/16/2009    Past Surgical History:  Procedure Laterality Date  . APPENDECTOMY  1971  . CARDIAC CATHETERIZATION N/A 12/15/2015   Procedure: Left Heart Cath and Coronary Angiography;  Surgeon: Lyn Records, MD;  Location: [facility] INVASIVE CV LAB;  Service: Cardiovascular;  Laterality: N/A;  . CARDIAC CATHETERIZATION N/A 12/15/2015   Procedure: Coronary Stent Intervention;  Surgeon: Lyn Records, MD;  Location: [facility] INVASIVE CV  LAB;  Service: Cardiovascular;  Laterality: N/A;  . COLONOSCOPY N/A 12/20/2015   Procedure: COLONOSCOPY;  Surgeon: Hilarie Fredrickson, MD;  Location: [facility] ENDOSCOPY;  Service: Endoscopy;  Laterality: N/A;  . EYE SURGERY Bilateral    "laser tx for glaucoma"  . HEMORRHOID SURGERY  ~ 1968  . SHOULDER ARTHROSCOPY W/ ROTATOR CUFF REPAIR Right 11/2014  . TOTAL ABDOMINAL HYSTERECTOMY  1971    Social History   Socioeconomic History  . Marital status: Widowed    Spouse name: Not on file  . Number of children: Not on file  . Years of education: Not on file  . Highest education level: Not on file  Occupational History  . Not on file  Social Needs  . Financial resource strain: Not on file  . Food insecurity:    Worry: Not on file    Inability: Not on file  . Transportation needs:    Medical: Not on file    Non-medical: Not on file  Tobacco Use  . Smoking status: Never Smoker  . Smokeless tobacco: Never Used  Substance and Sexual Activity  . Alcohol use: Yes    Alcohol/week: 7.0 standard drinks    Types: 7 Glasses of wine per week  . Drug use: No  . Sexual activity: Never    Birth control/protection: Surgical  Lifestyle  . Physical activity:    Days per week: Not on file  Minutes per session: Not on file  . Stress: Not on file  Relationships  . Social connections:    Talks on phone: Not on file    Gets together: Not on file    Attends religious service: Not on file    Active member of club or organization: Not on file    Attends meetings of clubs or organizations: Not on file    Relationship status: Not on file  Other Topics Concern  . Not on file  Social History Narrative  . Not on file    Family History  Problem Relation Age of Onset  . Suicidality Mother   . Heart disease Father   . Arthritis Brother   . Breast cancer Neg Hx     Health Maintenance  Topic Date Due  . INFLUENZA VACCINE  05/20/2018  . TETANUS/TDAP  01/05/2024  . DEXA SCAN  Completed  . PNA vac Low Risk  Adult  Completed    ----------------------------------------------------------------------------------------------------------------------------------------------------------------------------------------------------------------- Physical Exam BP 110/80 (BP Location: Left Arm, Patient Position: Sitting, Cuff Size: Normal)   Pulse 74   Temp (!) 97.5 F (36.4 C) (Oral)   Ht 5\' 5"  (1.651 m)   Wt 133 lb (60.3 kg)   SpO2 100%   BMI 22.13 kg/m   Physical Exam  Constitutional: No distress.  Eyes: No scleral icterus.  Psychiatric: She has a normal mood and affect. Her behavior is normal.   Additional exam components deferred per patient.  ------------------------------------------------------------------------------------------------------------------------------------------------------------------------------------------------------------------- Assessment and Plan  Coronary artery disease involving native coronary artery of native heart with angina pectoris (HCC) Denies anginal symptoms at this time.  She will continue to follow with cardiology.  Asked that she check to see if she has atorvastatin at home, recommend taking this.  F/u for annual in April.

## 2018-08-27 DIAGNOSIS — H401111 Primary open-angle glaucoma, right eye, mild stage: Secondary | ICD-10-CM | POA: Diagnosis not present

## 2018-08-27 DIAGNOSIS — H401122 Primary open-angle glaucoma, left eye, moderate stage: Secondary | ICD-10-CM | POA: Diagnosis not present

## 2018-09-20 DIAGNOSIS — H401122 Primary open-angle glaucoma, left eye, moderate stage: Secondary | ICD-10-CM | POA: Diagnosis not present

## 2018-09-20 DIAGNOSIS — H401111 Primary open-angle glaucoma, right eye, mild stage: Secondary | ICD-10-CM | POA: Diagnosis not present

## 2018-09-20 DIAGNOSIS — Z961 Presence of intraocular lens: Secondary | ICD-10-CM | POA: Diagnosis not present

## 2018-10-15 DIAGNOSIS — M25561 Pain in right knee: Secondary | ICD-10-CM | POA: Diagnosis not present

## 2018-11-12 DIAGNOSIS — H26492 Other secondary cataract, left eye: Secondary | ICD-10-CM | POA: Diagnosis not present

## 2018-12-31 ENCOUNTER — Encounter: Payer: Self-pay | Admitting: Interventional Cardiology

## 2018-12-31 ENCOUNTER — Other Ambulatory Visit: Payer: Self-pay

## 2018-12-31 ENCOUNTER — Ambulatory Visit (INDEPENDENT_AMBULATORY_CARE_PROVIDER_SITE_OTHER): Payer: Medicare Other | Admitting: Interventional Cardiology

## 2018-12-31 VITALS — BP 118/70 | HR 69 | Ht 65.0 in | Wt 133.6 lb

## 2018-12-31 DIAGNOSIS — E7800 Pure hypercholesterolemia, unspecified: Secondary | ICD-10-CM

## 2018-12-31 DIAGNOSIS — Z9861 Coronary angioplasty status: Secondary | ICD-10-CM | POA: Diagnosis not present

## 2018-12-31 DIAGNOSIS — I2510 Atherosclerotic heart disease of native coronary artery without angina pectoris: Secondary | ICD-10-CM

## 2018-12-31 DIAGNOSIS — I25119 Atherosclerotic heart disease of native coronary artery with unspecified angina pectoris: Secondary | ICD-10-CM | POA: Diagnosis not present

## 2018-12-31 DIAGNOSIS — H26491 Other secondary cataract, right eye: Secondary | ICD-10-CM | POA: Diagnosis not present

## 2018-12-31 LAB — BASIC METABOLIC PANEL
BUN/Creatinine Ratio: 24 (ref 12–28)
BUN: 23 mg/dL (ref 8–27)
CALCIUM: 9.3 mg/dL (ref 8.7–10.3)
CHLORIDE: 100 mmol/L (ref 96–106)
CO2: 21 mmol/L (ref 20–29)
Creatinine, Ser: 0.96 mg/dL (ref 0.57–1.00)
GFR calc non Af Amer: 54 mL/min/{1.73_m2} — ABNORMAL LOW (ref >59)
GFR, EST AFRICAN AMERICAN: 63 mL/min/{1.73_m2} (ref >59)
GLUCOSE: 76 mg/dL (ref 65–99)
POTASSIUM: 4.2 mmol/L (ref 3.5–5.2)
Sodium: 139 mmol/L (ref 134–144)

## 2018-12-31 LAB — HEPATIC FUNCTION PANEL
ALBUMIN: 4.5 g/dL (ref 3.6–4.6)
ALK PHOS: 83 IU/L (ref 39–117)
ALT: 29 IU/L (ref 0–32)
AST: 26 IU/L (ref 0–40)
Bilirubin Total: 1.2 mg/dL (ref 0.0–1.2)
Bilirubin, Direct: 0.31 mg/dL (ref 0.00–0.40)
TOTAL PROTEIN: 6.2 g/dL (ref 6.0–8.5)

## 2018-12-31 LAB — LIPID PANEL
CHOLESTEROL TOTAL: 139 mg/dL (ref 100–199)
Chol/HDL Ratio: 2.3 ratio (ref 0.0–4.4)
HDL: 60 mg/dL (ref >39)
LDL Calculated: 63 mg/dL (ref 0–99)
TRIGLYCERIDES: 78 mg/dL (ref 0–149)
VLDL Cholesterol Cal: 16 mg/dL (ref 5–40)

## 2018-12-31 LAB — CBC
Hematocrit: 42.4 % (ref 34.0–46.6)
Hemoglobin: 14.3 g/dL (ref 11.1–15.9)
MCH: 32.4 pg (ref 26.6–33.0)
MCHC: 33.7 g/dL (ref 31.5–35.7)
MCV: 96 fL (ref 79–97)
PLATELETS: 252 10*3/uL (ref 150–450)
RBC: 4.42 x10E6/uL (ref 3.77–5.28)
RDW: 12.4 % (ref 11.7–15.4)
WBC: 4.5 10*3/uL (ref 3.4–10.8)

## 2018-12-31 LAB — HEMOGLOBIN A1C
Est. average glucose Bld gHb Est-mCnc: 108 mg/dL
Hgb A1c MFr Bld: 5.4 % (ref 4.8–5.6)

## 2018-12-31 NOTE — Progress Notes (Signed)
Cardiology Office Note:    Date:  12/31/2018   ID:  Nancy Neal, DOB [birth year], MRN [id number]  PCP:  Everrett Coombe, DO  Cardiologist:  [name], MD   Referring MD: Everrett Coombe, DO   Chief Complaint  Patient presents with   Coronary Artery Disease    History of Present Illness:    Nancy Neal is a 85 y.o. female with a hx of CADwith known history of acute inferior MI February 2017 and documentation of residual 60-70% mid LAD, normal residual LV function, andhyperlipidemiamonitored by her primary physician.   Nancy Neal is doing well.  She is optimistic about her health.  She is exercising at least 160 minutes/week.  She denies exertional chest discomfort and other complaints.  She is compliant with her medical regimen.  She has had no neurological complaints.  She denies orthopnea, PND, lower extremity swelling, and dyspnea on exertion.  She also denies palpitations.  She is sleeping well.   Past Medical History:  Diagnosis Date   ALLERGIC RHINITIS 06/02/2007   Arthritis    "maybe in my right knee" (12/19/2015)   Coronary artery disease 11/2015   a. 11/2015: Inferior STEMI w/ Promus DES to RCA.     Diverticulosis of colon 2003   by 2003 and 2008 colonoscopy.    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   History of blood transfusion 1971   "when I had hysterectomy"   Myocardial infarction (HCC) 11/2015   OSTEOPENIA 06/02/2007   VAGINITIS, ATROPHIC, POSTMENOPAUSAL 12/06/2008   VERTIGO 02/16/2009    Past Surgical History:  Procedure Laterality Date   APPENDECTOMY  1971   CARDIAC CATHETERIZATION N/A 12/15/2015   Procedure: Left Heart Cath and Coronary Angiography;  Surgeon: Lyn Records, MD;  Location: [facility] INVASIVE CV LAB;  Service: Cardiovascular;  Laterality: N/A;   CARDIAC CATHETERIZATION N/A 12/15/2015   Procedure: Coronary Stent Intervention;  Surgeon: Lyn Records, MD;  Location: [facility] INVASIVE CV LAB;  Service:  Cardiovascular;  Laterality: N/A;   COLONOSCOPY N/A 12/20/2015   Procedure: COLONOSCOPY;  Surgeon: Hilarie Fredrickson, MD;  Location: [facility] ENDOSCOPY;  Service: Endoscopy;  Laterality: N/A;   EYE SURGERY Bilateral    "laser tx for glaucoma"   HEMORRHOID SURGERY  ~ 1968   SHOULDER ARTHROSCOPY W/ ROTATOR CUFF REPAIR Right 11/2014   TOTAL ABDOMINAL HYSTERECTOMY  1971    Current Medications: Current Meds  Medication Sig   acetaminophen (TYLENOL) 325 MG tablet Take 2 tablets (650 mg total) by mouth every 4 (four) hours as needed for headache or mild pain.   aspirin 81 MG chewable tablet Chew 1 tablet (81 mg total) by mouth daily.   atorvastatin (LIPITOR) 20 MG tablet Take 1 tablet (20 mg total) by mouth daily.   carboxymethylcellulose (REFRESH PLUS) 0.5 % SOLN Place 1 drop into both eyes 3 (three) times daily as needed (dry eyes).    MAGNESIUM PO Take by mouth.   Multiple Vitamin (MULTIVITAMIN WITH MINERALS) TABS tablet Take 1 tablet by mouth daily.   nitroGLYCERIN (NITROSTAT) 0.4 MG SL tablet PLACE 1 TABLET UNDER THE TONGUE EVERY 5 MINUTES AS NEEDED FOR CHEST PAIN   OVER THE COUNTER MEDICATION Take 1 tablet by mouth daily. Over the counter antihistamine   VITAMIN E PO Take 1 capsule by mouth daily.      Allergies:   Codeine sulfate; Meclizine; Acetaminophen; Morphine; and Tramadol   Social History   Socioeconomic History  Marital status: Widowed    Spouse name: Not on file   Number of children: Not on file   Years of education: Not on file   Highest education level: Not on file  Occupational History   Not on file  Social Needs   Financial resource strain: Not on file   Food insecurity:    Worry: Not on file    Inability: Not on file   Transportation needs:    Medical: Not on file    Non-medical: Not on file  Tobacco Use   Smoking status: Never Smoker   Smokeless tobacco: Never Used  Substance and Sexual Activity   Alcohol use: Yes    Alcohol/week: 7.0  standard drinks    Types: 7 Glasses of wine per week   Drug use: No   Sexual activity: Never    Birth control/protection: Surgical  Lifestyle   Physical activity:    Days per week: Not on file    Minutes per session: Not on file   Stress: Not on file  Relationships   Social connections:    Talks on phone: Not on file    Gets together: Not on file    Attends religious service: Not on file    Active member of club or organization: Not on file    Attends meetings of clubs or organizations: Not on file    Relationship status: Not on file  Other Topics Concern   Not on file  Social History Narrative   Not on file     Family History: The patient's family history includes Arthritis in her brother; Heart disease in her father; Suicidality in her mother. There is no history of Breast cancer.  ROS:   Please see the history of present illness.    Decreased hearing all other systems reviewed and are negative.  EKGs/Labs/Other Studies Reviewed:    The following studies were reviewed today:  Myocardial perfusion imaging March 2017: Study Highlights    The left ventricular ejection fraction is normal (55-65%).  Nuclear stress EF: 63%.  There was no ST segment deviation noted during stress.  No T wave inversion was noted during stress.  Defect 1: There is a small defect of mild severity.  This is a low risk study.   Small, mild fixed septal artifact. No significant reversible ischemia. LVEF 63% with normal wall motion. This is a low risk study.   CARDIAC CATH 12/2015 Diagnostic  Dominance: Right    Intervention       EKG:  EKG normal sinus rhythm with normal appearance.  Recent Labs: 02/15/2018: Hemoglobin 14.3; Platelets 227.0; TSH 2.17  Recent Lipid Panel    Component Value Date/Time   CHOL 139 12/29/2017 1134   TRIG 67 12/29/2017 1134   HDL 66 12/29/2017 1134   CHOLHDL 2.1 12/29/2017 1134   CHOLHDL 2.3 08/05/2016 0747   VLDL 15 08/05/2016 0747    LDLCALC 60 12/29/2017 1134   LDLDIRECT 123.2 12/18/2011 0954    Physical Exam:    VS:  BP 118/70    Pulse 69    Ht 5\' 5"  (1.651 m)    Wt 133 lb 9.6 oz (60.6 kg)    SpO2 95%    BMI 22.23 kg/m     Wt Readings from Last 3 Encounters:  12/31/18 133 lb 9.6 oz (60.6 kg)  07/08/18 133 lb (60.3 kg)  02/15/18 129 lb (58.5 kg)     GEN: Healthy-appearing. No acute distress HEENT: Normal NECK: No JVD. LYMPHATICS:  No lymphadenopathy CARDIAC: RRR.  No murmur, no gallop, NO edema VASCULAR: Plus bilateral radial pulses, no bruits RESPIRATORY:  Clear to auscultation without rales, wheezing or rhonchi  ABDOMEN: Soft, non-tender, non-distended, No pulsatile mass, MUSCULOSKELETAL: No deformity  SKIN: Warm and dry NEUROLOGIC:  Alert and oriented x 3 PSYCHIATRIC:  Normal affect   ASSESSMENT:    1. Coronary artery disease involving native coronary artery of native heart with angina pectoris (HCC)   2. Pure hypercholesterolemia   3. CAD S/P PCI 12/15/15    PLAN:    In order of problems listed above:  1. She is doing well from a coronary standpoint.  Residual LAD disease was tested with nuclear imaging 3 years ago and there was no evidence of ischemia.  She is getting greater than 150 minutes of moderate activity per week.  Overall, she is doing well with secondary risk prevention. 2. Last LDL was less than 70.  This was a year ago.  Blood work will be obtained today.  Overall education and awareness concerning primary/secondary risk prevention was discussed in detail: LDL less than 70, hemoglobin A1c less than 7, blood pressure target less than 130/80 mmHg, >150 minutes of moderate aerobic activity per week, avoidance of smoking, weight control (via diet and exercise), and continued surveillance/management of/for obstructive sleep apnea.  Today we will obtain a hemoglobin A1c, CBC, CMAC, and lipid panel.  She did have a bowl of oatmeal this morning. 1 year follow-up.   Medication  Adjustments/Labs and Tests Ordered: Current medicines are reviewed at length with the patient today.  Concerns regarding medicines are outlined above.  Orders Placed This Encounter  Procedures   Basic metabolic panel   Hepatic function panel   Lipid panel   HgB A1c   CBC   EKG 12-Lead   No orders of the defined types were placed in this encounter.   Patient Instructions  Medication Instructions:  .instuc  If you need a refill on your cardiac medications before your next appointment, please call your pharmacy.   Lab work: BMET, CBC, A1C, Liver and Lipid today  If you have labs (blood work) drawn today and your tests are completely normal, you will receive your results only by:  MyChart Message (if you have MyChart) OR  A paper copy in the mail If you have any lab test that is abnormal or we need to change your treatment, we will call you to review the results.  Testing/Procedures: None  Follow-Up: At [facility], you and your health needs are our priority.  As part of our continuing mission to provide you with exceptional heart care, we have created designated Provider Care Teams.  These Care Teams include your primary Cardiologist (physician) and Advanced Practice Providers (APPs -  Physician Assistants and Nurse Practitioners) who all work together to provide you with the care you need, when you need it. You will need a follow up appointment in 12 months.  Please call our office 2 months in advance to schedule this appointment.  You may see [name], MD or one of the following Advanced Practice Providers on your designated Care Team:   Norma Fredrickson, NP Nada Boozer, NP  Georgie Chard, NP  Any Other Special Instructions Will Be Listed Below (If Applicable).       Signed, [name], MD  12/31/2018 9:51 AM    [facility]

## 2018-12-31 NOTE — Patient Instructions (Signed)
Medication Instructions:  .instuc  If you need a refill on your cardiac medications before your next appointment, please call your pharmacy.   Lab work: BMET, CBC, A1C, Liver and Lipid today  If you have labs (blood work) drawn today and your tests are completely normal, you will receive your results only by: Marland Kitchen MyChart Message (if you have MyChart) OR . A paper copy in the mail If you have any lab test that is abnormal or we need to change your treatment, we will call you to review the results.  Testing/Procedures: None  Follow-Up: At [facility], you and your health needs are our priority.  As part of our continuing mission to provide you with exceptional heart care, we have created designated Provider Care Teams.  These Care Teams include your primary Cardiologist (physician) and Advanced Practice Providers (APPs -  Physician Assistants and Nurse Practitioners) who all work together to provide you with the care you need, when you need it. You will need a follow up appointment in 12 months.  Please call our office 2 months in advance to schedule this appointment.  You may see [name], MD or one of the following Advanced Practice Providers on your designated Care Team:   Norma Fredrickson, NP Nada Boozer, NP . Georgie Chard, NP  Any Other Special Instructions Will Be Listed Below (If Applicable).

## 2019-01-06 ENCOUNTER — Other Ambulatory Visit: Payer: Self-pay | Admitting: Interventional Cardiology

## 2019-01-06 DIAGNOSIS — E785 Hyperlipidemia, unspecified: Secondary | ICD-10-CM

## 2019-02-17 ENCOUNTER — Ambulatory Visit: Payer: Medicare Other | Admitting: Family Medicine

## 2019-02-23 ENCOUNTER — Encounter: Payer: Self-pay | Admitting: Family Medicine

## 2019-02-23 ENCOUNTER — Ambulatory Visit: Payer: Self-pay | Admitting: *Deleted

## 2019-03-29 NOTE — Progress Notes (Signed)
Virtual Visit via Video Note  I connected with patient on 03/30/19 at 10:00 AM EDT by audio enabled telemedicine application and verified that I am speaking with the correct person using two identifiers.   THIS ENCOUNTER IS A VIRTUAL VISIT DUE TO COVID-19 - PATIENT WAS NOT SEEN IN THE OFFICE. PATIENT HAS CONSENTED TO VIRTUAL VISIT / TELEMEDICINE VISIT   Location of patient: home  Location of provider: office  I discussed the limitations of evaluation and management by telemedicine and the availability of in person appointments. The patient expressed understanding and agreed to proceed.   Subjective:   Nancy Neal is a 85 y.o. female who presents for Medicare Annual (Subsequent) preventive examination.  Enjoys walking. Walks 3 miles daily. Plays bridge on the computer.  Review of Systems: No ROS.  Medicare Wellness Virtual Visit.  Visual/audio telehealth visit, UTA vital signs.   See social history for additional risk factors. Cardiac Risk Factors include: advanced age (>7055men, 56>65 women);dyslipidemia Sleep patterns: no issues Home Safety/Smoke Alarms: Feels safe in home. Smoke alarms in place.  [place]ives [place] story [place]. Son lives with her.   Female:         Mammo-  06/02/18     Dexa scan-   Pt declines at this time.        Objective:      Advanced Directives 03/30/2019 02/28/2016 12/20/2015 12/19/2015 12/18/2015 12/15/2015 12/15/2015  Does Patient Have a Medical Advance Directive? Yes No Yes - Yes Yes No  Type of Estate agentAdvance Directive Healthcare Power of [place];Living will - Healthcare Power of Attorney - Healthcare Power of [employer] Corporationttorney Healthcare Power of [place] -  Does patient want to make changes to medical advance directive? No - Patient declined - - No - Patient declined - No - Patient declined -  Copy of Healthcare Power of Attorney in Chart? No - copy requested - No - copy requested - No - copy requested No - copy requested -  Would patient like information on creating a medical  advance directive? - No - patient declined information - - - No - patient declined information No - patient declined information    Tobacco Social History   Tobacco Use  Smoking Status Never Smoker  Smokeless Tobacco Never Used     Counseling given: Not Answered   Clinical Intake: Pain : No/denies pain     Past Medical History:  Diagnosis Date  . ALLERGIC RHINITIS 06/02/2007  . Arthritis    "maybe in my right knee" (12/19/2015)  . Coronary artery disease 11/2015   a. 11/2015: Inferior STEMI w/ Promus DES to RCA.    . Diverticulosis of colon 2003   by 2003 and 2008 colonoscopy.   Marland Kitchen. GERD (gastroesophageal reflux disease)   . Glaucoma 1-12   open angle  . History of blood transfusion 1971   "when I had hysterectomy"  . Myocardial infarction (HCC) 11/2015  . OSTEOPENIA 06/02/2007  . VAGINITIS, ATROPHIC, POSTMENOPAUSAL 12/06/2008  . VERTIGO 02/16/2009   Past Surgical History:  Procedure Laterality Date  . APPENDECTOMY  1971  . CARDIAC CATHETERIZATION N/A 12/15/2015   Procedure: Left Heart Cath and Coronary Angiography;  Surgeon: Lyn RecordsHenry W Smith, MD;  [facility];  Service: Cardiovascular;  Laterality: N/A;  . CARDIAC CATHETERIZATION N/A 12/15/2015   Procedure: Coronary Stent Intervention;  Surgeon: Lyn RecordsHenry W Smith, MD;  [facility];  Service: Cardiovascular;  Laterality: N/A;  . COLONOSCOPY N/A 12/20/2015   Procedure: COLONOSCOPY;  Surgeon:  Irene Shipper, MD;  Location: [facility] ENDOSCOPY;  Service: Endoscopy;  Laterality: N/A;  . EYE SURGERY Bilateral    "laser tx for glaucoma"  . HEMORRHOID SURGERY  ~ 1968  . SHOULDER ARTHROSCOPY W/ ROTATOR CUFF REPAIR Right 11/2014  . TOTAL ABDOMINAL HYSTERECTOMY  1971   Family History  Problem Relation Age of Onset  . Suicidality Mother   . Heart disease Father   . Arthritis Brother   . Breast cancer Neg Hx    Social History   Socioeconomic History  . Marital status: Widowed    Spouse name: Not on file  .  Number of children: Not on file  . Years of education: Not on file  . Highest education level: Not on file  Occupational History  . Not on file  Social Needs  . Financial resource strain: Not on file  . Food insecurity:    Worry: Not on file    Inability: Not on file  . Transportation needs:    Medical: Not on file    Non-medical: Not on file  Tobacco Use  . Smoking status: Never Smoker  . Smokeless tobacco: Never Used  Substance and Sexual Activity  . Alcohol use: Yes    Alcohol/week: 7.0 standard drinks    Types: 7 Glasses of wine per week  . Drug use: No  . Sexual activity: Never    Birth control/protection: Surgical  Lifestyle  . Physical activity:    Days per week: Not on file    Minutes per session: Not on file  . Stress: Not on file  Relationships  . Social connections:    Talks on phone: Not on file    Gets together: Not on file    Attends religious service: Not on file    Active member of club or organization: Not on file    Attends meetings of clubs or organizations: Not on file    Relationship status: Not on file  Other Topics Concern  . Not on file  Social History Narrative  . Not on file    Outpatient Encounter Medications as of 03/30/2019  Medication Sig  . acetaminophen (TYLENOL) 325 MG tablet Take 2 tablets (650 mg total) by mouth every 4 (four) hours as needed for headache or mild pain.  Marland Kitchen aspirin 81 MG chewable tablet Chew 1 tablet (81 mg total) by mouth daily.  Marland Kitchen atorvastatin (LIPITOR) 20 MG tablet TAKE 1 TABLET(20 MG) BY MOUTH DAILY  . carboxymethylcellulose (REFRESH PLUS) 0.5 % SOLN Place 1 drop into both eyes 3 (three) times daily as needed (dry eyes).   Marland Kitchen MAGNESIUM PO Take by mouth.  . Multiple Vitamin (MULTIVITAMIN WITH MINERALS) TABS tablet Take 1 tablet by mouth daily.  Marland Kitchen OVER THE COUNTER MEDICATION Take 1 tablet by mouth daily. Over the counter antihistamine  . VITAMIN E PO Take 1 capsule by mouth daily.   . nitroGLYCERIN (NITROSTAT) 0.4  MG SL tablet PLACE 1 TABLET UNDER THE TONGUE EVERY 5 MINUTES AS NEEDED FOR CHEST PAIN (Patient not taking: Reported on 03/30/2019)   No facility-administered encounter medications on file as of 03/30/2019.     Activities of Daily Living In your present state of health, do you have any difficulty performing the following activities: 03/30/2019  Hearing? N  Vision? N  Difficulty concentrating or making decisions? N  Walking or climbing stairs? N  Dressing or bathing? N  Doing errands, shopping? N  Preparing Food and eating ? N  Using the Toilet? N  In the past six months, have you accidently leaked urine? N  Do you have problems with loss of bowel control? N  Managing your Medications? N  Managing your Finances? N  Housekeeping or managing your Housekeeping? N  Some recent data might be hidden    Patient Care Team: Everrett Coombe, DO as PCP - General (Family Medicine) Lyn Records, MD as PCP - Cardiology (Cardiology) Luane School, OD (Optometry)    Assessment:   This is a routine wellness examination for Kamla. Physical assessment deferred to PCP.  Exercise Activities and Dietary recommendations Current Exercise Habits: Home exercise routine, Type of exercise: walking, Time (Minutes): 40, Frequency (Times/Week): 7, Weekly Exercise (Minutes/Week): 280, Exercise limited by: None identified   Diet (meal preparation, eat out, water intake, caffeinated beverages, dairy products, fruits and vegetables): in general, a "healthy" diet  , well balanced, on average, 3 meals per day   Goals    . Maintain current health  (pt-stated)       Fall Risk Fall Risk  03/30/2019 02/15/2018 01/27/2017 01/24/2016 01/15/2016  Falls in the past year? 0 No No No No    Depression Screen PHQ 2/9 Scores 03/30/2019 02/15/2018 01/27/2017 04/30/2016  PHQ - 2 Score 0 0 0 0     Cognitive Function Ad8 score reviewed for issues:  Issues making decisions:no  Less interest in hobbies / activities:no  Repeats  questions, stories (family complaining):no  Trouble using ordinary gadgets (microwave, computer, phone):no  Forgets the month or year: no  Mismanaging finances: no  Remembering appts:no  Daily problems with thinking and/or memory:no Ad8 score is=0         Immunization History  Administered Date(s) Administered  . Influenza Split 07/16/2011, 07/12/2012  . Influenza Whole 08/03/2007, 07/20/2008, 08/05/2010, 07/15/2013  . Influenza, High Dose Seasonal PF 08/04/2016  . Influenza,inj,Quad PF,6+ Mos 08/02/2014  . Influenza-Unspecified 08/17/2015  . Pneumococcal Conjugate-13 01/04/2014  . Pneumococcal Polysaccharide-23 12/18/2011  . Tetanus 01/04/2014  . Zoster 07/25/2008  . Zoster Recombinat (Shingrix) 04/20/2017    Screening Tests Health Maintenance  Topic Date Due  . INFLUENZA VACCINE  05/21/2019  . TETANUS/TDAP  01/05/2024  . DEXA SCAN  Completed  . PNA vac Low Risk Adult  Completed      Plan:   See you next year!  Continue to eat heart healthy diet (full of fruits, vegetables, whole grains, lean protein, water--limit salt, fat, and sugar intake) and increase physical activity as tolerated.  Continue doing brain stimulating activities (puzzles, reading, adult coloring books, staying active) to keep memory sharp.     I have personally reviewed and noted the following in the patient's chart:   . Medical and social history . Use of alcohol, tobacco or illicit drugs  . Current medications and supplements . Functional ability and status . Nutritional status . Physical activity . Advanced directives . List of other physicians . Hospitalizations, surgeries, and ER visits in previous 12 months . Vitals . Screenings to include cognitive, depression, and falls . Referrals and appointments  In addition, I have reviewed and discussed with patient certain preventive protocols, quality metrics, and best practice recommendations. A written personalized care plan for  preventive services as well as general preventive health recommendations were provided to patient.     [place]on Gully, [place]  03/30/2019

## 2019-03-30 ENCOUNTER — Ambulatory Visit (INDEPENDENT_AMBULATORY_CARE_PROVIDER_SITE_OTHER): Payer: Medicare Other | Admitting: *Deleted

## 2019-03-30 ENCOUNTER — Encounter: Payer: Self-pay | Admitting: *Deleted

## 2019-03-30 ENCOUNTER — Encounter: Payer: Medicare Other | Admitting: Family Medicine

## 2019-03-30 DIAGNOSIS — Z0000 Encounter for general adult medical examination without abnormal findings: Secondary | ICD-10-CM

## 2019-03-30 NOTE — Patient Instructions (Signed)
See you next year!  Continue to eat heart healthy diet (full of fruits, vegetables, whole grains, lean protein, water--limit salt, fat, and sugar intake) and increase physical activity as tolerated.  Continue doing brain stimulating activities (puzzles, reading, adult coloring books, staying active) to keep memory sharp.    Nancy Neal , Thank you for taking time to come for your Medicare Wellness Visit. I appreciate your ongoing commitment to your health goals. Please review the following plan we discussed and let me know if I can assist you in the future.   These are the goals we discussed: Goals    . Maintain current health  (pt-stated)       This is a list of the screening recommended for you and due dates:  Health Maintenance  Topic Date Due  . Flu Shot  05/21/2019  . Tetanus Vaccine  01/05/2024  . DEXA scan (bone density measurement)  Completed  . Pneumonia vaccines  Completed    Health Maintenance After Age 11 After age 70, you are at a higher risk for certain long-term diseases and infections as well as injuries from falls. Falls are a major cause of broken bones and head injuries in people who are older than age 51. Getting regular preventive care can help to keep you healthy and well. Preventive care includes getting regular testing and making lifestyle changes as recommended by your health care provider. Talk with your health care provider about:  Which screenings and tests you should have. A screening is a test that checks for a disease when you have no symptoms.  A diet and exercise plan that is right for you. What should I know about screenings and tests to prevent falls? Screening and testing are the best ways to find a health problem early. Early diagnosis and treatment give you the best chance of managing medical conditions that are common after age 53. Certain conditions and lifestyle choices may make you more likely to have a fall. Your health care provider may  recommend:  Regular vision checks. Poor vision and conditions such as cataracts can make you more likely to have a fall. If you wear glasses, make sure to get your prescription updated if your vision changes.  Medicine review. Work with your health care provider to regularly review all of the medicines you are taking, including over-the-counter medicines. Ask your health care provider about any side effects that may make you more likely to have a fall. Tell your health care provider if any medicines that you take make you feel dizzy or sleepy.  Osteoporosis screening. Osteoporosis is a condition that causes the bones to get weaker. This can make the bones weak and cause them to break more easily.  Blood pressure screening. Blood pressure changes and medicines to control blood pressure can make you feel dizzy.  Strength and balance checks. Your health care provider may recommend certain tests to check your strength and balance while standing, walking, or changing positions.  Foot health exam. Foot pain and numbness, as well as not wearing proper footwear, can make you more likely to have a fall.  Depression screening. You may be more likely to have a fall if you have a fear of falling, feel emotionally low, or feel unable to do activities that you used to do.  Alcohol use screening. Using too much alcohol can affect your balance and may make you more likely to have a fall. What actions can I take to lower my risk of falls?  General instructions  Talk with your health care provider about your risks for falling. Tell your health care provider if: ? You fall. Be sure to tell your health care provider about all falls, even ones that seem minor. ? You feel dizzy, sleepy, or off-balance.  Take over-the-counter and prescription medicines only as told by your health care provider. These include any supplements.  Eat a healthy diet and maintain a healthy weight. A healthy diet includes low-fat dairy  products, low-fat (lean) meats, and fiber from whole grains, beans, and lots of fruits and vegetables. Home safety  Remove any tripping hazards, such as rugs, cords, and clutter.  Install safety equipment such as grab bars in bathrooms and safety rails on stairs.  Keep rooms and walkways well-lit. Activity   Follow a regular exercise program to stay fit. This will help you maintain your balance. Ask your health care provider what types of exercise are appropriate for you.  If you need a cane or walker, use it as recommended by your health care provider.  Wear supportive shoes that have nonskid soles. Lifestyle  Do not drink alcohol if your health care provider tells you not to drink.  If you drink alcohol, limit how much you have: ? 0-1 drink a day for women. ? 0-2 drinks a day for men.  Be aware of how much alcohol is in your drink. In the [place] one drink equals one typical bottle of beer (12 oz), one-half glass of wine (5 oz), or one shot of hard liquor (1 oz).  Do not use any products that contain nicotine or tobacco, such as cigarettes and e-cigarettes. If you need help quitting, ask your health care provider. Summary  Having a healthy lifestyle and getting preventive care can help to protect your health and wellness after age 13.  Screening and testing are the best way to find a health problem early and help you avoid having a fall. Early diagnosis and treatment give you the best chance for managing medical conditions that are more common for people who are older than age 20.  Falls are a major cause of broken bones and head injuries in people who are older than age 4. Take precautions to prevent a fall at home.  Work with your health care provider to learn what changes you can make to improve your health and wellness and to prevent falls. This information is not intended to replace advice given to you by your health care provider. Make sure you discuss any questions you  have with your health care provider. Document Released: 08/19/2017 Document Revised: 08/19/2017 Document Reviewed: 08/19/2017 Elsevier Interactive Patient Education  2019 [employer].

## 2019-04-08 NOTE — Progress Notes (Signed)
I have reviewed note as outlined by Gary Fleet, RN for medicare AWV and agree with findings as documented.

## 2019-04-11 ENCOUNTER — Encounter: Payer: Medicare Other | Admitting: Family Medicine

## 2019-04-19 ENCOUNTER — Other Ambulatory Visit: Payer: Self-pay | Admitting: Family Medicine

## 2019-04-19 DIAGNOSIS — Z1231 Encounter for screening mammogram for malignant neoplasm of breast: Secondary | ICD-10-CM

## 2019-04-20 DIAGNOSIS — Z961 Presence of intraocular lens: Secondary | ICD-10-CM | POA: Diagnosis not present

## 2019-04-20 DIAGNOSIS — H401111 Primary open-angle glaucoma, right eye, mild stage: Secondary | ICD-10-CM | POA: Diagnosis not present

## 2019-04-20 DIAGNOSIS — H401122 Primary open-angle glaucoma, left eye, moderate stage: Secondary | ICD-10-CM | POA: Diagnosis not present

## 2019-04-20 DIAGNOSIS — Z9841 Cataract extraction status, right eye: Secondary | ICD-10-CM | POA: Diagnosis not present

## 2019-04-20 DIAGNOSIS — Z9842 Cataract extraction status, left eye: Secondary | ICD-10-CM | POA: Diagnosis not present

## 2019-06-06 ENCOUNTER — Other Ambulatory Visit: Payer: Self-pay

## 2019-06-06 ENCOUNTER — Ambulatory Visit
Admission: RE | Admit: 2019-06-06 | Discharge: 2019-06-06 | Disposition: A | Discharge status: Home / Self Care | Payer: Medicare Other | Source: Ambulatory Visit | Attending: Family Medicine | Admitting: Family Medicine

## 2019-06-06 DIAGNOSIS — Z1231 Encounter for screening mammogram for malignant neoplasm of breast: Secondary | ICD-10-CM | POA: Diagnosis not present

## 2019-07-08 ENCOUNTER — Other Ambulatory Visit: Payer: Self-pay

## 2019-07-11 ENCOUNTER — Encounter: Payer: Medicare Other | Admitting: Family Medicine

## 2019-08-02 DIAGNOSIS — Z23 Encounter for immunization: Secondary | ICD-10-CM | POA: Diagnosis not present

## 2019-08-18 DIAGNOSIS — M7542 Impingement syndrome of left shoulder: Secondary | ICD-10-CM | POA: Diagnosis not present

## 2019-08-18 DIAGNOSIS — M25512 Pain in left shoulder: Secondary | ICD-10-CM | POA: Diagnosis not present

## 2019-08-23 ENCOUNTER — Ambulatory Visit (INDEPENDENT_AMBULATORY_CARE_PROVIDER_SITE_OTHER): Payer: Medicare Other | Admitting: Psychology

## 2019-08-23 DIAGNOSIS — F331 Major depressive disorder, recurrent, moderate: Secondary | ICD-10-CM

## 2019-08-25 ENCOUNTER — Other Ambulatory Visit: Payer: Self-pay

## 2019-08-25 ENCOUNTER — Ambulatory Visit: Payer: Medicare Other | Attending: Physician Assistant

## 2019-08-25 DIAGNOSIS — M25612 Stiffness of left shoulder, not elsewhere classified: Secondary | ICD-10-CM | POA: Diagnosis not present

## 2019-08-25 DIAGNOSIS — M25512 Pain in left shoulder: Secondary | ICD-10-CM | POA: Diagnosis not present

## 2019-08-25 DIAGNOSIS — R293 Abnormal posture: Secondary | ICD-10-CM | POA: Insufficient documentation

## 2019-08-25 DIAGNOSIS — G8929 Other chronic pain: Secondary | ICD-10-CM | POA: Diagnosis not present

## 2019-08-25 NOTE — Patient Instructions (Signed)
Access Code: [email or web address]  URL: [email or web address]  Date: 08/25/2019  Prepared by: Tomma Rakers   Exercises Seated Scapular Retraction - 10 reps - 1 sets - 3x daily - 7x weekly Seated Shoulder External Rotation - 10 reps - 1 sets - 3x daily - 7x weekly Isometric Shoulder External Rotation - 10 reps - 1 sets - 10 seconds hold - 3x daily - 7x weekly Isometric Shoulder Internal Rotation - 10 reps - 1 sets - 10 seocnds hold - 3x daily - 7x weekly

## 2019-08-26 NOTE — Therapy (Signed)
[facility] [address] [place], [place], [address] Phone: [phone]   Fax:  [phone]  Physical Therapy Evaluation  Patient Details  Name: Nancy Neal MRN: [id number] Date of Birth: [birth year] Referring Provider (PT): Jodene Nam PA   Encounter Date: 08/25/2019  PT End of Session - 08/25/19 1053    Visit Number  1    Number of Visits  13    Date for PT Re-Evaluation  09/29/19    PT Start Time  1019    PT Stop Time  1102    PT Time Calculation (min)  43 min    Activity Tolerance  Patient tolerated treatment well    Behavior During Therapy  [facility] for tasks assessed/performed       Past Medical History:  Diagnosis Date  . ALLERGIC RHINITIS 06/02/2007  . Arthritis    "maybe in my right knee" (12/19/2015)  . Coronary artery disease 11/2015   a. 11/2015: Inferior STEMI w/ Promus DES to RCA.    . Diverticulosis of colon 2003   by 2003 and 2008 colonoscopy.   Marland Kitchen GERD (gastroesophageal reflux disease)   . Glaucoma 1-12   open angle  . History of blood transfusion 1971   "when I had hysterectomy"  . Myocardial infarction (HCC) 11/2015  . OSTEOPENIA 06/02/2007  . VAGINITIS, ATROPHIC, POSTMENOPAUSAL 12/06/2008  . VERTIGO 02/16/2009    Past Surgical History:  Procedure Laterality Date  . APPENDECTOMY  1971  . CARDIAC CATHETERIZATION N/A 12/15/2015   Procedure: Left Heart Cath and Coronary Angiography;  Surgeon: Lyn Records, MD;  Location: [facility] INVASIVE CV LAB;  Service: Cardiovascular;  Laterality: N/A;  . CARDIAC CATHETERIZATION N/A 12/15/2015   Procedure: Coronary Stent Intervention;  Surgeon: Lyn Records, MD;  Location: [facility] INVASIVE CV LAB;  Service: Cardiovascular;  Laterality: N/A;  . COLONOSCOPY N/A 12/20/2015   Procedure: COLONOSCOPY;  Surgeon: Hilarie Fredrickson, MD;  Location: [facility] ENDOSCOPY;  Service: Endoscopy;  Laterality: N/A;  . EYE SURGERY Bilateral    "laser tx for glaucoma"  . HEMORRHOID SURGERY  ~ 1968  .  SHOULDER ARTHROSCOPY W/ ROTATOR CUFF REPAIR Right 11/2014  . TOTAL ABDOMINAL HYSTERECTOMY  1971    There were no vitals filed for this visit.   Subjective Assessment - 08/25/19 1025    Subjective  Pt reports that she fell at the end of August on her L arm out stretched. She states that she was hoping that it would feel better but it kept bothering her. She finally went to the doctor and was given a steriod shot which helped but seems to be wearing off. She previously had R RC surgery 3-4 years ago. She would like to avoid surgeyr on the L side. Pt reports that she has the most pain when she sleeps funny on her shouler it seems to aggravate her pain.    Limitations  Lifting    Diagnostic tests  no imaging has been done at this time.    Patient Stated Goals  My goal is to avoid rotator cuff surgery.    Currently in Pain?  Yes    Pain Score  3     Pain Location  Shoulder    Pain Orientation  Left    Pain Onset  More than a month ago    Pain Frequency  Intermittent    Aggravating Factors   abduction, horizontal abduction, putting on a jacket and when she wakes up in  the morning.    Pain Relieving Factors  rest    Effect of Pain on Daily Activities  Pt reports that she works through the pain but still does the things that she wants to do.    Multiple Pain Sites  No         [facility] PT Assessment - 08/26/19 0001      Assessment   Medical Diagnosis  L shoulder pain    Referring Provider (PT)  Jodene Nam PA    Onset Date/Surgical Date  06/20/19    Hand Dominance  Right    Next MD Visit  10/03/2019    Prior Therapy  not for this pain      Restrictions   Weight Bearing Restrictions  No      Balance Screen   Has the patient fallen in the past 6 months  Yes    How many times?  1     Has the patient had a decrease in activity level because of a fear of falling?   No    Is the patient reluctant to leave their home because of a fear of falling?   No      Home Environment   Living  Environment  Private residence    Living Arrangements  Children    Type of Home  House      Prior Function   Level of Independence  Independent    Vocation  Retired    Leisure  bridge, reading      Cognition   Overall Cognitive Status  Within Functional Limits for tasks assessed      Posture/Postural Control   Posture/Postural Control  Postural limitations    Postural Limitations  Rounded Shoulders;Forward head      AROM   AROM Assessment Site  Shoulder    Right Shoulder Flexion  176 Degrees    Right Shoulder ABduction  171 Degrees    Right Shoulder Internal Rotation  52 Degrees    Right Shoulder External Rotation  85 Degrees    Left Shoulder Flexion  167 Degrees    Left Shoulder ABduction  166 Degrees    Left Shoulder Internal Rotation  56 Degrees    Left Shoulder External Rotation  90 Degrees      Strength   Right Shoulder Flexion  4/5    Right Shoulder ABduction  4/5    Right Shoulder Internal Rotation  4/5    Right Shoulder External Rotation  4/5    Left Shoulder Flexion  4/5    Left Shoulder ABduction  4/5    Left Shoulder Internal Rotation  4/5   painful recreates symptoms   Left Shoulder External Rotation  3+/5    Right Elbow Flexion  4-/5    Right Elbow Extension  4-/5    Left Elbow Flexion  4-/5    Left Elbow Extension  4-/5    Right Wrist Flexion  5/5    Right Wrist Extension  5/5    Left Wrist Flexion  5/5    Left Wrist Extension  5/5                Objective measurements completed on examination: See above findings.              PT Education - 08/25/19 1104    Education Details  Access Code: [email or web address], Pt was educated on the anatomy of the shoulder with an emphasis on the role of the rotator cuff and how it  is important in shoulder stablization and small movements in order to decrease compenasation with larger muscle groups. Discussed the importance of performing pain-free movements in order to decrease aggravation on inflamed shoulder  muscle tissue. Pt was educated on the importance of posture in decreasing stress on the anterior shoulder and rotator cuff. Pt was educated on correct posture including shoulders back and head up imagining a "strring" from the top of the head. She was also educated on the importance of consistently challenging herselt with exercise and not limiting herself as long as she feels comfortable with exercise due to her age.    Person(s) Educated  Patient    Methods  Explanation;Demonstration;Tactile cues;Verbal cues;Handout    Comprehension  Verbalized understanding;Returned demonstration                  Plan - 08/25/19 1201    Clinical Impression Statement  Pt presents to physical therapy with decreased ROM in her L shoulder compared to her R though minimal. She is reporting pain in he L shoulder especially with horizontal Abd anti-gravity. She is demonstrating    Personal Factors and Comorbidities  Age;Behavior Pattern    Stability/Clinical Decision Making  Stable/Uncomplicated    Clinical Decision Making  Low    Rehab Potential  Good    PT Frequency  2x / week    PT Duration  6 weeks    PT Treatment/Interventions  ADLs/Self Care Home Management;Iontophoresis 4mg /ml Dexamethasone;Moist Heat;Ultrasound;Functional mobility training;Therapeutic activities;Therapeutic exercise;Balance training;Neuromuscular re-education;Patient/family education;Vasopneumatic Device;Taping    PT Next Visit Plan  Continue with isometric to easy ROM activities possibly into horizontal abd pain-free.    PT Home Exercise Plan  Access Code: [email or web address]       Patient will benefit from skilled therapeutic intervention in order to improve the following deficits and impairments:  Pain, Postural dysfunction, Decreased strength, Decreased range of motion  Visit Diagnosis: Chronic left shoulder pain  Stiffness of left shoulder, not elsewhere classified  Abnormal posture     Problem List Patient Active Problem  List   Diagnosis Date Noted  . Diverticulosis of colon without hemorrhage   . GI bleeding 12/19/2015  . CAD S/P PCI 12/15/15 12/17/2015  . Coronary artery disease involving native coronary artery of native heart with angina pectoris ([place]) 12/17/2015  . ST elevation (STEMI) myocardial infarction involving other coronary artery of inferior wall ([place]) 12/15/2015  . Hyperlipidemia 12/15/2015  . VERTIGO 02/16/2009  . VAGINITIS, ATROPHIC, POSTMENOPAUSAL 12/06/2008  . Allergic rhinitis 06/02/2007  . Disorder of bone and cartilage 06/02/2007    Ander Purpura, PT 08/26/2019, 11:12 AM  [place], [place], [address] Phone: [phone]   Fax:  [phone]  Name: Nancy Neal MRN: [id number] Date of Birth: 07-09-1932

## 2019-08-30 ENCOUNTER — Ambulatory Visit: Payer: Medicare Other | Admitting: Physical Therapy

## 2019-08-30 ENCOUNTER — Other Ambulatory Visit: Payer: Self-pay

## 2019-08-30 DIAGNOSIS — M25612 Stiffness of left shoulder, not elsewhere classified: Secondary | ICD-10-CM | POA: Diagnosis not present

## 2019-08-30 DIAGNOSIS — M25512 Pain in left shoulder: Secondary | ICD-10-CM | POA: Diagnosis not present

## 2019-08-30 DIAGNOSIS — G8929 Other chronic pain: Secondary | ICD-10-CM | POA: Diagnosis not present

## 2019-08-30 DIAGNOSIS — R293 Abnormal posture: Secondary | ICD-10-CM | POA: Diagnosis not present

## 2019-08-30 NOTE — Therapy (Signed)
[facility] [address] [place], [place], [address] Phone: [phone]   Fax:  [phone]  Physical Therapy Treatment  Patient Details  Name: Nancy Neal MRN: [id number] Date of Birth: January 19, 1932 Referring Provider (PT): Jodene Nam PA   Encounter Date: 08/30/2019  PT End of Session - 08/30/19 1138    Visit Number  2    PT Start Time  1100    PT Stop Time  1138    PT Time Calculation (min)  38 min       Past Medical History:  Diagnosis Date  . ALLERGIC RHINITIS 06/02/2007  . Arthritis    "maybe in my right knee" (12/19/2015)  . Coronary artery disease 11/2015   a. 11/2015: Inferior STEMI w/ Promus DES to RCA.    . Diverticulosis of colon 2003   by 2003 and 2008 colonoscopy.   Marland Kitchen GERD (gastroesophageal reflux disease)   . Glaucoma 1-12   open angle  . History of blood transfusion 1971   "when I had hysterectomy"  . Myocardial infarction (HCC) 11/2015  . OSTEOPENIA 06/02/2007  . VAGINITIS, ATROPHIC, POSTMENOPAUSAL 12/06/2008  . VERTIGO 02/16/2009    Past Surgical History:  Procedure Laterality Date  . APPENDECTOMY  1971  . CARDIAC CATHETERIZATION N/A 12/15/2015   Procedure: Left Heart Cath and Coronary Angiography;  Surgeon: Lyn Records, MD;  Location: [facility] INVASIVE CV LAB;  Service: Cardiovascular;  Laterality: N/A;  . CARDIAC CATHETERIZATION N/A 12/15/2015   Procedure: Coronary Stent Intervention;  Surgeon: Lyn Records, MD;  Location: [facility] INVASIVE CV LAB;  Service: Cardiovascular;  Laterality: N/A;  . COLONOSCOPY N/A 12/20/2015   Procedure: COLONOSCOPY;  Surgeon: Hilarie Fredrickson, MD;  Location: [facility] ENDOSCOPY;  Service: Endoscopy;  Laterality: N/A;  . EYE SURGERY Bilateral    "laser tx for glaucoma"  . HEMORRHOID SURGERY  ~ 1968  . SHOULDER ARTHROSCOPY W/ ROTATOR CUFF REPAIR Right 11/2014  . TOTAL ABDOMINAL HYSTERECTOMY  1971    There were no vitals filed for this visit.  Subjective Assessment - 08/30/19  1105    Subjective  "feeling pretty good, no pain"    Currently in Pain?  No/denies                       [facility] Adult PT Treatment/Exercise - 08/30/19 0001      Exercises   Exercises  --      Neck Exercises: Machines for Strengthening   UBE (Upper Arm Bike)  L1 3 min each way      Shoulder Exercises: Standing   External Rotation  Strengthening;Both;20 reps;Theraband    Theraband Level (Shoulder External Rotation)  Level 2 (Red)    Flexion  Strengthening;Both;20 reps;Weights    Shoulder Flexion Weight (lbs)  2    ABduction  Strengthening;Both;20 reps;Weights    Shoulder ABduction Weight (lbs)  1    Extension  Strengthening;Both;20 reps;Theraband    Theraband Level (Shoulder Extension)  Level 2 (Red)    Row  Strengthening;Both;20 reps;Theraband    Theraband Level (Shoulder Row)  Level 2 (Red)    Other Standing Exercises  Flexion and abduction w/ pillow case on wall, cabinet climbs x 10    Other Standing Exercises  T and W stretches x10                         Plan - 08/30/19 1139    Clinical Impression  Statement  pt needs cues with shoulder flexion and abduction to keep shoulders down. pt reports pain with T stretches, but is fine with W stretches. Session was cut [place] due to pt fatigue.    Personal Factors and Comorbidities  Age;Behavior Pattern    Stability/Clinical Decision Making  Stable/Uncomplicated    Rehab Potential  Good    PT Frequency  2x / week    PT Duration  6 weeks    PT Next Visit Plan  Continue with isometric to easy ROM activities possibly into horizontal abd pain-free.       Patient will benefit from skilled therapeutic intervention in order to improve the following deficits and impairments:  Pain, Postural dysfunction, Decreased strength, Decreased range of motion  Visit Diagnosis: Chronic left shoulder pain     Problem List Patient Active Problem List   Diagnosis Date Noted  . Diverticulosis of colon without  hemorrhage   . GI bleeding 12/19/2015  . CAD S/P PCI 12/15/15 12/17/2015  . Coronary artery disease involving native coronary artery of native heart with angina pectoris ([place]) 12/17/2015  . ST elevation (STEMI) myocardial infarction involving other coronary artery of inferior wall ([place]) 12/15/2015  . Hyperlipidemia 12/15/2015  . VERTIGO 02/16/2009  . VAGINITIS, ATROPHIC, POSTMENOPAUSAL 12/06/2008  . Allergic rhinitis 06/02/2007  . Disorder of bone and cartilage 06/02/2007    [name], SPTA 08/30/2019, 1:08 PM  [place], [place], [address] Phone: [phone]   Fax:  [phone]  Name: Nancy Neal MRN: [id number] Date of Birth: 16-Jan-1932

## 2019-09-02 ENCOUNTER — Encounter: Payer: Self-pay | Admitting: Physical Therapy

## 2019-09-02 ENCOUNTER — Other Ambulatory Visit: Payer: Self-pay

## 2019-09-02 ENCOUNTER — Ambulatory Visit: Payer: Medicare Other | Admitting: Physical Therapy

## 2019-09-02 DIAGNOSIS — M25512 Pain in left shoulder: Secondary | ICD-10-CM | POA: Diagnosis not present

## 2019-09-02 DIAGNOSIS — R293 Abnormal posture: Secondary | ICD-10-CM | POA: Diagnosis not present

## 2019-09-02 DIAGNOSIS — M25612 Stiffness of left shoulder, not elsewhere classified: Secondary | ICD-10-CM | POA: Diagnosis not present

## 2019-09-02 DIAGNOSIS — G8929 Other chronic pain: Secondary | ICD-10-CM | POA: Diagnosis not present

## 2019-09-02 NOTE — Therapy (Signed)
[place], [place], [address] Phone: [phone]   Fax:  [phone]  Physical Therapy Treatment  Patient Details  Name: Nancy Neal MRN: [id number] Date of Birth: [birth year] Referring Provider (PT): Gerrit Halls PA   Encounter Date: 09/02/2019  PT End of Session - 09/02/19 1141    Visit Number  3    Date for PT Re-Evaluation  09/29/19    PT Start Time  1105    PT Stop Time  1145    PT Time Calculation (min)  40 min    Activity Tolerance  Patient tolerated treatment well    Behavior During Therapy  [facility] for tasks assessed/performed       Past Medical History:  Diagnosis Date  . ALLERGIC RHINITIS 06/02/2007  . Arthritis    "maybe in my right knee" (12/19/2015)  . Coronary artery disease 11/2015   a. 11/2015: Inferior STEMI w/ Promus DES to RCA.    . Diverticulosis of colon 2003   by 2003 and 2008 colonoscopy.   Marland Kitchen GERD (gastroesophageal reflux disease)   . Glaucoma 1-12   open angle  . History of blood transfusion 1971   "when I had hysterectomy"  . Myocardial infarction ([place]) 11/2015  . OSTEOPENIA 06/02/2007  . VAGINITIS, ATROPHIC, POSTMENOPAUSAL 12/06/2008  . VERTIGO 02/16/2009    Past Surgical History:  Procedure Laterality Date  . APPENDECTOMY  1971  . CARDIAC CATHETERIZATION N/A 12/15/2015   Procedure: Left Heart Cath and Coronary Angiography;  Surgeon: Belva Crome, MD;  Location: [place] CV LAB;  Service: Cardiovascular;  Laterality: N/A;  . CARDIAC CATHETERIZATION N/A 12/15/2015   Procedure: Coronary Stent Intervention;  Surgeon: Belva Crome, MD;  Location: [place] CV LAB;  Service: Cardiovascular;  Laterality: N/A;  . COLONOSCOPY N/A 12/20/2015   Procedure: COLONOSCOPY;  Surgeon: Irene Shipper, MD;  Location: [place];  Service: Endoscopy;  Laterality: N/A;  . EYE SURGERY Bilateral    "laser tx for glaucoma"  . HEMORRHOID SURGERY  ~ 1968  . SHOULDER ARTHROSCOPY W/  ROTATOR CUFF REPAIR Right 11/2014  . TOTAL ABDOMINAL HYSTERECTOMY  1971    There were no vitals filed for this visit.  Subjective Assessment - 09/02/19 1111    Subjective  I think it is feeling a little better, still tight and a little sore.    Currently in Pain?  Yes    Pain Score  1     Pain Location  Shoulder    Pain Orientation  Left    Pain Descriptors / Indicators  Tightness    Aggravating Factors   putting on a coat                       [facility] Adult PT Treatment/Exercise - 09/02/19 0001      Shoulder Exercises: Standing   External Rotation  Strengthening;Both;20 reps;Theraband    Theraband Level (Shoulder External Rotation)  Level 2 (Red)    Internal Rotation  Left;20 reps;Theraband    Theraband Level (Shoulder Internal Rotation)  Level 2 (Red)    Other Standing Exercises  Flexion and abduction w/ pillow case on wall, cabinet climbs x 10      Shoulder Exercises: ROM/Strengthening   UBE (Upper Arm Bike)  level 3 x 6 minutes    Lat Pull  1.5 plate;20 reps    Wall Wash  2# flexion, CW/CCW circles at head high, and then overhead  side to side    "W" Arms  10x wiht ligfht overpressure      Shoulder Exercises: Stretch   Corner Stretch  3 reps;10 seconds    Cross Chest Stretch  3 reps;10 seconds                  PT Long Term Goals - 09/02/19 1143      PT LONG TERM GOAL #1   Title  independent with HEP    Status  Partially Met      PT LONG TERM GOAL #2   Title  independent with RICE    Status  On-going      PT LONG TERM GOAL #3   Title  increase ROM of the right shoulder to 130 degrees flexion    Status  On-going      PT LONG TERM GOAL #4   Title  dress and do hair without difficulty    Status  Partially Met            Plan - 09/02/19 1141    Clinical Impression Statement  Patient doing very well with less pain and good ROM, she does have some significant crepitus in the left shoulder with reaching, giving cues to retract scapulae  helps decrease this, she does need verbal and tactile cues for posture and to slow down    PT Next Visit Plan  continue to work on strength and function    Consulted and Agree with Plan of Care  Patient       Patient will benefit from skilled therapeutic intervention in order to improve the following deficits and impairments:  Pain, Postural dysfunction, Decreased strength, Decreased range of motion  Visit Diagnosis: Chronic left shoulder pain  Stiffness of left shoulder, not elsewhere classified  Abnormal posture     Problem List Patient Active Problem List   Diagnosis Date Noted  . Diverticulosis of colon without hemorrhage   . GI bleeding 12/19/2015  . CAD S/P PCI 12/15/15 12/17/2015  . Coronary artery disease involving native coronary artery of native heart with angina pectoris ([place]) 12/17/2015  . ST elevation (STEMI) myocardial infarction involving other coronary artery of inferior wall ([place]) 12/15/2015  . Hyperlipidemia 12/15/2015  . VERTIGO 02/16/2009  . VAGINITIS, ATROPHIC, POSTMENOPAUSAL 12/06/2008  . Allergic rhinitis 06/02/2007  . Disorder of bone and cartilage 06/02/2007    Sumner Boast., PT 09/02/2019, 11:44 AM  [place], [place], [address] Phone: [phone]   Fax:  [phone]  Name: Nancy Neal MRN: [id number] Date of Birth: [birth year]

## 2019-09-05 ENCOUNTER — Ambulatory Visit (INDEPENDENT_AMBULATORY_CARE_PROVIDER_SITE_OTHER): Payer: Medicare Other | Admitting: Psychology

## 2019-09-05 DIAGNOSIS — F331 Major depressive disorder, recurrent, moderate: Secondary | ICD-10-CM | POA: Diagnosis not present

## 2019-09-06 ENCOUNTER — Ambulatory Visit: Payer: Medicare Other | Admitting: Physical Therapy

## 2019-09-06 ENCOUNTER — Other Ambulatory Visit: Payer: Self-pay

## 2019-09-06 ENCOUNTER — Encounter: Payer: Self-pay | Admitting: Physical Therapy

## 2019-09-06 DIAGNOSIS — M25612 Stiffness of left shoulder, not elsewhere classified: Secondary | ICD-10-CM

## 2019-09-06 DIAGNOSIS — R293 Abnormal posture: Secondary | ICD-10-CM | POA: Diagnosis not present

## 2019-09-06 DIAGNOSIS — M25512 Pain in left shoulder: Secondary | ICD-10-CM | POA: Diagnosis not present

## 2019-09-06 DIAGNOSIS — G8929 Other chronic pain: Secondary | ICD-10-CM

## 2019-09-06 NOTE — Therapy (Signed)
[place], [place], [address] Phone: [phone]   Fax:  [phone]  Physical Therapy Treatment  Patient Details  Name: Nancy Neal MRN: [id number] Date of Birth: Apr 24, 1932 Referring Provider (PT): Gerrit Halls PA   Encounter Date: 09/06/2019  PT End of Session - 09/06/19 1013    Visit Number  4    Date for PT Re-Evaluation  09/29/19    PT Start Time  0931    PT Stop Time  1013    PT Time Calculation (min)  42 min    Activity Tolerance  Patient tolerated treatment well    Behavior During Therapy  [facility] for tasks assessed/performed       Past Medical History:  Diagnosis Date  . ALLERGIC RHINITIS 06/02/2007  . Arthritis    "maybe in my right knee" (12/19/2015)  . Coronary artery disease 11/2015   a. 11/2015: Inferior STEMI w/ Promus DES to RCA.    . Diverticulosis of colon 2003   by 2003 and 2008 colonoscopy.   Marland Kitchen GERD (gastroesophageal reflux disease)   . Glaucoma 1-12   open angle  . History of blood transfusion 1971   "when I had hysterectomy"  . Myocardial infarction ([place]) 11/2015  . OSTEOPENIA 06/02/2007  . VAGINITIS, ATROPHIC, POSTMENOPAUSAL 12/06/2008  . VERTIGO 02/16/2009    Past Surgical History:  Procedure Laterality Date  . APPENDECTOMY  1971  . CARDIAC CATHETERIZATION N/A 12/15/2015   Procedure: Left Heart Cath and Coronary Angiography;  Surgeon: Belva Crome, MD;  Location: [place] CV LAB;  Service: Cardiovascular;  Laterality: N/A;  . CARDIAC CATHETERIZATION N/A 12/15/2015   Procedure: Coronary Stent Intervention;  Surgeon: Belva Crome, MD;  Location: [place] CV LAB;  Service: Cardiovascular;  Laterality: N/A;  . COLONOSCOPY N/A 12/20/2015   Procedure: COLONOSCOPY;  Surgeon: Irene Shipper, MD;  Location: [place];  Service: Endoscopy;  Laterality: N/A;  . EYE SURGERY Bilateral    "laser tx for glaucoma"  . HEMORRHOID SURGERY  ~ 1968  . SHOULDER ARTHROSCOPY W/  ROTATOR CUFF REPAIR Right 11/2014  . TOTAL ABDOMINAL HYSTERECTOMY  1971    There were no vitals filed for this visit.  Subjective Assessment - 09/06/19 0940    Subjective  Patient reports that she is doing a lot better, still a little bit of trouble putting on her jacket.    Currently in Pain?  No/denies                       [facility] Adult PT Treatment/Exercise - 09/06/19 0001      Shoulder Exercises: Seated   Other Seated Exercises  5# bent over row, 2# bent over extension 2x10 each      Shoulder Exercises: Standing   External Rotation  Strengthening;Both;20 reps;Theraband    Theraband Level (Shoulder External Rotation)  Level 2 (Red)    Internal Rotation  Left;20 reps;Theraband    Theraband Level (Shoulder Internal Rotation)  Level 2 (Red)    Extension  Strengthening;Both;20 reps;Theraband    Theraband Level (Shoulder Extension)  Level 2 (Red)    Row  Strengthening;Both;20 reps;Theraband    Theraband Level (Shoulder Row)  Level 2 (Red)      Shoulder Exercises: ROM/Strengthening   UBE (Upper Arm Bike)  level 4 x 6 minutes    Lat Pull  1.5 plate;20 reps    Cybex Press Limitations  5# 2x10, this was a  little difficult    Cybex Row  1.5 plate;20 reps    Wall Wash  2# flexion, CW/CCW circles at head high, and then overhead side to side      Shoulder Exercises: Stretch   Corner Stretch  3 reps;10 seconds    Cross Chest Stretch  3 reps;10 seconds             PT Education - 09/06/19 1013    Education Details  added red tband scap retraction, row, extensoin and ER    Person(s) Educated  Patient    Methods  Explanation;Demonstration;Tactile cues;Verbal cues;Handout    Comprehension  Verbalized understanding;Returned demonstration;Verbal cues required;Tactile cues required          PT Long Term Goals - 09/06/19 1015      PT LONG TERM GOAL #3   Title  increase ROM of the right shoulder to 130 degrees flexion    Status  Achieved            Plan -  09/06/19 1014    Clinical Impression Statement  Patient again doing well with ADL's, less pain, better sleep and better dressing, still fatigues easily and also has some pain with some of the exercises, needs cues to slow down and get better posture and form.    PT Next Visit Plan  continue to work on strength and function    Consulted and Agree with Plan of Care  Patient       Patient will benefit from skilled therapeutic intervention in order to improve the following deficits and impairments:  Pain, Postural dysfunction, Decreased strength, Decreased range of motion  Visit Diagnosis: Chronic left shoulder pain  Stiffness of left shoulder, not elsewhere classified  Abnormal posture     Problem List Patient Active Problem List   Diagnosis Date Noted  . Diverticulosis of colon without hemorrhage   . GI bleeding 12/19/2015  . CAD S/P PCI 12/15/15 12/17/2015  . Coronary artery disease involving native coronary artery of native heart with angina pectoris (HCC) 12/17/2015  . ST elevation (STEMI) myocardial infarction involving other coronary artery of inferior wall (HCC) 12/15/2015  . Hyperlipidemia 12/15/2015  . VERTIGO 02/16/2009  . VAGINITIS, ATROPHIC, POSTMENOPAUSAL 12/06/2008  . Allergic rhinitis 06/02/2007  . Disorder of bone and cartilage 06/02/2007    Jearld Lesch., PT 09/06/2019, 10:15 AM  [facility] [address] [place], [place], [address] Phone: [phone]   Fax:  [phone]  Name: Nancy Neal MRN: [id number] Date of Birth: February 08, 1932

## 2019-09-08 ENCOUNTER — Other Ambulatory Visit: Payer: Self-pay

## 2019-09-08 ENCOUNTER — Ambulatory Visit: Payer: Medicare Other | Admitting: Physical Therapy

## 2019-09-08 DIAGNOSIS — R293 Abnormal posture: Secondary | ICD-10-CM | POA: Diagnosis not present

## 2019-09-08 DIAGNOSIS — M25612 Stiffness of left shoulder, not elsewhere classified: Secondary | ICD-10-CM | POA: Diagnosis not present

## 2019-09-08 DIAGNOSIS — G8929 Other chronic pain: Secondary | ICD-10-CM | POA: Diagnosis not present

## 2019-09-08 DIAGNOSIS — M25512 Pain in left shoulder: Secondary | ICD-10-CM | POA: Diagnosis not present

## 2019-09-08 NOTE — Therapy (Signed)
[facility] [address] [place], [place], [address] Phone: [phone]   Fax:  [phone]  Physical Therapy Treatment  Patient Details  Name: Nancy Neal MRN: [id number] Date of Birth: 28-Jun-1932 Referring Provider (PT): Gerrit Halls PA   Encounter Date: 09/08/2019  PT End of Session - 09/08/19 1133    Visit Number  5    Number of Visits  13    Date for PT Re-Evaluation  09/29/19    PT Start Time  1100    PT Stop Time  1138    PT Time Calculation (min)  38 min       Past Medical History:  Diagnosis Date  . ALLERGIC RHINITIS 06/02/2007  . Arthritis    "maybe in my right knee" (12/19/2015)  . Coronary artery disease 11/2015   a. 11/2015: Inferior STEMI w/ Promus DES to RCA.    . Diverticulosis of colon 2003   by 2003 and 2008 colonoscopy.   Marland Kitchen GERD (gastroesophageal reflux disease)   . Glaucoma 1-12   open angle  . History of blood transfusion 1971   "when I had hysterectomy"  . Myocardial infarction ([place]) 11/2015  . OSTEOPENIA 06/02/2007  . VAGINITIS, ATROPHIC, POSTMENOPAUSAL 12/06/2008  . VERTIGO 02/16/2009    Past Surgical History:  Procedure Laterality Date  . APPENDECTOMY  1971  . CARDIAC CATHETERIZATION N/A 12/15/2015   Procedure: Left Heart Cath and Coronary Angiography;  Surgeon: Belva Crome, MD;  Location: [facility];  Service: Cardiovascular;  Laterality: N/A;  . CARDIAC CATHETERIZATION N/A 12/15/2015   Procedure: Coronary Stent Intervention;  Surgeon: Belva Crome, MD;  Location: [facility];  Service: Cardiovascular;  Laterality: N/A;  . COLONOSCOPY N/A 12/20/2015   Procedure: COLONOSCOPY;  Surgeon: Irene Shipper, MD;  Location: [facility];  Service: Endoscopy;  Laterality: N/A;  . EYE SURGERY Bilateral    "laser tx for glaucoma"  . HEMORRHOID SURGERY  ~ 1968  . SHOULDER ARTHROSCOPY W/ ROTATOR CUFF REPAIR Right 11/2014  . TOTAL ABDOMINAL HYSTERECTOMY  1971    There were no  vitals filed for this visit.  Subjective Assessment - 09/08/19 1103    Subjective  feeling pretty good. dull ache. HEP is good- getting stronger    Currently in Pain?  Yes    Pain Score  2     Pain Location  Shoulder    Pain Orientation  Left         [facility] PT Assessment - 09/08/19 0001      AROM   Left Shoulder Flexion  178 Degrees    Left Shoulder ABduction  180 Degrees    Left Shoulder Internal Rotation  66 Degrees    Left Shoulder External Rotation  90 Degrees      Strength   Left Shoulder Flexion  4+/5    Left Shoulder ABduction  4/5    Left Shoulder Internal Rotation  4+/5    Left Shoulder External Rotation  4/5                   [facility] Adult PT Treatment/Exercise - 09/08/19 0001      Shoulder Exercises: Standing   External Rotation  Strengthening;Left;20 reps;Weights    External Rotation Weight (lbs)  3    Flexion  Strengthening;Both;10 reps;Weights    Shoulder Flexion Weight (lbs)  2    ABduction  Strengthening;Both;10 reps;Weights    Shoulder ABduction Weight (lbs)  2  Extension  Both;Strengthening;10 reps   5# pulley   Row  Strengthening;Both;10 reps   5# pulleys   Other Standing Exercises  5# cable pulley 2 sets 10   wall angles 5 x, tried with wt but unable d/t weak   Other Standing Exercises  2# 4 pt rhy stab 10 reps 4 spots      Shoulder Exercises: ROM/Strengthening   UBE (Upper Arm Bike)  level 4 x 6 minutes ( 3 min each way)    Lat Pull  20 reps    Lat Pull Limitations  20#    Cybex Row  20 reps    Cybex Row Limitations  20#    Other ROM/Strengthening Exercises  3# cane ex ext and IR 15      Manual Therapy   Manual therapy comments  PROM with emphasis on IR and jt capsule stretches all directions                  PT Long Term Goals - 09/08/19 1134      PT LONG TERM GOAL #1   Title  independent with HEP    Status  Partially Met      PT LONG TERM GOAL #2   Title  independent with RICE    Status  Achieved      PT  LONG TERM GOAL #3   Title  increase ROM of the right shoulder to 130 degrees flexion    Status  Achieved      PT LONG TERM GOAL #4   Title  dress and do hair without difficulty    Status  Partially Met            Plan - 09/08/19 1135    Clinical Impression Statement  STG met and progressing with LTGs. ROM is coming along very well, IR is most limited but improving. Weakness and fatigue are biggest challenge and needs postural cuing    PT Treatment/Interventions  ADLs/Self Care Home Management;Iontophoresis 40m/ml Dexamethasone;Moist Heat;Ultrasound;Functional mobility training;Therapeutic activities;Therapeutic exercise;Balance training;Neuromuscular re-education;Patient/family education;Vasopneumatic Device;Taping    PT Next Visit Plan  continue to work on strength and function       Patient will benefit from skilled therapeutic intervention in order to improve the following deficits and impairments:  Pain, Postural dysfunction, Decreased strength, Decreased range of motion  Visit Diagnosis: Stiffness of left shoulder, not elsewhere classified  Abnormal posture  Chronic left shoulder pain     Problem List Patient Active Problem List   Diagnosis Date Noted  . Diverticulosis of colon without hemorrhage   . GI bleeding 12/19/2015  . CAD S/P PCI 12/15/15 12/17/2015  . Coronary artery disease involving native coronary artery of native heart with angina pectoris ([place] 12/17/2015  . ST elevation (STEMI) myocardial infarction involving other coronary artery of inferior wall ([place] 12/15/2015  . Hyperlipidemia 12/15/2015  . VERTIGO 02/16/2009  . VAGINITIS, ATROPHIC, POSTMENOPAUSAL 12/06/2008  . Allergic rhinitis 06/02/2007  . Disorder of bone and cartilage 06/02/2007    PAYSEUR,ANGIE PTA 09/08/2019, 11:37 AM  [place] [address]Phone: [phone]  Fax:  [phone] Name:  Nancy QADIRMRN: [id number] of Birth: [birth year]

## 2019-09-12 ENCOUNTER — Ambulatory Visit (INDEPENDENT_AMBULATORY_CARE_PROVIDER_SITE_OTHER): Payer: Medicare Other | Admitting: Psychology

## 2019-09-12 DIAGNOSIS — F330 Major depressive disorder, recurrent, mild: Secondary | ICD-10-CM

## 2019-09-13 ENCOUNTER — Encounter: Payer: Self-pay | Admitting: Physical Therapy

## 2019-09-13 ENCOUNTER — Ambulatory Visit: Payer: Medicare Other | Admitting: Physical Therapy

## 2019-09-13 ENCOUNTER — Other Ambulatory Visit: Payer: Self-pay

## 2019-09-13 DIAGNOSIS — M25612 Stiffness of left shoulder, not elsewhere classified: Secondary | ICD-10-CM

## 2019-09-13 DIAGNOSIS — G8929 Other chronic pain: Secondary | ICD-10-CM | POA: Diagnosis not present

## 2019-09-13 DIAGNOSIS — R293 Abnormal posture: Secondary | ICD-10-CM

## 2019-09-13 DIAGNOSIS — M25512 Pain in left shoulder: Secondary | ICD-10-CM

## 2019-09-13 NOTE — Therapy (Signed)
[facility] [place], [place], [address] Phone: [phone]   Fax:  [phone]  Physical Therapy Treatment  Patient Details  Name: Nancy Neal MRN: [id number] Date of Birth: May 13, 1932 Referring Provider (PT): Gerrit Halls PA   Encounter Date: 09/13/2019  PT End of Session - 09/13/19 1135    Visit Number  6    Date for PT Re-Evaluation  09/29/19    PT Start Time  1059    PT Stop Time  1140    PT Time Calculation (min)  41 min    Activity Tolerance  Patient tolerated treatment well    Behavior During Therapy  [facility] for tasks assessed/performed       Past Medical History:  Diagnosis Date  . ALLERGIC RHINITIS 06/02/2007  . Arthritis    "maybe in my right knee" (12/19/2015)  . Coronary artery disease 11/2015   a. 11/2015: Inferior STEMI w/ Promus DES to RCA.    . Diverticulosis of colon 2003   by 2003 and 2008 colonoscopy.   Marland Kitchen GERD (gastroesophageal reflux disease)   . Glaucoma 1-12   open angle  . History of blood transfusion 1971   "when I had hysterectomy"  . Myocardial infarction ([place]) 11/2015  . OSTEOPENIA 06/02/2007  . VAGINITIS, ATROPHIC, POSTMENOPAUSAL 12/06/2008  . VERTIGO 02/16/2009    Past Surgical History:  Procedure Laterality Date  . APPENDECTOMY  1971  . CARDIAC CATHETERIZATION N/A 12/15/2015   Procedure: Left Heart Cath and Coronary Angiography;  Surgeon: Belva Crome, MD;  Location: [facility];  Service: Cardiovascular;  Laterality: N/A;  . CARDIAC CATHETERIZATION N/A 12/15/2015   Procedure: Coronary Stent Intervention;  Surgeon: Belva Crome, MD;  Location: [facility];  Service: Cardiovascular;  Laterality: N/A;  . COLONOSCOPY N/A 12/20/2015   Procedure: COLONOSCOPY;  Surgeon: Irene Shipper, MD;  Location: [facility];  Service: Endoscopy;  Laterality: N/A;  . EYE SURGERY Bilateral    "laser tx for glaucoma"  . HEMORRHOID SURGERY  ~ 1968  . SHOULDER ARTHROSCOPY W/  ROTATOR CUFF REPAIR Right 11/2014  . TOTAL ABDOMINAL HYSTERECTOMY  1971    There were no vitals filed for this visit.  Subjective Assessment - 09/13/19 1109    Subjective  Doing well, pain with putting on the jacket    Currently in Pain?  Yes    Pain Score  2     Pain Location  Shoulder    Pain Orientation  Left    Aggravating Factors   putting on jacket                       [facility] Adult PT Treatment/Exercise - 09/13/19 0001      Shoulder Exercises: Standing   External Rotation  Strengthening;Left;20 reps;Weights    Theraband Level (Shoulder External Rotation)  Level 2 (Red)    Other Standing Exercises  red tband horizontal abduction    Other Standing Exercises  2# 4 pt rhy stab 10 reps 4 spots, weighted ball pass around the waist both directions      Shoulder Exercises: ROM/Strengthening   UBE (Upper Arm Bike)  level 4 x 6 minutes ( 3 min each way)    Lat Pull  20 reps    Lat Pull Limitations  20#    Cybex Row  20 reps    Cybex Row Limitations  20#    Wall Wash  2# flexion,  CW/CCW circles at head high, and then overhead side to side      Shoulder Exercises: IT sales professional  3 reps;10 seconds    Cross Chest Stretch  3 reps;10 seconds    Internal Rotation Stretch  10 seconds    Star Gazer Stretch  3 reps;10 seconds    Other Shoulder Stretches  sleeper stretch      Manual Therapy   Manual Therapy  Passive ROM    Passive ROM  to end range all left GH motions                  PT Long Term Goals - 09/13/19 1151      PT LONG TERM GOAL #1   Title  independent with HEP    Status  Partially Met      PT LONG TERM GOAL #2   Title  independent with RICE    Status  Achieved      PT LONG TERM GOAL #3   Title  increase ROM of the right shoulder to 130 degrees flexion    Status  Achieved      PT LONG TERM GOAL #4   Title  dress and do hair without difficulty    Status  Achieved            Plan - 09/13/19 1150    Clinical  Impression Statement  Patient is doing great she reports only issue is putting on her jacket at times, she has tightness and some pain with IR and with doorway stretch.  She reports able to dress and do hair without difficulty , some tightnes sin the upper trap noted    PT Next Visit Plan  work on the ROM see how the new HEP did    Consulted and Agree with Plan of Care  Patient       Patient will benefit from skilled therapeutic intervention in order to improve the following deficits and impairments:  Pain, Postural dysfunction, Decreased strength, Decreased range of motion  Visit Diagnosis: Stiffness of left shoulder, not elsewhere classified  Chronic left shoulder pain  Abnormal posture     Problem List Patient Active Problem List   Diagnosis Date Noted  . Diverticulosis of colon without hemorrhage   . GI bleeding 12/19/2015  . CAD S/P PCI 12/15/15 12/17/2015  . Coronary artery disease involving native coronary artery of native heart with angina pectoris ([place]) 12/17/2015  . ST elevation (STEMI) myocardial infarction involving other coronary artery of inferior wall ([place]) 12/15/2015  . Hyperlipidemia 12/15/2015  . VERTIGO 02/16/2009  . VAGINITIS, ATROPHIC, POSTMENOPAUSAL 12/06/2008  . Allergic rhinitis 06/02/2007  . Disorder of bone and cartilage 06/02/2007    Sumner Boast., PT 09/13/2019, 11:52 AM  [facility] [address] [place], [place], [address] Phone: [phone]   Fax:  [phone]  Name: ROSIBEL GIACOBBE MRN: [id number] Date of Birth: [birth year]

## 2019-09-13 NOTE — Patient Instructions (Signed)
Access Code: [email or web address]  URL: [email or web address]  Date: 09/13/2019  Prepared by: Lum Babe   Exercises Sleeper Stretch - 5 reps - 1 sets - 10 hold - 2x daily - 7x weekly Doorway Pec Stretch at 90 Degrees Abduction - 5 reps - 1 sets - 10 hold - 2x daily - 7x weekly Standing Shoulder Internal Rotation AAROM with Dowel - 5 reps - 1 sets - 10 hold - 2x daily - 7x weekly

## 2019-09-19 ENCOUNTER — Other Ambulatory Visit: Payer: Self-pay

## 2019-09-19 ENCOUNTER — Ambulatory Visit: Payer: Medicare Other | Admitting: Physical Therapy

## 2019-09-19 ENCOUNTER — Encounter: Payer: Self-pay | Admitting: Physical Therapy

## 2019-09-19 DIAGNOSIS — R293 Abnormal posture: Secondary | ICD-10-CM | POA: Diagnosis not present

## 2019-09-19 DIAGNOSIS — M25512 Pain in left shoulder: Secondary | ICD-10-CM | POA: Diagnosis not present

## 2019-09-19 DIAGNOSIS — G8929 Other chronic pain: Secondary | ICD-10-CM | POA: Diagnosis not present

## 2019-09-19 DIAGNOSIS — M25612 Stiffness of left shoulder, not elsewhere classified: Secondary | ICD-10-CM | POA: Diagnosis not present

## 2019-09-19 NOTE — Therapy (Signed)
[facility] [place], [place], [address] Phone: [phone]   Fax:  [phone]  Physical Therapy Treatment  Patient Details  Name: Nancy Neal MRN: [id number] Date of Birth: 08-28-1932 Referring Provider (PT): Gerrit Halls PA   Encounter Date: 09/19/2019  PT End of Session - 09/19/19 1151    Visit Number  7    Date for PT Re-Evaluation  09/29/19    PT Start Time  3536    PT Stop Time  1055    PT Time Calculation (min)  40 min    Activity Tolerance  Patient tolerated treatment well    Behavior During Therapy  [facility] for tasks assessed/performed       Past Medical History:  Diagnosis Date  . ALLERGIC RHINITIS 06/02/2007  . Arthritis    "maybe in my right knee" (12/19/2015)  . Coronary artery disease 11/2015   a. 11/2015: Inferior STEMI w/ Promus DES to RCA.    . Diverticulosis of colon 2003   by 2003 and 2008 colonoscopy.   Marland Kitchen GERD (gastroesophageal reflux disease)   . Glaucoma 1-12   open angle  . History of blood transfusion 1971   "when I had hysterectomy"  . Myocardial infarction ([place]) 11/2015  . OSTEOPENIA 06/02/2007  . VAGINITIS, ATROPHIC, POSTMENOPAUSAL 12/06/2008  . VERTIGO 02/16/2009    Past Surgical History:  Procedure Laterality Date  . APPENDECTOMY  1971  . CARDIAC CATHETERIZATION N/A 12/15/2015   Procedure: Left Heart Cath and Coronary Angiography;  Surgeon: Belva Crome, MD;  Location: [facility];  Service: Cardiovascular;  Laterality: N/A;  . CARDIAC CATHETERIZATION N/A 12/15/2015   Procedure: Coronary Stent Intervention;  Surgeon: Belva Crome, MD;  Location: [facility];  Service: Cardiovascular;  Laterality: N/A;  . COLONOSCOPY N/A 12/20/2015   Procedure: COLONOSCOPY;  Surgeon: Irene Shipper, MD;  Location: [facility];  Service: Endoscopy;  Laterality: N/A;  . EYE SURGERY Bilateral    "laser tx for glaucoma"  . HEMORRHOID SURGERY  ~ 1968  . SHOULDER ARTHROSCOPY W/  ROTATOR CUFF REPAIR Right 11/2014  . TOTAL ABDOMINAL HYSTERECTOMY  1971    There were no vitals filed for this visit.  Subjective Assessment - 09/19/19 1019    Subjective  Patient reports that she is doing well.    Currently in Pain?  Yes    Pain Score  1     Pain Location  Shoulder    Pain Orientation  Left    Pain Descriptors / Indicators  Sore;Tightness    Pain Relieving Factors  stretches                       [facility] Adult PT Treatment/Exercise - 09/19/19 0001      Shoulder Exercises: Standing   External Rotation  Strengthening;Left;20 reps;Weights    Theraband Level (Shoulder External Rotation)  Level 2 (Red)    Other Standing Exercises  red tband horizontal abduction, back to wall overhead lift    Other Standing Exercises  isometric red tband abduction      Shoulder Exercises: ROM/Strengthening   Nustep  level 5 x 5 minutes    Lat Pull  20 reps    Lat Pull Limitations  20#    Cybex Press  1 plate;20 reps    Cybex Row  20 reps    Cybex Row Limitations  20#    Wall Wash  2# flexion, CW/CCW  circles at head high, and then overhead side to side      Manual Therapy   Manual Therapy  Passive ROM    Passive ROM  to end range all left [place] motions             PT Education - 09/19/19 1150    Education Details  reviewed HEP she was doing the sleeper stretch wrong, we corrected this    Person(s) Educated  Patient    Methods  Explanation;Demonstration;Tactile cues;Verbal cues;Handout    Comprehension  Verbalized understanding;Returned demonstration;Verbal cues required          PT Long Term Goals - 09/13/19 1151      PT LONG TERM GOAL #1   Title  independent with HEP    Status  Partially Met      PT LONG TERM GOAL #2   Title  independent with RICE    Status  Achieved      PT LONG TERM GOAL #3   Title  increase ROM of the right shoulder to 130 degrees flexion    Status  Achieved      PT LONG TERM GOAL #4   Title  dress and do hair without  difficulty    Status  Achieved            Plan - 09/19/19 1151    Clinical Impression Statement  Patient was doing the sleeper stretch wrong, we corrected this, she has some tenderness and knots in the left posterior lateral upper arm/shoulder.  She did fatigue quickly today    PT Next Visit Plan  work on the ROM see how the new HEP did    Consulted and Agree with Plan of Care  Patient       Patient will benefit from skilled therapeutic intervention in order to improve the following deficits and impairments:  Pain, Postural dysfunction, Decreased strength, Decreased range of motion  Visit Diagnosis: Stiffness of left shoulder, not elsewhere classified  Chronic left shoulder pain  Abnormal posture     Problem List Patient Active Problem List   Diagnosis Date Noted  . Diverticulosis of colon without hemorrhage   . GI bleeding 12/19/2015  . CAD S/P PCI 12/15/15 12/17/2015  . Coronary artery disease involving native coronary artery of native heart with angina pectoris ([place]) 12/17/2015  . ST elevation (STEMI) myocardial infarction involving other coronary artery of inferior wall ([place]) 12/15/2015  . Hyperlipidemia 12/15/2015  . VERTIGO 02/16/2009  . VAGINITIS, ATROPHIC, POSTMENOPAUSAL 12/06/2008  . Allergic rhinitis 06/02/2007  . Disorder of bone and cartilage 06/02/2007    Sumner Boast., PT 09/19/2019, 11:53 AM  [address] [place], [place], [address] Phone: [phone]   Fax:  [phone]  Name: JUAQUINA MACHNIK MRN: [id number] Date of Birth: October 21, 1931

## 2019-09-20 ENCOUNTER — Ambulatory Visit (INDEPENDENT_AMBULATORY_CARE_PROVIDER_SITE_OTHER): Payer: Medicare Other | Admitting: Psychology

## 2019-09-20 ENCOUNTER — Other Ambulatory Visit: Payer: Self-pay | Admitting: Interventional Cardiology

## 2019-09-20 DIAGNOSIS — F331 Major depressive disorder, recurrent, moderate: Secondary | ICD-10-CM | POA: Diagnosis not present

## 2019-09-20 MED ORDER — NITROGLYCERIN 0.4 MG SL SUBL: SUBLINGUAL_TABLET | SUBLINGUAL | 3 refills | Status: DC

## 2019-09-20 NOTE — Telephone Encounter (Signed)
Pt's medication was sent to pt's pharmacy as requested. Confirmation received.  °

## 2019-09-22 ENCOUNTER — Ambulatory Visit: Payer: Medicare Other | Admitting: Physical Therapy

## 2019-09-26 ENCOUNTER — Other Ambulatory Visit: Payer: Self-pay

## 2019-09-26 ENCOUNTER — Ambulatory Visit: Payer: Medicare Other | Attending: Physician Assistant | Admitting: Physical Therapy

## 2019-09-26 ENCOUNTER — Encounter: Payer: Self-pay | Admitting: Physical Therapy

## 2019-09-26 DIAGNOSIS — G8929 Other chronic pain: Secondary | ICD-10-CM | POA: Insufficient documentation

## 2019-09-26 DIAGNOSIS — R293 Abnormal posture: Secondary | ICD-10-CM | POA: Diagnosis not present

## 2019-09-26 DIAGNOSIS — M25612 Stiffness of left shoulder, not elsewhere classified: Secondary | ICD-10-CM | POA: Diagnosis not present

## 2019-09-26 DIAGNOSIS — M25512 Pain in left shoulder: Secondary | ICD-10-CM | POA: Diagnosis not present

## 2019-09-26 NOTE — Therapy (Signed)
[facility] [place], [place], [address] Phone: [phone]   Fax:  [phone]  Physical Therapy Treatment  Patient Details  Name: Nancy Neal MRN: [id number] Date of Birth: 10-26-1931 Referring Provider (PT): Gerrit Halls PA   Encounter Date: 09/26/2019  PT End of Session - 09/26/19 1051    Visit Number  8    Date for PT Re-Evaluation  09/29/19    PT Start Time  1014    PT Stop Time  1053    PT Time Calculation (min)  39 min    Activity Tolerance  Patient tolerated treatment well    Behavior During Therapy  [facility] for tasks assessed/performed       Past Medical History:  Diagnosis Date  . ALLERGIC RHINITIS 06/02/2007  . Arthritis    "maybe in my right knee" (12/19/2015)  . Coronary artery disease 11/2015   a. 11/2015: Inferior STEMI w/ Promus DES to RCA.    . Diverticulosis of colon 2003   by 2003 and 2008 colonoscopy.   Marland Kitchen GERD (gastroesophageal reflux disease)   . Glaucoma 1-12   open angle  . History of blood transfusion 1971   "when I had hysterectomy"  . Myocardial infarction ([place]) 11/2015  . OSTEOPENIA 06/02/2007  . VAGINITIS, ATROPHIC, POSTMENOPAUSAL 12/06/2008  . VERTIGO 02/16/2009    Past Surgical History:  Procedure Laterality Date  . APPENDECTOMY  1971  . CARDIAC CATHETERIZATION N/A 12/15/2015   Procedure: Left Heart Cath and Coronary Angiography;  Surgeon: Belva Crome, MD;  Location: [facility];  Service: Cardiovascular;  Laterality: N/A;  . CARDIAC CATHETERIZATION N/A 12/15/2015   Procedure: Coronary Stent Intervention;  Surgeon: Belva Crome, MD;  Location: [facility] [facility];  Service: Cardiovascular;  Laterality: N/A;  . COLONOSCOPY N/A 12/20/2015   Procedure: COLONOSCOPY;  Surgeon: Irene Shipper, MD;  Location: [facility];  Service: Endoscopy;  Laterality: N/A;  . EYE SURGERY Bilateral    "laser tx for glaucoma"  . HEMORRHOID SURGERY  ~ 1968  . SHOULDER ARTHROSCOPY W/  ROTATOR CUFF REPAIR Right 11/2014  . TOTAL ABDOMINAL HYSTERECTOMY  1971    There were no vitals filed for this visit.  Subjective Assessment - 09/26/19 1025    Subjective  Patient reports that she is feeling really good, not much issues    Currently in Pain?  No/denies                       [facility] Adult PT Treatment/Exercise - 09/26/19 0001      Shoulder Exercises: Standing   External Rotation  Strengthening;Left;20 reps;Weights    Theraband Level (Shoulder External Rotation)  Level 3 (Green)    Internal Rotation  Left;20 reps;Theraband    Theraband Level (Shoulder Internal Rotation)  Level 3 (Green)    Extension  Both;Strengthening;10 reps    Theraband Level (Shoulder Extension)  Level 3 (Green)    Row  Strengthening;Both;10 reps    Theraband Level (Shoulder Row)  Level 3 (Green)    Other Standing Exercises  isometric red tband abduction      Shoulder Exercises: ROM/Strengthening   UBE (Upper Arm Bike)  level 5 x 6 minutes ( 3 min each way)    Lat Pull  20 reps    Lat Pull Limitations  20#    Cybex Press  1 plate;20 reps    Cybex Press Limitations  she does feel a little  ache in the shoulder with this    Cybex Row  20 reps    Cybex Row Limitations  20#    Wall Wash  3# flexion, CW/CCW circles at head high, and then overhead side to side                  PT Long Term Goals - 09/26/19 1053      PT LONG TERM GOAL #1   Title  independent with HEP    Status  Achieved      PT LONG TERM GOAL #2   Title  independent with RICE    Status  Achieved      PT LONG TERM GOAL #3   Title  increase ROM of the right shoulder to 130 degrees flexion    Status  Achieved      PT LONG TERM GOAL #4   Title  dress and do hair without difficulty    Status  Achieved            Plan - 09/26/19 1052    Clinical Impression Statement  Patient doing very well, did not have any pain or discomfort with exercises today and I increased resistance, she has AROM and  PROM that are WNL's without discomfort    PT Next Visit Plan  will hold and d/c at the end of the mont if no issues, she is to do HEP, goals met    Consulted and Agree with Plan of Care  Patient       Patient will benefit from skilled therapeutic intervention in order to improve the following deficits and impairments:  Pain, Postural dysfunction, Decreased strength, Decreased range of motion  Visit Diagnosis: Stiffness of left shoulder, not elsewhere classified  Chronic left shoulder pain  Abnormal posture     Problem List Patient Active Problem List   Diagnosis Date Noted  . Diverticulosis of colon without hemorrhage   . GI bleeding 12/19/2015  . CAD S/P PCI 12/15/15 12/17/2015  . Coronary artery disease involving native coronary artery of native heart with angina pectoris ([place]) 12/17/2015  . ST elevation (STEMI) myocardial infarction involving other coronary artery of inferior wall ([place]) 12/15/2015  . Hyperlipidemia 12/15/2015  . VERTIGO 02/16/2009  . VAGINITIS, ATROPHIC, POSTMENOPAUSAL 12/06/2008  . Allergic rhinitis 06/02/2007  . Disorder of bone and cartilage 06/02/2007    Sumner Boast., PT 09/26/2019, 10:54 AM  [address] [place], [place], [address] Phone: [phone]   Fax:  [phone]  Name: BLAKELYNN SCHEELER MRN: [id number] Date of Birth: 1932/07/30

## 2019-09-28 ENCOUNTER — Ambulatory Visit: Payer: Medicare Other | Admitting: Psychology

## 2019-09-29 ENCOUNTER — Ambulatory Visit: Payer: Medicare Other | Admitting: Physical Therapy

## 2019-10-03 ENCOUNTER — Ambulatory Visit (INDEPENDENT_AMBULATORY_CARE_PROVIDER_SITE_OTHER): Payer: Medicare Other | Admitting: Psychology

## 2019-10-03 DIAGNOSIS — F331 Major depressive disorder, recurrent, moderate: Secondary | ICD-10-CM

## 2019-10-18 ENCOUNTER — Ambulatory Visit (INDEPENDENT_AMBULATORY_CARE_PROVIDER_SITE_OTHER): Payer: Medicare Other | Admitting: Psychology

## 2019-10-18 DIAGNOSIS — F331 Major depressive disorder, recurrent, moderate: Secondary | ICD-10-CM | POA: Diagnosis not present

## 2019-10-25 ENCOUNTER — Telehealth: Payer: Self-pay | Admitting: Family Medicine

## 2019-10-25 NOTE — Telephone Encounter (Signed)
Called pt and she said she will probably go with Dr Barron Alvine and said that she cant set up an appt right now because of her insurance and that she will call later to set up

## 2019-10-25 NOTE — Telephone Encounter (Signed)
Copied from CRM [id number]. Topic: Appointment Scheduling - Transfer of Care >> Oct 24, 2019  9:13 AM Baldo Daub L wrote: Pt calling states that she received a letter that she needs to transfer care.  Per FC send message. Pt has no preference of provider.  Pt can be reached at [phone] or [phone].

## 2019-12-31 NOTE — Progress Notes (Signed)
Cardiology Office Note:    Date:  01/02/2020   ID:  [name], DOB 03/02/1932, MRN [id number]  PCP:  Everrett Coombe, DO  Cardiologist:  [name], MD   Referring MD: Everrett Coombe, DO   Chief Complaint  Patient presents with  . Coronary Artery Disease    History of Present Illness:    Nancy Neal is a 86 y.o. female with a hx of acute inferior MI RCA DES February 2017 and documentation of residual 60-70% mid LAD, normal residual LV function, andhyperlipidemia.  [name] is doing well.  She still cannot believe that she had a cardiac event.  She denies chest pain, dyspnea, orthopnea, claudication, leg swelling, and any significant side effect to her medical regimen which at this point includes intermediate intensity statin therapy only.  During the pandemic she has not been exercising on a regular basis and also has not been mindful of her diet.  Past Medical History:  Diagnosis Date  . ALLERGIC RHINITIS 06/02/2007  . Arthritis    "maybe in my right knee" (12/19/2015)  . Coronary artery disease 11/2015   a. 11/2015: Inferior STEMI w/ Promus DES to RCA.    . Diverticulosis of colon 2003   by 2003 and 2008 colonoscopy.   Marland Kitchen GERD (gastroesophageal reflux disease)   . Glaucoma 1-12   open angle  . History of blood transfusion 1971   "when I had hysterectomy"  . Myocardial infarction (HCC) 11/2015  . OSTEOPENIA 06/02/2007  . VAGINITIS, ATROPHIC, POSTMENOPAUSAL 12/06/2008  . VERTIGO 02/16/2009    Past Surgical History:  Procedure Laterality Date  . APPENDECTOMY  1971  . CARDIAC CATHETERIZATION N/A 12/15/2015   Procedure: Left Heart Cath and Coronary Angiography;  Surgeon: Lyn Records, MD;  Location: [facility] INVASIVE CV LAB;  Service: Cardiovascular;  Laterality: N/A;  . CARDIAC CATHETERIZATION N/A 12/15/2015   Procedure: Coronary Stent Intervention;  Surgeon: Lyn Records, MD;  Location: [facility] INVASIVE CV LAB;  Service: Cardiovascular;  Laterality: N/A;  . COLONOSCOPY N/A  12/20/2015   Procedure: COLONOSCOPY;  Surgeon: Hilarie Fredrickson, MD;  Location: [facility] ENDOSCOPY;  Service: Endoscopy;  Laterality: N/A;  . EYE SURGERY Bilateral    "laser tx for glaucoma"  . HEMORRHOID SURGERY  ~ 1968  . SHOULDER ARTHROSCOPY W/ ROTATOR CUFF REPAIR Right 11/2014  . TOTAL ABDOMINAL HYSTERECTOMY  1971    Current Medications: Current Meds  Medication Sig  . acetaminophen (TYLENOL) 325 MG tablet Take 2 tablets (650 mg total) by mouth every 4 (four) hours as needed for headache or mild pain.  . Ascorbic Acid (VITAMIN C ER PO) Take 1 tablet by mouth daily.  Marland Kitchen aspirin 81 MG chewable tablet Chew 1 tablet (81 mg total) by mouth daily.  Marland Kitchen atorvastatin (LIPITOR) 20 MG tablet TAKE 1 TABLET(20 MG) BY MOUTH DAILY  . carboxymethylcellulose (REFRESH PLUS) 0.5 % SOLN Place 1 drop into both eyes 3 (three) times daily as needed (dry eyes).   . Cyanocobalamin (VITAMIN B-12 PO) Take 1 tablet by mouth daily.  Marland Kitchen MAGNESIUM PO Take by mouth.  . Multiple Vitamin (MULTIVITAMIN WITH MINERALS) TABS tablet Take 1 tablet by mouth daily.  . nitroGLYCERIN (NITROSTAT) 0.4 MG SL tablet PLACE 1 TABLET UNDER THE TONGUE EVERY 5 MINUTES AS NEEDED FOR CHEST PAIN  . OVER THE COUNTER MEDICATION Take 1 tablet by mouth daily. Over the counter antihistamine  . VITAMIN E PO Take 1 capsule by mouth daily.      Allergies:  Codeine sulfate, Meclizine, Acetaminophen, Morphine, and Tramadol   Social History   Socioeconomic History  . Marital status: Widowed    Spouse name: Not on file  . Number of children: Not on file  . Years of education: Not on file  . Highest education level: Not on file  Occupational History  . Not on file  Tobacco Use  . Smoking status: Never Smoker  . Smokeless tobacco: Never Used  Substance and Sexual Activity  . Alcohol use: Yes    Alcohol/week: 7.0 standard drinks    Types: 7 Glasses of wine per week  . Drug use: No  . Sexual activity: Never    Birth control/protection: Surgical  Other  Topics Concern  . Not on file  Social History Narrative  . Not on file   Social Determinants of Health   Financial Resource Strain:   . Difficulty of Paying Living Expenses:   Food Insecurity:   . Worried About Programme researcher, broadcasting/film/video in the Last Year:   . Barista in the Last Year:   Transportation Needs:   . Freight forwarder (Medical):   Marland Kitchen Lack of Transportation (Non-Medical):   Physical Activity:   . Days of Exercise per Week:   . Minutes of Exercise per Session:   Stress:   . Feeling of Stress :   Social Connections:   . Frequency of Communication with Friends and Family:   . Frequency of Social Gatherings with Friends and Family:   . Attends Religious Services:   . Active Member of Clubs or Organizations:   . Attends Banker Meetings:   Marland Kitchen Marital Status:      Family History: The patient's family history includes Arthritis in her brother; Heart disease in her father; Suicidality in her mother. There is no history of Breast cancer.  ROS:   Please see the history of present illness.    She is sleeping well.  No medication side effects.  All other systems reviewed and are negative.  EKGs/Labs/Other Studies Reviewed:    The following studies were reviewed today: No new data  EKG:  EKG normal sinus rhythm with normal appearance.  No change when compared to prior.  Recent Labs: No results found for requested labs within last 8760 hours.  Recent Lipid Panel    Component Value Date/Time   CHOL 139 12/31/2018 0953   TRIG 78 12/31/2018 0953   HDL 60 12/31/2018 0953   CHOLHDL 2.3 12/31/2018 0953   CHOLHDL 2.3 08/05/2016 0747   VLDL 15 08/05/2016 0747   LDLCALC 63 12/31/2018 0953   LDLDIRECT 123.2 12/18/2011 0954    Physical Exam:    VS:  BP 112/74   Pulse 74   Ht 5\' 5"  (1.651 m)   Wt 134 lb 9.6 oz (61.1 kg)   SpO2 96%   BMI 22.40 kg/m     Wt Readings from Last 3 Encounters:  01/02/20 134 lb 9.6 oz (61.1 kg)  12/31/18 133 lb 9.6 oz  (60.6 kg)  07/08/18 133 lb (60.3 kg)     GEN: Healthy-appearing. No acute distress HEENT: Normal NECK: No JVD. LYMPHATICS: No lymphadenopathy CARDIAC:  RRR without murmur, gallop, or edema. VASCULAR:  Normal Pulses. No bruits. RESPIRATORY:  Clear to auscultation without rales, wheezing or rhonchi  ABDOMEN: Soft, non-tender, non-distended, No pulsatile mass, MUSCULOSKELETAL: No deformity  SKIN: Warm and dry NEUROLOGIC:  Alert and oriented x 3 PSYCHIATRIC:  Normal affect   ASSESSMENT:  1. Coronary artery disease involving native coronary artery of native heart with angina pectoris ([place])   2. Pure hypercholesterolemia   3. Educated about COVID-19 virus infection    PLAN:    In order of problems listed above:  1. Secondary prevention discussed in detail. 2. Target LDL less than 70 3. COVID-19 vaccine has been achieved.  3W's is being practiced.  Overall education and awareness concerning secondary risk prevention was discussed in detail: LDL less than 70, hemoglobin A1c less than 7, blood pressure target less than 130/80 mmHg, >150 minutes of moderate aerobic activity per week, avoidance of smoking, weight control (via diet and exercise), and continued surveillance/management of/for obstructive sleep apnea.    Medication Adjustments/Labs and Tests Ordered: Current medicines are reviewed at length with the patient today.  Concerns regarding medicines are outlined above.  Orders Placed This Encounter  Procedures  . Lipid panel  . Basic metabolic panel  . Hepatic function panel  . EKG 12-Lead   No orders of the defined types were placed in this encounter.   Patient Instructions  Medication Instructions:  Your physician recommends that you continue on your current medications as directed. Please refer to the Current Medication list given to you today.  *If you need a refill on your cardiac medications before your next appointment, please call your pharmacy*   Lab  Work: Lipid, Liver and BMET today  If you have labs (blood work) drawn today and your tests are completely normal, you will receive your results only by: Marland Kitchen MyChart Message (if you have MyChart) OR . A paper copy in the mail If you have any lab test that is abnormal or we need to change your treatment, we will call you to review the results.   Testing/Procedures: None   Follow-Up: At [facility], you and your health needs are our priority.  As part of our continuing mission to provide you with exceptional heart care, we have created designated Provider Care Teams.  These Care Teams include your primary Cardiologist (physician) and Advanced Practice Providers (APPs -  Physician Assistants and Nurse Practitioners) who all work together to provide you with the care you need, when you need it.  We recommend signing up for the patient portal called "MyChart".  Sign up information is provided on this After Visit Summary.  MyChart is used to connect with patients for Virtual Visits (Telemedicine).  Patients are able to view lab/test results, encounter notes, upcoming appointments, etc.  Non-urgent messages can be sent to your provider as well.   To learn more about what you can do with MyChart, go to [email or web address].    Your next appointment:   12 month(s)  The format for your next appointment:   In Person  Provider:   You may see Sinclair Grooms, MD or one of the following Advanced Practice Providers on your designated Care Team:    Truitt Merle, NP  Cecilie Kicks, NP  Kathyrn Drown, NP    Other Instructions      Signed, Sinclair Grooms, MD  01/02/2020 11:03 AM    [place]

## 2020-01-02 ENCOUNTER — Ambulatory Visit (INDEPENDENT_AMBULATORY_CARE_PROVIDER_SITE_OTHER): Payer: Medicare Other | Admitting: Interventional Cardiology

## 2020-01-02 ENCOUNTER — Encounter: Payer: Self-pay | Admitting: Interventional Cardiology

## 2020-01-02 ENCOUNTER — Other Ambulatory Visit: Payer: Self-pay

## 2020-01-02 VITALS — BP 112/74 | HR 74 | Ht 65.0 in | Wt 134.6 lb

## 2020-01-02 DIAGNOSIS — I25119 Atherosclerotic heart disease of native coronary artery with unspecified angina pectoris: Secondary | ICD-10-CM

## 2020-01-02 DIAGNOSIS — Z7189 Other specified counseling: Secondary | ICD-10-CM

## 2020-01-02 DIAGNOSIS — E7800 Pure hypercholesterolemia, unspecified: Secondary | ICD-10-CM | POA: Diagnosis not present

## 2020-01-02 LAB — LIPID PANEL
Chol/HDL Ratio: 1.9 ratio (ref 0.0–4.4)
Cholesterol, Total: 128 mg/dL (ref 100–199)
HDL: 66 mg/dL (ref >39)
LDL Chol Calc (NIH): 47 mg/dL (ref 0–99)
Triglycerides: 76 mg/dL (ref 0–149)
VLDL Cholesterol Cal: 15 mg/dL (ref 5–40)

## 2020-01-02 LAB — BASIC METABOLIC PANEL
BUN/Creatinine Ratio: 22 (ref 12–28)
BUN: 21 mg/dL (ref 8–27)
CO2: 23 mmol/L (ref 20–29)
Calcium: 9.6 mg/dL (ref 8.7–10.3)
Chloride: 103 mmol/L (ref 96–106)
Creatinine, Ser: 0.94 mg/dL (ref 0.57–1.00)
GFR calc Af Amer: 64 mL/min/{1.73_m2} (ref >59)
GFR calc non Af Amer: 55 mL/min/{1.73_m2} — ABNORMAL LOW (ref >59)
Glucose: 86 mg/dL (ref 65–99)
Potassium: 4.7 mmol/L (ref 3.5–5.2)
Sodium: 140 mmol/L (ref 134–144)

## 2020-01-02 LAB — HEPATIC FUNCTION PANEL
ALT: 24 IU/L (ref 0–32)
AST: 29 IU/L (ref 0–40)
Albumin: 4.6 g/dL (ref 3.6–4.6)
Alkaline Phosphatase: 92 IU/L (ref 39–117)
Bilirubin Total: 1 mg/dL (ref 0.0–1.2)
Bilirubin, Direct: 0.24 mg/dL (ref 0.00–0.40)
Total Protein: 6.2 g/dL (ref 6.0–8.5)

## 2020-01-02 NOTE — Patient Instructions (Signed)
Medication Instructions:  Your physician recommends that you continue on your current medications as directed. Please refer to the Current Medication list given to you today.  *If you need a refill on your cardiac medications before your next appointment, please call your pharmacy*   Lab Work: Lipid, Liver and BMET today  If you have labs (blood work) drawn today and your tests are completely normal, you will receive your results only by: . MyChart Message (if you have MyChart) OR . A paper copy in the mail If you have any lab test that is abnormal or we need to change your treatment, we will call you to review the results.   Testing/Procedures: None   Follow-Up: At [facility], you and your health needs are our priority.  As part of our continuing mission to provide you with exceptional heart care, we have created designated Provider Care Teams.  These Care Teams include your primary Cardiologist (physician) and Advanced Practice Providers (APPs -  Physician Assistants and Nurse Practitioners) who all work together to provide you with the care you need, when you need it.  We recommend signing up for the patient portal called "MyChart".  Sign up information is provided on this After Visit Summary.  MyChart is used to connect with patients for Virtual Visits (Telemedicine).  Patients are able to view lab/test results, encounter notes, upcoming appointments, etc.  Non-urgent messages can be sent to your provider as well.   To learn more about what you can do with MyChart, go to [email or web address].    Your next appointment:   12 month(s)  The format for your next appointment:   In Person  Provider:   You may see Henry W Smith III, MD or one of the following Advanced Practice Providers on your designated Care Team:    Lori Gerhardt, NP  Laura Ingold, NP  Jill McDaniel, NP    Other Instructions   

## 2020-01-03 ENCOUNTER — Other Ambulatory Visit: Payer: Self-pay | Admitting: Interventional Cardiology

## 2020-01-03 DIAGNOSIS — E785 Hyperlipidemia, unspecified: Secondary | ICD-10-CM

## 2020-01-03 MED ORDER — ATORVASTATIN CALCIUM 20 MG PO TABS: ORAL_TABLET | ORAL | 3 refills | Status: DC

## 2020-02-07 ENCOUNTER — Telehealth: Payer: Self-pay | Admitting: Interventional Cardiology

## 2020-02-07 NOTE — Telephone Encounter (Signed)
Spoke with pt about CP.  She states that she has been having this pain since she started Atorvastatin a few years ago but she never really mentioned it to Dr. Katrinka Blazing because she knew she needed to be on it.  States she did mention it at last visit but declined to stop or change the medication at that time.  Pain feels like a pulled muscle.  Doesn't radiate anywhere.  No other issues.  Pt would like to come off of the Atorvastatin and try something else if Dr. Katrinka Blazing is agreeable.  Will route to Dr. Katrinka Blazing for review.

## 2020-02-07 NOTE — Telephone Encounter (Signed)
Pt c/o medication issue:  1. Name of Medication: atorvastatin (LIPITOR) 20 MG tablet  2. How are you currently taking this medication (dosage and times per day)? 1 TABLET(20 MG) BY MOUTH DAILY  3. Are you having a reaction (difficulty breathing--STAT)? Yes  4. What is your medication issue? Patient states she has been experiencing CP that comes and goes and she assumes the medication is the cause of the CP.   Pt c/o of Chest Pain: STAT if CP now or developed within 24 hours  1. Are you having CP right now? No  2. Are you experiencing any other symptoms (ex. SOB, nausea, vomiting, sweating)? No  3. How long have you been experiencing CP? Patient states she has had chest pain on and off since she first began taking medication.  4. Is your CP continuous or coming and going? Coming and going  5. Have you taken Nitroglycerin? No ?

## 2020-02-07 NOTE — Telephone Encounter (Signed)
Stop statin for 3-4 weeks to see if pain resolves. Call us back at that time and we will figure out what to do. If still present not likely related to Atorvastatin and we may decide to restart. Otherwise, we will prescribe something different.

## 2020-02-08 NOTE — Telephone Encounter (Signed)
Spoke with pt and made her aware of recommendations.  Pt verbalized understanding and was appreciative for call.  

## 2020-03-08 ENCOUNTER — Telehealth: Payer: Self-pay | Admitting: Interventional Cardiology

## 2020-03-08 NOTE — Telephone Encounter (Signed)
Pt c/o medication issue:  1. Name of Medication: atorvastatin (LIPITOR) 20 MG tablet  2. How are you currently taking this medication (dosage and times per day)? N/a   3. Are you having a reaction (difficulty breathing--STAT)? no  4. What is your medication issue? Patient was taken off this medication (see previous phone note from 02/07/20) for 3-4 weeks due to chest pain. 4 weeks have passed and patient states that the pain is gone but if she presses on her chest she can feel a little pain. She wants to know what to do from here. Should she remain off this medication for longer?

## 2020-03-08 NOTE — Telephone Encounter (Signed)
I spoke with patient. Pain she was having prior to stopping atorvastatin has improved. She reports areas on each side of her chest are now only sensitive when touched.  She will continue to hold atorvastatin and I will send message to Dr Katrinka Blazing for review/recommendations.

## 2020-03-11 NOTE — Telephone Encounter (Signed)
Resume atorvastatin at 10 mg daily.

## 2020-03-12 MED ORDER — ATORVASTATIN CALCIUM 10 MG PO TABS: 10.0000 mg | ORAL_TABLET | Freq: Every day | ORAL | 3 refills | Status: DC

## 2020-03-12 NOTE — Telephone Encounter (Signed)
Spoke with pt and reviewed recommendations.  Pt agreeable to plan.  

## 2020-03-12 NOTE — Telephone Encounter (Signed)
Transferred call to Jennifer.

## 2020-03-12 NOTE — Telephone Encounter (Signed)
Left message to call back  

## 2020-04-02 ENCOUNTER — Other Ambulatory Visit: Payer: Self-pay

## 2020-04-02 ENCOUNTER — Ambulatory Visit (INDEPENDENT_AMBULATORY_CARE_PROVIDER_SITE_OTHER): Payer: Medicare Other | Admitting: Podiatry

## 2020-04-02 ENCOUNTER — Encounter: Payer: Self-pay | Admitting: Podiatry

## 2020-04-02 VITALS — BP 122/74 | HR 74 | Temp 98.1°F | Resp 16

## 2020-04-02 DIAGNOSIS — M2042 Other hammer toe(s) (acquired), left foot: Secondary | ICD-10-CM

## 2020-04-02 DIAGNOSIS — B351 Tinea unguium: Secondary | ICD-10-CM | POA: Diagnosis not present

## 2020-04-02 DIAGNOSIS — M79674 Pain in right toe(s): Secondary | ICD-10-CM | POA: Diagnosis not present

## 2020-04-02 DIAGNOSIS — I25119 Atherosclerotic heart disease of native coronary artery with unspecified angina pectoris: Secondary | ICD-10-CM | POA: Diagnosis not present

## 2020-04-02 DIAGNOSIS — M2041 Other hammer toe(s) (acquired), right foot: Secondary | ICD-10-CM | POA: Diagnosis not present

## 2020-04-02 NOTE — Progress Notes (Signed)
Subjective:   Patient ID: Nancy Neal, female   DOB: 86 y.o.   MRN: [id number]   HPI Patient presents stating her nails have discolored on her right nailbeds that are thick and they are hard for her to cut and get ingrown in the corners.  Has tried warm medicine a number years ago which only helped temporarily and would not like to go that route and does not smoke likes to be active   Review of Systems  All other systems reviewed and are negative.       Objective:  Physical Exam Vitals and nursing note reviewed.  Constitutional:      Appearance: She is well-developed.  Pulmonary:     Effort: Pulmonary effort is normal.  Musculoskeletal:        General: Normal range of motion.  Skin:    General: Skin is warm.  Neurological:     Mental Status: She is alert.     Neurovascular status found to be intact muscle strength was adequate range of motion within normal limits.  Patient is noted to have thick yellow brittle nailbeds 1-5 of the right foot with thick brittle type discoloration and material formation.  The left looks relatively normal it appears to only have affected the right foot.  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  She will continue the same type of treatment and will be seen back as needed

## 2020-04-02 NOTE — Progress Notes (Signed)
   Subjective:    Patient ID: Nancy Neal, female    DOB: 12/09/1931, 86 y.o.   MRN: [id number]  HPI    Review of Systems  All other systems reviewed and are negative.      Objective:   Physical Exam        Assessment & Plan:

## 2020-04-25 ENCOUNTER — Other Ambulatory Visit: Payer: Self-pay | Admitting: Family Medicine

## 2020-04-25 DIAGNOSIS — Z1231 Encounter for screening mammogram for malignant neoplasm of breast: Secondary | ICD-10-CM

## 2020-04-25 DIAGNOSIS — H401111 Primary open-angle glaucoma, right eye, mild stage: Secondary | ICD-10-CM | POA: Diagnosis not present

## 2020-04-25 DIAGNOSIS — Z961 Presence of intraocular lens: Secondary | ICD-10-CM | POA: Diagnosis not present

## 2020-04-25 DIAGNOSIS — H401122 Primary open-angle glaucoma, left eye, moderate stage: Secondary | ICD-10-CM | POA: Diagnosis not present

## 2020-05-24 ENCOUNTER — Other Ambulatory Visit: Payer: Self-pay

## 2020-05-25 ENCOUNTER — Encounter: Payer: Self-pay | Admitting: Nurse Practitioner

## 2020-05-25 ENCOUNTER — Ambulatory Visit (INDEPENDENT_AMBULATORY_CARE_PROVIDER_SITE_OTHER): Payer: Medicare Other | Admitting: Nurse Practitioner

## 2020-05-25 VITALS — BP 150/100 | HR 88 | Temp 97.0°F | Resp 18 | Wt 136.0 lb

## 2020-05-25 DIAGNOSIS — M545 Low back pain, unspecified: Secondary | ICD-10-CM

## 2020-05-25 MED ORDER — NAPROXEN 500 MG PO TABS: 500.0000 mg | ORAL_TABLET | Freq: Two times a day (BID) | ORAL | 0 refills | Status: DC

## 2020-05-25 NOTE — Patient Instructions (Signed)
Ok to use Biofreeze gel or Icy Hot gel as needed for pain Monitor BP at home. Check BP once daily.  Call office if BP persistently >140/80.  Acute Back Pain, Adult Acute back pain is sudden and usually [place]-lived. It is often caused by an injury to the muscles and tissues in the back. The injury may result from:  A muscle or ligament getting overstretched or torn (strained). Ligaments are tissues that connect bones to each other. Lifting something improperly can cause a back strain.  Wear and tear (degeneration) of the spinal disks. Spinal disks are circular tissue that provides cushioning between the bones of the spine (vertebrae).  Twisting motions, such as while playing sports or doing yard work.  A hit to the back.  Arthritis. You may have a physical exam, lab tests, and imaging tests to find the cause of your pain. Acute back pain usually goes away with rest and home care. Follow these instructions at home: Managing pain, stiffness, and swelling  Take over-the-counter and prescription medicines only as told by your health care provider.  Your health care provider may recommend applying ice during the first 24-48 hours after your pain starts. To do this: ? Put ice in a plastic bag. ? Place a towel between your skin and the bag. ? Leave the ice on for 20 minutes, 2-3 times a day.  If directed, apply heat to the affected area as often as told by your health care provider. Use the heat source that your health care provider recommends, such as a moist heat pack or a heating pad. ? Place a towel between your skin and the heat source. ? Leave the heat on for 20-30 minutes. ? Remove the heat if your skin turns bright red. This is especially important if you are unable to feel pain, heat, or cold. You have a greater risk of getting burned. Activity   Do not stay in bed. Staying in bed for more than 1-2 days can delay your recovery.  Sit up and stand up straight. Avoid leaning forward  when you sit, or hunching over when you stand. ? If you work at a desk, sit close to it so you do not need to lean over. Keep your chin tucked in. Keep your neck drawn back, and keep your elbows bent at a right angle. Your arms should look like the letter "L." ? Sit high and close to the steering wheel when you drive. Add lower back (lumbar) support to your car seat, if needed.  Take [place] walks on even surfaces as soon as you are able. Try to increase the length of time you walk each day.  Do not sit, drive, or stand in one place for more than 30 minutes at a time. Sitting or standing for long periods of time can put stress on your back.  Do not drive or use heavy machinery while taking prescription pain medicine.  Use proper lifting techniques. When you bend and lift, use positions that put less stress on your back: ? [place] your knees. ? Keep the load close to your body. ? Avoid twisting.  Exercise regularly as told by your health care provider. Exercising helps your back heal faster and helps prevent back injuries by keeping muscles strong and flexible.  Work with a physical therapist to make a safe exercise program, as recommended by your health care provider. Do any exercises as told by your physical therapist. Lifestyle  Maintain a healthy weight. Extra weight puts  stress on your back and makes it difficult to have good posture.  Avoid activities or situations that make you feel anxious or stressed. Stress and anxiety increase muscle tension and can make back pain worse. Learn ways to manage anxiety and stress, such as through exercise. General instructions  Sleep on a firm mattress in a comfortable position. Try lying on your side with your knees slightly bent. If you lie on your back, put a pillow under your knees.  Follow your treatment plan as told by your health care provider. This may include: ? Cognitive or behavioral therapy. ? Acupuncture or massage therapy. ? Meditation  or yoga. Contact a health care provider if:  You have pain that is not relieved with rest or medicine.  You have increasing pain going down into your legs or buttocks.  Your pain does not improve after 2 weeks.  You have pain at night.  You lose weight without trying.  You have a fever or chills. Get help right away if:  You develop new bowel or bladder control problems.  You have unusual weakness or numbness in your arms or legs.  You develop nausea or vomiting.  You develop abdominal pain.  You feel faint. Summary  Acute back pain is sudden and usually [place]-lived.  Use proper lifting techniques. When you bend and lift, use positions that put less stress on your back.  Take over-the-counter and prescription medicines and apply heat or ice as directed by your health care provider. This information is not intended to replace advice given to you by your health care provider. Make sure you discuss any questions you have with your health care provider. Document Revised: 01/25/2019 Document Reviewed: 05/20/2017 Elsevier Patient Education  2020 [employer].

## 2020-05-25 NOTE — Progress Notes (Signed)
Subjective:  Patient ID: Nancy Neal, female    DOB: April 02, 1932  Age: 86 y.o. MRN: [id number]  CC: Back Pain (Carried a 84 year old from the park this week) and Black Eye (Tripped over a curb and hit eye )  Back Pain This is a new problem. The current episode started in the past 7 days. The problem occurs constantly. The problem is unchanged. The pain is present in the lumbar spine. The quality of the pain is described as aching. The pain does not radiate. The pain is the same all the time. The symptoms are aggravated by standing and bending. Stiffness is present all day. Pertinent negatives include no bladder incontinence, bowel incontinence, dysuria, leg pain, paresis, paresthesias, pelvic pain, perianal numbness, tingling or weakness. Risk factors include sedentary lifestyle, poor posture, lack of exercise and menopause. She has tried nothing for the symptoms.  onset after carrying her 4years old grandson  Reviewed past Medical, Social and Family history today.  Outpatient Medications Prior to Visit  Medication Sig Dispense Refill  . acetaminophen (TYLENOL) 325 MG tablet Take 2 tablets (650 mg total) by mouth every 4 (four) hours as needed for headache or mild pain.    . Ascorbic Acid (VITAMIN C ER PO) Take 1 tablet by mouth daily.    Marland Kitchen aspirin 81 MG chewable tablet Chew 1 tablet (81 mg total) by mouth daily.    Marland Kitchen atorvastatin (LIPITOR) 10 MG tablet Take 1 tablet (10 mg total) by mouth daily. 90 tablet 3  . carboxymethylcellulose (REFRESH PLUS) 0.5 % SOLN Place 1 drop into both eyes 3 (three) times daily as needed (dry eyes).     . Cyanocobalamin (VITAMIN B-12 PO) Take 1 tablet by mouth daily.    Marland Kitchen MAGNESIUM PO Take by mouth.    . Multiple Vitamin (MULTIVITAMIN WITH MINERALS) TABS tablet Take 1 tablet by mouth daily.    . nitroGLYCERIN (NITROSTAT) 0.4 MG SL tablet PLACE 1 TABLET UNDER THE TONGUE EVERY 5 MINUTES AS NEEDED FOR CHEST PAIN 25 tablet 3  . OVER THE COUNTER MEDICATION Take 1  tablet by mouth daily. Over the counter antihistamine    . VITAMIN E PO Take 1 capsule by mouth daily.      No facility-administered medications prior to visit.    ROS See HPI  Objective:  BP (!) 150/100 (BP Location: Left Arm, Patient Position: Sitting, Cuff Size: Normal)   Pulse 88   Temp (!) 97 F (36.1 C) (Temporal)   Resp 18   Wt 136 lb (61.7 kg)   SpO2 97%   BMI 22.63 kg/m   Physical Exam Vitals reviewed.  Cardiovascular:     Rate and Rhythm: Normal rate.     Pulses: Normal pulses.  Pulmonary:     Effort: Pulmonary effort is normal.     Breath sounds: Normal breath sounds.  Musculoskeletal:     Thoracic back: Normal.     Lumbar back: Tenderness present. No swelling. Normal range of motion. Negative right straight leg raise test and negative left straight leg raise test.     Right lower leg: No edema.     Left lower leg: No edema.  Neurological:     Mental Status: She is alert and oriented to person, place, and time.  Psychiatric:        Mood and Affect: Mood normal.        Behavior: Behavior normal.        Thought Content: Thought content normal.  Assessment & Plan:  This visit occurred during the SARS-CoV-2 public health emergency.  Safety protocols were in place, including screening questions prior to the visit, additional usage of staff PPE, and extensive cleaning of exam room while observing appropriate contact time as indicated for disinfecting solutions.   Callyn was seen today for back pain and black eye.  Diagnoses and all orders for this visit:  Acute bilateral low back pain without sciatica -     naproxen (NAPROSYN) 500 MG tablet; Take 1 tablet (500 mg total) by mouth 2 (two) times daily with a meal.  Ok to use Biofreeze gel or Icy Hot gel as needed for pain Monitor BP at home. Check BP once daily.  Call office if BP persistently >140/80.  Problem List Items Addressed This Visit    None    Visit Diagnoses    Acute bilateral low back pain  without sciatica    -  Primary   Relevant Medications   naproxen (NAPROSYN) 500 MG tablet      Follow-up: No follow-ups on file.  Alysia Penna, NP

## 2020-05-28 ENCOUNTER — Telehealth: Payer: Self-pay | Admitting: Interventional Cardiology

## 2020-05-28 ENCOUNTER — Telehealth: Payer: Self-pay | Admitting: Nurse Practitioner

## 2020-05-28 NOTE — Telephone Encounter (Signed)
Pt c/o BP issue: STAT if pt c/o blurred vision, one-sided weakness or slurred speech  1. What are your last 5 BP readings?  141/86 139/89 129/78 150/90 127/77  2. Are you having any other symptoms (ex. Dizziness, headache, blurred vision, passed out)? No  3. What is your BP issue? BP has been elevated. Pt c/o of Chest Pain: STAT if CP now or developed within 24 hours  1. Are you having CP right now? No   2. Are you experiencing any other symptoms (ex. SOB, nausea, vomiting, sweating)? Nausea, sweats  3. How long have you been experiencing CP? about 2 days  4. Is your CP continuous or coming and going? Coming and going  5. Have you taken Nitroglycerin? Yes  ?

## 2020-05-28 NOTE — Telephone Encounter (Signed)
Pt called and said you told her to keep up with her blood pressure over the weekend.  Saturday high 139/89 137/86. Sunday was about the same.  Today 127/77 128/76, She wants to check tomorrow as well and she will call back.

## 2020-05-28 NOTE — Telephone Encounter (Signed)
I spoke with patient.  She reports one episode of chest pain last night.  Occurred during the night and woke her up.  Relieved with one NTG.  No further episodes of pain. Episode of sweating and nausea was two days ago.  She recently injured her back and was started on Naproxen.  She reports she is not good with medicines.  Reports now her stomach is just a little upset. BP was elevated when she saw PCP recently and she was told to monitor and call with readings.  Patient will follow up with PCP regarding BP and stomach upset. I asked patient to let us know if she has increased frequency of chest pain.  Use of NTG and ER precautions reviewed with patient.

## 2020-05-28 NOTE — Telephone Encounter (Signed)
Called to make pt aware, left message

## 2020-05-28 NOTE — Telephone Encounter (Signed)
Acceptable BP readings. Daily reading no longer needed Check BP once a week.  Thank you

## 2020-05-29 NOTE — Telephone Encounter (Signed)
Just an FYI: Patient states that her blood pressure has regulated and she is "back to normal" and will not call back unless she needs [name].

## 2020-05-30 NOTE — Telephone Encounter (Signed)
Okay 

## 2020-06-07 ENCOUNTER — Ambulatory Visit
Admission: RE | Admit: 2020-06-07 | Discharge: 2020-06-07 | Disposition: A | Discharge status: Home / Self Care | Payer: Medicare Other | Source: Ambulatory Visit | Attending: Family Medicine | Admitting: Family Medicine

## 2020-06-07 DIAGNOSIS — Z1231 Encounter for screening mammogram for malignant neoplasm of breast: Secondary | ICD-10-CM

## 2020-06-11 ENCOUNTER — Other Ambulatory Visit: Payer: Self-pay | Admitting: Family Medicine

## 2020-06-11 DIAGNOSIS — R928 Other abnormal and inconclusive findings on diagnostic imaging of breast: Secondary | ICD-10-CM

## 2020-06-21 ENCOUNTER — Ambulatory Visit
Admission: RE | Admit: 2020-06-21 | Discharge: 2020-06-21 | Disposition: A | Discharge status: Home / Self Care | Payer: Medicare Other | Source: Ambulatory Visit | Attending: Family Medicine | Admitting: Family Medicine

## 2020-06-21 ENCOUNTER — Other Ambulatory Visit: Payer: Self-pay

## 2020-06-21 DIAGNOSIS — R928 Other abnormal and inconclusive findings on diagnostic imaging of breast: Secondary | ICD-10-CM | POA: Diagnosis not present

## 2020-06-21 DIAGNOSIS — N6489 Other specified disorders of breast: Secondary | ICD-10-CM | POA: Diagnosis not present

## 2020-06-26 ENCOUNTER — Ambulatory Visit: Payer: Federal, State, Local not specified - PPO | Admitting: Physician Assistant

## 2020-07-04 ENCOUNTER — Other Ambulatory Visit: Payer: Self-pay

## 2020-07-04 ENCOUNTER — Ambulatory Visit (INDEPENDENT_AMBULATORY_CARE_PROVIDER_SITE_OTHER): Payer: Medicare Other | Admitting: Family Medicine

## 2020-07-04 ENCOUNTER — Encounter: Payer: Self-pay | Admitting: Family Medicine

## 2020-07-04 VITALS — BP 120/74 | HR 75 | Temp 97.1°F | Ht 64.0 in | Wt 134.2 lb

## 2020-07-04 DIAGNOSIS — M8589 Other specified disorders of bone density and structure, multiple sites: Secondary | ICD-10-CM | POA: Diagnosis not present

## 2020-07-04 DIAGNOSIS — E7800 Pure hypercholesterolemia, unspecified: Secondary | ICD-10-CM | POA: Diagnosis not present

## 2020-07-04 DIAGNOSIS — Z23 Encounter for immunization: Secondary | ICD-10-CM | POA: Diagnosis not present

## 2020-07-04 DIAGNOSIS — Z9861 Coronary angioplasty status: Secondary | ICD-10-CM | POA: Diagnosis not present

## 2020-07-04 DIAGNOSIS — I2510 Atherosclerotic heart disease of native coronary artery without angina pectoris: Secondary | ICD-10-CM

## 2020-07-04 DIAGNOSIS — I25119 Atherosclerotic heart disease of native coronary artery with unspecified angina pectoris: Secondary | ICD-10-CM

## 2020-07-04 DIAGNOSIS — Z0000 Encounter for general adult medical examination without abnormal findings: Secondary | ICD-10-CM

## 2020-07-04 NOTE — Progress Notes (Addendum)
Nancy Neal is a 86 y.o. female  Chief Complaint  Patient presents with  . Establish Care    TOC (Dr Ashley Royalty),     HPI: Nancy Neal is a 86 y.o. female who is formerly a patient of Dr. Ashley Royalty and is seen today for f/u on chronic medical issues including hyperlipidemia, CAD, GERD.  She follows with cardio Dr. Verdis Prime, ophthalmology Dr. Valere Dross.  Last labs (lipids, BMP, hepatic function panel) in 12/2019 - all normal/at goal.  Last mammo: 06/2020 Last Dexa: 2008 - osteopenia Last colonoscopy: 12/2015 - diagnostic colonoscopy done at [facility] hospital for rectal bleeding --> moderate diverticulosis, small internal and external hemorrhoids (Dr. Marina Goodell [facility])  Diet/Exercise: good appetite, well-balanced diet; active Vision: UTD  Pt complains of episodes of urinary urgency and occasional incontinence that occur only at night. Does not occur every night. No nocturnal frequency. She does urinate before bed. She does not restrict fluids prior to bed. No dysuria, gross hematuria.   Past Medical History:  Diagnosis Date  . ALLERGIC RHINITIS 06/02/2007  . Arthritis    "maybe in my right knee" (12/19/2015)  . Coronary artery disease 11/2015   a. 11/2015: Inferior STEMI w/ Promus DES to RCA.    . Diverticulosis of colon 2003   by 2003 and 2008 colonoscopy.   Marland Kitchen GERD (gastroesophageal reflux disease)   . Glaucoma 1-12   open angle  . History of blood transfusion 1971   "when I had hysterectomy"  . Myocardial infarction (HCC) 11/2015  . OSTEOPENIA 06/02/2007  . VAGINITIS, ATROPHIC, POSTMENOPAUSAL 12/06/2008  . VERTIGO 02/16/2009    Past Surgical History:  Procedure Laterality Date  . APPENDECTOMY  1971  . CARDIAC CATHETERIZATION N/A 12/15/2015   Procedure: Left Heart Cath and Coronary Angiography;  Surgeon: Lyn Records, MD;  Location: [facility] INVASIVE CV LAB;  Service: Cardiovascular;  Laterality: N/A;  . CARDIAC CATHETERIZATION N/A 12/15/2015   Procedure: Coronary Stent Intervention;  Surgeon:  Lyn Records, MD;  Location: [facility] INVASIVE CV LAB;  Service: Cardiovascular;  Laterality: N/A;  . COLONOSCOPY N/A 12/20/2015   Procedure: COLONOSCOPY;  Surgeon: Hilarie Fredrickson, MD;  Location: [facility] ENDOSCOPY;  Service: Endoscopy;  Laterality: N/A;  . EYE SURGERY Bilateral    "laser tx for glaucoma"  . HEMORRHOID SURGERY  ~ 1968  . SHOULDER ARTHROSCOPY W/ ROTATOR CUFF REPAIR Right 11/2014  . TOTAL ABDOMINAL HYSTERECTOMY  1971    Social History   Socioeconomic History  . Marital status: Widowed    Spouse name: Not on file  . Number of children: Not on file  . Years of education: Not on file  . Highest education level: Not on file  Occupational History  . Not on file  Tobacco Use  . Smoking status: Never Smoker  . Smokeless tobacco: Never Used  Vaping Use  . Vaping Use: Never used  Substance and Sexual Activity  . Alcohol use: Yes    Alcohol/week: 7.0 standard drinks    Types: 7 Glasses of wine per week  . Drug use: No  . Sexual activity: Never    Birth control/protection: Surgical  Other Topics Concern  . Not on file  Social History Narrative  . Not on file   Social Determinants of Health   Financial Resource Strain:   . Difficulty of Paying Living Expenses: Not on file  Food Insecurity:   . Worried About Programme researcher, broadcasting/film/video in the Last Year: Not on file  . Ran Out of  Food in the Last Year: Not on file  Transportation Needs:   . Lack of Transportation (Medical): Not on file  . Lack of Transportation (Non-Medical): Not on file  Physical Activity:   . Days of Exercise per Week: Not on file  . Minutes of Exercise per Session: Not on file  Stress:   . Feeling of Stress : Not on file  Social Connections:   . Frequency of Communication with Friends and Family: Not on file  . Frequency of Social Gatherings with Friends and Family: Not on file  . Attends Religious Services: Not on file  . Active Member of Clubs or Organizations: Not on file  . Attends Banker  Meetings: Not on file  . Marital Status: Not on file  Intimate Partner Violence:   . Fear of Current or Ex-Partner: Not on file  . Emotionally Abused: Not on file  . Physically Abused: Not on file  . Sexually Abused: Not on file    Family History  Problem Relation Age of Onset  . Suicidality Mother   . Heart disease Father   . Arthritis Brother   . Breast cancer Neg Hx      Immunization History  Administered Date(s) Administered  . Fluad Quad(high Dose 65+) 07/04/2020  . Influenza Split 07/16/2011, 07/12/2012  . Influenza Whole 08/03/2007, 07/20/2008, 08/05/2010, 07/15/2013  . Influenza, High Dose Seasonal PF 08/04/2016  . Influenza,inj,Quad PF,6+ Mos 08/02/2014  . Influenza-Unspecified 08/17/2015, 07/20/2018  . Moderna SARS-COVID-2 Vaccination 10/21/2019, 11/28/2019  . Pneumococcal Conjugate-13 01/04/2014  . Pneumococcal Polysaccharide-23 12/18/2011  . Tetanus 01/04/2014  . Zoster 07/25/2008  . Zoster Recombinat (Shingrix) 04/20/2017    Outpatient Encounter Medications as of 07/04/2020  Medication Sig  . acetaminophen (TYLENOL) 325 MG tablet Take 2 tablets (650 mg total) by mouth every 4 (four) hours as needed for headache or mild pain.  . Ascorbic Acid (VITAMIN C ER PO) Take 1 tablet by mouth daily.  Marland Kitchen aspirin 81 MG chewable tablet Chew 1 tablet (81 mg total) by mouth daily.  Marland Kitchen atorvastatin (LIPITOR) 10 MG tablet Take 1 tablet (10 mg total) by mouth daily.  . carboxymethylcellulose (REFRESH PLUS) 0.5 % SOLN Place 1 drop into both eyes 3 (three) times daily as needed (dry eyes).   . Cyanocobalamin (VITAMIN B-12 PO) Take 1 tablet by mouth daily.  Marland Kitchen MAGNESIUM PO Take by mouth.  . Multiple Vitamin (MULTIVITAMIN WITH MINERALS) TABS tablet Take 1 tablet by mouth daily.  . naproxen (NAPROSYN) 500 MG tablet Take 1 tablet (500 mg total) by mouth 2 (two) times daily with a meal.  . nitroGLYCERIN (NITROSTAT) 0.4 MG SL tablet PLACE 1 TABLET UNDER THE TONGUE EVERY 5 MINUTES AS NEEDED  FOR CHEST PAIN  . OVER THE COUNTER MEDICATION Take 1 tablet by mouth daily. Over the counter antihistamine  . VITAMIN E PO Take 1 capsule by mouth daily.    No facility-administered encounter medications on file as of 07/04/2020.     ROS: Gen: no fever, chills  Skin: no rash, itching ENT: no ear pain, ear drainage, nasal congestion, rhinorrhea, sinus pressure, sore throat Eyes: no blurry vision, double vision Resp: no cough, wheeze,SOB CV: no CP, palpitations, LE edema,  GI: no heartburn, n/v/d/c, abd pain GU: see HPI MSK: low back pain - improving Neuro: no dizziness, headache, weakness Psych: no depression, anxiety, insomnia   Allergies  Allergen Reactions  . Codeine Sulfate Nausea And Vomiting  . Meclizine Other (See Comments)    Other  reaction(s): Other (See Comments) glaucoma glaucoma  . Acetaminophen Nausea Only    Pain Medicines make her sick  . Morphine   . Tramadol     BP 120/74   Pulse 75   Temp (!) 97.1 F (36.2 C) (Temporal)   Ht 5\' 4"  (1.626 m)   Wt 134 lb 3.2 oz (60.9 kg)   SpO2 97%   BMI 23.04 kg/m    BP Readings from Last 3 Encounters:  07/04/20 120/74  05/25/20 (!) 150/100  04/02/20 122/74   Pulse Readings from Last 3 Encounters:  07/04/20 75  05/25/20 88  04/02/20 74   Wt Readings from Last 3 Encounters:  07/04/20 134 lb 3.2 oz (60.9 kg)  05/25/20 136 lb (61.7 kg)  01/02/20 134 lb 9.6 oz (61.1 kg)     Physical Exam Constitutional:      General: She is not in acute distress.    Appearance: She is well-developed.  HENT:     Right Ear: Tympanic membrane and ear canal normal.     Left Ear: Tympanic membrane and ear canal normal.     Nose: Nose normal.  Eyes:     Conjunctiva/sclera: Conjunctivae normal.  Neck:     Thyroid: No thyromegaly.  Cardiovascular:     Rate and Rhythm: Normal rate and regular rhythm.     Pulses: Normal pulses.  Pulmonary:     Effort: Pulmonary effort is normal. No respiratory distress.     Breath  sounds: Normal breath sounds. No wheezing or rhonchi.  Abdominal:     General: Bowel sounds are normal. There is no distension.     Palpations: Abdomen is soft. There is no mass.     Tenderness: There is no abdominal tenderness.  Musculoskeletal:     Cervical back: Neck supple.     Right lower leg: No edema.     Left lower leg: No edema.  Lymphadenopathy:     Cervical: No cervical adenopathy.  Skin:    General: Skin is warm and dry.  Neurological:     Mental Status: She is alert and oriented to person, place, and time.     Motor: No abnormal muscle tone.     Coordination: Coordination normal.  Psychiatric:        Mood and Affect: Mood normal.        Behavior: Behavior normal.    Lab Results  Component Value Date   CHOL 128 01/02/2020   HDL 66 01/02/2020   LDLCALC 47 01/02/2020   LDLDIRECT 123.2 12/18/2011   TRIG 76 01/02/2020   CHOLHDL 1.9 01/02/2020   Lab Results  Component Value Date   BUN 21 01/02/2020   Lab Results  Component Value Date   CREATININE 0.94 01/02/2020   Lab Results  Component Value Date   NA 140 01/02/2020   K 4.7 01/02/2020   CL 103 01/02/2020   CO2 23 01/02/2020   Lab Results  Component Value Date   ALT 24 01/02/2020   AST 29 01/02/2020   ALKPHOS 92 01/02/2020   BILITOT 1.0 01/02/2020     A/P:  1. Pure hypercholesterolemia - at goal, last FLP in 12/2019 - on lipitor 10mg  daily  2. Coronary artery disease involving native coronary artery of native heart with angina pectoris (HCC) 3. CAD S/P PCI 12/15/15 - follows with cardio annually  - cont current meds  4. Osteopenia of multiple sites - DG Bone Density; Future  5. Annual physical exam - UTD on mammo, colonoscopy  -  due for Dexa - will get flu vaccine today - vision exam UTD - discussed importance of regular CV exercise, healthy diet, adequate sleep   This visit occurred during the SARS-CoV-2 public health emergency.  Safety protocols were in place, including screening  questions prior to the visit, additional usage of staff PPE, and extensive cleaning of exam room while observing appropriate contact time as indicated for disinfecting solutions.

## 2020-07-04 NOTE — Addendum Note (Signed)
Addended by: Mickle Plumb L on: 07/04/2020 10:20 AM   Modules accepted: Orders

## 2020-10-31 DIAGNOSIS — B351 Tinea unguium: Secondary | ICD-10-CM | POA: Diagnosis not present

## 2020-11-26 DIAGNOSIS — M5459 Other low back pain: Secondary | ICD-10-CM | POA: Diagnosis not present

## 2020-12-10 ENCOUNTER — Other Ambulatory Visit: Payer: Medicare Other

## 2020-12-10 DIAGNOSIS — Z20822 Contact with and (suspected) exposure to covid-19: Secondary | ICD-10-CM | POA: Diagnosis not present

## 2020-12-11 LAB — NOVEL CORONAVIRUS, NAA: SARS-CoV-2, NAA: NOT DETECTED

## 2020-12-11 LAB — SARS-COV-2, NAA 2 DAY TAT

## 2020-12-15 DIAGNOSIS — M545 Low back pain, unspecified: Secondary | ICD-10-CM | POA: Diagnosis not present

## 2020-12-15 DIAGNOSIS — M5459 Other low back pain: Secondary | ICD-10-CM | POA: Diagnosis not present

## 2020-12-21 DIAGNOSIS — M5459 Other low back pain: Secondary | ICD-10-CM | POA: Diagnosis not present

## 2021-01-03 ENCOUNTER — Encounter: Payer: Self-pay | Admitting: Physical Therapy

## 2021-01-03 ENCOUNTER — Other Ambulatory Visit: Payer: Self-pay

## 2021-01-03 ENCOUNTER — Ambulatory Visit: Payer: Medicare Other | Attending: Specialist | Admitting: Physical Therapy

## 2021-01-03 DIAGNOSIS — M5441 Lumbago with sciatica, right side: Secondary | ICD-10-CM | POA: Diagnosis not present

## 2021-01-03 DIAGNOSIS — M6281 Muscle weakness (generalized): Secondary | ICD-10-CM | POA: Diagnosis not present

## 2021-01-03 DIAGNOSIS — G8929 Other chronic pain: Secondary | ICD-10-CM | POA: Diagnosis not present

## 2021-01-03 DIAGNOSIS — R252 Cramp and spasm: Secondary | ICD-10-CM

## 2021-01-03 NOTE — Therapy (Signed)
[facility] [place] [facility] [address] [place], [place], [address] Phone: [phone]   Fax:  [phone]  Physical Therapy Evaluation  Patient Details  Name: Nancy Neal MRN: [id number] Date of Birth: September 05, 1932 Referring Provider (PT): Cirigliano   Encounter Date: 01/03/2021   PT End of Session - 01/03/21 1002    Visit Number 1    Date for PT Re-Evaluation 03/05/21    PT Start Time 0928    PT Stop Time 1002    PT Time Calculation (min) 34 min    Activity Tolerance Patient tolerated treatment well    Behavior During Therapy [facility] for tasks assessed/performed           Past Medical History:  Diagnosis Date  . ALLERGIC RHINITIS 06/02/2007  . Arthritis    "maybe in my right knee" (12/19/2015)  . Coronary artery disease 11/2015   a. 11/2015: Inferior STEMI w/ Promus DES to RCA.    . Diverticulosis of colon 2003   by 2003 and 2008 colonoscopy.   Marland Kitchen GERD (gastroesophageal reflux disease)   . Glaucoma 1-12   open angle  . History of blood transfusion 1971   "when I had hysterectomy"  . Myocardial infarction (HCC) 11/2015  . OSTEOPENIA 06/02/2007  . VAGINITIS, ATROPHIC, POSTMENOPAUSAL 12/06/2008  . VERTIGO 02/16/2009    Past Surgical History:  Procedure Laterality Date  . APPENDECTOMY  1971  . CARDIAC CATHETERIZATION N/A 12/15/2015   Procedure: Left Heart Cath and Coronary Angiography;  Surgeon: Lyn Records, MD;  Location: [facility] INVASIVE CV LAB;  Service: Cardiovascular;  Laterality: N/A;  . CARDIAC CATHETERIZATION N/A 12/15/2015   Procedure: Coronary Stent Intervention;  Surgeon: Lyn Records, MD;  Location: [facility] INVASIVE CV LAB;  Service: Cardiovascular;  Laterality: N/A;  . COLONOSCOPY N/A 12/20/2015   Procedure: COLONOSCOPY;  Surgeon: Hilarie Fredrickson, MD;  Location: [facility] ENDOSCOPY;  Service: Endoscopy;  Laterality: N/A;  . EYE SURGERY Bilateral    "laser tx for glaucoma"  . HEMORRHOID SURGERY  ~ 1968  . SHOULDER ARTHROSCOPY W/ ROTATOR CUFF  REPAIR Right 11/2014  . TOTAL ABDOMINAL HYSTERECTOMY  1971    There were no vitals filed for this visit.    Subjective Assessment - 01/03/21 0930    Subjective Pt reports chronic LBP with R sided radiating pain present for years and worsening intermittently. Pt states that she has difficulty identifying aggravating factors. Prolonged standing does seem to aggravate pain. Pt states that yoga stretches for LB help to alleviate the pain. Pt denies N/T in B feet. Pt reports unable to take pain medicine or pain relievers d/t side effect of nausea; has relied on stretching and heat over the years for back pain. Does note that long car rides seems to irritate her back. Pt walks/does cardio at least 3x/week for heart health d/t hx of heart attack. States able to walk upwards of 2 miles. Pt would like to learn stretches/exercises that will help prevent worsening or recurrence of LBP.    Pertinent History osteopenia, hx of MI, HTN    Limitations Standing    Diagnostic tests xrays    Patient Stated Goals reduce pain and prevent recurrence    Currently in Pain? Yes    Pain Score 4     Pain Location Back    Pain Orientation Right    Pain Descriptors / Indicators Dull;Aching;Sharp    Pain Type Chronic pain    Pain Radiating Towards posterior RLE    Pain  Onset More than a month ago    Pain Frequency Intermittent    Aggravating Factors  prolonged standing    Pain Relieving Factors stretches, rest              [facility] PT Assessment - 01/03/21 0001      Assessment   Medical Diagnosis LBP with RLE radiating pain    Referring Provider (PT) Cirigliano    Hand Dominance Right      Precautions   Precautions None      Restrictions   Weight Bearing Restrictions No      Balance Screen   Has the patient fallen in the past 6 months No    Has the patient had a decrease in activity level because of a fear of falling?  No    Is the patient reluctant to leave their home because of a fear of falling?  No       Home Environment   Additional Comments reports no trouble with stairs      Prior Function   Level of Independence Independent    Vocation Retired    Leisure bridge, gardening, cardio workouts for heart health      Sensation   Light Touch Appears Intact      Functional Tests   Functional tests Sit to Stand      Sit to Stand   Comments [facility]      Posture/Postural Control   Posture/Postural Control Postural limitations    Postural Limitations Rounded Shoulders;Forward head      ROM / Strength   AROM / PROM / Strength AROM;Strength      AROM   AROM Assessment Site Lumbar    Lumbar Flexion 25% limited d/t HS    Lumbar Extension 25% limited    Lumbar - Right Side Bend 25% limited    Lumbar - Left Side Bend 25% limited    Lumbar - Right Rotation WFL    Lumbar - Left Rotation WFL      Strength   Overall Strength Comments 5/5 BLE except 4+/5 hip abd/ext      Flexibility   Soft Tissue Assessment /Muscle Length yes    Hamstrings tight R>L    ITB tight    Piriformis tight      Palpation   Palpation comment tenderness to palpation R glute med/piriformis      Special Tests   Other special tests SLR 65 deg RLE, 80 deg LLE      Transfers   Five time sit to stand comments  <12 sec                      Objective measurements completed on examination: See above findings.       [facility] Adult PT Treatment/Exercise - 01/03/21 0001      Exercises   Exercises Lumbar      Lumbar Exercises: Stretches   Active Hamstring Stretch Right;Left;1 rep;20 seconds    Lower Trunk Rotation 5 reps;10 seconds    Piriformis Stretch Right;Left;2 reps;20 seconds    Piriformis Stretch Limitations seated and supine                  PT Education - 01/03/21 1002    Education Details Pt educated on POC and HEP    Person(s) Educated Patient    Methods Explanation;Demonstration;Handout    Comprehension Verbalized understanding;Returned demonstration            PT  [place] Term Goals -  01/03/21 1011      PT Schnee TERM GOAL #1   Title independent with HEP    Time 2    Period Weeks    Status New    Target Date 01/17/21             PT Long Term Goals - 01/03/21 1011      PT LONG TERM GOAL #1   Title independent with advanced HEP    Time 6    Period Weeks    Status New    Target Date 02/14/21      PT LONG TERM GOAL #2   Title Pt will report resolution of RLE radiating pain    Time 6    Period Weeks    Status New    Target Date 02/14/21      PT LONG TERM GOAL #3   Title Pt will demo RLE SLR equivalent to LLE    Baseline RLE: 65 deg    Time 6    Period Weeks    Status New    Target Date 02/14/21                  Plan - 01/03/21 1007    Clinical Impression Statement Pt presents to clinic with reports of chronic LBP with RLE radiating pain presents for years and worsening intermittently. Prolonged standing is primary aggravating factor. Pt demos mild limitations in lumbar flexion d/t HS tightness R>L, + SLR RLE, tightness B hip IR/ER R>L, and reports relief with piriformis stretching. Pt is active as PLOF. Pt would benefit from skilled PT for increase in B hip flexibility, core/lumbar stab, and RLE flexibility.    Personal Factors and Comorbidities Comorbidity 2    Comorbidities osteopenia, HTN    Examination-Activity Limitations Stand    Examination-Participation Restrictions Community Activity;Interpersonal Relationship    Stability/Clinical Decision Making Stable/Uncomplicated    Clinical Decision Making Low    Rehab Potential Good    PT Frequency 2x / week    PT Duration 6 weeks    PT Treatment/Interventions ADLs/Self Care Home Management;Electrical Stimulation;Iontophoresis 4mg /ml Dexamethasone;Moist Heat;Neuromuscular re-education;Therapeutic exercise;Therapeutic activities;Gait training;Patient/family education;Manual techniques;Passive range of motion    PT Next Visit Plan gentle progression to TE, review/progress HEP,  core/lumbar stab, piriformis stretching, hip flexibility    PT Home Exercise Plan see pt instructions    Consulted and Agree with Plan of Care Patient           Patient will benefit from skilled therapeutic intervention in order to improve the following deficits and impairments:  Decreased range of motion,Increased muscle spasms,Pain,Hypomobility,Impaired flexibility,Decreased strength  Visit Diagnosis: Chronic right-sided low back pain with right-sided sciatica  Muscle weakness (generalized)  Cramp and spasm     Problem List Patient Active Problem List   Diagnosis Date Noted  . Diverticulosis of colon without hemorrhage   . GI bleeding 12/19/2015  . CAD S/P PCI 12/15/15 12/17/2015  . Coronary artery disease involving native coronary artery of native heart with angina pectoris (HCC) 12/17/2015  . ST elevation (STEMI) myocardial infarction involving other coronary artery of inferior wall (HCC) 12/15/2015  . Hyperlipidemia 12/15/2015  . VERTIGO 02/16/2009  . VAGINITIS, ATROPHIC, POSTMENOPAUSAL 12/06/2008  . Allergic rhinitis 06/02/2007  . Disorder of bone and cartilage 06/02/2007   06/04/2007, PT, DPT Lysle Rubens Reace Breshears 01/03/2021, 10:13 AM  [facility] [address] [place], [place], [place] Phone: [phone]   Fax:  [phone]  Name: Nancy  KEYATTA Neal MRN: [id number] Date of Birth: 1932-07-02

## 2021-01-03 NOTE — Patient Instructions (Signed)
Access Code: [email or web address] URL: [email or web address] Date: 01/03/2021 Prepared by: Lysle Rubens  Exercises Supine Lower Trunk Rotation - 1 x daily - 7 x weekly - 2 sets - 10 reps - 5 sec hold Supine Piriformis Stretch with Leg Straight - 1 x daily - 7 x weekly - 2 sets - 2 reps - 15-20 sec hold Seated Table Hamstring Stretch - 1 x daily - 7 x weekly - 2 sets - 2 reps - 15-20 sec hold Seated Piriformis Stretch - 1 x daily - 7 x weekly - 2 sets - 2 reps - 15-20 sec hold

## 2021-01-15 ENCOUNTER — Encounter: Payer: Self-pay | Admitting: Physical Therapy

## 2021-01-15 ENCOUNTER — Encounter: Payer: Federal, State, Local not specified - PPO | Admitting: Physical Therapy

## 2021-01-15 ENCOUNTER — Ambulatory Visit: Payer: Medicare Other | Admitting: Physical Therapy

## 2021-01-15 ENCOUNTER — Other Ambulatory Visit: Payer: Self-pay

## 2021-01-15 DIAGNOSIS — G8929 Other chronic pain: Secondary | ICD-10-CM

## 2021-01-15 DIAGNOSIS — R252 Cramp and spasm: Secondary | ICD-10-CM

## 2021-01-15 DIAGNOSIS — M5441 Lumbago with sciatica, right side: Secondary | ICD-10-CM | POA: Diagnosis not present

## 2021-01-15 DIAGNOSIS — M6281 Muscle weakness (generalized): Secondary | ICD-10-CM

## 2021-01-15 NOTE — Therapy (Signed)
[facility] [place] [facility] [address] [place], [place], [address] Phone: [phone]   Fax:  [phone]  Physical Therapy Treatment  Patient Details  Name: Nancy Neal MRN: [id number] Date of Birth: 07-17-1932 Referring Provider (PT): Cirigliano   Encounter Date: 01/15/2021   PT End of Session - 01/15/21 1441    Visit Number 2    Date for PT Re-Evaluation 03/05/21    PT Start Time 1312    PT Stop Time 1357    PT Time Calculation (min) 45 min    Activity Tolerance Patient tolerated treatment well    Behavior During Therapy [facility] for tasks assessed/performed           Past Medical History:  Diagnosis Date  . ALLERGIC RHINITIS 06/02/2007  . Arthritis    "maybe in my right knee" (12/19/2015)  . Coronary artery disease 11/2015   a. 11/2015: Inferior STEMI w/ Promus DES to RCA.    . Diverticulosis of colon 2003   by 2003 and 2008 colonoscopy.   Marland Kitchen GERD (gastroesophageal reflux disease)   . Glaucoma 1-12   open angle  . History of blood transfusion 1971   "when I had hysterectomy"  . Myocardial infarction (HCC) 11/2015  . OSTEOPENIA 06/02/2007  . VAGINITIS, ATROPHIC, POSTMENOPAUSAL 12/06/2008  . VERTIGO 02/16/2009    Past Surgical History:  Procedure Laterality Date  . APPENDECTOMY  1971  . CARDIAC CATHETERIZATION N/A 12/15/2015   Procedure: Left Heart Cath and Coronary Angiography;  Surgeon: Lyn Records, MD;  Location: [facility] INVASIVE CV LAB;  Service: Cardiovascular;  Laterality: N/A;  . CARDIAC CATHETERIZATION N/A 12/15/2015   Procedure: Coronary Stent Intervention;  Surgeon: Lyn Records, MD;  Location: [facility] INVASIVE CV LAB;  Service: Cardiovascular;  Laterality: N/A;  . COLONOSCOPY N/A 12/20/2015   Procedure: COLONOSCOPY;  Surgeon: Hilarie Fredrickson, MD;  Location: [facility] ENDOSCOPY;  Service: Endoscopy;  Laterality: N/A;  . EYE SURGERY Bilateral    "laser tx for glaucoma"  . HEMORRHOID SURGERY  ~ 1968  . SHOULDER ARTHROSCOPY W/ ROTATOR CUFF  REPAIR Right 11/2014  . TOTAL ABDOMINAL HYSTERECTOMY  1971    There were no vitals filed for this visit.   Subjective Assessment - 01/15/21 1319    Subjective Pt reports that piriformis stretch seated has been the most helpful so far. States that pain comes and goes intermittently.    Currently in Pain? No/denies    Pain Score 0-No pain    Pain Location Back    Pain Orientation Right                             [facility] Adult PT Treatment/Exercise - 01/15/21 0001      Lumbar Exercises: Stretches   Passive Hamstring Stretch 3 reps;Right;20 seconds    Single Knee to Chest Stretch Right;3 reps;20 seconds    ITB Stretch Right;3 reps;20 seconds    Piriformis Stretch Right;3 reps;20 seconds    Gastroc Stretch Right;Left;1 rep;20 seconds      Lumbar Exercises: Aerobic   Nustep L5 x 6 min      Lumbar Exercises: Machines for Strengthening   Other Lumbar Machine Exercise 20# rows and lats 2x10      Lumbar Exercises: Standing   Heel Raises 10 reps    Heel Raises Limitations 3#    Other Standing Lumbar Exercises standing hip abd/flex/ext/knee flex 3# weights 1x10      Lumbar Exercises:  Seated   Long Arc Quad on Chair Both;1 set;10 reps    LAQ on Chair Weights (lbs) 3    Sit to Stand 20 reps   2x10 with 2# dumbbell chest press   Other Seated Lumbar Exercises seated marches 3# 1x10      Lumbar Exercises: Supine   Other Supine Lumbar Exercises supine dktc, ltr, small bridges on pball x15    Other Supine Lumbar Exercises supine clams red tb 2x10 B                    PT Rascon Term Goals - 01/03/21 1011      PT Rosenberry TERM GOAL #1   Title independent with HEP    Time 2    Period Weeks    Status New    Target Date 01/17/21             PT Long Term Goals - 01/03/21 1011      PT LONG TERM GOAL #1   Title independent with advanced HEP    Time 6    Period Weeks    Status New    Target Date 02/14/21      PT LONG TERM GOAL #2   Title Pt will report  resolution of RLE radiating pain    Time 6    Period Weeks    Status New    Target Date 02/14/21      PT LONG TERM GOAL #3   Title Pt will demo RLE SLR equivalent to LLE    Baseline RLE: 65 deg    Time 6    Period Weeks    Status New    Target Date 02/14/21                 Plan - 01/15/21 1442    Clinical Impression Statement Pt tolerated progression to TE well with no report of increased LBP or radiating pain with exercise. Posture/form correction for seated rows. Pt reports relief with hamstring/piriformis stretching. Continue to progress lumbar/core stab and RLE flexibility ex's.    PT Treatment/Interventions ADLs/Self Care Home Management;Electrical Stimulation;Iontophoresis 4mg /ml Dexamethasone;Moist Heat;Neuromuscular re-education;Therapeutic exercise;Therapeutic activities;Gait training;Patient/family education;Manual techniques;Passive range of motion    PT Next Visit Plan gentle progression of TE, progress HEP, core/lumbar stab, piriformis stretching, hip flexibility    Consulted and Agree with Plan of Care Patient           Patient will benefit from skilled therapeutic intervention in order to improve the following deficits and impairments:  Decreased range of motion,Increased muscle spasms,Pain,Hypomobility,Impaired flexibility,Decreased strength  Visit Diagnosis: Chronic right-sided low back pain with right-sided sciatica  Muscle weakness (generalized)  Cramp and spasm     Problem List Patient Active Problem List   Diagnosis Date Noted  . Diverticulosis of colon without hemorrhage   . GI bleeding 12/19/2015  . CAD S/P PCI 12/15/15 12/17/2015  . Coronary artery disease involving native coronary artery of native heart with angina pectoris (HCC) 12/17/2015  . ST elevation (STEMI) myocardial infarction involving other coronary artery of inferior wall (HCC) 12/15/2015  . Hyperlipidemia 12/15/2015  . VERTIGO 02/16/2009  . VAGINITIS, ATROPHIC, POSTMENOPAUSAL  12/06/2008  . Allergic rhinitis 06/02/2007  . Disorder of bone and cartilage 06/02/2007   06/04/2007, PT, DPT Lysle Rubens Clotilda Hafer 01/15/2021, 2:44 PM  [facility] [place] [facility] [address] [place], [place], [place] Phone: [phone]   Fax:  [phone]  Name: NICKI FURLAN MRN: [name]  Date of Birth: [birth year]

## 2021-01-17 ENCOUNTER — Ambulatory Visit: Payer: Medicare Other | Admitting: Physical Therapy

## 2021-01-17 ENCOUNTER — Encounter: Payer: Self-pay | Admitting: Physical Therapy

## 2021-01-17 ENCOUNTER — Other Ambulatory Visit: Payer: Self-pay

## 2021-01-17 DIAGNOSIS — M6281 Muscle weakness (generalized): Secondary | ICD-10-CM

## 2021-01-17 DIAGNOSIS — R252 Cramp and spasm: Secondary | ICD-10-CM

## 2021-01-17 DIAGNOSIS — G8929 Other chronic pain: Secondary | ICD-10-CM

## 2021-01-17 DIAGNOSIS — M5441 Lumbago with sciatica, right side: Secondary | ICD-10-CM | POA: Diagnosis not present

## 2021-01-17 NOTE — Therapy (Signed)
[facility] [place] [facility] [address] [place], [place], [address] Phone: [phone]   Fax:  [phone]  Physical Therapy Treatment  Patient Details  Name: Nancy Neal MRN: [id number] Date of Birth: 08/14/1932 Referring Provider (PT): Cirigliano   Encounter Date: 01/17/2021   PT End of Session - 01/17/21 1017    Visit Number 3    Date for PT Re-Evaluation 03/05/21    PT Start Time 0929    PT Stop Time 1013    PT Time Calculation (min) 44 min    Activity Tolerance Patient tolerated treatment well    Behavior During Therapy [facility] for tasks assessed/performed           Past Medical History:  Diagnosis Date  . ALLERGIC RHINITIS 06/02/2007  . Arthritis    "maybe in my right knee" (12/19/2015)  . Coronary artery disease 11/2015   a. 11/2015: Inferior STEMI w/ Promus DES to RCA.    . Diverticulosis of colon 2003   by 2003 and 2008 colonoscopy.   Marland Kitchen GERD (gastroesophageal reflux disease)   . Glaucoma 1-12   open angle  . History of blood transfusion 1971   "when I had hysterectomy"  . Myocardial infarction (HCC) 11/2015  . OSTEOPENIA 06/02/2007  . VAGINITIS, ATROPHIC, POSTMENOPAUSAL 12/06/2008  . VERTIGO 02/16/2009    Past Surgical History:  Procedure Laterality Date  . APPENDECTOMY  1971  . CARDIAC CATHETERIZATION N/A 12/15/2015   Procedure: Left Heart Cath and Coronary Angiography;  Surgeon: Lyn Records, MD;  Location: [facility] INVASIVE CV LAB;  Service: Cardiovascular;  Laterality: N/A;  . CARDIAC CATHETERIZATION N/A 12/15/2015   Procedure: Coronary Stent Intervention;  Surgeon: Lyn Records, MD;  Location: [facility] INVASIVE CV LAB;  Service: Cardiovascular;  Laterality: N/A;  . COLONOSCOPY N/A 12/20/2015   Procedure: COLONOSCOPY;  Surgeon: Hilarie Fredrickson, MD;  Location: [facility] ENDOSCOPY;  Service: Endoscopy;  Laterality: N/A;  . EYE SURGERY Bilateral    "laser tx for glaucoma"  . HEMORRHOID SURGERY  ~ 1968  . SHOULDER ARTHROSCOPY W/ ROTATOR CUFF  REPAIR Right 11/2014  . TOTAL ABDOMINAL HYSTERECTOMY  1971    There were no vitals filed for this visit.   Subjective Assessment - 01/17/21 0934    Subjective Pt reports she felt good after last workout; had a little bit of radiating pain post but resolved within 24 hours.    Currently in Pain? No/denies    Pain Score 0-No pain    Pain Location Back                             [facility] Adult PT Treatment/Exercise - 01/17/21 0001      Lumbar Exercises: Aerobic   Recumbent Bike L 5.1 x 7 min      Lumbar Exercises: Machines for Strengthening   Cybex Knee Extension 5# 2x10 BLE    Cybex Knee Flexion 25# 2x10 BLE    Other Lumbar Machine Exercise 20# rows and lats 2x10      Lumbar Exercises: Standing   Heel Raises 10 reps    Heel Raises Limitations 3#    Other Standing Lumbar Exercises standing hip abd/flex/ext/knee flex 3# weights 1x10      Lumbar Exercises: Seated   Sit to Stand 20 reps   2x10 with 3# dumbbell chest press                   PT Kalman  Term Goals - 01/03/21 1011      PT Cavazos TERM GOAL #1   Title independent with HEP    Time 2    Period Weeks    Status New    Target Date 01/17/21             PT Long Term Goals - 01/03/21 1011      PT LONG TERM GOAL #1   Title independent with advanced HEP    Time 6    Period Weeks    Status New    Target Date 02/14/21      PT LONG TERM GOAL #2   Title Pt will report resolution of RLE radiating pain    Time 6    Period Weeks    Status New    Target Date 02/14/21      PT LONG TERM GOAL #3   Title Pt will demo RLE SLR equivalent to LLE    Baseline RLE: 65 deg    Time 6    Period Weeks    Status New    Target Date 02/14/21                 Plan - 01/17/21 1018    Clinical Impression Statement Pt tolerated progression of TE well, no reports of increased pain with exercise. No pain with seated knee flexion/extension machine. Occasional LOB with resisted gait requiring CGA esp  with bkwd walking. Reports relief with piriformis stretching. Prescribed additional LE/hip strengthening ex's with pt demo understanding. Continue to progress lumbar/core stab and RLE flexibility ex's.    PT Treatment/Interventions ADLs/Self Care Home Management;Electrical Stimulation;Iontophoresis 4mg /ml Dexamethasone;Moist Heat;Neuromuscular re-education;Therapeutic exercise;Therapeutic activities;Gait training;Patient/family education;Manual techniques;Passive range of motion    PT Next Visit Plan gentle progression of TE, progress HEP, core/lumbar stab, piriformis stretching, hip flexibility    Consulted and Agree with Plan of Care Patient           Patient will benefit from skilled therapeutic intervention in order to improve the following deficits and impairments:  Decreased range of motion,Increased muscle spasms,Pain,Hypomobility,Impaired flexibility,Decreased strength  Visit Diagnosis: Chronic right-sided low back pain with right-sided sciatica  Muscle weakness (generalized)  Cramp and spasm     Problem List Patient Active Problem List   Diagnosis Date Noted  . Diverticulosis of colon without hemorrhage   . GI bleeding 12/19/2015  . CAD S/P PCI 12/15/15 12/17/2015  . Coronary artery disease involving native coronary artery of native heart with angina pectoris (HCC) 12/17/2015  . ST elevation (STEMI) myocardial infarction involving other coronary artery of inferior wall (HCC) 12/15/2015  . Hyperlipidemia 12/15/2015  . VERTIGO 02/16/2009  . VAGINITIS, ATROPHIC, POSTMENOPAUSAL 12/06/2008  . Allergic rhinitis 06/02/2007  . Disorder of bone and cartilage 06/02/2007   06/04/2007, PT, DPT Lysle Rubens Aneita Kiger 01/17/2021, 10:20 AM  [facility] [place] [facility] [address] [place], [place], [place] Phone: [phone]   Fax:  [phone]  Name: Nancy Neal MRN: Nancy Neal Date of Birth: 12/06/1931

## 2021-01-17 NOTE — Progress Notes (Signed)
Cardiology Office Note:    Date:  01/18/2021   ID:  Nancy Neal, DOB 1931-11-28, MRN [id number]  PCP:  Overton Mam, DO  Cardiologist:  [name], MD   Referring MD: Overton Mam, DO   Chief Complaint  Patient presents with  . Coronary Artery Disease  . Hyperlipidemia    History of Present Illness:    Nancy Neal is a 87 y.o. female with a hx of acute inferior MI RCA DES February 2017 and documentation of residual 60-70% mid LAD, normal residual LV function, andhyperlipidemia.  She feels that statins are causing her to have intermittent chest discomfort.  It comes and goes.  It is a tenderness in the left parasternal area.  Was much more severe right after her MI in 2017 when we had her on high intensity statin therapy.  She is now only on Lipitor 10 mg/day.  The discomfort does not terrible and she has decided that she would prefer to stay on the medication rather than have a dose reduction or a statin free I will.  He is staying active.  No exertional discomfort.  She is planning on a [place] cruise starting in [place].  She is going with her sister.  She looks forward to it.  Past Medical History:  Diagnosis Date  . ALLERGIC RHINITIS 06/02/2007  . Arthritis    "maybe in my right knee" (12/19/2015)  . Coronary artery disease 11/2015   a. 11/2015: Inferior STEMI w/ Promus DES to RCA.    . Diverticulosis of colon 2003   by 2003 and 2008 colonoscopy.   Marland Kitchen GERD (gastroesophageal reflux disease)   . Glaucoma 1-12   open angle  . History of blood transfusion 1971   "when I had hysterectomy"  . Myocardial infarction (HCC) 11/2015  . OSTEOPENIA 06/02/2007  . VAGINITIS, ATROPHIC, POSTMENOPAUSAL 12/06/2008  . VERTIGO 02/16/2009    Past Surgical History:  Procedure Laterality Date  . APPENDECTOMY  1971  . CARDIAC CATHETERIZATION N/A 12/15/2015   Procedure: Left Heart Cath and Coronary Angiography;  Surgeon: Lyn Records, MD;  Location: [facility] INVASIVE CV LAB;   Service: Cardiovascular;  Laterality: N/A;  . CARDIAC CATHETERIZATION N/A 12/15/2015   Procedure: Coronary Stent Intervention;  Surgeon: Lyn Records, MD;  Location: [facility] INVASIVE CV LAB;  Service: Cardiovascular;  Laterality: N/A;  . COLONOSCOPY N/A 12/20/2015   Procedure: COLONOSCOPY;  Surgeon: Hilarie Fredrickson, MD;  Location: [facility] ENDOSCOPY;  Service: Endoscopy;  Laterality: N/A;  . EYE SURGERY Bilateral    "laser tx for glaucoma"  . HEMORRHOID SURGERY  ~ 1968  . SHOULDER ARTHROSCOPY W/ ROTATOR CUFF REPAIR Right 11/2014  . TOTAL ABDOMINAL HYSTERECTOMY  1971    Current Medications: Current Meds  Medication Sig  . acetaminophen (TYLENOL) 325 MG tablet Take 2 tablets (650 mg total) by mouth every 4 (four) hours as needed for headache or mild pain.  . Ascorbic Acid (VITAMIN C ER PO) Take 1 tablet by mouth daily.  Marland Kitchen aspirin 81 MG chewable tablet Chew 1 tablet (81 mg total) by mouth daily.  Marland Kitchen atorvastatin (LIPITOR) 10 MG tablet Take 1 tablet (10 mg total) by mouth daily.  . carboxymethylcellulose (REFRESH PLUS) 0.5 % SOLN Place 1 drop into both eyes 3 (three) times daily as needed (dry eyes).   . Cyanocobalamin (VITAMIN B-12 PO) Take 1 tablet by mouth daily.  Marland Kitchen MAGNESIUM PO Take by mouth.  . Multiple Vitamin (MULTIVITAMIN WITH MINERALS) TABS tablet Take  1 tablet by mouth daily.  . naproxen (NAPROSYN) 500 MG tablet Take 1 tablet (500 mg total) by mouth 2 (two) times daily with a meal.  . nitroGLYCERIN (NITROSTAT) 0.4 MG SL tablet PLACE 1 TABLET UNDER THE TONGUE EVERY 5 MINUTES AS NEEDED FOR CHEST PAIN  . OVER THE COUNTER MEDICATION Take 1 tablet by mouth daily. Over the counter antihistamine  . VITAMIN E PO Take 1 capsule by mouth daily.      Allergies:   Codeine sulfate, Meclizine, Acetaminophen, Morphine, and Tramadol   Social History   Socioeconomic History  . Marital status: Widowed    Spouse name: Not on file  . Number of children: Not on file  . Years of education: Not on file  . Highest  education level: Not on file  Occupational History  . Not on file  Tobacco Use  . Smoking status: Never Smoker  . Smokeless tobacco: Never Used  Vaping Use  . Vaping Use: Never used  Substance and Sexual Activity  . Alcohol use: Yes    Alcohol/week: 7.0 standard drinks    Types: 7 Glasses of wine per week  . Drug use: No  . Sexual activity: Never    Birth control/protection: Surgical  Other Topics Concern  . Not on file  Social History Narrative  . Not on file   Social Determinants of Health   Financial Resource Strain: Not on file  Food Insecurity: Not on file  Transportation Needs: Not on file  Physical Activity: Not on file  Stress: Not on file  Social Connections: Not on file     Family History: The patient's family history includes Arthritis in her brother; Heart disease in her father; Suicidality in her mother. There is no history of Breast cancer.  ROS:   Please see the history of present illness.    She has occasional vertigo.  She feels atorvastatin is causing side effects.  She has sciatica involving her right leg.  Otherwise no complaints.  All other systems reviewed and are negative.  EKGs/Labs/Other Studies Reviewed:    The following studies were reviewed today: No new imaging data  EKG:  EKG normal sinus rhythm with normal appearing EKG unchanged from prior tracing in 2021  Recent Labs: No results found for requested labs within last 8760 hours.  Recent Lipid Panel    Component Value Date/Time   CHOL 128 01/02/2020 1030   TRIG 76 01/02/2020 1030   HDL 66 01/02/2020 1030   CHOLHDL 1.9 01/02/2020 1030   CHOLHDL 2.3 08/05/2016 0747   VLDL 15 08/05/2016 0747   LDLCALC 47 01/02/2020 1030   LDLDIRECT 123.2 12/18/2011 0954    Physical Exam:    VS:  BP 116/62   Pulse 76   Ht 5\' 4"  (1.626 m)   Wt 132 lb 6.4 oz (60.1 kg)   SpO2 90%   BMI 22.73 kg/m     Wt Readings from Last 3 Encounters:  01/18/21 132 lb 6.4 oz (60.1 kg)  07/04/20 134 lb 3.2  oz (60.9 kg)  05/25/20 136 lb (61.7 kg)     GEN: Slender and age-appropriate in appearance.  Not frail.. No acute distress HEENT: Normal NECK: No JVD. LYMPHATICS: No lymphadenopathy CARDIAC: No murmur. RRR no gallop, or edema. VASCULAR:  Normal Pulses. No bruits. RESPIRATORY:  Clear to auscultation without rales, wheezing or rhonchi  ABDOMEN: Soft, non-tender, non-distended, No pulsatile mass, MUSCULOSKELETAL: No deformity  SKIN: Warm and dry NEUROLOGIC:  Alert and oriented x 3  PSYCHIATRIC:  Normal affect   ASSESSMENT:    1. Coronary artery disease involving native coronary artery of native heart with angina pectoris (HCC)   2. Pure hypercholesterolemia   3. CAD S/P PCI 12/15/15   4. Gastrointestinal hemorrhage, unspecified gastrointestinal hemorrhage type    PLAN:    In order of problems listed above:  1. There prevention discussed.  Her major risk factor is the pits.  She is encouraged to remain on statin therapy. 2. See above. 3. Secondary prevention is discussed. 4. Did not discuss.  Overall education and awareness concerning primary/secondary risk prevention was discussed in detail: LDL less than 70, hemoglobin A1c less than 7, blood pressure target less than 130/80 mmHg, >150 minutes of moderate aerobic activity per week, avoidance of smoking, weight control (via diet and exercise), and continued surveillance/management of/for obstructive sleep apnea.    Medication Adjustments/Labs and Tests Ordered: Current medicines are reviewed at length with the patient today.  Concerns regarding medicines are outlined above.  Orders Placed This Encounter  Procedures  . EKG 12-Lead   No orders of the defined types were placed in this encounter.   Patient Instructions  Medication Instructions:  Your physician recommends that you continue on your current medications as directed. Please refer to the Current Medication list given to you today.  *If you need a refill on your  cardiac medications before your next appointment, please call your pharmacy*   Lab Work: None If you have labs (blood work) drawn today and your tests are completely normal, you will receive your results only by: Marland Kitchen MyChart Message (if you have MyChart) OR . A paper copy in the mail If you have any lab test that is abnormal or we need to change your treatment, we will call you to review the results.   Testing/Procedures: None   Follow-Up: At [facility], you and your health needs are our priority.  As part of our continuing mission to provide you with exceptional heart care, we have created designated Provider Care Teams.  These Care Teams include your primary Cardiologist (physician) and Advanced Practice Providers (APPs -  Physician Assistants and Nurse Practitioners) who all work together to provide you with the care you need, when you need it.  We recommend signing up for the patient portal called "MyChart".  Sign up information is provided on this After Visit Summary.  MyChart is used to connect with patients for Virtual Visits (Telemedicine).  Patients are able to view lab/test results, encounter notes, upcoming appointments, etc.  Non-urgent messages can be sent to your provider as well.   To learn more about what you can do with MyChart, go to [email or web address].    Your next appointment:   1 year(s)  The format for your next appointment:   In Person  Provider:   You may see [name], MD or one of the following Advanced Practice Providers on your designated Care Team:    Georgie Chard, NP    Other Instructions      Signed, [name], MD  01/18/2021 3:47 PM    [facility]

## 2021-01-18 ENCOUNTER — Ambulatory Visit (INDEPENDENT_AMBULATORY_CARE_PROVIDER_SITE_OTHER): Payer: Medicare Other | Admitting: Interventional Cardiology

## 2021-01-18 ENCOUNTER — Encounter: Payer: Self-pay | Admitting: Interventional Cardiology

## 2021-01-18 VITALS — BP 116/62 | HR 76 | Ht 64.0 in | Wt 132.4 lb

## 2021-01-18 DIAGNOSIS — Z9861 Coronary angioplasty status: Secondary | ICD-10-CM | POA: Diagnosis not present

## 2021-01-18 DIAGNOSIS — E7800 Pure hypercholesterolemia, unspecified: Secondary | ICD-10-CM | POA: Diagnosis not present

## 2021-01-18 DIAGNOSIS — I25119 Atherosclerotic heart disease of native coronary artery with unspecified angina pectoris: Secondary | ICD-10-CM

## 2021-01-18 DIAGNOSIS — I2510 Atherosclerotic heart disease of native coronary artery without angina pectoris: Secondary | ICD-10-CM

## 2021-01-18 DIAGNOSIS — K922 Gastrointestinal hemorrhage, unspecified: Secondary | ICD-10-CM

## 2021-01-18 NOTE — Patient Instructions (Signed)

## 2021-01-22 ENCOUNTER — Other Ambulatory Visit: Payer: Self-pay

## 2021-01-22 ENCOUNTER — Encounter: Payer: Self-pay | Admitting: Physical Therapy

## 2021-01-22 ENCOUNTER — Ambulatory Visit: Payer: Medicare Other | Attending: Specialist | Admitting: Physical Therapy

## 2021-01-22 DIAGNOSIS — M5441 Lumbago with sciatica, right side: Secondary | ICD-10-CM | POA: Insufficient documentation

## 2021-01-22 DIAGNOSIS — R252 Cramp and spasm: Secondary | ICD-10-CM | POA: Diagnosis not present

## 2021-01-22 DIAGNOSIS — G8929 Other chronic pain: Secondary | ICD-10-CM

## 2021-01-22 DIAGNOSIS — M6281 Muscle weakness (generalized): Secondary | ICD-10-CM | POA: Diagnosis not present

## 2021-01-22 NOTE — Therapy (Signed)
[facility] [address] [place], [place], [address] Phone: [phone]   Fax:  [phone]  Physical Therapy Treatment  Patient Details  Name: Nancy Neal MRN: [id number] Date of Birth: [birth year] Referring Provider (PT): Cirigliano   Encounter Date: 01/22/2021   PT End of Session - 01/22/21 1145    Visit Number 4    Date for PT Re-Evaluation 03/05/21    PT Start Time 1102    PT Stop Time 1143    PT Time Calculation (min) 41 min    Activity Tolerance Patient tolerated treatment well    Behavior During Therapy [facility] for tasks assessed/performed           Past Medical History:  Diagnosis Date  . ALLERGIC RHINITIS 06/02/2007  . Arthritis    "maybe in my right knee" (12/19/2015)  . Coronary artery disease 11/2015   a. 11/2015: Inferior STEMI w/ Promus DES to RCA.    . Diverticulosis of colon 2003   by 2003 and 2008 colonoscopy.   Marland Kitchen GERD (gastroesophageal reflux disease)   . Glaucoma 1-12   open angle  . History of blood transfusion 1971   "when I had hysterectomy"  . Myocardial infarction (HCC) 11/2015  . OSTEOPENIA 06/02/2007  . VAGINITIS, ATROPHIC, POSTMENOPAUSAL 12/06/2008  . VERTIGO 02/16/2009    Past Surgical History:  Procedure Laterality Date  . APPENDECTOMY  1971  . CARDIAC CATHETERIZATION N/A 12/15/2015   Procedure: Left Heart Cath and Coronary Angiography;  Surgeon: Lyn Records, MD;  Location: [facility] INVASIVE CV LAB;  Service: Cardiovascular;  Laterality: N/A;  . CARDIAC CATHETERIZATION N/A 12/15/2015   Procedure: Coronary Stent Intervention;  Surgeon: Lyn Records, MD;  Location: [facility] INVASIVE CV LAB;  Service: Cardiovascular;  Laterality: N/A;  . COLONOSCOPY N/A 12/20/2015   Procedure: COLONOSCOPY;  Surgeon: Hilarie Fredrickson, MD;  Location: [facility] ENDOSCOPY;  Service: Endoscopy;  Laterality: N/A;  . EYE SURGERY Bilateral    "laser tx for glaucoma"  . HEMORRHOID SURGERY  ~ 1968  . SHOULDER ARTHROSCOPY W/ ROTATOR CUFF  REPAIR Right 11/2014  . TOTAL ABDOMINAL HYSTERECTOMY  1971    There were no vitals filed for this visit.   Subjective Assessment - 01/22/21 1111    Subjective Pt reports RLE was extra sore after last rx, states better now    Currently in Pain? Yes    Pain Score 2     Pain Location Leg    Pain Orientation Right;Lower;Lateral                             [facility] Adult PT Treatment/Exercise - 01/22/21 0001      Lumbar Exercises: Stretches   Gastroc Stretch Right;Left;1 rep;20 seconds      Lumbar Exercises: Aerobic   Nustep L5 x 6 min      Lumbar Exercises: Machines for Strengthening   Cybex Knee Extension --    Cybex Knee Flexion --   switched to seated HS curls and LAQ d/t pt c/o R lower lat leg pain with machine intervention   Other Lumbar Machine Exercise 20# rows and lats 2x10    Other Lumbar Machine Exercise standing shoulder ext 5# 2x10      Lumbar Exercises: Standing   Other Standing Lumbar Exercises resisted gait 20# x4 each direction      Lumbar Exercises: Seated   Long Arc Quad on Chair Both;10 reps;2 sets  LAQ on Chair Weights (lbs) 5    Sit to Stand 20 reps   with 2# dumbbell chest press   Other Seated Lumbar Exercises seated knee flexion with red TB 2x10                    PT Kostka Term Goals - 01/03/21 1011      PT Grundman TERM GOAL #1   Title independent with HEP    Time 2    Period Weeks    Status New    Target Date 01/17/21             PT Long Term Goals - 01/03/21 1011      PT LONG TERM GOAL #1   Title independent with advanced HEP    Time 6    Period Weeks    Status New    Target Date 02/14/21      PT LONG TERM GOAL #2   Title Pt will report resolution of RLE radiating pain    Time 6    Period Weeks    Status New    Target Date 02/14/21      PT LONG TERM GOAL #3   Title Pt will demo RLE SLR equivalent to LLE    Baseline RLE: 65 deg    Time 6    Period Weeks    Status New    Target Date 02/14/21                  Plan - 01/22/21 1145    Clinical Impression Statement Modified knee ext/flex exercise to seated LAQ and HS curl d/t pt c/o of lateral lower RLE pain. Unclear MOI for this pain but pt reports that it was increasingly sore after last session and getting better since; follow up on this next rx. Pt did well with other ex's with no additional pain reported. Cues for form/posture with standing shoulder extensions. Continue to progress lumbar/core stab and RLE flexibility ex's.    PT Treatment/Interventions ADLs/Self Care Home Management;Electrical Stimulation;Iontophoresis 4mg /ml Dexamethasone;Moist Heat;Neuromuscular re-education;Therapeutic exercise;Therapeutic activities;Gait training;Patient/family education;Manual techniques;Passive range of motion    PT Next Visit Plan gentle progression of TE, progress HEP, core/lumbar stab, piriformis stretching, hip flexibility    Consulted and Agree with Plan of Care Patient           Patient will benefit from skilled therapeutic intervention in order to improve the following deficits and impairments:  Decreased range of motion,Increased muscle spasms,Pain,Hypomobility,Impaired flexibility,Decreased strength  Visit Diagnosis: Chronic right-sided low back pain with right-sided sciatica  Muscle weakness (generalized)  Cramp and spasm     Problem List Patient Active Problem List   Diagnosis Date Noted  . Diverticulosis of colon without hemorrhage   . GI bleeding 12/19/2015  . CAD S/P PCI 12/15/15 12/17/2015  . Coronary artery disease involving native coronary artery of native heart with angina pectoris (HCC) 12/17/2015  . ST elevation (STEMI) myocardial infarction involving other coronary artery of inferior wall (HCC) 12/15/2015  . Hyperlipidemia 12/15/2015  . VERTIGO 02/16/2009  . VAGINITIS, ATROPHIC, POSTMENOPAUSAL 12/06/2008  . Allergic rhinitis 06/02/2007  . Disorder of bone and cartilage 06/02/2007   06/04/2007, PT,  DPT Lysle Rubens Srinidhi Landers 01/22/2021, 11:49 AM  [facility] [address] [place], [place], [place] Phone: [phone]   Fax:  [phone]  Name: Nancy Neal MRN: [name] Date of Birth: Dec 08, 1931

## 2021-01-24 ENCOUNTER — Encounter: Payer: Federal, State, Local not specified - PPO | Admitting: Physical Therapy

## 2021-01-25 ENCOUNTER — Encounter: Payer: Self-pay | Admitting: Physical Therapy

## 2021-01-25 ENCOUNTER — Ambulatory Visit: Payer: Medicare Other | Admitting: Physical Therapy

## 2021-01-25 ENCOUNTER — Other Ambulatory Visit: Payer: Self-pay

## 2021-01-25 DIAGNOSIS — M5441 Lumbago with sciatica, right side: Secondary | ICD-10-CM | POA: Diagnosis not present

## 2021-01-25 DIAGNOSIS — M6281 Muscle weakness (generalized): Secondary | ICD-10-CM

## 2021-01-25 DIAGNOSIS — R252 Cramp and spasm: Secondary | ICD-10-CM

## 2021-01-25 DIAGNOSIS — G8929 Other chronic pain: Secondary | ICD-10-CM

## 2021-01-25 NOTE — Therapy (Signed)
[facility] [address] [place], [place], [address] Phone: [phone]   Fax:  [phone]  Physical Therapy Treatment  Patient Details  Name: Nancy Neal MRN: [id number] Date of Birth: 1932-10-03 Referring Provider (PT): Cirigliano   Encounter Date: 01/25/2021   PT End of Session - 01/25/21 1124    Visit Number 5    Date for PT Re-Evaluation 03/05/21    PT Start Time 1015    PT Stop Time 1057    PT Time Calculation (min) 42 min    Activity Tolerance Patient tolerated treatment well    Behavior During Therapy [facility] for tasks assessed/performed           Past Medical History:  Diagnosis Date  . ALLERGIC RHINITIS 06/02/2007  . Arthritis    "maybe in my right knee" (12/19/2015)  . Coronary artery disease 11/2015   a. 11/2015: Inferior STEMI w/ Promus DES to RCA.    . Diverticulosis of colon 2003   by 2003 and 2008 colonoscopy.   Marland Kitchen GERD (gastroesophageal reflux disease)   . Glaucoma 1-12   open angle  . History of blood transfusion 1971   "when I had hysterectomy"  . Myocardial infarction (HCC) 11/2015  . OSTEOPENIA 06/02/2007  . VAGINITIS, ATROPHIC, POSTMENOPAUSAL 12/06/2008  . VERTIGO 02/16/2009    Past Surgical History:  Procedure Laterality Date  . APPENDECTOMY  1971  . CARDIAC CATHETERIZATION N/A 12/15/2015   Procedure: Left Heart Cath and Coronary Angiography;  Surgeon: Lyn Records, MD;  Location: [facility] INVASIVE CV LAB;  Service: Cardiovascular;  Laterality: N/A;  . CARDIAC CATHETERIZATION N/A 12/15/2015   Procedure: Coronary Stent Intervention;  Surgeon: Lyn Records, MD;  Location: [facility] INVASIVE CV LAB;  Service: Cardiovascular;  Laterality: N/A;  . COLONOSCOPY N/A 12/20/2015   Procedure: COLONOSCOPY;  Surgeon: Hilarie Fredrickson, MD;  Location: [facility] ENDOSCOPY;  Service: Endoscopy;  Laterality: N/A;  . EYE SURGERY Bilateral    "laser tx for glaucoma"  . HEMORRHOID SURGERY  ~ 1968  . SHOULDER ARTHROSCOPY W/ ROTATOR CUFF  REPAIR Right 11/2014  . TOTAL ABDOMINAL HYSTERECTOMY  1971    There were no vitals filed for this visit.   Subjective Assessment - 01/25/21 1020    Subjective Pt states still a little soreness today    Currently in Pain? Yes    Pain Score 3     Pain Location Leg    Pain Orientation Right;Lower;Lateral                             [facility] Adult PT Treatment/Exercise - 01/25/21 0001      Self-Care   Self-Care Other Self-Care Comments    Other Self-Care Comments  education on importance of continuance of HEP along with POC and progress      Lumbar Exercises: Stretches   Gastroc Stretch Right;Left;1 rep;20 seconds      Lumbar Exercises: Aerobic   UBE (Upper Arm Bike) L1.5 x 2 min each    Nustep L5 x 6 min      Lumbar Exercises: Machines for Strengthening   Other Lumbar Machine Exercise 20# rows and lats 2x10    Other Lumbar Machine Exercise standing shoulder ext 5# 2x10      Lumbar Exercises: Standing   Other Standing Lumbar Exercises fwd step ups/alt step taps 6" stair no HR      Lumbar Exercises: Seated   Long [employer]  on Chair Both;10 reps;2 sets    LAQ on Chair Weights (lbs) 5    Sit to Stand 20 reps   3# dumbbell chest press and red TB around knees to prevent knee adduction   Other Seated Lumbar Exercises seated knee flexion with red TB 2x10    Other Seated Lumbar Exercises seated clamshell red TB x15; seated marching 5# 2x10 B                    PT Marbach Term Goals - 01/25/21 1126      PT Branam TERM GOAL #1   Title independent with HEP    Time 2    Period Weeks    Status Achieved    Target Date 01/17/21             PT Long Term Goals - 01/03/21 1011      PT LONG TERM GOAL #1   Title independent with advanced HEP    Time 6    Period Weeks    Status New    Target Date 02/14/21      PT LONG TERM GOAL #2   Title Pt will report resolution of RLE radiating pain    Time 6    Period Weeks    Status New    Target Date 02/14/21       PT LONG TERM GOAL #3   Title Pt will demo RLE SLR equivalent to LLE    Baseline RLE: 65 deg    Time 6    Period Weeks    Status New    Target Date 02/14/21                 Plan - 01/25/21 1124    Clinical Impression Statement Pt reports continued improvement of lateral RLE pain. Tender to palpation R lat distal peroneals. No tenderness posterior calf and neg Homan's. Education today on plan for continuance of HEP, pt progress, along with post discharge goals. Pt did well with all ex's with no c/o of LBP or RLE pain. Cues for form/posture with standing shoulder ext. Continue to progress lumbar/core stab and RLE flexibility ex's.    PT Treatment/Interventions ADLs/Self Care Home Management;Electrical Stimulation;Iontophoresis 4mg /ml Dexamethasone;Moist Heat;Neuromuscular re-education;Therapeutic exercise;Therapeutic activities;Gait training;Patient/family education;Manual techniques;Passive range of motion    PT Next Visit Plan gentle progression of TE, progress HEP, core/lumbar stab, piriformis stretching, hip flexibility    Consulted and Agree with Plan of Care Patient           Patient will benefit from skilled therapeutic intervention in order to improve the following deficits and impairments:  Decreased range of motion,Increased muscle spasms,Pain,Hypomobility,Impaired flexibility,Decreased strength  Visit Diagnosis: Chronic right-sided low back pain with right-sided sciatica  Muscle weakness (generalized)  Cramp and spasm     Problem List Patient Active Problem List   Diagnosis Date Noted  . Diverticulosis of colon without hemorrhage   . GI bleeding 12/19/2015  . CAD S/P PCI 12/15/15 12/17/2015  . Coronary artery disease involving native coronary artery of native heart with angina pectoris (HCC) 12/17/2015  . ST elevation (STEMI) myocardial infarction involving other coronary artery of inferior wall (HCC) 12/15/2015  . Hyperlipidemia 12/15/2015  . VERTIGO  02/16/2009  . VAGINITIS, ATROPHIC, POSTMENOPAUSAL 12/06/2008  . Allergic rhinitis 06/02/2007  . Disorder of bone and cartilage 06/02/2007   06/04/2007, PT, DPT Lysle Rubens Jerett Odonohue 01/25/2021, 11:28 AM  [facility] [address] [place],  [place], [address] Phone: [phone]   Fax:  [phone]  Name: Nancy Neal MRN: [id number] Date of Birth: Jan 22, 1932

## 2021-01-28 ENCOUNTER — Encounter: Payer: Self-pay | Admitting: Physical Therapy

## 2021-01-28 ENCOUNTER — Other Ambulatory Visit: Payer: Self-pay

## 2021-01-28 ENCOUNTER — Ambulatory Visit: Payer: Medicare Other | Admitting: Physical Therapy

## 2021-01-28 DIAGNOSIS — G8929 Other chronic pain: Secondary | ICD-10-CM

## 2021-01-28 DIAGNOSIS — R252 Cramp and spasm: Secondary | ICD-10-CM

## 2021-01-28 DIAGNOSIS — M5441 Lumbago with sciatica, right side: Secondary | ICD-10-CM | POA: Diagnosis not present

## 2021-01-28 DIAGNOSIS — M6281 Muscle weakness (generalized): Secondary | ICD-10-CM

## 2021-01-28 NOTE — Therapy (Signed)
[facility] [address] [place], [place], [address] Phone: [phone]   Fax:  [phone]  Physical Therapy Treatment  Patient Details  Name: Nancy Neal MRN: [id number] Date of Birth: 11-10-1931 Referring Provider (PT): Cirigliano   Encounter Date: 01/28/2021   PT End of Session - 01/28/21 1143    Visit Number 6    Date for PT Re-Evaluation 03/05/21    PT Start Time 1058    PT Stop Time 1144    PT Time Calculation (min) 46 min    Activity Tolerance Patient tolerated treatment well    Behavior During Therapy [facility] for tasks assessed/performed           Past Medical History:  Diagnosis Date  . ALLERGIC RHINITIS 06/02/2007  . Arthritis    "maybe in my right knee" (12/19/2015)  . Coronary artery disease 11/2015   a. 11/2015: Inferior STEMI w/ Promus DES to RCA.    . Diverticulosis of colon 2003   by 2003 and 2008 colonoscopy.   Marland Kitchen GERD (gastroesophageal reflux disease)   . Glaucoma 1-12   open angle  . History of blood transfusion 1971   "when I had hysterectomy"  . Myocardial infarction (HCC) 11/2015  . OSTEOPENIA 06/02/2007  . VAGINITIS, ATROPHIC, POSTMENOPAUSAL 12/06/2008  . VERTIGO 02/16/2009    Past Surgical History:  Procedure Laterality Date  . APPENDECTOMY  1971  . CARDIAC CATHETERIZATION N/A 12/15/2015   Procedure: Left Heart Cath and Coronary Angiography;  Surgeon: Lyn Records, MD;  Location: [facility] INVASIVE CV LAB;  Service: Cardiovascular;  Laterality: N/A;  . CARDIAC CATHETERIZATION N/A 12/15/2015   Procedure: Coronary Stent Intervention;  Surgeon: Lyn Records, MD;  Location: [facility] INVASIVE CV LAB;  Service: Cardiovascular;  Laterality: N/A;  . COLONOSCOPY N/A 12/20/2015   Procedure: COLONOSCOPY;  Surgeon: Hilarie Fredrickson, MD;  Location: [facility] ENDOSCOPY;  Service: Endoscopy;  Laterality: N/A;  . EYE SURGERY Bilateral    "laser tx for glaucoma"  . HEMORRHOID SURGERY  ~ 1968  . SHOULDER ARTHROSCOPY W/ ROTATOR CUFF  REPAIR Right 11/2014  . TOTAL ABDOMINAL HYSTERECTOMY  1971    There were no vitals filed for this visit.   Subjective Assessment - 01/28/21 1100    Subjective Pt states she has been having mild LBP today    Currently in Pain? Yes    Pain Score 4     Pain Location Back    Pain Orientation Right;Lower                             [facility] Adult PT Treatment/Exercise - 01/28/21 0001      Lumbar Exercises: Aerobic   Recumbent Bike L5 x 6 min      Lumbar Exercises: Machines for Strengthening   Other Lumbar Machine Exercise 20# rows and lats 2x10      Lumbar Exercises: Standing   Heel Raises 15 reps    Other Standing Lumbar Exercises standing hip abd/ext red TB 1x10 B    Other Standing Lumbar Exercises resisted gait 20# x4 each direction      Lumbar Exercises: Seated   Long Arc Quad on Chair Both;10 reps;2 sets    LAQ on Chair Weights (lbs) 5    Sit to Stand 20 reps   2x10 red TB around knees and 3# dumbbell chest press   Other Seated Lumbar Exercises seated marches 5# 2x10; ball squeeze x15 3 sec  hold    Other Seated Lumbar Exercises seated iso abs with pball x15 3 sec hold                    PT [place] Term Goals - 01/25/21 1126      PT [place] TERM GOAL #1   Title independent with HEP    Time 2    Period Weeks    Status Achieved    Target Date 01/17/21             PT Long Term Goals - 01/03/21 1011      PT LONG TERM GOAL #1   Title independent with advanced HEP    Time 6    Period Weeks    Status New    Target Date 02/14/21      PT LONG TERM GOAL #2   Title Pt will report resolution of RLE radiating pain    Time 6    Period Weeks    Status New    Target Date 02/14/21      PT LONG TERM GOAL #3   Title Pt will demo RLE SLR equivalent to LLE    Baseline RLE: 65 deg    Time 6    Period Weeks    Status New    Target Date 02/14/21                 Plan - 01/28/21 1144    Clinical Impression Statement Pt tolerated  progression of TE well with no c/o of increased pain with exercise. Red TB around thighs for tactile cue to avoid knee adduction with STS. Added standing hip abd/ext with red TB; cues to avoid compensation with standing hip ex's. Continue to progress lumbar/core stab and RLE flexibility ex's.    PT Treatment/Interventions ADLs/Self Care Home Management;Electrical Stimulation;Iontophoresis 4mg /ml Dexamethasone;Moist Heat;Neuromuscular re-education;Therapeutic exercise;Therapeutic activities;Gait training;Patient/family education;Manual techniques;Passive range of motion    PT Next Visit Plan gentle progression of TE, progress HEP, core/lumbar stab, piriformis stretching, hip flexibility    Consulted and Agree with Plan of Care Patient           Patient will benefit from skilled therapeutic intervention in order to improve the following deficits and impairments:  Decreased range of motion,Increased muscle spasms,Pain,Hypomobility,Impaired flexibility,Decreased strength  Visit Diagnosis: Chronic right-sided low back pain with right-sided sciatica  Muscle weakness (generalized)  Cramp and spasm     Problem List Patient Active Problem List   Diagnosis Date Noted  . Diverticulosis of colon without hemorrhage   . GI bleeding 12/19/2015  . CAD S/P PCI 12/15/15 12/17/2015  . Coronary artery disease involving native coronary artery of native heart with angina pectoris (HCC) 12/17/2015  . ST elevation (STEMI) myocardial infarction involving other coronary artery of inferior wall (HCC) 12/15/2015  . Hyperlipidemia 12/15/2015  . VERTIGO 02/16/2009  . VAGINITIS, ATROPHIC, POSTMENOPAUSAL 12/06/2008  . Allergic rhinitis 06/02/2007  . Disorder of bone and cartilage 06/02/2007   06/04/2007, PT, DPT Lysle Rubens Daundre Biel 01/28/2021, 11:45 AM  [facility] [address] [place], [place], [place] Phone: [phone]   Fax:  [phone]  Name: Nancy Neal MRN: [name] Date of Birth: 1932/06/23

## 2021-01-29 ENCOUNTER — Encounter: Payer: Federal, State, Local not specified - PPO | Admitting: Physical Therapy

## 2021-01-30 ENCOUNTER — Ambulatory Visit: Payer: Medicare Other | Admitting: Physical Therapy

## 2021-01-30 ENCOUNTER — Other Ambulatory Visit: Payer: Self-pay

## 2021-01-30 ENCOUNTER — Encounter: Payer: Self-pay | Admitting: Physical Therapy

## 2021-01-30 DIAGNOSIS — M5441 Lumbago with sciatica, right side: Secondary | ICD-10-CM | POA: Diagnosis not present

## 2021-01-30 DIAGNOSIS — R252 Cramp and spasm: Secondary | ICD-10-CM

## 2021-01-30 DIAGNOSIS — M6281 Muscle weakness (generalized): Secondary | ICD-10-CM

## 2021-01-30 DIAGNOSIS — G8929 Other chronic pain: Secondary | ICD-10-CM

## 2021-01-30 NOTE — Therapy (Signed)
[facility] [place], [place], [address] Phone: [phone]   Fax:  [phone]  Physical Therapy Treatment  Patient Details  Name: Nancy Neal MRN: [id number] Date of Birth: July 02, 1932 Referring Provider (PT): Cirigliano   Encounter Date: 01/30/2021   PT End of Session - 01/30/21 1018    Visit Number 7    Date for PT Re-Evaluation 03/05/21    PT Start Time 0932    PT Stop Time 1013    PT Time Calculation (min) 41 min    Activity Tolerance Patient tolerated treatment well    Behavior During Therapy [facility] for tasks assessed/performed           Past Medical History:  Diagnosis Date  . ALLERGIC RHINITIS 06/02/2007  . Arthritis    "maybe in my right knee" (12/19/2015)  . Coronary artery disease 11/2015   a. 11/2015: Inferior STEMI w/ Promus DES to RCA.    . Diverticulosis of colon 2003   by 2003 and 2008 colonoscopy.   Marland Kitchen GERD (gastroesophageal reflux disease)   . Glaucoma 1-12   open angle  . History of blood transfusion 1971   "when I had hysterectomy"  . Myocardial infarction ([place]) 11/2015  . OSTEOPENIA 06/02/2007  . VAGINITIS, ATROPHIC, POSTMENOPAUSAL 12/06/2008  . VERTIGO 02/16/2009    Past Surgical History:  Procedure Laterality Date  . APPENDECTOMY  1971  . CARDIAC CATHETERIZATION N/A 12/15/2015   Procedure: Left Heart Cath and Coronary Angiography;  Surgeon: Belva Crome, MD;  Location: [place] CV LAB;  Service: Cardiovascular;  Laterality: N/A;  . CARDIAC CATHETERIZATION N/A 12/15/2015   Procedure: Coronary Stent Intervention;  Surgeon: Belva Crome, MD;  Location: [place] CV LAB;  Service: Cardiovascular;  Laterality: N/A;  . COLONOSCOPY N/A 12/20/2015   Procedure: COLONOSCOPY;  Surgeon: Irene Shipper, MD;  Location: [place];  Service: Endoscopy;  Laterality: N/A;  . EYE SURGERY Bilateral    "laser tx for glaucoma"  . HEMORRHOID SURGERY  ~ 1968  . SHOULDER ARTHROSCOPY W/ ROTATOR CUFF  REPAIR Right 11/2014  . TOTAL ABDOMINAL HYSTERECTOMY  1971    There were no vitals filed for this visit.   Subjective Assessment - 01/30/21 0933    Subjective Pt states she has been having increased R sided sciatic nerve pain today    Currently in Pain? Yes    Pain Score 9     Pain Location Back    Pain Orientation Right;Lower                             [facility] Adult PT Treatment/Exercise - 01/30/21 0001      Lumbar Exercises: Stretches   Active Hamstring Stretch Right;Left;1 rep;20 seconds    Piriformis Stretch Right;Left;2 reps;20 seconds   seated   Gastroc Stretch Right;Left;1 rep;20 seconds      Lumbar Exercises: Aerobic   Nustep L5 x 6 min      Lumbar Exercises: Machines for Strengthening   Other Lumbar Machine Exercise 20# rows and lats 2x10      Lumbar Exercises: Standing   Heel Raises 15 reps      Lumbar Exercises: Supine   Pelvic Tilt 10 reps    Clam 20 reps   red TB   Bridge with clamshell 20 reps;2 seconds   with PPT and red TB around thighs   Other Supine Lumbar Exercises supine marching with red TB  cues for iso core contraction x10 B                    PT [place] Term Goals - 01/25/21 1126      PT [place] TERM GOAL #1   Title independent with HEP    Time 2    Period Weeks    Status Achieved    Target Date 01/17/21             PT Long Term Goals - 01/30/21 1009      PT LONG TERM GOAL #1   Title independent with advanced HEP    Time 6    Period Weeks    Status Achieved      PT LONG TERM GOAL #2   Title Pt will report resolution of RLE radiating pain    Baseline reports occasional RLE radiating pain    Time 6    Period Weeks    Status Partially Met      PT LONG TERM GOAL #3   Title Pt will demo RLE SLR equivalent to LLE    Baseline RLE: 65 deg    Time 6    Period Weeks    Status Partially Met                 Plan - 01/30/21 1018    Clinical Impression Statement Pt reporting increased R sided radiating  pain localized to R buttocks this rx. Relief of pain reported with piriformis stretching and nustep. Stabilization ex's with cues for hip abduction and PPT. Would benefit from continued lumbar/core stab along with hip strengthening and RLE flexibility ex's. Discussed dropping frequency to 1x/week for the next few weeks with continuance of HEP. Pt VU and agreement.    PT Treatment/Interventions ADLs/Self Care Home Management;Electrical Stimulation;Iontophoresis 97m/ml Dexamethasone;Moist Heat;Neuromuscular re-education;Therapeutic exercise;Therapeutic activities;Gait training;Patient/family education;Manual techniques;Passive range of motion    PT Next Visit Plan gentle progression of TE, progress HEP, core/lumbar stab, piriformis stretching, hip flexibility    Consulted and Agree with Plan of Care Patient           Patient will benefit from skilled therapeutic intervention in order to improve the following deficits and impairments:  Decreased range of motion,Increased muscle spasms,Pain,Hypomobility,Impaired flexibility,Decreased strength  Visit Diagnosis: Chronic right-sided low back pain with right-sided sciatica  Muscle weakness (generalized)  Cramp and spasm     Problem List Patient Active Problem List   Diagnosis Date Noted  . Diverticulosis of colon without hemorrhage   . GI bleeding 12/19/2015  . CAD S/P PCI 12/15/15 12/17/2015  . Coronary artery disease involving native coronary artery of native heart with angina pectoris ([place] 12/17/2015  . ST elevation (STEMI) myocardial infarction involving other coronary artery of inferior wall ([place] 12/15/2015  . Hyperlipidemia 12/15/2015  . VERTIGO 02/16/2009  . VAGINITIS, ATROPHIC, POSTMENOPAUSAL 12/06/2008  . Allergic rhinitis 06/02/2007  . Disorder of bone and cartilage 06/02/2007   AAmador Cunas PT, DPT ADonald ProseSugg 01/30/2021, 10:25 AM  [place] [address]Phone: [phone]  Fax:  [phone] Name: REMMALEAH MERONEYMRN: [id number] of Birth: 110-06-1932

## 2021-01-31 ENCOUNTER — Encounter: Payer: Federal, State, Local not specified - PPO | Admitting: Physical Therapy

## 2021-02-14 ENCOUNTER — Other Ambulatory Visit: Payer: Self-pay

## 2021-02-14 ENCOUNTER — Ambulatory Visit (INDEPENDENT_AMBULATORY_CARE_PROVIDER_SITE_OTHER): Payer: Medicare Other | Admitting: Podiatry

## 2021-02-14 DIAGNOSIS — B351 Tinea unguium: Secondary | ICD-10-CM | POA: Diagnosis not present

## 2021-02-14 DIAGNOSIS — Z9861 Coronary angioplasty status: Secondary | ICD-10-CM | POA: Diagnosis not present

## 2021-02-14 DIAGNOSIS — I2510 Atherosclerotic heart disease of native coronary artery without angina pectoris: Secondary | ICD-10-CM | POA: Diagnosis not present

## 2021-02-14 DIAGNOSIS — M79674 Pain in right toe(s): Secondary | ICD-10-CM | POA: Diagnosis not present

## 2021-02-14 DIAGNOSIS — M7751 Other enthesopathy of right foot: Secondary | ICD-10-CM | POA: Diagnosis not present

## 2021-02-14 DIAGNOSIS — M2170 Unequal limb length (acquired), unspecified site: Secondary | ICD-10-CM | POA: Diagnosis not present

## 2021-02-14 DIAGNOSIS — L603 Nail dystrophy: Secondary | ICD-10-CM

## 2021-02-14 MED ORDER — TRIAMCINOLONE ACETONIDE 40 MG/ML IJ SUSP
20.0000 mg | Freq: Once | INTRAMUSCULAR | Status: AC
  Administered 2021-02-14: 20 mg

## 2021-02-14 NOTE — Progress Notes (Signed)
She presents today I am concerned about pain in her right anterior lateral foot and ankle she is also complaining of painful toenails.  And states that her right leg is a little shorter than the left.  Objective: Vital signs are stable alert oriented x3.  Pulses are palpable.  Neurologic Sirmans intact deep to reflexes are intact and strength normal symmetrical bilateral.  She does hav right leg that is about a quarter of an inch shorter than the left leg.  Her nails 1 through 5 of the right foot do demonstrate and what appears to be onychomycosis cannot rule out distal nail dystrophy or eczema.  She also has pain on end range of motion of the subtalar joint of the right foot.  Assessments: Capsulitis subtalar joint right foot.  Possible nail dystrophy onychomycosis lesser nails and great nail right.  Also leg length discrepancy.  Plan: Provided her with quarter inch insoles for the heels and then provided her with injection to the subtalar joint right also provided her with a nail sample to be sent for pathologic evaluation follow-up with her in 1 month

## 2021-02-19 ENCOUNTER — Ambulatory Visit: Payer: Medicare Other | Admitting: Physical Therapy

## 2021-02-26 ENCOUNTER — Ambulatory Visit: Payer: Medicare Other | Admitting: Physical Therapy

## 2021-02-28 ENCOUNTER — Other Ambulatory Visit: Payer: Self-pay | Admitting: Interventional Cardiology

## 2021-03-05 ENCOUNTER — Ambulatory Visit: Payer: Medicare Other | Admitting: Physical Therapy

## 2021-03-11 DIAGNOSIS — L821 Other seborrheic keratosis: Secondary | ICD-10-CM | POA: Diagnosis not present

## 2021-03-11 DIAGNOSIS — Z1283 Encounter for screening for malignant neoplasm of skin: Secondary | ICD-10-CM | POA: Diagnosis not present

## 2021-04-24 ENCOUNTER — Ambulatory Visit (INDEPENDENT_AMBULATORY_CARE_PROVIDER_SITE_OTHER): Payer: Medicare Other | Admitting: Family Medicine

## 2021-04-24 ENCOUNTER — Other Ambulatory Visit: Payer: Self-pay

## 2021-04-24 ENCOUNTER — Encounter: Payer: Self-pay | Admitting: Family Medicine

## 2021-04-24 VITALS — BP 122/80 | HR 78 | Temp 98.1°F | Ht 64.0 in | Wt 135.8 lb

## 2021-04-24 DIAGNOSIS — K580 Irritable bowel syndrome with diarrhea: Secondary | ICD-10-CM | POA: Diagnosis not present

## 2021-04-24 MED ORDER — DICYCLOMINE HCL 10 MG PO CAPS: 10.0000 mg | ORAL_CAPSULE | Freq: Three times a day (TID) | ORAL | 0 refills | Status: DC

## 2021-04-24 NOTE — Progress Notes (Signed)
Nancy Neal is a 87 y.o. female  Chief Complaint  Patient presents with   Irritable Bowel Syndrome    Pt c/o bm, with passing gas, when she made appointment.  Pt still c/o having usual amount of gas.    HPI: Nancy Neal is a 87 y.o. female patient who complains of increased/excessive flatus yesterday and overnight. She states she feels better today. No abd pain associated with flatus. Pt states she had a few episodes of fecal incontinence/smearing with flatus but that has resolved. Today she states she is "normal" and back to baseline.  Diet has not changed and she states she has avoided dairy x years. Takes lactaid when she eats yogurt These are not new symptoms and pt states she has "been fighting this for years". She has h/o IBS.  She takes imodium PRN and has done so for years.  Last colonoscopy - 06/2016 w/ Dr. Marina Goodell - moderate diverticulosis, small internal and external hemorrhoids  Past Medical History:  Diagnosis Date   ALLERGIC RHINITIS 06/02/2007   Arthritis    "maybe in my right knee" (12/19/2015)   Coronary artery disease 11/2015   a. 11/2015: Inferior STEMI w/ Promus DES to RCA.     Diverticulosis of colon 2003   by 2003 and 2008 colonoscopy.    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   History of blood transfusion 1971   "when I had hysterectomy"   Myocardial infarction (HCC) 11/2015   OSTEOPENIA 06/02/2007   VAGINITIS, ATROPHIC, POSTMENOPAUSAL 12/06/2008   VERTIGO 02/16/2009    Past Surgical History:  Procedure Laterality Date   APPENDECTOMY  1971   CARDIAC CATHETERIZATION N/A 12/15/2015   Procedure: Left Heart Cath and Coronary Angiography;  Surgeon: Lyn Records, MD;  Location: [facility] INVASIVE CV LAB;  Service: Cardiovascular;  Laterality: N/A;   CARDIAC CATHETERIZATION N/A 12/15/2015   Procedure: Coronary Stent Intervention;  Surgeon: Lyn Records, MD;  Location: [facility] INVASIVE CV LAB;  Service: Cardiovascular;  Laterality: N/A;    COLONOSCOPY N/A 12/20/2015   Procedure: COLONOSCOPY;  Surgeon: Hilarie Fredrickson, MD;  Location: [facility] ENDOSCOPY;  Service: Endoscopy;  Laterality: N/A;   EYE SURGERY Bilateral    "laser tx for glaucoma"   HEMORRHOID SURGERY  ~ 1968   SHOULDER ARTHROSCOPY W/ ROTATOR CUFF REPAIR Right 11/2014   TOTAL ABDOMINAL HYSTERECTOMY  1971    Social History   Socioeconomic History   Marital status: Widowed    Spouse name: Not on file   Number of children: Not on file   Years of education: Not on file   Highest education level: Not on file  Occupational History   Not on file  Tobacco Use   Smoking status: Never   Smokeless tobacco: Never  Vaping Use   Vaping Use: Never used  Substance and Sexual Activity   Alcohol use: Yes    Alcohol/week: 7.0 standard drinks    Types: 7 Glasses of wine per week   Drug use: No   Sexual activity: Never    Birth control/protection: Surgical  Other Topics Concern   Not on file  Social History Narrative   Not on file   Social Determinants of Health   Financial Resource Strain: Not on file  Food Insecurity: Not on file  Transportation Needs: Not on file  Physical Activity: Not on file  Stress: Not on file  Social Connections: Not on file  Intimate Partner Violence: Not on file  Family History  Problem Relation Age of Onset   Suicidality Mother    Heart disease Father    Arthritis Brother    Breast cancer Neg Hx      Immunization History  Administered Date(s) Administered   Fluad Quad(high Dose 65+) 07/04/2020   Influenza Split 07/16/2011, 07/12/2012   Influenza Whole 08/03/2007, 07/20/2008, 08/05/2010, 07/15/2013   Influenza, High Dose Seasonal PF 08/04/2016   Influenza,inj,Quad PF,6+ Mos 08/02/2014   Influenza-Unspecified 08/17/2015, 07/20/2018   Moderna Sars-Covid-2 Vaccination 10/21/2019, 11/28/2019, 08/27/2020, 03/16/2021   Pneumococcal Conjugate-13 01/04/2014   Pneumococcal Polysaccharide-23 12/18/2011   Tetanus 01/04/2014   Zoster  Recombinat (Shingrix) 04/20/2017   Zoster, Live 07/25/2008    Outpatient Encounter Medications as of 04/24/2021  Medication Sig   acetaminophen (TYLENOL) 325 MG tablet Take 2 tablets (650 mg total) by mouth every 4 (four) hours as needed for headache or mild pain.   Ascorbic Acid (VITAMIN C ER PO) Take 1 tablet by mouth daily.   aspirin 81 MG chewable tablet Chew 1 tablet (81 mg total) by mouth daily.   atorvastatin (LIPITOR) 10 MG tablet TAKE 1 TABLET(10 MG) BY MOUTH DAILY   carboxymethylcellulose (REFRESH PLUS) 0.5 % SOLN Place 1 drop into both eyes 3 (three) times daily as needed (dry eyes).    Cyanocobalamin (VITAMIN B-12 PO) Take 1 tablet by mouth daily.   MAGNESIUM PO Take by mouth.   Multiple Vitamin (MULTIVITAMIN WITH MINERALS) TABS tablet Take 1 tablet by mouth daily.   naproxen (NAPROSYN) 500 MG tablet Take 1 tablet (500 mg total) by mouth 2 (two) times daily with a meal.   nitroGLYCERIN (NITROSTAT) 0.4 MG SL tablet PLACE 1 TABLET UNDER THE TONGUE EVERY 5 MINUTES AS NEEDED FOR CHEST PAIN   OVER THE COUNTER MEDICATION Take 1 tablet by mouth daily. Over the counter antihistamine   vitamin E 1000 UNIT capsule vitamin E 670 mg (1,000 unit) capsule   1 capsule every day by oral route.   [DISCONTINUED] VITAMIN E PO Take 1 capsule by mouth daily.    No facility-administered encounter medications on file as of 04/24/2021.     ROS: Pertinent positives and negatives noted in HPI. Remainder of ROS non-contributory    Allergies  Allergen Reactions   Codeine Sulfate Nausea And Vomiting   Meclizine Other (See Comments)    Other reaction(s): Other (See Comments) glaucoma glaucoma   Acetaminophen Nausea Only    Pain Medicines make her sick   Morphine    Tramadol     BP 122/80 (BP Location: Left Arm, Patient Position: Sitting, Cuff Size: Normal)   Pulse 78   Temp 98.1 F (36.7 C) (Temporal)   Ht 5\' 4"  (1.626 m)   Wt 135 lb 12.8 oz (61.6 kg)   SpO2 98%   BMI 23.31 kg/m  Wt  Readings from Last 3 Encounters:  04/24/21 135 lb 12.8 oz (61.6 kg)  01/18/21 132 lb 6.4 oz (60.1 kg)  07/04/20 134 lb 3.2 oz (60.9 kg)   Temp Readings from Last 3 Encounters:  04/24/21 98.1 F (36.7 C) (Temporal)  07/04/20 (!) 97.1 F (36.2 C) (Temporal)  05/25/20 (!) 97 F (36.1 C) (Temporal)   BP Readings from Last 3 Encounters:  04/24/21 122/80  01/18/21 116/62  07/04/20 120/74   Pulse Readings from Last 3 Encounters:  04/24/21 78  01/18/21 76  07/04/20 75     Physical Exam Constitutional:      General: She is not in acute distress.  Appearance: She is not ill-appearing.  Abdominal:     General: Abdomen is flat. Bowel sounds are normal. There is no distension.     Palpations: Abdomen is soft. There is no mass.     Tenderness: There is no abdominal tenderness. There is no guarding or rebound.  Neurological:     Mental Status: She is alert and oriented to person, place, and time.  Psychiatric:        Mood and Affect: Mood normal.        Behavior: Behavior normal.     A/P:  1. Irritable bowel syndrome with diarrhea - chronic x years, symptoms intermittently  - managed with imodium, dietary modification Rx: - dicyclomine (BENTYL) 10 MG capsule; Take 1 capsule (10 mg total) by mouth 4 (four) times daily -  before meals and at bedtime.  Dispense: 120 capsule; Refill: 0 - can take gas-ex PRN - normal colo in 2017 - f/u PRN  This visit occurred during the SARS-CoV-2 public health emergency.  Safety protocols were in place, including screening questions prior to the visit, additional usage of staff PPE, and extensive cleaning of exam room while observing appropriate contact time as indicated for disinfecting solutions.

## 2021-04-25 ENCOUNTER — Telehealth: Payer: Self-pay | Admitting: Family Medicine

## 2021-04-25 NOTE — Telephone Encounter (Signed)
Pt is wanting a cb concerning her shot record. Please advise at [phone].

## 2021-04-26 DIAGNOSIS — H401122 Primary open-angle glaucoma, left eye, moderate stage: Secondary | ICD-10-CM | POA: Diagnosis not present

## 2021-04-26 DIAGNOSIS — Z961 Presence of intraocular lens: Secondary | ICD-10-CM | POA: Diagnosis not present

## 2021-04-26 DIAGNOSIS — H401111 Primary open-angle glaucoma, right eye, mild stage: Secondary | ICD-10-CM | POA: Diagnosis not present

## 2021-04-26 NOTE — Telephone Encounter (Signed)
Left message with pt's husband to have her call office back.

## 2021-04-26 NOTE — Telephone Encounter (Signed)
Pt returned call. Nurse not available. Advise patient her call would be returned. Pt said she will be leaving at 9:35 today, 04/26/21,  for an appt Will not be back until afternoon.

## 2021-04-26 NOTE — Telephone Encounter (Signed)
Left message on voicemail to call office.  

## 2021-05-09 ENCOUNTER — Other Ambulatory Visit: Payer: Self-pay | Admitting: Family Medicine

## 2021-05-09 ENCOUNTER — Ambulatory Visit: Payer: Medicare Other | Attending: Internal Medicine

## 2021-05-09 DIAGNOSIS — Z20822 Contact with and (suspected) exposure to covid-19: Secondary | ICD-10-CM | POA: Diagnosis not present

## 2021-05-09 DIAGNOSIS — M8589 Other specified disorders of bone density and structure, multiple sites: Secondary | ICD-10-CM

## 2021-05-09 DIAGNOSIS — Z1231 Encounter for screening mammogram for malignant neoplasm of breast: Secondary | ICD-10-CM

## 2021-05-10 LAB — SPECIMEN STATUS REPORT

## 2021-05-10 LAB — NOVEL CORONAVIRUS, NAA: SARS-CoV-2, NAA: NOT DETECTED

## 2021-05-10 LAB — SARS-COV-2, NAA 2 DAY TAT

## 2021-05-26 DIAGNOSIS — Z20822 Contact with and (suspected) exposure to covid-19: Secondary | ICD-10-CM | POA: Diagnosis not present

## 2021-06-04 ENCOUNTER — Ambulatory Visit (INDEPENDENT_AMBULATORY_CARE_PROVIDER_SITE_OTHER): Payer: Medicare Other | Admitting: Podiatry

## 2021-06-04 ENCOUNTER — Other Ambulatory Visit: Payer: Self-pay

## 2021-06-04 DIAGNOSIS — I2510 Atherosclerotic heart disease of native coronary artery without angina pectoris: Secondary | ICD-10-CM | POA: Diagnosis not present

## 2021-06-04 DIAGNOSIS — M2011 Hallux valgus (acquired), right foot: Secondary | ICD-10-CM | POA: Diagnosis not present

## 2021-06-04 DIAGNOSIS — Z9861 Coronary angioplasty status: Secondary | ICD-10-CM

## 2021-06-05 NOTE — Progress Notes (Signed)
She presents today for follow-up of her nail culture which did demonstrate onychomycosis.  She states that her dermatologist is giving her medicine currently and seems to making the nails soft enough that they are about to fall off and may be new ones will grow she says.  She is also concerned about bunion on her right foot.  Objective: Vital signs are stable she is alert and oriented x3.  Pulses are palpable.  Pathology does demonstrate severe onychomycosis.  She does have moderate to severe hallux abductovalgus deformity of the right foot moderately tender on palpation with no limitation on range of motion.  Consistent with hallux valgus.  Assessment: Hallux valgus right onychomycosis.  Plan: At this point she does not want any of the laser therapy topical therapies or oral therapy she is going to continue with the topical therapy that her dermatologist has prescribed at this point.  I discussed surgery for her bunion repair of her right foot and the length of time it would be necessary for her to heal this she declined surgical intervention I will follow-up with her on an as-needed basis.

## 2021-06-16 ENCOUNTER — Other Ambulatory Visit: Payer: Self-pay

## 2021-06-16 ENCOUNTER — Ambulatory Visit
Admission: EM | Admit: 2021-06-16 | Discharge: 2021-06-16 | Disposition: A | Discharge status: Home / Self Care | Payer: Medicare Other | Attending: Internal Medicine | Admitting: Internal Medicine

## 2021-06-16 ENCOUNTER — Encounter: Payer: Self-pay | Admitting: Emergency Medicine

## 2021-06-16 DIAGNOSIS — R300 Dysuria: Secondary | ICD-10-CM

## 2021-06-16 DIAGNOSIS — N3001 Acute cystitis with hematuria: Secondary | ICD-10-CM | POA: Insufficient documentation

## 2021-06-16 LAB — POCT URINALYSIS DIP (MANUAL ENTRY)
Bilirubin, UA: NEGATIVE
Glucose, UA: NEGATIVE mg/dL
Ketones, POC UA: NEGATIVE mg/dL
Nitrite, UA: NEGATIVE
Protein Ur, POC: NEGATIVE mg/dL
Spec Grav, UA: 1.015 (ref 1.010–1.025)
Urobilinogen, UA: 0.2 E.U./dL
pH, UA: 8.5 — AB (ref 5.0–8.0)

## 2021-06-16 MED ORDER — CEPHALEXIN 500 MG PO CAPS: 500.0000 mg | ORAL_CAPSULE | Freq: Four times a day (QID) | ORAL | 0 refills | Status: AC

## 2021-06-16 NOTE — ED Provider Notes (Signed)
[facility]    CSN: [id number] Arrival date & time: 06/16/21  0809      History   Chief Complaint No chief complaint on file.   HPI Nancy Neal is a 87 y.o. female.   Patient presents with 2-day history of urinary burning.  Denies any urinary frequency, hematuria, abdominal pain, fever, abdominal pain, vaginal discharge.    Past Medical History:  Diagnosis Date   ALLERGIC RHINITIS 06/02/2007   Arthritis    "maybe in my right knee" (12/19/2015)   Coronary artery disease 11/2015   a. 11/2015: Inferior STEMI w/ Promus DES to RCA.     Diverticulosis of colon 2003   by 2003 and 2008 colonoscopy.    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   History of blood transfusion 1971   "when I had hysterectomy"   Myocardial infarction (HCC) 11/2015   OSTEOPENIA 06/02/2007   VAGINITIS, ATROPHIC, POSTMENOPAUSAL 12/06/2008   VERTIGO 02/16/2009    Patient Active Problem List   Diagnosis Date Noted   Diverticulosis of colon without hemorrhage    GI bleeding 12/19/2015   CAD S/P PCI 12/15/15 12/17/2015   Coronary artery disease involving native coronary artery of native heart with angina pectoris (HCC) 12/17/2015   ST elevation (STEMI) myocardial infarction involving other coronary artery of inferior wall (HCC) 12/15/2015   Hyperlipidemia 12/15/2015   VERTIGO 02/16/2009   VAGINITIS, ATROPHIC, POSTMENOPAUSAL 12/06/2008   Allergic rhinitis 06/02/2007   Disorder of bone and cartilage 06/02/2007    Past Surgical History:  Procedure Laterality Date   APPENDECTOMY  1971   CARDIAC CATHETERIZATION N/A 12/15/2015   Procedure: Left Heart Cath and Coronary Angiography;  Surgeon: Lyn Records, MD;  Location: [facility] INVASIVE CV LAB;  Service: Cardiovascular;  Laterality: N/A;   CARDIAC CATHETERIZATION N/A 12/15/2015   Procedure: Coronary Stent Intervention;  Surgeon: Lyn Records, MD;  Location: [facility] INVASIVE CV LAB;  Service: Cardiovascular;  Laterality: N/A;    COLONOSCOPY N/A 12/20/2015   Procedure: COLONOSCOPY;  Surgeon: Hilarie Fredrickson, MD;  Location: [facility] ENDOSCOPY;  Service: Endoscopy;  Laterality: N/A;   EYE SURGERY Bilateral    "laser tx for glaucoma"   HEMORRHOID SURGERY  ~ 1968   SHOULDER ARTHROSCOPY W/ ROTATOR CUFF REPAIR Right 11/2014   TOTAL ABDOMINAL HYSTERECTOMY  1971    OB History   No obstetric history on file.      Home Medications    Prior to Admission medications   Medication Sig Start Date End Date Taking? Authorizing Provider  cephALEXin (KEFLEX) 500 MG capsule Take 1 capsule (500 mg total) by mouth 4 (four) times daily for 5 days. 06/16/21 06/21/21 Yes Lance Muss, FNP  acetaminophen (TYLENOL) 325 MG tablet Take 2 tablets (650 mg total) by mouth every 4 (four) hours as needed for headache or mild pain. 12/17/15   Abelino Derrick, PA-C  Ascorbic Acid (VITAMIN C ER PO) Take 1 tablet by mouth daily.    [provider]  aspirin 81 MG chewable tablet Chew 1 tablet (81 mg total) by mouth daily. 12/17/15   Abelino Derrick, PA-C  atorvastatin (LIPITOR) 10 MG tablet TAKE 1 TABLET(10 MG) BY MOUTH DAILY 02/28/21   Lyn Records, MD  carboxymethylcellulose (REFRESH PLUS) 0.5 % SOLN Place 1 drop into both eyes 3 (three) times daily as needed (dry eyes).     [provider]  Cyanocobalamin (VITAMIN B-12 PO) Take 1 tablet by mouth daily.  [provider]  dicyclomine (BENTYL) 10 MG capsule Take 1 capsule (10 mg total) by mouth 4 (four) times daily -  before meals and at bedtime. 04/24/21   Cirigliano, Jearld Lesch, DO  MAGNESIUM PO Take by mouth.    [provider]  Multiple Vitamin (MULTIVITAMIN WITH MINERALS) TABS tablet Take 1 tablet by mouth daily.    [provider]  naproxen (NAPROSYN) 500 MG tablet Take 1 tablet (500 mg total) by mouth 2 (two) times daily with a meal. 05/25/20   Nche, Bonna Gains, NP  Niacin (VITAMIN B-3 PO) Take by mouth.    [provider]  nitroGLYCERIN (NITROSTAT) 0.4 MG  SL tablet PLACE 1 TABLET UNDER THE TONGUE EVERY 5 MINUTES AS NEEDED FOR CHEST PAIN 09/20/19   Lyn Records, MD  OVER THE COUNTER MEDICATION Take 1 tablet by mouth daily. Over the counter antihistamine    [provider]  vitamin E 1000 UNIT capsule vitamin E 670 mg (1,000 unit) capsule   1 capsule every day by oral route.    [provider]    Family History Family History  Problem Relation Age of Onset   Suicidality Mother    Heart disease Father    Arthritis Brother    Breast cancer Neg Hx     Social History Social History   Tobacco Use   Smoking status: Never   Smokeless tobacco: Never  Vaping Use   Vaping Use: Never used  Substance Use Topics   Alcohol use: Yes    Alcohol/week: 7.0 standard drinks    Types: 7 Glasses of wine per week   Drug use: No     Allergies   Codeine sulfate, Meclizine, Acetaminophen, Morphine, and Tramadol   Review of Systems Review of Systems Per HPI  Physical Exam Triage Vital Signs ED Triage Vitals  Enc Vitals Group     BP 06/16/21 0822 (!) 153/84     Pulse Rate 06/16/21 0822 80     Resp 06/16/21 0822 16     Temp 06/16/21 0822 97.6 F (36.4 C)     Temp Source 06/16/21 0822 Oral     SpO2 06/16/21 0822 95 %     Weight --      Height --      Head Circumference --      Peak Flow --      Pain Score 06/16/21 0828 0     Pain Loc --      Pain Edu? --      Excl. in GC? --    No data found.  Updated Vital Signs BP (!) 153/84 (BP Location: Left Arm)   Pulse 80   Temp 97.6 F (36.4 C) (Oral)   Resp 16   SpO2 95%   Visual Acuity Right Eye Distance:   Left Eye Distance:   Bilateral Distance:    Right Eye Near:   Left Eye Near:    Bilateral Near:     Physical Exam Constitutional:      Appearance: Normal appearance.  HENT:     Head: Normocephalic and atraumatic.  Eyes:     Extraocular Movements: Extraocular movements intact.     Conjunctiva/sclera: Conjunctivae normal.  Cardiovascular:     Rate and  Rhythm: Normal rate and regular rhythm.     Pulses: Normal pulses.     Heart sounds: Normal heart sounds.  Pulmonary:     Effort: Pulmonary effort is normal. No respiratory distress.  Breath sounds: Normal breath sounds.  Abdominal:     General: Abdomen is flat. Bowel sounds are normal. There is no distension.     Palpations: Abdomen is soft.     Tenderness: There is no abdominal tenderness.  Skin:    General: Skin is warm and dry.  Neurological:     General: No focal deficit present.     Mental Status: She is alert and oriented to person, place, and time. Mental status is at baseline.  Psychiatric:        Mood and Affect: Mood normal.        Behavior: Behavior normal.        Thought Content: Thought content normal.        Judgment: Judgment normal.     UC Treatments / Results  Labs (all labs ordered are listed, but only abnormal results are displayed) Labs Reviewed  POCT URINALYSIS DIP (MANUAL ENTRY) - Abnormal; Notable for the following components:      Result Value   Clarity, UA cloudy (*)    Blood, UA large (*)    pH, UA 8.5 (*)    Leukocytes, UA Small (1+) (*)    All other components within normal limits  URINE CULTURE    EKG   Radiology No results found.  Procedures Procedures (including critical care time)  Medications Ordered in UC Medications - No data to display  Initial Impression / Assessment and Plan / UC Course  I have reviewed the triage vital signs and the nursing notes.  Pertinent labs & imaging results that were available during my care of the patient were reviewed by me and considered in my medical decision making (see chart for details).      Urinalysis showing signs of UTI. Will treat with cephalexin due to patient's age. Patient to increase clear oral fluid intake. Urine culture is pending. Discussed strict return precautions. Patient verbalized understanding and is agreeable with plan.  Final Clinical Impressions(s) / UC Diagnoses    Final diagnoses:  Dysuria  Acute cystitis with hematuria     Discharge Instructions      Your urine did show signs of urinary tract infection. You are being treated with cephalexin antibiotic. Please increase clear oral fluid intake. Urine culture is pending. We will call if this is positive.      ED Prescriptions     Medication Sig Dispense Auth. Provider   cephALEXin (KEFLEX) 500 MG capsule Take 1 capsule (500 mg total) by mouth 4 (four) times daily for 5 days. 20 capsule Lance Muss, FNP      PDMP not reviewed this encounter.   Lance Muss, FNP 06/16/21 [phone]

## 2021-06-16 NOTE — Discharge Instructions (Addendum)
Your urine did show signs of urinary tract infection. You are being treated with cephalexin antibiotic. Please increase clear oral fluid intake. Urine culture is pending. We will call if this is positive.

## 2021-06-16 NOTE — ED Triage Notes (Signed)
Began having burning with urination Friday night. Denies back pain, fever

## 2021-06-18 LAB — URINE CULTURE: Culture: >=100000 — AB

## 2021-06-19 ENCOUNTER — Telehealth (HOSPITAL_COMMUNITY): Payer: Self-pay | Admitting: Emergency Medicine

## 2021-06-19 MED ORDER — NITROFURANTOIN MONOHYD MACRO 100 MG PO CAPS: 100.0000 mg | ORAL_CAPSULE | Freq: Two times a day (BID) | ORAL | 0 refills | Status: DC

## 2021-06-22 ENCOUNTER — Telehealth: Payer: Federal, State, Local not specified - PPO

## 2021-06-24 ENCOUNTER — Telehealth: Payer: Self-pay | Admitting: Emergency Medicine

## 2021-06-25 ENCOUNTER — Other Ambulatory Visit: Payer: Self-pay

## 2021-06-25 DIAGNOSIS — R109 Unspecified abdominal pain: Secondary | ICD-10-CM | POA: Diagnosis not present

## 2021-06-25 DIAGNOSIS — Z96611 Presence of right artificial shoulder joint: Secondary | ICD-10-CM | POA: Diagnosis not present

## 2021-06-25 DIAGNOSIS — I2510 Atherosclerotic heart disease of native coronary artery without angina pectoris: Secondary | ICD-10-CM | POA: Insufficient documentation

## 2021-06-25 DIAGNOSIS — K5792 Diverticulitis of intestine, part unspecified, without perforation or abscess without bleeding: Secondary | ICD-10-CM | POA: Insufficient documentation

## 2021-06-25 DIAGNOSIS — R1032 Left lower quadrant pain: Secondary | ICD-10-CM | POA: Diagnosis present

## 2021-06-25 DIAGNOSIS — R300 Dysuria: Secondary | ICD-10-CM | POA: Diagnosis not present

## 2021-06-25 DIAGNOSIS — K5732 Diverticulitis of large intestine without perforation or abscess without bleeding: Secondary | ICD-10-CM | POA: Diagnosis not present

## 2021-06-26 ENCOUNTER — Other Ambulatory Visit: Payer: Self-pay

## 2021-06-26 ENCOUNTER — Emergency Department (HOSPITAL_COMMUNITY)
Admission: EM | Admit: 2021-06-26 | Discharge: 2021-06-26 | Disposition: A | Discharge status: Home / Self Care | Payer: Medicare Other | Attending: Emergency Medicine | Admitting: Emergency Medicine

## 2021-06-26 ENCOUNTER — Emergency Department (HOSPITAL_COMMUNITY): Payer: Medicare Other

## 2021-06-26 DIAGNOSIS — K5792 Diverticulitis of intestine, part unspecified, without perforation or abscess without bleeding: Secondary | ICD-10-CM

## 2021-06-26 DIAGNOSIS — R109 Unspecified abdominal pain: Secondary | ICD-10-CM | POA: Diagnosis not present

## 2021-06-26 LAB — CBC WITH DIFFERENTIAL/PLATELET
Abs Immature Granulocytes: 0.02 10*3/uL (ref 0.00–0.07)
Basophils Absolute: 0 10*3/uL (ref 0.0–0.1)
Basophils Relative: 1 %
Eosinophils Absolute: 0.3 10*3/uL (ref 0.0–0.5)
Eosinophils Relative: 5 %
HCT: 43.9 % (ref 36.0–46.0)
Hemoglobin: 14.5 g/dL (ref 12.0–15.0)
Immature Granulocytes: 0 %
Lymphocytes Relative: 34 %
Lymphs Abs: 2.1 10*3/uL (ref 0.7–4.0)
MCH: 32.2 pg (ref 26.0–34.0)
MCHC: 33 g/dL (ref 30.0–36.0)
MCV: 97.3 fL (ref 80.0–100.0)
Monocytes Absolute: 0.7 10*3/uL (ref 0.1–1.0)
Monocytes Relative: 12 %
Neutro Abs: 3 10*3/uL (ref 1.7–7.7)
Neutrophils Relative %: 48 %
Platelets: 251 10*3/uL (ref 150–400)
RBC: 4.51 MIL/uL (ref 3.87–5.11)
RDW: 13.1 % (ref 11.5–15.5)
WBC: 6.2 10*3/uL (ref 4.0–10.5)
nRBC: 0 % (ref 0.0–0.2)

## 2021-06-26 LAB — COMPREHENSIVE METABOLIC PANEL
ALT: 13 U/L (ref 0–44)
AST: 19 U/L (ref 15–41)
Albumin: 3.5 g/dL (ref 3.5–5.0)
Alkaline Phosphatase: 59 U/L (ref 38–126)
Anion gap: 9 (ref 5–15)
BUN: 23 mg/dL (ref 8–23)
CO2: 26 mmol/L (ref 22–32)
Calcium: 9.4 mg/dL (ref 8.9–10.3)
Chloride: 103 mmol/L (ref 98–111)
Creatinine, Ser: 0.99 mg/dL (ref 0.44–1.00)
GFR, Estimated: 56 mL/min — ABNORMAL LOW (ref >60)
Glucose, Bld: 174 mg/dL — ABNORMAL HIGH (ref 70–99)
Potassium: 4.9 mmol/L (ref 3.5–5.1)
Sodium: 138 mmol/L (ref 135–145)
Total Bilirubin: 1.5 mg/dL — ABNORMAL HIGH (ref 0.3–1.2)
Total Protein: 5.8 g/dL — ABNORMAL LOW (ref 6.5–8.1)

## 2021-06-26 LAB — URINALYSIS, ROUTINE W REFLEX MICROSCOPIC
Bacteria, UA: NONE SEEN
Bilirubin Urine: NEGATIVE
Glucose, UA: NEGATIVE mg/dL
Hgb urine dipstick: NEGATIVE
Ketones, ur: NEGATIVE mg/dL
Nitrite: NEGATIVE
Protein, ur: NEGATIVE mg/dL
Specific Gravity, Urine: 1.015 (ref 1.005–1.030)
pH: 5 (ref 5.0–8.0)

## 2021-06-26 MED ORDER — ONDANSETRON 4 MG PO TBDP: 4.0000 mg | ORAL_TABLET | Freq: Three times a day (TID) | ORAL | 0 refills | Status: DC | PRN

## 2021-06-26 MED ORDER — AMOXICILLIN-POT CLAVULANATE 875-125 MG PO TABS: 1.0000 | ORAL_TABLET | Freq: Two times a day (BID) | ORAL | 0 refills | Status: DC

## 2021-06-26 MED ORDER — IOHEXOL 350 MG/ML SOLN
75.0000 mL | Freq: Once | INTRAVENOUS | Status: AC | PRN
  Administered 2021-06-26: 75 mL via INTRAVENOUS

## 2021-06-26 NOTE — ED Provider Notes (Signed)
[facility] EMERGENCY DEPARTMENT Provider Note   CSN: [id number] Arrival date & time: 06/25/21  2331     History Chief Complaint  Patient presents with   Urinary Tract Infection    Nancy Neal is a 87 y.o. female.  Patient presents to ER chief complaint of concern for drug-resistant urinary tract infection.  She had dysuria off and on for the past 10 days.  Denies fevers or back pain.  She was seen in urgent care prescribed additional antibiotic and then changed after cultures came back to [facility] which has been taking.  She was called and told to go to the ER as she may need "IV medication."  Denies fevers or cough or vomiting or diarrhea.  Complaining of lower abdominal pain however.      Past Medical History:  Diagnosis Date   ALLERGIC RHINITIS 06/02/2007   Arthritis    "maybe in my right knee" (12/19/2015)   Coronary artery disease 11/2015   a. 11/2015: Inferior STEMI w/ Promus DES to RCA.     Diverticulosis of colon 2003   by 2003 and 2008 colonoscopy.    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   History of blood transfusion 1971   "when I had hysterectomy"   Myocardial infarction (HCC) 11/2015   OSTEOPENIA 06/02/2007   VAGINITIS, ATROPHIC, POSTMENOPAUSAL 12/06/2008   VERTIGO 02/16/2009    Patient Active Problem List   Diagnosis Date Noted   Diverticulosis of colon without hemorrhage    GI bleeding 12/19/2015   CAD S/P PCI 12/15/15 12/17/2015   Coronary artery disease involving native coronary artery of native heart with angina pectoris (HCC) 12/17/2015   ST elevation (STEMI) myocardial infarction involving other coronary artery of inferior wall (HCC) 12/15/2015   Hyperlipidemia 12/15/2015   VERTIGO 02/16/2009   VAGINITIS, ATROPHIC, POSTMENOPAUSAL 12/06/2008   Allergic rhinitis 06/02/2007   Disorder of bone and cartilage 06/02/2007    Past Surgical History:  Procedure Laterality Date   APPENDECTOMY  1971   CARDIAC  CATHETERIZATION N/A 12/15/2015   Procedure: Left Heart Cath and Coronary Angiography;  Surgeon: Lyn Records, MD;  Location: [facility] INVASIVE CV LAB;  Service: Cardiovascular;  Laterality: N/A;   CARDIAC CATHETERIZATION N/A 12/15/2015   Procedure: Coronary Stent Intervention;  Surgeon: Lyn Records, MD;  Location: [facility] INVASIVE CV LAB;  Service: Cardiovascular;  Laterality: N/A;   COLONOSCOPY N/A 12/20/2015   Procedure: COLONOSCOPY;  Surgeon: Hilarie Fredrickson, MD;  Location: [facility] ENDOSCOPY;  Service: Endoscopy;  Laterality: N/A;   EYE SURGERY Bilateral    "laser tx for glaucoma"   HEMORRHOID SURGERY  ~ 1968   SHOULDER ARTHROSCOPY W/ ROTATOR CUFF REPAIR Right 11/2014   TOTAL ABDOMINAL HYSTERECTOMY  1971     OB History   No obstetric history on file.     Family History  Problem Relation Age of Onset   Suicidality Mother    Heart disease Father    Arthritis Brother    Breast cancer Neg Hx     Social History   Tobacco Use   Smoking status: Never   Smokeless tobacco: Never  Vaping Use   Vaping Use: Never used  Substance Use Topics   Alcohol use: Yes    Alcohol/week: 7.0 standard drinks    Types: 7 Glasses of wine per week   Drug use: No    Home Medications Prior to Admission medications   Medication Sig Start Date End Date Taking?  Authorizing Provider  acetaminophen (TYLENOL) 325 MG tablet Take 2 tablets (650 mg total) by mouth every 4 (four) hours as needed for headache or mild pain. 12/17/15  Yes Kilroy, Luke K, PA-C  amoxicillin-clavulanate (AUGMENTIN) 875-125 MG tablet Take 1 tablet by mouth every 12 (twelve) hours. 06/26/21  Yes Cheryll Cockayne, MD  Ascorbic Acid (VITAMIN C ER PO) Take 1 tablet by mouth daily.   Yes [provider]  aspirin 81 MG chewable tablet Chew 1 tablet (81 mg total) by mouth daily. 12/17/15  Yes Kilroy, Luke K, PA-C  atorvastatin (LIPITOR) 10 MG tablet TAKE 1 TABLET(10 MG) BY MOUTH DAILY Patient taking differently: Take 10 mg by mouth daily. 02/28/21  Yes  Lyn Records, MD  carboxymethylcellulose (REFRESH PLUS) 0.5 % SOLN Place 1 drop into both eyes 3 (three) times daily as needed (dry eyes).    Yes [provider]  Cyanocobalamin (VITAMIN B-12 PO) Take 1 tablet by mouth daily.   Yes [provider]  ergocalciferol (VITAMIN D2) 1.25 MG (50000 UT) capsule Take 50,000 Units by mouth once a week.   Yes [provider]  MAGNESIUM PO Take by mouth.   Yes [provider]  Multiple Vitamin (MULTIVITAMIN WITH MINERALS) TABS tablet Take 1 tablet by mouth daily.   Yes [provider]  Niacin (VITAMIN B-3 PO) Take 1 tablet by mouth daily.   Yes [provider]  nitroGLYCERIN (NITROSTAT) 0.4 MG SL tablet PLACE 1 TABLET UNDER THE TONGUE EVERY 5 MINUTES AS NEEDED FOR CHEST PAIN Patient taking differently: Place 0.4 mg under the tongue every 5 (five) minutes as needed for chest pain. 09/20/19  Yes Lyn Records, MD  vitamin E 1000 UNIT capsule Take 1,000 Units by mouth daily.   Yes [provider]  dicyclomine (BENTYL) 10 MG capsule Take 1 capsule (10 mg total) by mouth 4 (four) times daily -  before meals and at bedtime. Patient not taking: Reported on 06/26/2021 04/24/21   Overton Mam, DO  naproxen (NAPROSYN) 500 MG tablet Take 1 tablet (500 mg total) by mouth 2 (two) times daily with a meal. Patient not taking: No sig reported 05/25/20   Nche, Bonna Gains, NP  nitrofurantoin, macrocrystal-monohydrate, (MACROBID) 100 MG capsule Take 1 capsule (100 mg total) by mouth 2 (two) times daily. Patient not taking: Reported on 06/26/2021 06/19/21   Merrilee Jansky, MD    Allergies    Codeine sulfate, Meclizine, Acetaminophen, Morphine, and Tramadol  Review of Systems   Review of Systems  Constitutional:  Negative for fever.  HENT:  Negative for ear pain.   Eyes:  Negative for pain.  Respiratory:  Negative for cough.   Cardiovascular:  Negative for chest pain.  Gastrointestinal:  Positive for  abdominal pain.  Genitourinary:  Negative for flank pain.  Musculoskeletal:  Negative for back pain.  Skin:  Negative for rash.  Neurological:  Negative for headaches.   Physical Exam Updated Vital Signs BP (!) 150/97   Pulse 65   Temp 97.8 F (36.6 C) (Oral)   Resp 16   Ht 5\' 2"  (1.575 m)   Wt 61.2 kg   SpO2 98%   BMI 24.69 kg/m   Physical Exam Constitutional:      General: She is not in acute distress.    Appearance: Normal appearance.  HENT:     Head: Normocephalic.     Nose: Nose normal.  Eyes:     Extraocular Movements: Extraocular movements intact.  Cardiovascular:     Rate and Rhythm: Normal rate.  Pulmonary:     Effort: Pulmonary effort is normal.  Abdominal:     Tenderness: There is no right CVA tenderness or left CVA tenderness.     Comments: Tenderness in the mid lower abdominal to left lower abdominal quadrant region.  No guarding or rebound.  Musculoskeletal:        General: Normal range of motion.     Cervical back: Normal range of motion.  Neurological:     General: No focal deficit present.     Mental Status: She is alert. Mental status is at baseline.    ED Results / Procedures / Treatments   Labs (all labs ordered are listed, but only abnormal results are displayed) Labs Reviewed  COMPREHENSIVE METABOLIC PANEL - Abnormal; Notable for the following components:      Result Value   Glucose, Bld 174 (*)    Total Protein 5.8 (*)    Total Bilirubin 1.5 (*)    GFR, Estimated 56 (*)    All other components within normal limits  URINALYSIS, ROUTINE W REFLEX MICROSCOPIC - Abnormal; Notable for the following components:   Leukocytes,Ua TRACE (*)    All other components within normal limits  CBC WITH DIFFERENTIAL/PLATELET    EKG None  Radiology CT Abdomen Pelvis W Contrast  Result Date: 06/26/2021 CLINICAL DATA:  Abdominal pain EXAM: CT ABDOMEN AND PELVIS WITH CONTRAST TECHNIQUE: Multidetector CT imaging of the abdomen and pelvis was performed  using the standard protocol following bolus administration of intravenous contrast. CONTRAST:  94mL OMNIPAQUE IOHEXOL 350 MG/ML SOLN COMPARISON:  CT abdomen/pelvis 06/21/2004 FINDINGS: Lower chest: There is consolidation in the right base. Linear opacity in the left base likely reflects atelectasis. There is dense coronary artery calcification. There is no pericardial effusion. Hepatobiliary: There are multiple hypodense lesions in the liver measuring up to 4.0 cm in the left hepatic lobe. Multiple lesions were present on the CT from 2005 but have overall increased in size. These most likely reflect benign cysts. There are no enhancing lesions. The gallbladder is unremarkable. There is no biliary ductal dilatation. Pancreas: Unremarkable. Spleen: Unremarkable. Adrenals/Urinary Tract: The adrenals are unremarkable. The kidneys are normal, with no focal lesion, stone, hydronephrosis, or hydroureter. The bladder is unremarkable. Stomach/Bowel: The stomach is unremarkable. There is no evidence of bowel obstruction. There is sigmoid diverticulosis. There is mild inflammatory change surrounding a [place] segment of sigmoid colon with associated wall thickening best appreciated in the coronal images (6-64). There is no other abnormal bowel wall thickening or inflammatory change. There is a moderate stool burden throughout the colon. Vascular/Lymphatic: There is calcified atherosclerotic plaque throughout the nonaneurysmal abdominal aorta. The major branch vessels are patent. The main portal and splenic veins are patent. There is no abdominal or pelvic lymphadenopathy. Reproductive: The uterus is surgically absent. There is no adnexal mass. Other: There is a small amount of free fluid in the left pelvis adjacent to the inflamed loop of sigmoid colon. There is no evidence of abscess formation. There is no free intraperitoneal air. Musculoskeletal: There is age-indeterminate compression deformity of the L1 vertebral body with  approximately 30% loss of vertebral body height anteriorly. There is grade 1/2 anterolisthesis of L3 on L4 and L4 on L5 with moderate to severe spinal canal stenosis and probable impingement of the exiting right L4 nerve root. IMPRESSION: 1. Findings above suspicious for mild acute uncomplicated diverticulitis involving a loop of sigmoid colon. 2. Right basilar consolidation  may reflect atelectasis or pneumonia. 3. Age-indeterminate compression deformity of the L1 vertebral body. 4. Grade 1/2 anterolisthesis of L3 on L4 and L4 on L5 with moderate to severe spinal canal stenosis at these levels and probable impingement of the exiting right L4 nerve root. 5. Dense coronary artery calcification and Aortic Atherosclerosis (ICD10-I70.0). Electronically Signed   By: Lesia Hausen M.D.   On: 06/26/2021 10:50    Procedures Procedures   Medications Ordered in ED Medications  iohexol (OMNIPAQUE) 350 MG/ML injection 75 mL (75 mLs Intravenous Contrast Given 06/26/21 1016)    ED Course  I have reviewed the triage vital signs and the nursing notes.  Pertinent labs & imaging results that were available during my care of the patient were reviewed by me and considered in my medical decision making (see chart for details).    MDM Rules/Calculators/A&P                           Labs unremarkable white count chemistry normal.  Urinalysis shows no bacteria.  However CT abdomen pelvis concerning for mild diverticulitis.  Patient continues on Macrobid which advised her to finish.  Given a prescription of Augmentin for diverticulitis.  Advise follow-up with her primary care doctor within the week.  Advised immediate return to the ER for worsening pain fevers or any additional concerns.  Final Clinical Impression(s) / ED Diagnoses Final diagnoses:  Diverticulitis    Rx / DC Orders ED Discharge Orders          Ordered    amoxicillin-clavulanate (AUGMENTIN) 875-125 MG tablet  Every 12 hours        06/26/21  1111             Cheryll Cockayne, MD 06/26/21 1112

## 2021-06-26 NOTE — ED Notes (Signed)
Pt transported back to room at this time.

## 2021-06-26 NOTE — ED Notes (Signed)
Pt transported to CT via stretcher at this time.  

## 2021-06-26 NOTE — ED Provider Notes (Signed)
Emergency Medicine Provider Triage Evaluation Note  KIARI HOSMER , a 87 y.o. female  was evaluated in triage.  Pt complains of dysuria.  Patient was seen at [facility] urgent care on 8/28, diagnosed with UTI and initially treated with Keflex, urine culture showed ESBL E. coli, they attempted to change her antibiotics to [facility] but patient reports that this antibiotic made her very nauseated so she was only able to tolerate few doses of this.  Called back to urgent care and they recommended she come to the ED for IV antibiotics.  She reports her symptoms have improved but still have not completely resolved.  Denies abdominal or flank pain.  No fevers or chills, no nausea except for when she was using the Macrobid.  Review of Systems  Positive: Dysuria Negative: Fever, flank pain, abdominal pain, vomiting  Physical Exam  BP 91/75 (BP Location: Right Arm)   Pulse 87   Temp 98.1 F (36.7 C) (Oral)   Resp 16   Ht 5\' 2"  (1.575 m)   Wt 61.2 kg   SpO2 97%   BMI 24.69 kg/m  Gen:   Awake, no distress   Resp:  Normal effort  MSK:   Moves extremities without difficulty  Other:  Abdomen soft, nontender throughout, no CVA tenderness bilaterally  Medical Decision Making  Medically screening exam initiated at 12:19 AM.  Appropriate orders placed.  Telicia Hodgkiss Flaim was informed that the remainder of the evaluation will be completed by another provider, this initial triage assessment does not replace that evaluation, and the importance of remaining in the ED until their evaluation is complete.     Jerilee Hoh, PA-C 06/26/21 08/26/21    [id number], MD 06/26/21 0400

## 2021-06-26 NOTE — Discharge Instructions (Addendum)
Continue taking the Macrobid antibiotic.  Take the new antibiotic in addition to the one you are taking now.  Follow-up with the primary care doctor within the week.  Return immediately back to the ER for fevers worsening pain or any additional concerns.

## 2021-06-26 NOTE — ED Triage Notes (Signed)
Pt c/o bladder infection and reports a dull pain when she urinates.

## 2021-07-01 ENCOUNTER — Ambulatory Visit (INDEPENDENT_AMBULATORY_CARE_PROVIDER_SITE_OTHER): Payer: Federal, State, Local not specified - PPO

## 2021-07-01 DIAGNOSIS — Z0001 Encounter for general adult medical examination with abnormal findings: Secondary | ICD-10-CM | POA: Insufficient documentation

## 2021-07-01 DIAGNOSIS — Z0000 Encounter for general adult medical examination without abnormal findings: Secondary | ICD-10-CM | POA: Insufficient documentation

## 2021-07-01 NOTE — Progress Notes (Deleted)
Subjective       Objective             Assessment/Plan

## 2021-07-02 NOTE — Progress Notes (Signed)
No AWV done due to patient declined to do it for the following reasons.  Last one she did in 2020 after that she states that she was told she couldn't have a physical with the provider.  Dm/cma

## 2021-07-04 ENCOUNTER — Ambulatory Visit (INDEPENDENT_AMBULATORY_CARE_PROVIDER_SITE_OTHER): Payer: Medicare Other | Admitting: Nurse Practitioner

## 2021-07-04 ENCOUNTER — Encounter: Payer: Self-pay | Admitting: Nurse Practitioner

## 2021-07-04 ENCOUNTER — Other Ambulatory Visit: Payer: Self-pay

## 2021-07-04 VITALS — BP 102/60 | HR 84 | Temp 97.0°F | Ht 64.0 in | Wt 131.0 lb

## 2021-07-04 DIAGNOSIS — K5732 Diverticulitis of large intestine without perforation or abscess without bleeding: Secondary | ICD-10-CM

## 2021-07-04 DIAGNOSIS — Z23 Encounter for immunization: Secondary | ICD-10-CM

## 2021-07-04 DIAGNOSIS — N3946 Mixed incontinence: Secondary | ICD-10-CM | POA: Diagnosis not present

## 2021-07-04 NOTE — Assessment & Plan Note (Signed)
Improved GI symptoms with augmentin x 1week Advised to use probiotics x 2weeks. Advance diet as tolerated, provided printed information Maintain adequate oral hydration.

## 2021-07-04 NOTE — Progress Notes (Signed)
Subjective:  Patient ID: Nancy Neal, female    DOB: 1932-05-16  Age: 87 y.o. MRN: [id number]  CC: Follow-up (ED follow up for diverticulitis. )  GI Problem The primary symptoms include abdominal pain and diarrhea. Primary symptoms do not include fever, weight loss, fatigue, nausea, vomiting, melena, hematemesis, jaundice, hematochezia, dysuria, myalgias, arthralgias or rash. The illness began 6 to 7 days ago. The onset was gradual. The problem has been rapidly improving.  The illness does not include chills, anorexia, dysphagia, odynophagia, bloating, constipation, tenesmus, back pain or itching. Significant associated medical issues include irritable bowel syndrome.  Completed macrobid and augmentin as prescribed Has mild LLQ ABD tenderness, no melena or hematochezia. She also noticed loose stool with oral abx (1-2 loose stool per day) She is able to maintain adequate oral hydration and BRAT diet.  Reviewed lab results and radiology report from recent ED visit: cbc, cmp, urinalysis and CT ABD.  Wt Readings from Last 3 Encounters:  07/04/21 131 lb (59.4 kg)  06/26/21 135 lb (61.2 kg)  04/24/21 135 lb 12.8 oz (61.6 kg)    Reviewed past Medical, Social and Family history today.  Outpatient Medications Prior to Visit  Medication Sig Dispense Refill   acetaminophen (TYLENOL) 325 MG tablet Take 2 tablets (650 mg total) by mouth every 4 (four) hours as needed for headache or mild pain.     Ascorbic Acid (VITAMIN C ER PO) Take 1 tablet by mouth daily.     aspirin 81 MG chewable tablet Chew 1 tablet (81 mg total) by mouth daily.     atorvastatin (LIPITOR) 10 MG tablet TAKE 1 TABLET(10 MG) BY MOUTH DAILY (Patient taking differently: Take 10 mg by mouth daily.) 90 tablet 3   carboxymethylcellulose (REFRESH PLUS) 0.5 % SOLN Place 1 drop into both eyes 3 (three) times daily as needed (dry eyes).      ergocalciferol (VITAMIN D2) 1.25 MG (50000 UT) capsule Take 50,000 Units by mouth once a week.      MAGNESIUM PO Take by mouth.     Multiple Vitamin (MULTIVITAMIN WITH MINERALS) TABS tablet Take 1 tablet by mouth daily.     nitroGLYCERIN (NITROSTAT) 0.4 MG SL tablet PLACE 1 TABLET UNDER THE TONGUE EVERY 5 MINUTES AS NEEDED FOR CHEST PAIN (Patient taking differently: Place 0.4 mg under the tongue every 5 (five) minutes as needed for chest pain.) 25 tablet 3   vitamin E 1000 UNIT capsule Take 1,000 Units by mouth daily.     amoxicillin-clavulanate (AUGMENTIN) 875-125 MG tablet Take 1 tablet by mouth every 12 (twelve) hours. (Patient not taking: Reported on 07/04/2021) 14 tablet 0   Cyanocobalamin (VITAMIN B-12 PO) Take 1 tablet by mouth daily. (Patient not taking: Reported on 07/04/2021)     dicyclomine (BENTYL) 10 MG capsule Take 1 capsule (10 mg total) by mouth 4 (four) times daily -  before meals and at bedtime. (Patient not taking: No sig reported) 120 capsule 0   naproxen (NAPROSYN) 500 MG tablet Take 1 tablet (500 mg total) by mouth 2 (two) times daily with a meal. (Patient not taking: No sig reported) 14 tablet 0   Niacin (VITAMIN B-3 PO) Take 1 tablet by mouth daily. (Patient not taking: Reported on 07/04/2021)     nitrofurantoin, macrocrystal-monohydrate, (MACROBID) 100 MG capsule Take 1 capsule (100 mg total) by mouth 2 (two) times daily. (Patient not taking: No sig reported) 10 capsule 0   ondansetron (ZOFRAN ODT) 4 MG disintegrating tablet Take 1 tablet (4  mg total) by mouth every 8 (eight) hours as needed for up to 10 doses for nausea or vomiting. (Patient not taking: Reported on 07/04/2021) 10 tablet 0   No facility-administered medications prior to visit.   ROS See HPI  Objective:  BP 102/60 (BP Location: Left Arm, Patient Position: Sitting, Cuff Size: Normal)   Pulse 84   Temp (!) 97 F (36.1 C) (Temporal)   Ht 5\' 4"  (1.626 m)   Wt 131 lb (59.4 kg)   SpO2 98%   BMI 22.49 kg/m   Physical Exam Constitutional:      General: She is not in acute distress. Cardiovascular:      Pulses: Normal pulses.  Pulmonary:     Effort: Pulmonary effort is normal.  Abdominal:     General: Bowel sounds are normal. There is no distension.     Palpations: Abdomen is soft. There is no mass.     Tenderness: There is abdominal tenderness in the left lower quadrant. There is no right CVA tenderness, left CVA tenderness, guarding or rebound.     Hernia: No hernia is present.  Neurological:     Mental Status: She is alert.    Assessment & Plan:  This visit occurred during the SARS-CoV-2 public health emergency.  Safety protocols were in place, including screening questions prior to the visit, additional usage of staff PPE, and extensive cleaning of exam room while observing appropriate contact time as indicated for disinfecting solutions.   Paulena was seen today for follow-up.  Diagnoses and all orders for this visit:  Diverticulitis of large intestine without perforation or abscess without bleeding  Flu vaccine need -     Flu Vaccine QUAD High Dose(Fluad)  Urge and stress incontinence  Problem List Items Addressed This Visit       Digestive   Diverticulitis of intestine without perforation or abscess - Primary    Improved GI symptoms with augmentin x 1week Advised to use probiotics x 2weeks. Advance diet as tolerated, provided printed information Maintain adequate oral hydration.        Other   Urge and stress incontinence    Provided information on kegel exercise      Other Visit Diagnoses     Flu vaccine need       Relevant Orders   Flu Vaccine QUAD High Dose(Fluad)       Follow-up: Return in about 3 months (around 10/03/2021) for IBS-D.  10/05/2021, NP

## 2021-07-04 NOTE — Assessment & Plan Note (Signed)
Provided information on kegel exercise

## 2021-07-04 NOTE — Patient Instructions (Addendum)
Start probiotics 1cap BID x 2weeks (curturelle or florastor or align) Call office if diarrhea and LLQ ABD pain does not resolve in 1week. Call office sooner if you develop fever, blood in stool or worsening pain.  Start kegel exercise to help strengthen your pelvic floor.  Diverticulitis Diverticulitis is when small pouches in your colon (large intestine) get infected or swollen. This causes pain in the belly (abdomen) and watery poop (diarrhea). These pouches are called diverticula. The pouches form in people who have a condition called diverticulosis. What are the causes? This condition may be caused by poop (stool) that gets trapped in the pouches in your colon. The poop lets germs (bacteria) grow in the pouches. This causes the infection. What increases the risk? You are more likely to get this condition if you have small pouches in your colon. The risk is higher if: You are overweight or very overweight (obese). You do not exercise enough. You drink alcohol. You smoke or use products with tobacco in them. You eat a diet that has a lot of red meat such as beef, pork, or lamb. You eat a diet that does not have enough fiber in it. You are older than 85 years of age. What are the signs or symptoms? Pain in the belly. Pain is often on the left side, but it may be in other areas. Fever and feeling cold. Feeling like you may vomit. Vomiting. Having cramps. Feeling full. Changes to how often you poop. Blood in your poop. How is this treated? Most cases are treated at home by: Taking over-the-counter pain medicines. Following a clear liquid diet. Taking antibiotic medicines. Resting. Very bad cases may need to be treated at a hospital. This may include: Not eating or drinking. Taking prescription pain medicine. Getting antibiotic medicines through an IV tube. Getting fluid and food through an IV tube. Having surgery. When you are feeling better, your doctor may tell you to have a  test to check your colon (colonoscopy). Follow these instructions at home: Medicines Take over-the-counter and prescription medicines only as told by your doctor. These include: Antibiotics. Pain medicines. Fiber pills. Probiotics. Stool softeners. If you were prescribed an antibiotic medicine, take it as told by your doctor. Do not stop taking the antibiotic even if you start to feel better. Ask your doctor if the medicine prescribed to you requires you to avoid driving or using machinery. Eating and drinking  Follow a diet as told by your doctor. When you feel better, your doctor may tell you to change your diet. You may need to eat a lot of fiber. Fiber makes it easier to poop (have a bowel movement). Foods with fiber include: Berries. Beans. Lentils. Green vegetables. Avoid eating red meat. General instructions Do not use any products that contain nicotine or tobacco, such as cigarettes, e-cigarettes, and chewing tobacco. If you need help quitting, ask your doctor. Exercise 3 or more times a week. Try to get 30 minutes each time. Exercise enough to sweat and make your heart beat faster. Keep all follow-up visits as told by your doctor. This is important. Contact a doctor if: Your pain does not get better. You are not pooping like normal. Get help right away if: Your pain gets worse. Your symptoms do not get better. Your symptoms get worse very fast. You have a fever. You vomit more than one time. You have poop that is: Bloody. Black. Tarry. Summary This condition happens when small pouches in your colon get infected or  swollen. Take medicines only as told by your doctor. Follow a diet as told by your doctor. Keep all follow-up visits as told by your doctor. This is important. This information is not intended to replace advice given to you by your health care provider. Make sure you discuss any questions you have with your health care provider. Document Revised:  07/18/2019 Document Reviewed: 07/18/2019 Elsevier Patient Education  2022 [employer] Kegel Exercises Kegel exercises can help strengthen your pelvic floor muscles. The pelvic floor is a group of muscles that support your rectum, small intestine, and bladder. In females, pelvic floor muscles also help support the womb (uterus). These muscles help you control the flow of urine and stool. Kegel exercises are painless and simple, and they do not require any equipment. Your provider may suggest Kegel exercises to: Improve bladder and bowel control. Improve sexual response. Improve weak pelvic floor muscles after surgery to remove the uterus (hysterectomy) or pregnancy (females). Improve weak pelvic floor muscles after prostate gland removal or surgery (males). Kegel exercises involve squeezing your pelvic floor muscles, which are the same muscles you squeeze when you try to stop the flow of urine or keep from passing gas. The exercises can be done while sitting, standing, or lying down, but it is best to vary your position. Exercises How to do Kegel exercises: Squeeze your pelvic floor muscles tight. You should feel a tight lift in your rectal area. If you are a female, you should also feel a tightness in your vaginal area. Keep your stomach, buttocks, and legs relaxed. Hold the muscles tight for up to 10 seconds. Breathe normally. Relax your muscles. Repeat as told by your health care provider. Repeat this exercise daily as told by your health care provider. Continue to do this exercise for at least 4-6 weeks, or for as long as told by your health care provider. You may be referred to a physical therapist who can help you learn more about how to do Kegel exercises. Depending on your condition, your health care provider may recommend: Varying how long you squeeze your muscles. Doing several sets of exercises every day. Doing exercises for several weeks. Making Kegel exercises a part of your  regular exercise routine. This information is not intended to replace advice given to you by your health care provider. Make sure you discuss any questions you have with your health care provider. Document Revised: 09/26/2020 Document Reviewed: 05/26/2018 Elsevier Patient Education  2022 [employer].

## 2021-07-15 ENCOUNTER — Other Ambulatory Visit: Payer: Self-pay

## 2021-07-17 ENCOUNTER — Other Ambulatory Visit: Payer: Self-pay

## 2021-07-17 ENCOUNTER — Ambulatory Visit (INDEPENDENT_AMBULATORY_CARE_PROVIDER_SITE_OTHER): Payer: Medicare Other | Admitting: Nurse Practitioner

## 2021-07-17 ENCOUNTER — Encounter: Payer: Self-pay | Admitting: Nurse Practitioner

## 2021-07-17 VITALS — BP 138/70 | HR 80 | Temp 97.1°F | Wt 137.0 lb

## 2021-07-17 DIAGNOSIS — K5732 Diverticulitis of large intestine without perforation or abscess without bleeding: Secondary | ICD-10-CM

## 2021-07-17 MED ORDER — AMOXICILLIN-POT CLAVULANATE 875-125 MG PO TABS: 1.0000 | ORAL_TABLET | Freq: Two times a day (BID) | ORAL | 0 refills | Status: DC

## 2021-07-17 NOTE — Patient Instructions (Addendum)
It is important to maintain adequate oral hydration and high fiber diet. You can supplement dietary fiber with benefiber or metamucil 1scoop BID. Continue probiotic 1cap daily Start augmentin 1tab BID with food  If symptoms do not completely resolve in another 2-3weeks, we need to repeat CT ABD. If symptoms worsen, call the office If you develop fever or blood in stool, go to ED.  High-Fiber Eating Plan Fiber, also called dietary fiber, is a type of carbohydrate. It is found foods such as fruits, vegetables, whole grains, and beans. A high-fiber diet can have many health benefits. Your health care provider may recommend a high-fiber diet to help: Prevent constipation. Fiber can make your bowel movements more regular. Lower your cholesterol. Relieve the following conditions: Inflammation of veins in the anus (hemorrhoids). Inflammation of specific areas of the digestive tract (uncomplicated diverticulosis). A problem of the large intestine, also called the colon, that sometimes causes pain and diarrhea (irritable bowel syndrome, or IBS). Prevent overeating as part of a weight-loss plan. Prevent heart disease, type 2 diabetes, and certain cancers. What are tips for following this plan? Reading food labels  Check the nutrition facts label on food products for the amount of dietary fiber. Choose foods that have 5 grams of fiber or more per serving. The goals for recommended daily fiber intake include: Men (age 40 or younger): 34-38 g. Men (over age 45): 28-34 g. Women (age 48 or younger): 25-28 g. Women (over age 49): 22-25 g. Your daily fiber goal is _____________ g. Shopping Choose whole fruits and vegetables instead of processed forms, such as apple juice or applesauce. Choose a wide variety of high-fiber foods such as avocados, lentils, oats, and kidney beans. Read the nutrition facts label of the foods you choose. Be aware of foods with added fiber. These foods often have high sugar  and sodium amounts per serving. Cooking Use whole-grain flour for baking and cooking. Cook with brown rice instead of white rice. Meal planning Start the day with a breakfast that is high in fiber, such as a cereal that contains 5 g of fiber or more per serving. Eat breads and cereals that are made with whole-grain flour instead of refined flour or white flour. Eat brown rice, bulgur wheat, or millet instead of white rice. Use beans in place of meat in soups, salads, and pasta dishes. Be sure that half of the grains you eat each day are whole grains. General information You can get the recommended daily intake of dietary fiber by: Eating a variety of fruits, vegetables, grains, nuts, and beans. Taking a fiber supplement if you are not able to take in enough fiber in your diet. It is better to get fiber through food than from a supplement. Gradually increase how much fiber you consume. If you increase your intake of dietary fiber too quickly, you may have bloating, cramping, or gas. Drink plenty of water to help you digest fiber. Choose high-fiber snacks, such as berries, raw vegetables, nuts, and popcorn. What foods should I eat? Fruits Berries. Pears. Apples. Oranges. Avocado. Prunes and raisins. Dried figs. Vegetables Sweet potatoes. Spinach. Kale. Artichokes. Cabbage. Broccoli. Cauliflower. Green peas. Carrots. Squash. Grains Whole-grain breads. Multigrain cereal. Oats and oatmeal. Brown rice. Barley. Bulgur wheat. Millet. Quinoa. Bran muffins. Popcorn. Rye wafer crackers. Meats and other proteins Navy beans, kidney beans, and pinto beans. Soybeans. Split peas. Lentils. Nuts and seeds. Dairy Fiber-fortified yogurt. Beverages Fiber-fortified soy milk. Fiber-fortified orange juice. Other foods Fiber bars. The items listed above  may not be a complete list of recommended foods and beverages. Contact a dietitian for more information. What foods should I avoid? Fruits Fruit juice.  Cooked, strained fruit. Vegetables Fried potatoes. Canned vegetables. Well-cooked vegetables. Grains White bread. Pasta made with refined flour. White rice. Meats and other proteins Fatty cuts of meat. Fried chicken or fried fish. Dairy Milk. Yogurt. Cream cheese. Sour cream. Fats and oils Butters. Beverages Soft drinks. Other foods Cakes and pastries. The items listed above may not be a complete list of foods and beverages to avoid. Talk with your dietitian about what choices are best for you. Summary Fiber is a type of carbohydrate. It is found in foods such as fruits, vegetables, whole grains, and beans. A high-fiber diet has many benefits. It can help to prevent constipation, lower blood cholesterol, aid weight loss, and reduce your risk of heart disease, diabetes, and certain cancers. Increase your intake of fiber gradually. Increasing fiber too quickly may cause cramping, bloating, and gas. Drink plenty of water while you increase the amount of fiber you consume. The best sources of fiber include whole fruits and vegetables, whole grains, nuts, seeds, and beans. This information is not intended to replace advice given to you by your health care provider. Make sure you discuss any questions you have with your health care provider. Document Revised: 02/09/2020 Document Reviewed: 02/09/2020 Elsevier Patient Education  2022 [employer].

## 2021-07-17 NOTE — Progress Notes (Signed)
Subjective:  Patient ID: Nancy Neal, female    DOB: [birth year]  Age: 87 y.o. MRN: [id number]  CC: Follow-up (Pt c/o loose stool since 07/04/2021.)  HPI  Diverticulitis of intestine without perforation or abscess Reports persistent LLQ abdominal pain, but resolves diarrhea. No fever, no dysuria, no hematuria and no blood in stool. She is able to eat and drink normal. She has maintained probiotic supplement 1cap BID for last 2weeks.  Resume augmentin 1tab BID x 7days Add benefiber dietary supplement BID Maintain probiotic 1cap daily. maintain adequate oral hydration Consider repeat CT or referral to Gi if persistent LLQ pain x 2-3weeks  Reviewed past Medical, Social and Family history today.  Outpatient Medications Prior to Visit  Medication Sig Dispense Refill   acetaminophen (TYLENOL) 325 MG tablet Take 2 tablets (650 mg total) by mouth every 4 (four) hours as needed for headache or mild pain.     Ascorbic Acid (VITAMIN C ER PO) Take 1 tablet by mouth daily.     aspirin 81 MG chewable tablet Chew 1 tablet (81 mg total) by mouth daily.     atorvastatin (LIPITOR) 10 MG tablet TAKE 1 TABLET(10 MG) BY MOUTH DAILY (Patient taking differently: Take 10 mg by mouth daily.) 90 tablet 3   carboxymethylcellulose (REFRESH PLUS) 0.5 % SOLN Place 1 drop into both eyes 3 (three) times daily as needed (dry eyes).      ergocalciferol (VITAMIN D2) 1.25 MG (50000 UT) capsule Take 50,000 Units by mouth once a week.     MAGNESIUM PO Take by mouth.     Multiple Vitamin (MULTIVITAMIN WITH MINERALS) TABS tablet Take 1 tablet by mouth daily.     vitamin E 1000 UNIT capsule Take 1,000 Units by mouth daily.     amoxicillin-clavulanate (AUGMENTIN) 875-125 MG tablet Take 1 tablet by mouth every 12 (twelve) hours. (Patient not taking: Reported on 07/04/2021) 14 tablet 0   Cyanocobalamin (VITAMIN B-12 PO) Take 1 tablet by mouth daily. (Patient not taking: Reported on 07/04/2021)     dicyclomine (BENTYL) 10 MG  capsule Take 1 capsule (10 mg total) by mouth 4 (four) times daily -  before meals and at bedtime. (Patient not taking: No sig reported) 120 capsule 0   naproxen (NAPROSYN) 500 MG tablet Take 1 tablet (500 mg total) by mouth 2 (two) times daily with a meal. (Patient not taking: No sig reported) 14 tablet 0   Niacin (VITAMIN B-3 PO) Take 1 tablet by mouth daily. (Patient not taking: Reported on 07/04/2021)     nitrofurantoin, macrocrystal-monohydrate, (MACROBID) 100 MG capsule Take 1 capsule (100 mg total) by mouth 2 (two) times daily. (Patient not taking: No sig reported) 10 capsule 0   nitroGLYCERIN (NITROSTAT) 0.4 MG SL tablet PLACE 1 TABLET UNDER THE TONGUE EVERY 5 MINUTES AS NEEDED FOR CHEST PAIN (Patient taking differently: Place 0.4 mg under the tongue every 5 (five) minutes as needed for chest pain.) 25 tablet 3   ondansetron (ZOFRAN ODT) 4 MG disintegrating tablet Take 1 tablet (4 mg total) by mouth every 8 (eight) hours as needed for up to 10 doses for nausea or vomiting. (Patient not taking: Reported on 07/04/2021) 10 tablet 0   No facility-administered medications prior to visit.    ROS See HPI  Objective:  BP 138/70   Pulse 80   Temp (!) 97.1 F (36.2 C) (Temporal)   Wt 137 lb (62.1 kg)   SpO2 95%   BMI 23.52 kg/m   Physical  Exam Constitutional:      General: She is not in acute distress.    Appearance: She is not ill-appearing.  Cardiovascular:     Rate and Rhythm: Normal rate.     Pulses: Normal pulses.  Pulmonary:     Effort: Pulmonary effort is normal.  Abdominal:     General: Bowel sounds are normal. There is no distension.     Palpations: Abdomen is soft. There is no mass.     Tenderness: There is abdominal tenderness in the suprapubic area and left lower quadrant. There is no guarding.     Hernia: No hernia is present.  Neurological:     Mental Status: She is alert and oriented to person, place, and time.   Assessment & Plan:  This visit occurred during the  SARS-CoV-2 public health emergency.  Safety protocols were in place, including screening questions prior to the visit, additional usage of staff PPE, and extensive cleaning of exam room while observing appropriate contact time as indicated for disinfecting solutions.   Arriah was seen today for follow-up.  Diagnoses and all orders for this visit:  Diverticulitis of large intestine without perforation or abscess without bleeding -     amoxicillin-clavulanate (AUGMENTIN) 875-125 MG tablet; Take 1 tablet by mouth 2 (two) times daily.  Problem List Items Addressed This Visit       Digestive   Diverticulitis of intestine without perforation or abscess - Primary    Reports persistent LLQ abdominal pain, but resolves diarrhea. No fever, no dysuria, no hematuria and no blood in stool. She is able to eat and drink normal. She has maintained probiotic supplement 1cap BID for last 2weeks.  Resume augmentin 1tab BID x 7days Add benefiber dietary supplement BID Maintain probiotic 1cap daily. maintain adequate oral hydration Consider repeat CT or referral to Gi if persistent LLQ pain x 2-3weeks      Relevant Medications   amoxicillin-clavulanate (AUGMENTIN) 875-125 MG tablet    Follow-up: No follow-ups on file.  Alysia Penna, NP

## 2021-07-17 NOTE — Assessment & Plan Note (Signed)
Reports persistent LLQ abdominal pain, but resolves diarrhea. No fever, no dysuria, no hematuria and no blood in stool. She is able to eat and drink normal. She has maintained probiotic supplement 1cap BID for last 2weeks.  Resume augmentin 1tab BID x 7days Add benefiber dietary supplement BID Maintain probiotic 1cap daily. maintain adequate oral hydration Consider repeat CT or referral to Gi if persistent LLQ pain x 2-3weeks

## 2021-07-18 NOTE — Addendum Note (Signed)
Addended by: Junita Push, Thurnell Garbe M on: 07/18/2021 06:00 PM   Modules accepted: Level of Service

## 2021-08-16 ENCOUNTER — Telehealth: Payer: Self-pay

## 2021-08-16 NOTE — Telephone Encounter (Signed)
Pt saying she is still having stomach issues. Wants to know if Claris Gower thinks she should see a gastroenterologist.

## 2021-08-20 NOTE — Telephone Encounter (Signed)
LVM for patient to return call Per Claris Gower Nche-NP last OV note patient informed a referral could be placed or repeat CT could be done if symptoms did not improve. Waiting for patient to return call to confirm she would like referral.

## 2021-08-21 ENCOUNTER — Other Ambulatory Visit: Payer: Self-pay

## 2021-08-22 ENCOUNTER — Ambulatory Visit (INDEPENDENT_AMBULATORY_CARE_PROVIDER_SITE_OTHER): Payer: Medicare Other | Admitting: Nurse Practitioner

## 2021-08-22 ENCOUNTER — Encounter: Payer: Self-pay | Admitting: Nurse Practitioner

## 2021-08-22 VITALS — BP 126/62 | HR 70 | Temp 97.2°F | Ht 64.0 in | Wt 135.8 lb

## 2021-08-22 DIAGNOSIS — R1013 Epigastric pain: Secondary | ICD-10-CM

## 2021-08-22 MED ORDER — PANTOPRAZOLE SODIUM 20 MG PO TBEC: DELAYED_RELEASE_TABLET | ORAL | 0 refills | Status: DC

## 2021-08-22 MED ORDER — PANTOPRAZOLE SODIUM 20 MG PO TBEC: 20.0000 mg | DELAYED_RELEASE_TABLET | Freq: Two times a day (BID) | ORAL | 0 refills | Status: DC

## 2021-08-22 NOTE — Progress Notes (Signed)
Subjective:  Patient ID: Nancy Neal, female    DOB: 1932/06/11  Age: 87 y.o. MRN: [id number]  CC: Acute Visit (Pt c/o stomach pain )  Abdominal Pain This is a new problem. The current episode started in the past 7 days. The onset quality is gradual. The problem occurs constantly. The problem has been unchanged. The pain is located in the epigastric region and periumbilical region. The quality of the pain is burning and dull. The abdominal pain does not radiate. Pertinent negatives include no anorexia, belching, constipation, diarrhea, dysuria, fever, frequency, hematochezia, hematuria, melena, nausea, vomiting or weight loss. Nothing aggravates the pain. The pain is relieved by Nothing. She has tried nothing for the symptoms. Prior diagnostic workup includes CT scan. Her past medical history is significant for GERD. There is no history of abdominal surgery, colon cancer, Crohn's disease, gallstones, irritable bowel syndrome, pancreatitis, PUD or ulcerative colitis.  Resolved diverticulosis in 27months ago with use of augmentin.  Wt Readings from Last 3 Encounters:  08/22/21 135 lb 12.8 oz (61.6 kg)  07/17/21 137 lb (62.1 kg)  07/04/21 131 lb (59.4 kg)    Reviewed past Medical, Social and Family history today.  Outpatient Medications Prior to Visit  Medication Sig Dispense Refill   acetaminophen (TYLENOL) 325 MG tablet Take 2 tablets (650 mg total) by mouth every 4 (four) hours as needed for headache or mild pain.     Ascorbic Acid (VITAMIN C ER PO) Take 1 tablet by mouth daily.     aspirin 81 MG chewable tablet Chew 1 tablet (81 mg total) by mouth daily.     atorvastatin (LIPITOR) 10 MG tablet TAKE 1 TABLET(10 MG) BY MOUTH DAILY (Patient taking differently: Take 10 mg by mouth daily.) 90 tablet 3   carboxymethylcellulose (REFRESH PLUS) 0.5 % SOLN Place 1 drop into both eyes 3 (three) times daily as needed (dry eyes).      ergocalciferol (VITAMIN D2) 1.25 MG (50000 UT) capsule Take 50,000  Units by mouth once a week.     MAGNESIUM PO Take by mouth.     Multiple Vitamin (MULTIVITAMIN WITH MINERALS) TABS tablet Take 1 tablet by mouth daily.     vitamin E 1000 UNIT capsule Take 1,000 Units by mouth daily.     amoxicillin-clavulanate (AUGMENTIN) 875-125 MG tablet Take 1 tablet by mouth 2 (two) times daily. (Patient not taking: Reported on 08/22/2021) 14 tablet 0   No facility-administered medications prior to visit.    ROS See HPI  Objective:  BP 126/62 (BP Location: Left Arm, Patient Position: Sitting, Cuff Size: Normal)   Pulse 70   Temp (!) 97.2 F (36.2 C) (Temporal)   Ht 5\' 4"  (1.626 m)   Wt 135 lb 12.8 oz (61.6 kg)   SpO2 100%   BMI 23.31 kg/m   Physical Exam Vitals reviewed.  Cardiovascular:     Rate and Rhythm: Normal rate.     Pulses: Normal pulses.  Pulmonary:     Effort: Pulmonary effort is normal.  Abdominal:     General: There is no distension.     Palpations: There is no mass.     Tenderness: There is abdominal tenderness in the epigastric area. There is no right CVA tenderness, left CVA tenderness, guarding or rebound. Negative signs include Murphy's sign, Rovsing's sign, McBurney's sign, psoas sign and obturator sign.     Hernia: No hernia is present.  Neurological:     Mental Status: She is alert.   Assessment &  Plan:  This visit occurred during the SARS-CoV-2 public health emergency.  Safety protocols were in place, including screening questions prior to the visit, additional usage of staff PPE, and extensive cleaning of exam room while observing appropriate contact time as indicated for disinfecting solutions.   Nancy Neal was seen today for acute visit.  Diagnoses and all orders for this visit:  Epigastric pain -     H. pylori breath test -     Discontinue: pantoprazole (PROTONIX) 20 MG tablet; Take 1 tablet (20 mg total) by mouth 2 (two) times daily. -     pantoprazole (PROTONIX) 20 MG tablet; 1tab BID before meals x 1week, then 1tab daily  before breakfast  Dyspepsia -     H. pylori breath test -     Discontinue: pantoprazole (PROTONIX) 20 MG tablet; Take 1 tablet (20 mg total) by mouth 2 (two) times daily. -     pantoprazole (PROTONIX) 20 MG tablet; 1tab BID before meals x 1week, then 1tab daily before breakfast  Provided information on GERD diet Advised to avoid skipping meals or eating within 2hrs of bedtime. Call office if no improvement in 1-2weeks.  Problem List Items Addressed This Visit   None Visit Diagnoses     Epigastric pain    -  Primary   Relevant Medications   pantoprazole (PROTONIX) 20 MG tablet   Other Relevant Orders   H. pylori breath test   Dyspepsia       Relevant Medications   pantoprazole (PROTONIX) 20 MG tablet   Other Relevant Orders   H. pylori breath test       Follow-up: Return if symptoms worsen or fail to improve.  Alysia Penna, NP

## 2021-08-22 NOTE — Patient Instructions (Signed)
Go to lab for H. Pylori test. Start pantoprazole 20mg  BID x 1week then 1tab daill till complete. Call office if no improvement in 1-2weeks  Food Choices for Gastroesophageal Reflux Disease, Adult When you have gastroesophageal reflux disease (GERD), the foods you eat and your eating habits are very important. Choosing the right foods can help ease your discomfort. Think about working with a food expert (dietitian) to help you make good choices. What are tips for following this plan? Reading food labels Look for foods that are low in saturated fat. Foods that may help with your symptoms include: Foods that have less than 5% of daily value (DV) of fat. Foods that have 0 grams of trans fat. Cooking Do not fry your food. Cook your food by baking, steaming, grilling, or broiling. These are all methods that do not need a lot of fat for cooking. To add flavor, try to use herbs that are low in spice and acidity. Meal planning  Choose healthy foods that are low in fat, such as: Fruits and vegetables. Whole grains. Low-fat dairy products. Lean meats, fish, and poultry. Eat small meals often instead of eating 3 large meals each day. Eat your meals slowly in a place where you are relaxed. Avoid bending over or lying down until 2-3 hours after eating. Limit high-fat foods such as fatty meats or fried foods. Limit your intake of fatty foods, such as oils, butter, and shortening. Avoid the following as told by your doctor: Foods that cause symptoms. These may be different for different people. Keep a food diary to keep track of foods that cause symptoms. Alcohol. Drinking a lot of liquid with meals. Eating meals during the 2-3 hours before bed. Lifestyle Stay at a healthy weight. Ask your doctor what weight is healthy for you. If you need to lose weight, work with your doctor to do so safely. Exercise for at least 30 minutes on 5 or more days each week, or as told by your doctor. Wear loose-fitting  clothes. Do not smoke or use any products that contain nicotine or tobacco. If you need help quitting, ask your doctor. Sleep with the head of your bed higher than your feet. Use a wedge under the mattress or blocks under the bed frame to raise the head of the bed. Chew sugar-free gum after meals. What foods should eat? Eat a healthy, well-balanced diet of fruits, vegetables, whole grains, low-fat dairy products, lean meats, fish, and poultry. Each person is different. Foods that may cause symptoms in one person may not cause any symptoms in another person. Work with your doctor to find foods that are safe for you. The items listed above may not be a complete list of what you can eat and drink. Contact a food expert for more options. What foods should I avoid? Limiting some of these foods may help in managing the symptoms of GERD. Everyone is different. Talk with a food expert or your doctor to help you find the exact foods to avoid, if any. Fruits Any fruits prepared with added fat. Any fruits that cause symptoms. For some people, this may include citrus fruits, such as oranges, grapefruit, pineapple, and lemons. Vegetables Deep-fried vegetables. fries. Any vegetables prepared with added fat. Any vegetables that cause symptoms. For some people, this may include tomatoes and tomato products, chili peppers, onions and garlic, and horseradish. Grains Pastries or quick breads with added fat. Meats and other proteins High-fat meats, such as fatty beef or pork, hot dogs,  ribs, ham, sausage, salami, and bacon. Fried meat or protein, including fried fish and fried chicken. Nuts and nut butters, in large amounts. Dairy Whole milk and chocolate milk. Sour cream. Cream. Ice cream. Cream cheese. Milkshakes. Fats and oils Butter. Margarine. Shortening. Ghee. Beverages Coffee and tea, with or without caffeine. Carbonated beverages. Sodas. Energy drinks. Fruit juice made with acidic fruits, such as  orange or grapefruit. Tomato juice. Alcoholic drinks. Sweets and desserts Chocolate and cocoa. Donuts. Seasonings and condiments Pepper. Peppermint and spearmint. Added salt. Any condiments, herbs, or seasonings that cause symptoms. For some people, this may include curry, hot sauce, or vinegar-based salad dressings. The items listed above may not be a complete list of what you should not eat and drink. Contact a food expert for more options. Questions to ask your doctor Diet and lifestyle changes are often the first steps that are taken to manage symptoms of GERD. If diet and lifestyle changes do not help, talk with your doctor about taking medicines. Where to find more information [employer] G[employer]intestinal [employer]: aboutgerd.org Summary When you have GERD, food and lifestyle choices are very important in easing your symptoms. Eat small meals often instead of 3 large meals a day. Eat your meals slowly and in a place where you are relaxed. Avoid bending over or lying down until 2-3 hours after eating. Limit high-fat foods such as fatty meats or fried foods. This information is not intended to replace advice given to you by your health care provider. Make sure you discuss any questions you have with your health care provider. Document Revised: 04/16/2020 Document Reviewed: 04/16/2020 Elsevier Patient Education  2022 [employer].

## 2021-08-23 LAB — H. PYLORI BREATH TEST: H. pylori Breath Test: NOT DETECTED

## 2021-09-15 ENCOUNTER — Other Ambulatory Visit: Payer: Self-pay

## 2021-09-15 ENCOUNTER — Encounter: Payer: Self-pay | Admitting: *Deleted

## 2021-09-15 ENCOUNTER — Ambulatory Visit
Admission: EM | Admit: 2021-09-15 | Discharge: 2021-09-15 | Disposition: A | Discharge status: Home / Self Care | Payer: Medicare Other | Attending: Physician Assistant | Admitting: Physician Assistant

## 2021-09-15 DIAGNOSIS — J019 Acute sinusitis, unspecified: Secondary | ICD-10-CM

## 2021-09-15 MED ORDER — AMOXICILLIN-POT CLAVULANATE 875-125 MG PO TABS: 1.0000 | ORAL_TABLET | Freq: Two times a day (BID) | ORAL | 0 refills | Status: DC

## 2021-09-15 NOTE — ED Provider Notes (Signed)
[facility]    CSN: [id number] Arrival date & time: 09/15/21  7371      History   Chief Complaint Chief Complaint  Patient presents with   Facial Pain    HPI Nancy Neal is a 87 y.o. female.   Patient here today for evaluation of nasal congestion and pressure that started about a week ago.  She reports that she has had symptoms wax and wane but that they seem to worsen over the last couple days.  She has developed fever and has had cough and sore throat.  She has tried over-the-counter medication with some relief but no resolution.  The history is provided by the patient.   Past Medical History:  Diagnosis Date   ALLERGIC RHINITIS 06/02/2007   Arthritis    "maybe in my right knee" (12/19/2015)   Coronary artery disease 11/2015   a. 11/2015: Inferior STEMI w/ Promus DES to RCA.     Diverticulosis of colon 2003   by 2003 and 2008 colonoscopy.    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   History of blood transfusion 1971   "when I had hysterectomy"   Myocardial infarction (HCC) 11/2015   OSTEOPENIA 06/02/2007   VAGINITIS, ATROPHIC, POSTMENOPAUSAL 12/06/2008   VERTIGO 02/16/2009    Patient Active Problem List   Diagnosis Date Noted   Urge and stress incontinence 07/04/2021   Diverticulitis of intestine without perforation or abscess    GI bleeding 12/19/2015   CAD S/P PCI 12/15/15 12/17/2015   Coronary artery disease involving native coronary artery of native heart with angina pectoris (HCC) 12/17/2015   ST elevation (STEMI) myocardial infarction involving other coronary artery of inferior wall (HCC) 12/15/2015   Hyperlipidemia 12/15/2015   Nuclear sclerosis of both eyes 07/03/2014   Primary open angle glaucoma of both eyes, indeterminate stage 07/03/2014   VERTIGO 02/16/2009   VAGINITIS, ATROPHIC, POSTMENOPAUSAL 12/06/2008   Allergic rhinitis 06/02/2007   Disorder of bone and cartilage 06/02/2007    Past Surgical History:   Procedure Laterality Date   APPENDECTOMY  1971   CARDIAC CATHETERIZATION N/A 12/15/2015   Procedure: Left Heart Cath and Coronary Angiography;  Surgeon: Lyn Records, MD;  Location: [facility] INVASIVE CV LAB;  Service: Cardiovascular;  Laterality: N/A;   CARDIAC CATHETERIZATION N/A 12/15/2015   Procedure: Coronary Stent Intervention;  Surgeon: Lyn Records, MD;  Location: [facility] INVASIVE CV LAB;  Service: Cardiovascular;  Laterality: N/A;   COLONOSCOPY N/A 12/20/2015   Procedure: COLONOSCOPY;  Surgeon: Hilarie Fredrickson, MD;  Location: [facility] ENDOSCOPY;  Service: Endoscopy;  Laterality: N/A;   EYE SURGERY Bilateral    "laser tx for glaucoma"   HEMORRHOID SURGERY  ~ 1968   SHOULDER ARTHROSCOPY W/ ROTATOR CUFF REPAIR Right 11/2014   TOTAL ABDOMINAL HYSTERECTOMY  1971    OB History   No obstetric history on file.      Home Medications    Prior to Admission medications   Medication Sig Start Date End Date Taking? Authorizing Provider  amoxicillin-clavulanate (AUGMENTIN) 875-125 MG tablet Take 1 tablet by mouth every 12 (twelve) hours. 09/15/21  Yes Tomi Bamberger, PA-C  Ascorbic Acid (VITAMIN C ER PO) Take 1 tablet by mouth daily.   Yes [provider]  aspirin 81 MG chewable tablet Chew 1 tablet (81 mg total) by mouth daily. 12/17/15  Yes Kilroy, Luke K, PA-C  atorvastatin (LIPITOR) 10 MG tablet TAKE 1 TABLET(10 MG) BY MOUTH DAILY Patient  taking differently: Take 10 mg by mouth daily. 02/28/21  Yes Lyn Records, MD  ergocalciferol (VITAMIN D2) 1.25 MG (50000 UT) capsule Take 50,000 Units by mouth once a week.   Yes [provider]  MAGNESIUM PO Take by mouth.   Yes [provider]  Multiple Vitamin (MULTIVITAMIN WITH MINERALS) TABS tablet Take 1 tablet by mouth daily.   Yes [provider]  vitamin E 1000 UNIT capsule Take 1,000 Units by mouth daily.   Yes [provider]  acetaminophen (TYLENOL) 325 MG tablet Take 2 tablets (650 mg total) by mouth every 4  (four) hours as needed for headache or mild pain. 12/17/15   Abelino Derrick, PA-C  carboxymethylcellulose (REFRESH PLUS) 0.5 % SOLN Place 1 drop into both eyes 3 (three) times daily as needed (dry eyes).     [provider]  pantoprazole (PROTONIX) 20 MG tablet 1tab BID before meals x 1week, then 1tab daily before breakfast 08/22/21   Nche, Bonna Gains, NP    Family History Family History  Problem Relation Age of Onset   Suicidality Mother    Heart disease Father    Arthritis Brother    Breast cancer Neg Hx     Social History Social History   Tobacco Use   Smoking status: Never    Passive exposure: Past   Smokeless tobacco: Never  Vaping Use   Vaping Use: Never used  Substance Use Topics   Alcohol use: Not Currently    Comment: sporadically   Drug use: No     Allergies   Codeine sulfate, Meclizine, Acetaminophen, Morphine, Other, and Tramadol   Review of Systems Review of Systems  Constitutional:  Positive for fever.  HENT:  Positive for congestion and sinus pressure. Negative for ear pain and sore throat.   Eyes:  Negative for discharge and redness.  Respiratory:  Positive for cough. Negative for shortness of breath and wheezing.   Gastrointestinal:  Negative for abdominal pain, diarrhea, nausea and vomiting.    Physical Exam Triage Vital Signs ED Triage Vitals  Enc Vitals Group     BP 09/15/21 0903 137/76     Pulse Rate 09/15/21 0903 90     Resp 09/15/21 0903 20     Temp 09/15/21 0903 97.6 F (36.4 C)     Temp Source 09/15/21 0903 Temporal     SpO2 09/15/21 0903 93 %     Weight --      Height --      Head Circumference --      Peak Flow --      Pain Score 09/15/21 0904 4     Pain Loc --      Pain Edu? --      Excl. in GC? --    No data found.  Updated Vital Signs BP 137/76   Pulse 90   Temp 98.1 F (36.7 C) (Oral)   Resp 20   SpO2 93%     Physical Exam Vitals and nursing note reviewed.  Constitutional:      General: She is not in  acute distress.    Appearance: Normal appearance. She is not ill-appearing.  HENT:     Head: Normocephalic and atraumatic.     Nose: Congestion present.     Mouth/Throat:     Mouth: Mucous membranes are moist.     Pharynx: No oropharyngeal exudate or posterior oropharyngeal erythema.  Eyes:     Conjunctiva/sclera: Conjunctivae normal.  Cardiovascular:  Rate and Rhythm: Normal rate and regular rhythm.     Heart sounds: Normal heart sounds. No murmur heard. Pulmonary:     Effort: Pulmonary effort is normal. No respiratory distress.     Breath sounds: Normal breath sounds. No wheezing, rhonchi or rales.  Skin:    General: Skin is warm and dry.  Neurological:     Mental Status: She is alert.  Psychiatric:        Mood and Affect: Mood normal.        Thought Content: Thought content normal.     UC Treatments / Results  Labs (all labs ordered are listed, but only abnormal results are displayed) Labs Reviewed - No data to display  EKG   Radiology No results found.  Procedures Procedures (including critical care time)  Medications Ordered in UC Medications - No data to display  Initial Impression / Assessment and Plan / UC Course  I have reviewed the triage vital signs and the nursing notes.  Pertinent labs & imaging results that were available during my care of the patient were reviewed by me and considered in my medical decision making (see chart for details).    Will treat to cover sinusitis with Augmentin.  Patient was offered screening for COVID and flu which she declines.  Recommended follow-up if symptoms fail to improve or worsen.  Final Clinical Impressions(s) / UC Diagnoses   Final diagnoses:  Acute sinusitis, recurrence not specified, unspecified location   Discharge Instructions   None    ED Prescriptions     Medication Sig Dispense Auth. Provider   amoxicillin-clavulanate (AUGMENTIN) 875-125 MG tablet Take 1 tablet by mouth every 12 (twelve)  hours. 14 tablet Tomi Bamberger, PA-C      PDMP not reviewed this encounter.   Tomi Bamberger, PA-C 09/15/21 1045

## 2021-09-15 NOTE — ED Triage Notes (Addendum)
C/O sinus pain and pressure with chills, fevers up to 101 with cough and sore throat.  Started taking Sudafed yesterday along with ASA. Pt declining Covid and influenza test.

## 2021-10-07 ENCOUNTER — Encounter: Payer: Self-pay | Admitting: Nurse Practitioner

## 2021-10-07 ENCOUNTER — Ambulatory Visit (INDEPENDENT_AMBULATORY_CARE_PROVIDER_SITE_OTHER): Payer: Medicare Other | Admitting: Nurse Practitioner

## 2021-10-07 ENCOUNTER — Other Ambulatory Visit: Payer: Self-pay

## 2021-10-07 VITALS — BP 126/60 | HR 86 | Temp 96.6°F | Ht 64.0 in | Wt 134.4 lb

## 2021-10-07 DIAGNOSIS — E782 Mixed hyperlipidemia: Secondary | ICD-10-CM

## 2021-10-07 DIAGNOSIS — M810 Age-related osteoporosis without current pathological fracture: Secondary | ICD-10-CM | POA: Insufficient documentation

## 2021-10-07 DIAGNOSIS — Z0000 Encounter for general adult medical examination without abnormal findings: Secondary | ICD-10-CM | POA: Diagnosis not present

## 2021-10-07 DIAGNOSIS — M8589 Other specified disorders of bone density and structure, multiple sites: Secondary | ICD-10-CM | POA: Insufficient documentation

## 2021-10-07 DIAGNOSIS — H9192 Unspecified hearing loss, left ear: Secondary | ICD-10-CM | POA: Insufficient documentation

## 2021-10-07 LAB — COMPREHENSIVE METABOLIC PANEL
ALT: 15 U/L (ref 0–35)
AST: 19 U/L (ref 0–37)
Albumin: 4.1 g/dL (ref 3.5–5.2)
Alkaline Phosphatase: 69 U/L (ref 39–117)
BUN: 14 mg/dL (ref 6–23)
CO2: 28 mEq/L (ref 19–32)
Calcium: 9.4 mg/dL (ref 8.4–10.5)
Chloride: 104 mEq/L (ref 96–112)
Creatinine, Ser: 0.92 mg/dL (ref 0.40–1.20)
GFR: 56.12 mL/min — ABNORMAL LOW (ref >60.00)
Glucose, Bld: 88 mg/dL (ref 70–99)
Potassium: 4.5 mEq/L (ref 3.5–5.1)
Sodium: 139 mEq/L (ref 135–145)
Total Bilirubin: 1.3 mg/dL — ABNORMAL HIGH (ref 0.2–1.2)
Total Protein: 6.4 g/dL (ref 6.0–8.3)

## 2021-10-07 LAB — LIPID PANEL
Cholesterol: 148 mg/dL (ref 0–200)
HDL: 58.8 mg/dL (ref >39.00)
LDL Cholesterol: 64 mg/dL (ref 0–99)
NonHDL: 89.18
Total CHOL/HDL Ratio: 3
Triglycerides: 128 mg/dL (ref 0.0–149.0)
VLDL: 25.6 mg/dL (ref 0.0–40.0)

## 2021-10-07 LAB — TSH: TSH: 1.81 u[IU]/mL (ref 0.35–5.50)

## 2021-10-07 NOTE — Assessment & Plan Note (Signed)
Chronic per Ms. Echeverria

## 2021-10-07 NOTE — Progress Notes (Signed)
Subjective:  Patient ID: Nancy Neal, female    DOB: 1932-04-20  Age: 87 y.o. MRN: [id number]  CC: Establish Care (TOC-Dr. C/AWV/Pt is fasting. )  HPI Here for medicare wellness, no new complaints. Please see A/P for status and treatment of chronic medical problems.   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 Denies need for hearing aid at this time Visual acuity: grossly normal, performs annual eye exam  ADLs: capable Fall risk: none Home safety: good Cognitive evaluation: intact to orientation, naming, recall and repetition EOL planning: adv directives discussed  I have personally reviewed and have noted 1. The patient's medical and social history - reviewed today no changes 2. Their use of alcohol, tobacco or illicit drugs 3. Their current medications and supplements 4. The patient's functional ability including ADL's, fall risks, home safety risks and hearing or visual impairment. 5. Diet and physical activities 6. Evidence for depression or mood disorders 7. Care team reviewed and updated (available in snapshot)   Nancy Neal wants to continue mammogram annually. She is Scheduled for repeat in 11/2021 She has an appt annually with dermatology. She has dental cleaning completed every 74months Memory exercise:Plays bridge 3hrs 3x/week, reading MMSE - Mini Mental State Exam 10/07/2021  Orientation to time 5  Orientation to Place 5  Registration 3  Attention/ Calculation 5  Recall 2  Language- name 2 objects 2  Language- repeat 1  Language- follow 3 step command 3  Language- read & follow direction 1  Write a sentence 1  Copy design 1  Total score 29    Hearing Screening  Method: Audiometry   125Hz  250Hz  500Hz  1000Hz  2000Hz  3000Hz  4000Hz  5000Hz  6000Hz  8000Hz   Right ear 45 45 45 45 45 45 45 45 45 45   Left ear >50 >50 >50 >50 >50 >50 >50 >50 >50 >50     Depression screen [facility] 2/9 10/07/2021 10/07/2021 07/04/2021  Decreased Interest 0 0 0  Down, Depressed, Hopeless 0 0 0  PHQ - 2 Score 0 0 0    GAD 7 : Generalized Anxiety Score 10/07/2021 07/04/2021  Nervous, Anxious, on Edge 0 0  Control/stop worrying 3 0  Worry too much - different things 1 0  Trouble relaxing 0 0  Restless 0 0  Easily annoyed or irritable 1 0  Afraid - awful might happen 0 0  Total GAD 7 Score 5 0  Anxiety Difficulty Not difficult at all -   Fall Risk 06/16/2021 06/26/2021 07/04/2021 09/15/2021 10/07/2021  Falls in the past year? - - 0 - 0  Was there an injury with Fall? - - 0 - 0  Fall Risk Category Calculator - - 0 - 0  Fall Risk Category - - Low - Low  Patient Fall Risk Level Low fall risk Low fall risk Low fall risk Low fall risk Low fall risk  Patient at Risk for Falls Due to - - No Fall Risks - No Fall Risks  Fall risk Follow up - - Falls evaluation completed - Falls evaluation completed    Reviewed past Medical, Social and Family history today.  Outpatient Medications Prior to Visit  Medication Sig Dispense Refill   acetaminophen (TYLENOL) 325 MG tablet Take 2 tablets (650 mg total) by mouth every 4 (four) hours as needed for headache or mild pain.     Ascorbic Acid (VITAMIN C ER PO) Take 1 tablet by mouth daily.  aspirin 81 MG chewable tablet Chew 1 tablet (81 mg total) by mouth daily.     atorvastatin (LIPITOR) 10 MG tablet TAKE 1 TABLET(10 MG) BY MOUTH DAILY (Patient taking differently: Take 10 mg by mouth daily.) 90 tablet 3   carboxymethylcellulose (REFRESH PLUS) 0.5 % SOLN Place 1 drop into both eyes 3 (three) times daily as needed (dry eyes).      ergocalciferol (VITAMIN D2) 1.25 MG (50000 UT) capsule Take 50,000 Units by mouth once a week.     MAGNESIUM PO Take by mouth.     Multiple Vitamin (MULTIVITAMIN WITH MINERALS) TABS tablet Take 1 tablet by mouth daily.     vitamin E 1000 UNIT capsule Take 1,000 Units by mouth daily.      amoxicillin-clavulanate (AUGMENTIN) 875-125 MG tablet Take 1 tablet by mouth every 12 (twelve) hours. (Patient not taking: Reported on 10/07/2021) 14 tablet 0   pantoprazole (PROTONIX) 20 MG tablet 1tab BID before meals x 1week, then 1tab daily before breakfast (Patient not taking: Reported on 10/07/2021) 28 tablet 0   No facility-administered medications prior to visit.   ROS See HPI  Objective:  BP 126/60 (BP Location: Left Arm, Patient Position: Sitting, Cuff Size: Normal)    Pulse 86    Temp (!) 96.6 F (35.9 C) (Temporal)    Ht 5\' 4"  (1.626 m)    Wt 134 lb 6.4 oz (61 kg)    SpO2 99%    BMI 23.07 kg/m   Physical Exam Vitals reviewed.  HENT:     Right Ear: Tympanic membrane, ear canal and external ear normal.     Left Ear: Tympanic membrane, ear canal and external ear normal.  Eyes:     Extraocular Movements: Extraocular movements intact.     Conjunctiva/sclera: Conjunctivae normal.     Pupils: Pupils are equal, round, and reactive to light.  Cardiovascular:     Rate and Rhythm: Normal rate and regular rhythm.     Pulses: Normal pulses.     Heart sounds: Normal heart sounds.  Pulmonary:     Effort: Pulmonary effort is normal.     Breath sounds: Normal breath sounds.  Abdominal:     General: Bowel sounds are normal.     Palpations: Abdomen is soft.  Musculoskeletal:     Cervical back: Normal range of motion and neck supple.     Right lower leg: No edema.     Left lower leg: No edema.  Lymphadenopathy:     Cervical: No cervical adenopathy.  Skin:    General: Skin is warm and dry.  Neurological:     Mental Status: She is alert and oriented to person, place, and time.     Cranial Nerves: No cranial nerve deficit.     Motor: No weakness.     Coordination: Coordination normal.     Gait: Gait normal.     Deep Tendon Reflexes: Reflexes normal.  Psychiatric:        Mood and Affect: Mood normal.        Behavior: Behavior normal.        Thought Content: Thought content  normal.    Assessment & Plan:  This visit occurred during the SARS-CoV-2 public health emergency.  Safety protocols were in place, including screening questions prior to the visit, additional usage of staff PPE, and extensive cleaning of exam room while observing appropriate contact time as indicated for disinfecting solutions.   Nancy Neal was seen today for establish care.  Diagnoses and  all orders for this visit:  Encounter for subsequent annual wellness visit (AWV) in Medicare patient  Osteopenia of multiple sites  Mixed hyperlipidemia -     Comprehensive metabolic panel -     Lipid panel -     TSH  Nancy Neal , Thank you for taking time to come for your Medicare Wellness Visit. I appreciate your ongoing commitment to your health goals. Please review the following plan we discussed and let me know if I can assist you in the future.   These are the goals we discussed: Continue daily exercise, reading and playing bridge. Please bring a copy of you living will and healthcare power of attorney. Maintain appt for bone density and mammogram.   This is a list of the screening recommended for you and due dates:  Health Maintenance  Topic Date Due   COVID-19 Vaccine (5 - Booster for Moderna series) 05/11/2021   Tetanus Vaccine  10/01/2028   Pneumonia Vaccine  Completed   Flu Shot  Completed   DEXA scan (bone density measurement)  Completed   Zoster (Shingles) Vaccine  Completed   HPV Vaccine  Aged Out   Problem List Items Addressed This Visit       Musculoskeletal and Integument   Osteopenia of multiple sites    Last dexa scan 2004 Current use of calcium and vitamin D supplement daily Scheduled for repeat dexa scan 11/2021        Other   Hyperlipidemia    Repeat lipid panel Current use of lipitor      Relevant Orders   Comprehensive metabolic panel   Lipid panel   TSH   Other Visit Diagnoses     Encounter for subsequent annual wellness visit (AWV) in Medicare patient     -  Primary       Follow-up: Return in about 1 year (around 10/07/2022), or AWV with wellness coach, for hyperlipidemia (fasting).  Alysia Penna, NP

## 2021-10-07 NOTE — Assessment & Plan Note (Addendum)
Last dexa scan 2004 Current use of calcium and vitamin D supplement daily Scheduled for repeat dexa scan 11/2021

## 2021-10-07 NOTE — Assessment & Plan Note (Signed)
Repeat lipid panel Current use of lipitor

## 2021-10-07 NOTE — Patient Instructions (Addendum)
Go to lab for blood draw.  Nancy Neal , Thank you for taking time to come for your Medicare Wellness Visit. I appreciate your ongoing commitment to your health goals. Please review the following plan we discussed and let me know if I can assist you in the future.   These are the goals we discussed: Continue daily exercise, reading and playing bridge. Please bring a copy of you living will and healthcare power of attorney. Maintain appt for bone density and mammogram.   This is a list of the screening recommended for you and due dates:  Health Maintenance  Topic Date Due   COVID-19 Vaccine (5 - Booster for Moderna series) 05/11/2021   Tetanus Vaccine  10/01/2028   Pneumonia Vaccine  Completed   Flu Shot  Completed   DEXA scan (bone density measurement)  Completed   Zoster (Shingles) Vaccine  Completed   HPV Vaccine  Aged Out

## 2021-10-18 ENCOUNTER — Ambulatory Visit: Payer: Federal, State, Local not specified - PPO | Admitting: Family Medicine

## 2021-10-18 ENCOUNTER — Emergency Department (HOSPITAL_COMMUNITY): Payer: Medicare Other

## 2021-10-18 ENCOUNTER — Other Ambulatory Visit: Payer: Self-pay

## 2021-10-18 ENCOUNTER — Emergency Department (HOSPITAL_COMMUNITY)
Admission: EM | Admit: 2021-10-18 | Discharge: 2021-10-18 | Disposition: A | Discharge status: Home / Self Care | Payer: Medicare Other | Attending: Emergency Medicine | Admitting: Emergency Medicine

## 2021-10-18 ENCOUNTER — Ambulatory Visit: Payer: Federal, State, Local not specified - PPO | Admitting: Family

## 2021-10-18 ENCOUNTER — Encounter (HOSPITAL_COMMUNITY): Payer: Self-pay | Admitting: Emergency Medicine

## 2021-10-18 DIAGNOSIS — K769 Liver disease, unspecified: Secondary | ICD-10-CM | POA: Diagnosis not present

## 2021-10-18 DIAGNOSIS — K5790 Diverticulosis of intestine, part unspecified, without perforation or abscess without bleeding: Secondary | ICD-10-CM | POA: Diagnosis not present

## 2021-10-18 DIAGNOSIS — R072 Precordial pain: Secondary | ICD-10-CM | POA: Diagnosis not present

## 2021-10-18 DIAGNOSIS — I700 Atherosclerosis of aorta: Secondary | ICD-10-CM | POA: Diagnosis not present

## 2021-10-18 DIAGNOSIS — Z20822 Contact with and (suspected) exposure to covid-19: Secondary | ICD-10-CM | POA: Insufficient documentation

## 2021-10-18 DIAGNOSIS — Z7982 Long term (current) use of aspirin: Secondary | ICD-10-CM | POA: Insufficient documentation

## 2021-10-18 DIAGNOSIS — R197 Diarrhea, unspecified: Secondary | ICD-10-CM | POA: Diagnosis not present

## 2021-10-18 DIAGNOSIS — K219 Gastro-esophageal reflux disease without esophagitis: Secondary | ICD-10-CM | POA: Diagnosis not present

## 2021-10-18 DIAGNOSIS — I2510 Atherosclerotic heart disease of native coronary artery without angina pectoris: Secondary | ICD-10-CM | POA: Diagnosis not present

## 2021-10-18 DIAGNOSIS — R1013 Epigastric pain: Secondary | ICD-10-CM | POA: Diagnosis not present

## 2021-10-18 DIAGNOSIS — J9811 Atelectasis: Secondary | ICD-10-CM | POA: Diagnosis not present

## 2021-10-18 DIAGNOSIS — Z7722 Contact with and (suspected) exposure to environmental tobacco smoke (acute) (chronic): Secondary | ICD-10-CM | POA: Diagnosis not present

## 2021-10-18 DIAGNOSIS — R0789 Other chest pain: Secondary | ICD-10-CM | POA: Diagnosis not present

## 2021-10-18 DIAGNOSIS — M4316 Spondylolisthesis, lumbar region: Secondary | ICD-10-CM | POA: Diagnosis not present

## 2021-10-18 DIAGNOSIS — R0602 Shortness of breath: Secondary | ICD-10-CM | POA: Diagnosis not present

## 2021-10-18 DIAGNOSIS — K559 Vascular disorder of intestine, unspecified: Secondary | ICD-10-CM | POA: Diagnosis not present

## 2021-10-18 DIAGNOSIS — R079 Chest pain, unspecified: Secondary | ICD-10-CM | POA: Diagnosis not present

## 2021-10-18 LAB — COMPREHENSIVE METABOLIC PANEL
ALT: 20 U/L (ref 0–44)
AST: 26 U/L (ref 15–41)
Albumin: 3.9 g/dL (ref 3.5–5.0)
Alkaline Phosphatase: 71 U/L (ref 38–126)
Anion gap: 8 (ref 5–15)
BUN: 22 mg/dL (ref 8–23)
CO2: 19 mmol/L — ABNORMAL LOW (ref 22–32)
Calcium: 9.3 mg/dL (ref 8.9–10.3)
Chloride: 110 mmol/L (ref 98–111)
Creatinine, Ser: 0.81 mg/dL (ref 0.44–1.00)
GFR, Estimated: >60 mL/min (ref >60)
Glucose, Bld: 128 mg/dL — ABNORMAL HIGH (ref 70–99)
Potassium: 4.1 mmol/L (ref 3.5–5.1)
Sodium: 137 mmol/L (ref 135–145)
Total Bilirubin: 1.5 mg/dL — ABNORMAL HIGH (ref 0.3–1.2)
Total Protein: 6.5 g/dL (ref 6.5–8.1)

## 2021-10-18 LAB — TROPONIN I (HIGH SENSITIVITY)
Troponin I (High Sensitivity): 5 ng/L (ref <18)
Troponin I (High Sensitivity): 5 ng/L (ref <18)

## 2021-10-18 LAB — URINALYSIS, ROUTINE W REFLEX MICROSCOPIC
Bilirubin Urine: NEGATIVE
Glucose, UA: NEGATIVE mg/dL
Hgb urine dipstick: NEGATIVE
Ketones, ur: NEGATIVE mg/dL
Leukocytes,Ua: NEGATIVE
Nitrite: NEGATIVE
Protein, ur: NEGATIVE mg/dL
Specific Gravity, Urine: 1.021 (ref 1.005–1.030)
pH: 5 (ref 5.0–8.0)

## 2021-10-18 LAB — RESP PANEL BY RT-PCR (FLU A&B, COVID) ARPGX2
Influenza A by PCR: NEGATIVE
Influenza B by PCR: NEGATIVE
SARS Coronavirus 2 by RT PCR: NEGATIVE

## 2021-10-18 LAB — LIPASE, BLOOD: Lipase: 36 U/L (ref 11–51)

## 2021-10-18 LAB — LACTIC ACID, PLASMA: Lactic Acid, Venous: 1 mmol/L (ref 0.5–1.9)

## 2021-10-18 MED ORDER — ALUM & MAG HYDROXIDE-SIMETH 200-200-20 MG/5ML PO SUSP: 30.0000 mL | Freq: Once | ORAL | Status: DC

## 2021-10-18 MED ORDER — FAMOTIDINE 20 MG PO TABS: 20.0000 mg | ORAL_TABLET | Freq: Two times a day (BID) | ORAL | 0 refills | Status: DC

## 2021-10-18 MED ORDER — IOHEXOL 350 MG/ML SOLN
100.0000 mL | Freq: Once | INTRAVENOUS | Status: AC | PRN
  Administered 2021-10-18: 15:00:00 100 mL via INTRAVENOUS

## 2021-10-18 MED ORDER — LIDOCAINE VISCOUS HCL 2 % MT SOLN: 15.0000 mL | Freq: Once | OROMUCOSAL | Status: DC

## 2021-10-18 MED ORDER — ONDANSETRON HCL 4 MG/2ML IJ SOLN
4.0000 mg | Freq: Once | INTRAMUSCULAR | Status: AC
  Administered 2021-10-18: 14:00:00 4 mg via INTRAVENOUS
  Filled 2021-10-18: qty 2

## 2021-10-18 MED ORDER — FENTANYL CITRATE PF 50 MCG/ML IJ SOSY
25.0000 ug | PREFILLED_SYRINGE | Freq: Once | INTRAMUSCULAR | Status: AC
  Administered 2021-10-18: 14:00:00 25 ug via INTRAVENOUS
  Filled 2021-10-18: qty 1

## 2021-10-18 MED ORDER — PANTOPRAZOLE SODIUM 40 MG PO TBEC: 40.0000 mg | DELAYED_RELEASE_TABLET | Freq: Every day | ORAL | 0 refills | Status: DC

## 2021-10-18 MED ORDER — LACTATED RINGERS IV BOLUS
1000.0000 mL | Freq: Once | INTRAVENOUS | Status: AC
  Administered 2021-10-18: 14:00:00 1000 mL via INTRAVENOUS

## 2021-10-18 NOTE — ED Provider Notes (Signed)
Emergency Medicine Provider Triage Evaluation Note  Nancy Neal , a 87 y.o. female  was evaluated in triage.  Pt complains of chest pain and abdominal pain starting last night.  Patient states that her pain feels similar to the last time she had an MI in 2017.  She is complaining of nausea, sweating, and loose stools.  She initially thought that she was having a "GI issue".  When asked to describe her pain she just describes it "hurting".  Review of Systems  Positive: Chest pain, abdominal pain, nausea, diaphoresis, diarrhea Negative: Fever, chills, cough, shortness of breath, vomiting  Physical Exam  BP (!) 138/96 (BP Location: Right Arm)    Pulse 84    Temp 98.4 F (36.9 C) (Oral)    Resp 16    SpO2 99%  Gen:   Awake, no distress   Resp:  Normal effort  MSK:   Moves extremities without difficulty  Other:  Patient appears pale and diaphoretic  Medical Decision Making  Medically screening exam initiated at 9:57 AM.  Appropriate orders placed.  Nancy Neal was informed that the remainder of the evaluation will be completed by another provider, this initial triage assessment does not replace that evaluation, and the importance of remaining in the ED until their evaluation is complete.  Will obtain labs to rule out ACS   Nancy Neal 10/18/21 [id number]    Nancy Avena, MD 10/18/21 1224

## 2021-10-18 NOTE — ED Provider Notes (Signed)
[facility] EMERGENCY DEPARTMENT Provider Note   CSN: [id number] Arrival date & time: 10/18/21  [id number]     History Chief Complaint  Patient presents with   Chest Pain    Nancy Neal is a 87 y.o. female.   Chest Pain Associated symptoms: nausea    87 year old female with past medical history significant for coronary artery disease, GERD, HLD who presents to the ED with substernal and epigastric chest and abdominal pain. The patient states that she has a history of GERD and sleeps propped up partially to minimize episodes of nightly discomfort. She also has a history of STEMI and felt that she had similar symptoms compared to her prior MI in 2017. She felt nausea, diaphoresis, and has had a few episodes of loose stools. No fevers or chills. Her pain is described as a dull ache, no aggravating or alleviating factors, associated with nausea. She states that she has no exertional chest pain and in her cardiology follow-up visits had been described as "doing well." She did take a nitroglycerin prior to arrival. Her pain is now completely resolved.    Past Medical History:  Diagnosis Date   ALLERGIC RHINITIS 06/02/2007   Arthritis    "maybe in my right knee" (12/19/2015)   Coronary artery disease 11/2015   a. 11/2015: Inferior STEMI w/ Promus DES to RCA.     Diverticulosis of colon 2003   by 2003 and 2008 colonoscopy.    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   History of blood transfusion 1971   "when I had hysterectomy"   Myocardial infarction ([place]) 11/2015   OSTEOPENIA 06/02/2007   VAGINITIS, ATROPHIC, POSTMENOPAUSAL 12/06/2008   VERTIGO 02/16/2009    Patient Active Problem List   Diagnosis Date Noted   Hearing loss in left ear 10/07/2021   Osteopenia of multiple sites 10/07/2021   Urge and stress incontinence 07/04/2021   History of diverticulitis of colon    CAD S/P PCI 12/15/15 12/17/2015   Coronary artery disease involving native  coronary artery of native heart with angina pectoris ([place]) 12/17/2015   ST elevation (STEMI) myocardial infarction involving other coronary artery of inferior wall ([place]) 12/15/2015   Hyperlipidemia 12/15/2015   Nuclear sclerosis of both eyes 07/03/2014   Primary open angle glaucoma of both eyes, indeterminate stage 07/03/2014   VERTIGO 02/16/2009   VAGINITIS, ATROPHIC, POSTMENOPAUSAL 12/06/2008   Allergic rhinitis 06/02/2007    Past Surgical History:  Procedure Laterality Date   APPENDECTOMY  1971   CARDIAC CATHETERIZATION N/A 12/15/2015   Procedure: Left Heart Cath and Coronary Angiography;  Surgeon: Belva Crome, MD;  Location: [place] [facility];  Service: Cardiovascular;  Laterality: N/A;   CARDIAC CATHETERIZATION N/A 12/15/2015   Procedure: Coronary Stent Intervention;  Surgeon: Belva Crome, MD;  Location: [place] [facility];  Service: Cardiovascular;  Laterality: N/A;   COLONOSCOPY N/A 12/20/2015   Procedure: COLONOSCOPY;  Surgeon: Irene Shipper, MD;  Location: [place];  Service: Endoscopy;  Laterality: N/A;   EYE SURGERY Bilateral    "laser tx for glaucoma"   HEMORRHOID SURGERY  ~ [place] ARTHROSCOPY W/ ROTATOR CUFF REPAIR Right 11/2014   TOTAL ABDOMINAL HYSTERECTOMY  1971     OB History   No obstetric history on file.     Family History  Problem Relation Age of Onset   Suicidality Mother    Heart disease Father    Arthritis Brother  Breast cancer Neg Hx     Social History   Tobacco Use   Smoking status: Never    Passive exposure: Past   Smokeless tobacco: Never  Vaping Use   Vaping Use: Never used  Substance Use Topics   Alcohol use: Not Currently    Comment: sporadically   Drug use: No    Home Medications Prior to Admission medications   Medication Sig Start Date End Date Taking? Authorizing Provider  Ascorbic Acid (VITAMIN C ER PO) Take 1 tablet by mouth daily.   Yes [provider]  atorvastatin (LIPITOR) 10 MG tablet TAKE 1  TABLET(10 MG) BY MOUTH DAILY Patient taking differently: Take 10 mg by mouth daily. 02/28/21  Yes Belva Crome, MD  ergocalciferol (VITAMIN D2) 1.25 MG (50000 UT) capsule Take 50,000 Units by mouth once a week.   Yes [provider]  famotidine (PEPCID) 20 MG tablet Take 1 tablet (20 mg total) by mouth 2 (two) times daily. 10/18/21  Yes Regan Lemming, MD  MAGNESIUM PO Take 1 tablet by mouth daily.   Yes [provider]  Multiple Vitamin (MULTIVITAMIN WITH MINERALS) TABS tablet Take 1 tablet by mouth daily.   Yes [provider]  pantoprazole (PROTONIX) 40 MG tablet Take 1 tablet (40 mg total) by mouth daily. 10/18/21 11/17/21 Yes Regan Lemming, MD  vitamin E 1000 UNIT capsule Take 1,000 Units by mouth daily.   Yes [provider]  acetaminophen (TYLENOL) 325 MG tablet Take 2 tablets (650 mg total) by mouth every 4 (four) hours as needed for headache or mild pain. 12/17/15   Erlene Quan, PA-C  aspirin 81 MG chewable tablet Chew 1 tablet (81 mg total) by mouth daily. 12/17/15   Erlene Quan, PA-C  carboxymethylcellulose (REFRESH PLUS) 0.5 % SOLN Place 1 drop into both eyes 3 (three) times daily as needed (dry eyes).     [provider]    Allergies    Codeine sulfate, Meclizine, Acetaminophen, Morphine, Other, and Tramadol  Review of Systems   Review of Systems  Cardiovascular:  Positive for chest pain.  Gastrointestinal:  Positive for diarrhea and nausea.  All other systems reviewed and are negative.  Physical Exam Updated Vital Signs BP 132/76    Pulse 72    Temp 98.2 F (36.8 C) (Oral)    Resp 15    Ht 5\' 4"  (1.626 m)    Wt 61.2 kg    SpO2 97%    BMI 23.17 kg/m   Physical Exam Vitals and nursing note reviewed.  Constitutional:      General: She is not in acute distress.    Appearance: She is well-developed.  HENT:     Head: Normocephalic and atraumatic.  Eyes:     Conjunctiva/sclera: Conjunctivae normal.     Pupils: Pupils are  equal, round, and reactive to light.  Cardiovascular:     Rate and Rhythm: Normal rate and regular rhythm.     Heart sounds: No murmur heard. Pulmonary:     Effort: Pulmonary effort is normal. No respiratory distress.     Breath sounds: Normal breath sounds.  Abdominal:     General: There is no distension.     Palpations: Abdomen is soft.     Tenderness: There is generalized abdominal tenderness and tenderness in the epigastric area. There is no guarding or rebound.     Comments: Mild focal epigastric tenderness to palpation, some generalized abdominal tenderness without rebound or guarding  Musculoskeletal:  General: No swelling, deformity or signs of injury.     Cervical back: Neck supple.  Skin:    General: Skin is warm and dry.     Capillary Refill: Capillary refill takes less than 2 seconds.     Findings: No lesion or rash.  Neurological:     General: No focal deficit present.     Mental Status: She is alert. Mental status is at baseline.  Psychiatric:        Mood and Affect: Mood normal.    ED Results / Procedures / Treatments   Labs (all labs ordered are listed, but only abnormal results are displayed) Labs Reviewed  COMPREHENSIVE METABOLIC PANEL - Abnormal; Notable for the following components:      Result Value   CO2 19 (*)    Glucose, Bld 128 (*)    Total Bilirubin 1.5 (*)    All other components within normal limits  RESP PANEL BY RT-PCR (FLU A&B, COVID) ARPGX2  LIPASE, BLOOD  URINALYSIS, ROUTINE W REFLEX MICROSCOPIC  LACTIC ACID, PLASMA  TROPONIN I (HIGH SENSITIVITY)  TROPONIN I (HIGH SENSITIVITY)    EKG EKG Interpretation  Date/Time:  Friday October 18 2021 09:52:18 EST Ventricular Rate:  81 PR Interval:  156 QRS Duration: 68 QT Interval:  376 QTC Calculation: 436 R Axis:   -11 Text Interpretation: Normal sinus rhythm Inferior infarct , age undetermined Possible Anterior infarct , age undetermined Abnormal ECG When compared with ECG of  29-Feb-2016 06:00, PREVIOUS ECG IS PRESENT Confirmed by Ernie Avena ([id number]) on 10/18/2021 12:37:44 PM  Radiology DG Chest 2 View  Result Date: 10/18/2021 CLINICAL DATA:  Shortness of breath and chest pain. EXAM: CHEST - 2 VIEW COMPARISON:  Feb 28, 2016 FINDINGS: Lungs are hyperinflated. Minimal hazy opacity is identified in the right upper to mid lung. Minor atelectasis of left lung base is noted. The mediastinal contour and cardiac silhouette are stable. The aorta is tortuous. Scoliosis of the spine is noted. IMPRESSION: Minimal hazy opacity identified in the right upper to mid lung, developing pneumonia is not excluded. Electronically Signed   By: Sherian Rein M.D.   On: 10/18/2021 10:13   CT Angio Chest/Abd/Pel for Dissection W and/or Wo Contrast  Result Date: 10/18/2021 CLINICAL DATA:  Mesenteric ischemia EXAM: CT ANGIOGRAPHY CHEST, ABDOMEN AND PELVIS TECHNIQUE: Non-contrast CT of the chest was initially obtained. Multidetector CT imaging through the chest, abdomen and pelvis was performed using the standard protocol during bolus administration of intravenous contrast. Multiplanar reconstructed images and MIPs were obtained and reviewed to evaluate the vascular anatomy. CONTRAST:  OMNIPAQUE IOHEXOL 350 MG/ML SOLN COMPARISON:  CT abdomen and pelvis dated June 26, 2020. FINDINGS: CTA CHEST FINDINGS Cardiovascular: Normal heart size. No pericardial effusion. Coronary artery calcifications of the LAD. No suspicious filling defects of the pulmonary arteries. Mediastinum/Nodes: No pathologically enlarged lymph nodes seen in the chest. Patulous esophagus. Lungs/Pleura: Central airways are patent. Bibasilar atelectasis. No consolidation, pleural effusion or pneumothorax. Musculoskeletal: No chest wall abnormality. No acute or significant osseous findings. Review of the MIP images confirms the above findings. CTA ABDOMEN AND PELVIS FINDINGS VASCULAR Aorta: Normal caliber aorta without aneurysm,  dissection, vasculitis or significant stenosis. Celiac: Patent without evidence of aneurysm, dissection, vasculitis or significant stenosis. SMA: Patent without evidence of aneurysm, dissection, vasculitis or significant stenosis. Renals: Both renal arteries are patent without evidence of aneurysm, dissection, vasculitis, fibromuscular dysplasia or significant stenosis. IMA: Patent without evidence of aneurysm, dissection, vasculitis or significant stenosis. Inflow: Patent without  evidence of aneurysm, dissection, vasculitis or significant stenosis. Veins: No obvious venous abnormality within the limitations of this arterial phase study. Review of the MIP images confirms the above findings. NON-VASCULAR Hepatobiliary: Numerous low-attenuation liver lesions which are likely simple cysts. Gallbladder is decompressed. No biliary ductal dilation. Pancreas: Unremarkable. Diffuse prominence of the pancreatic duct which is likely age related. No surrounding inflammatory changes. Spleen: Normal in size without focal abnormality. Adrenals/Urinary Tract: Adrenal glands are unremarkable. Kidneys are normal, without renal calculi, focal lesion, or hydronephrosis. Bladder is unremarkable. Stomach/Bowel: Stomach is within normal limits. Appendix is not visualized. Diverticulosis. No evidence of bowel wall thickening, distention, or inflammatory changes. Lymphatic: No pathologically enlarged lymph nodes seen in the abdomen or pelvis. Reproductive: Status post hysterectomy. No adnexal masses. Other: No abdominal wall hernia or abnormality. No abdominopelvic ascites. Musculoskeletal: Grade 1 anterolisthesis of L3 on L4, unchanged compared to prior. Mild L1 compression deformity, unchanged compared to prior. No acute osseous findings. Review of the MIP images confirms the above findings. IMPRESSION: 1. No bowel wall thickening or arterial occlusion/stenosis to suggest mesenteric ischemia. 2.  Aortic Atherosclerosis (ICD10-I70.0). 3.  Diverticulosis with no diverticulitis. Electronically Signed   By: Yetta Glassman M.D.   On: 10/18/2021 15:45    Procedures Procedures   Medications Ordered in ED Medications  ondansetron (ZOFRAN) injection 4 mg (4 mg Intravenous Given 10/18/21 1419)  fentaNYL (SUBLIMAZE) injection 25 mcg (25 mcg Intravenous Given 10/18/21 1420)  lactated ringers bolus 1,000 mL (0 mLs Intravenous Stopped 10/18/21 1731)  iohexol (OMNIPAQUE) 350 MG/ML injection 100 mL (100 mLs Intravenous Contrast Given 10/18/21 1510)    ED Course  I have reviewed the triage vital signs and the nursing notes.  Pertinent labs & imaging results that were available during my care of the patient were reviewed by me and considered in my medical decision making (see chart for details).    MDM Rules/Calculators/A&P                          87 year old female with past medical history significant for coronary artery disease, GERD, HLD who presents to the ED with substernal and epigastric chest and abdominal pain. The patient states that she has a history of GERD and sleeps propped up partially to minimize episodes of nightly discomfort. She also has a history of STEMI and felt that she had similar symptoms compared to her prior MI in 2017. She felt nausea, diaphoresis, and has had a few episodes of loose stools. No fevers or chills. Her pain is described as a dull ache, no aggravating or alleviating factors, associated with nausea. She states that she has no exertional chest pain and in her cardiology follow-up visits had been described as "doing well." She did take a nitroglycerin prior to arrival. Her pain is now completely resolved.    On arrival, the patient was afebrile, hemodynamically stable, not tachycardic or tachypneic, saturating well on room air. Physical exam significant for epigastric tenderness with mild generalized abdominal tenderness. Negative murphy's sign.    EKG: Normal sinus rhythm with a rate of 81 and no  evidence of acute ischemic changes, abnormal intervals, or dysrhythmia. No concerning change from prior   Lab results include:Troponins x2 normal, COVID and influenza PCR negative, Lactic acid normal, UA negative, CMP generally unremarkable with mildly elevated T bili to 1.5, BG 128 and CO2 19 without an anion gap. Lipase normal.   Imaging results include: CXR with a potentially minimal hazy  opacity in the right upper mid lung, although the patient does not endorse a cough, fever or chills.   A CTA chest abdomen pelvis was performed which revealed no acute findings. No evidence for PNA. No PE, aortic dissection, aneurysm, or evidence of mesenteric ischemia. No SBO, pancreatitis, appendicitis, cholecystitis. Diverticulosis noted without evidence of diverticulitis. The patient also is not tender in her LLQ.  1. No bowel wall thickening or arterial occlusion/stenosis to  suggest mesenteric ischemia.  2.  Aortic Atherosclerosis (ICD10-I70.0).  3. Diverticulosis with no diverticulitis.   Course of tx has consisted [id number], Fentanyl, IVF bolus.   Thought process: Differential diagnosis includes: ACS, pneumonia, pneumothorax, pulmonary embolism,pericarditis/myocarditis, GERD, PUD, musculoskeletal.   Labs unremarkable.  Unlikely pneumonia, no cough, no leukocytosis, no fevers, CT imaging without acute findings. Unlikely pneumothorax, no findings on  CXR or CT. Unlikely pericarditis/myocarditis, does not fit clinical picture. Chest pain not exertional. Unlikely dissection, no pulse deficit, no tearing chest pain, no neurologic complaints. Due to patient's high risk ACS was ruled out with delta troponins. She is currently chest pain free. Discussed the negative workup with the patient. Given her nausea and epigastric/substernal pain, symptoms could be consistent with a peptic ulcer vs developing infectious colitis. Will trial course of Protonix and Pepcid and place referral to gastroenterology for further  management. Patient also advised to follow-up outpatient with her cardiologist. Stable for discharge.   Final Clinical Impression(s) / ED Diagnoses Final diagnoses:  Epigastric pain  Diarrhea, unspecified type    Rx / DC Orders ED Discharge Orders          Ordered    Ambulatory referral to Gastroenterology        10/18/21 1632    pantoprazole (PROTONIX) 40 MG tablet  Daily        10/18/21 1658    famotidine (PEPCID) 20 MG tablet  2 times daily        10/18/21 1658             Regan Lemming, MD 10/19/21 2132

## 2021-10-18 NOTE — ED Triage Notes (Signed)
Pt reports CP since last night "hurting" states pain feels similar to last MI. Pt also c/o nausea, sweating, diarrhea.

## 2021-10-18 NOTE — Discharge Instructions (Addendum)
You were evaluated in the Emergency Department and after careful evaluation, we did not find any emergent condition requiring admission or further testing in the hospital.  Your exam/testing today was overall reassuring.  Your CT imaging was unremarkable, your delta troponins were normal.  There is no concern for heart attack at this time.  Given your chronic epigastric abdominal discomfort, referrals been placed for evaluation in clinic by gastroenterology.  We will start you on Pepcid and Protonix.  Please return to the Emergency Department if you experience any worsening of your condition.  Thank you for allowing [name] to be a part of your care.

## 2021-10-22 ENCOUNTER — Encounter: Payer: Self-pay | Admitting: Internal Medicine

## 2021-10-22 ENCOUNTER — Telehealth: Payer: Self-pay | Admitting: Nurse Practitioner

## 2021-10-22 NOTE — Telephone Encounter (Signed)
Pt needs refill on famotidine and pantorazole... it was prescribed at ER ... the doctor there told her to schedule with Gastro... they cannot see her until Feb.  Pt is going out of town 1/11 and will need more of these meds.

## 2021-10-23 ENCOUNTER — Telehealth: Payer: Self-pay | Admitting: Nurse Practitioner

## 2021-10-24 ENCOUNTER — Other Ambulatory Visit: Payer: Self-pay

## 2021-10-24 ENCOUNTER — Encounter: Payer: Self-pay | Admitting: Nurse Practitioner

## 2021-10-24 ENCOUNTER — Ambulatory Visit (INDEPENDENT_AMBULATORY_CARE_PROVIDER_SITE_OTHER): Payer: Medicare Other | Admitting: Nurse Practitioner

## 2021-10-24 VITALS — BP 110/64 | HR 96 | Temp 98.7°F | Resp 18 | Wt 131.6 lb

## 2021-10-24 DIAGNOSIS — R1013 Epigastric pain: Secondary | ICD-10-CM

## 2021-10-24 DIAGNOSIS — K219 Gastro-esophageal reflux disease without esophagitis: Secondary | ICD-10-CM

## 2021-10-24 MED ORDER — PANTOPRAZOLE SODIUM 40 MG PO TBEC: 40.0000 mg | DELAYED_RELEASE_TABLET | Freq: Two times a day (BID) | ORAL | 0 refills | Status: DC

## 2021-10-24 NOTE — Patient Instructions (Signed)
Increase pantoprazole to 40mg  BID Maintain famotidine dose 20mg  BID You will be contacted to schedule appt for upper GI x-ray.  Food Choices for Gastroesophageal Reflux Disease, Adult When you have gastroesophageal reflux disease (GERD), the foods you eat and your eating habits are very important. Choosing the right foods can help ease your discomfort. Think about working with a food expert (dietitian) to help you make good choices. What are tips for following this plan? Reading food labels Look for foods that are low in saturated fat. Foods that may help with your symptoms include: Foods that have less than 5% of daily value (DV) of fat. Foods that have 0 grams of trans fat. Cooking Do not fry your food. Cook your food by baking, steaming, grilling, or broiling. These are all methods that do not need a lot of fat for cooking. To add flavor, try to use herbs that are low in spice and acidity. Meal planning  Choose healthy foods that are low in fat, such as: Fruits and vegetables. Whole grains. Low-fat dairy products. Lean meats, fish, and poultry. Eat small meals often instead of eating 3 large meals each day. Eat your meals slowly in a place where you are relaxed. Avoid bending over or lying down until 2-3 hours after eating. Limit high-fat foods such as fatty meats or fried foods. Limit your intake of fatty foods, such as oils, butter, and shortening. Avoid the following as told by your doctor: Foods that cause symptoms. These may be different for different people. Keep a food diary to keep track of foods that cause symptoms. Alcohol. Drinking a lot of liquid with meals. Eating meals during the 2-3 hours before bed. Lifestyle Stay at a healthy weight. Ask your doctor what weight is healthy for you. If you need to lose weight, work with your doctor to do so safely. Exercise for at least 30 minutes on 5 or more days each week, or as told by your doctor. Wear loose-fitting  clothes. Do not smoke or use any products that contain nicotine or tobacco. If you need help quitting, ask your doctor. Sleep with the head of your bed higher than your feet. Use a wedge under the mattress or blocks under the bed frame to raise the head of the bed. Chew sugar-free gum after meals. What foods should eat? Eat a healthy, well-balanced diet of fruits, vegetables, whole grains, low-fat dairy products, lean meats, fish, and poultry. Each person is different. Foods that may cause symptoms in one person may not cause any symptoms in another person. Work with your doctor to find foods that are safe for you. The items listed above may not be a complete list of what you can eat and drink. Contact a food expert for more options. What foods should I avoid? Limiting some of these foods may help in managing the symptoms of GERD. Everyone is different. Talk with a food expert or your doctor to help you find the exact foods to avoid, if any. Fruits Any fruits prepared with added fat. Any fruits that cause symptoms. For some people, this may include citrus fruits, such as oranges, grapefruit, pineapple, and lemons. Vegetables Deep-fried vegetables. fries. Any vegetables prepared with added fat. Any vegetables that cause symptoms. For some people, this may include tomatoes and tomato products, chili peppers, onions and garlic, and horseradish. Grains Pastries or quick breads with added fat. Meats and other proteins High-fat meats, such as fatty beef or pork, hot dogs, ribs, ham, sausage, salami,  and bacon. Fried meat or protein, including fried fish and fried chicken. Nuts and nut butters, in large amounts. Dairy Whole milk and chocolate milk. Sour cream. Cream. Ice cream. Cream cheese. Milkshakes. Fats and oils Butter. Margarine. Shortening. Ghee. Beverages Coffee and tea, with or without caffeine. Carbonated beverages. Sodas. Energy drinks. Fruit juice made with acidic fruits, such as  orange or grapefruit. Tomato juice. Alcoholic drinks. Sweets and desserts Chocolate and cocoa. Donuts. Seasonings and condiments Pepper. Peppermint and spearmint. Added salt. Any condiments, herbs, or seasonings that cause symptoms. For some people, this may include curry, hot sauce, or vinegar-based salad dressings. The items listed above may not be a complete list of what you should not eat and drink. Contact a food expert for more options. Questions to ask your doctor Diet and lifestyle changes are often the first steps that are taken to manage symptoms of GERD. If diet and lifestyle changes do not help, talk with your doctor about taking medicines. Where to find more information International [employer] for Gastrointestinal Disorders: aboutgerd.org Summary When you have GERD, food and lifestyle choices are very important in easing your symptoms. Eat small meals often instead of 3 large meals a day. Eat your meals slowly and in a place where you are relaxed. Avoid bending over or lying down until 2-3 hours after eating. Limit high-fat foods such as fatty meats or fried foods. This information is not intended to replace advice given to you by your health care provider. Make sure you discuss any questions you have with your health care provider. Document Revised: 04/16/2020 Document Reviewed: 04/16/2020 Elsevier Patient Education  2022 [employer].

## 2021-10-24 NOTE — Telephone Encounter (Signed)
Pt seen in office today.

## 2021-10-24 NOTE — Progress Notes (Signed)
Subjective:  Patient ID: Nancy Neal, female    DOB: 09/20/1932  Age: 88 y.o. MRN: [id number]  CC: Abdominal Pain (Pt c/o abdominal pain,seen in ED on 12/30/20222 pt has appointment with gastro, pt is currently taking famotidine and pantoprazole )  Abdominal Pain This is a new problem. The current episode started in the past 7 days. The onset quality is gradual. The problem occurs constantly. The problem has been gradually improving. The pain is located in the epigastric region. The pain is mild. The quality of the pain is dull and burning. The abdominal pain does not radiate. Pertinent negatives include no anorexia, arthralgias, belching, constipation, diarrhea, dysuria, fever, flatus, frequency, headaches, hematochezia, hematuria, melena, myalgias, nausea, vomiting or weight loss. Nothing aggravates the pain. The pain is relieved by Nothing. She has tried proton pump inhibitors and H2 blockers for the symptoms. The treatment provided mild relief. Prior workup: appt with GI 11/26/21. Her past medical history is significant for GERD. There is no history of abdominal surgery, colon cancer, Crohn's disease, gallstones, irritable bowel syndrome, pancreatitis, PUD or ulcerative colitis.  She is traveling to [place] 10/31/21, and is concerned she migh get sick again while abroad. Diarrhea has resolved since ED visit. She thinks it was related to eating large amount of oatmeal.  Reviewed past Medical, Social and Family history today.  Outpatient Medications Prior to Visit  Medication Sig Dispense Refill   acetaminophen (TYLENOL) 325 MG tablet Take 2 tablets (650 mg total) by mouth every 4 (four) hours as needed for headache or mild pain.     Ascorbic Acid (VITAMIN C ER PO) Take 1 tablet by mouth daily.     aspirin 81 MG chewable tablet Chew 1 tablet (81 mg total) by mouth daily.     atorvastatin (LIPITOR) 10 MG tablet TAKE 1 TABLET(10 MG) BY MOUTH DAILY (Patient taking differently: Take 10 mg by mouth  daily.) 90 tablet 3   carboxymethylcellulose (REFRESH PLUS) 0.5 % SOLN Place 1 drop into both eyes 3 (three) times daily as needed (dry eyes).      ergocalciferol (VITAMIN D2) 1.25 MG (50000 UT) capsule Take 50,000 Units by mouth once a week.     famotidine (PEPCID) 20 MG tablet Take 1 tablet (20 mg total) by mouth 2 (two) times daily. 30 tablet 0   MAGNESIUM PO Take 1 tablet by mouth daily.     Multiple Vitamin (MULTIVITAMIN WITH MINERALS) TABS tablet Take 1 tablet by mouth daily.     vitamin E 1000 UNIT capsule Take 1,000 Units by mouth daily.     pantoprazole (PROTONIX) 40 MG tablet Take 1 tablet (40 mg total) by mouth daily. 30 tablet 0   No facility-administered medications prior to visit.    ROS See HPI  Objective:  BP 110/64 (BP Location: Left Arm, Patient Position: Sitting, Cuff Size: Normal)    Pulse 96    Temp 98.7 F (37.1 C) (Temporal)    Resp 18    Wt 131 lb 9.6 oz (59.7 kg)    SpO2 98%    BMI 22.59 kg/m   Physical Exam Pulmonary:     Effort: Pulmonary effort is normal.  Abdominal:     General: Abdomen is flat.     Palpations: Abdomen is soft.     Tenderness: There is abdominal tenderness in the epigastric area.     Hernia: No hernia is present.  Skin:    General: Skin is warm and dry.     Findings:  No rash.  Neurological:     Mental Status: She is alert and oriented to person, place, and time.  Psychiatric:        Mood and Affect: Mood normal.        Behavior: Behavior normal.    Assessment & Plan:  This visit occurred during the SARS-CoV-2 public health emergency.  Safety protocols were in place, including screening questions prior to the visit, additional usage of staff PPE, and extensive cleaning of exam room while observing appropriate contact time as indicated for disinfecting solutions.   Mane was seen today for abdominal pain.  Diagnoses and all orders for this visit:  Epigastric pain -     DG UGI W SINGLE CM (SOL OR THIN BA); Future -      pantoprazole (PROTONIX) 40 MG tablet; Take 1 tablet (40 mg total) by mouth 2 (two) times daily.  Gastroesophageal reflux disease without esophagitis -     DG UGI W SINGLE CM (SOL OR THIN BA); Future -     pantoprazole (PROTONIX) 40 MG tablet; Take 1 tablet (40 mg total) by mouth 2 (two) times daily.  Telephone collaboration with Alonza Bogus, PA with [facility] group: recommendations increase PPI to 40mg  BID or add carafate QID, continue famotidine at current dose, and obtain UGI DG. This took 60mins  Problem List Items Addressed This Visit   None Visit Diagnoses     Epigastric pain    -  Primary   Relevant Medications   pantoprazole (PROTONIX) 40 MG tablet   Other Relevant Orders   DG UGI W SINGLE CM (SOL OR THIN BA)   Gastroesophageal reflux disease without esophagitis       Relevant Medications   pantoprazole (PROTONIX) 40 MG tablet   Other Relevant Orders   DG UGI W SINGLE CM (SOL OR THIN BA)       Follow-up: Return if symptoms worsen or fail to improve.  Wilfred Lacy, NP

## 2021-10-25 ENCOUNTER — Telehealth: Payer: Self-pay

## 2021-10-25 NOTE — Telephone Encounter (Signed)
Called and spoke to pt, PA approved for pantoprazole 40mg  90 days supply. Pt to pickup 10/29/20 per insurance

## 2021-10-28 ENCOUNTER — Other Ambulatory Visit: Payer: Self-pay | Admitting: Nurse Practitioner

## 2021-10-28 ENCOUNTER — Ambulatory Visit
Admission: RE | Admit: 2021-10-28 | Discharge: 2021-10-28 | Disposition: A | Discharge status: Home / Self Care | Payer: Medicare Other | Source: Ambulatory Visit | Attending: Nurse Practitioner | Admitting: Nurse Practitioner

## 2021-10-28 DIAGNOSIS — R1013 Epigastric pain: Secondary | ICD-10-CM | POA: Diagnosis not present

## 2021-10-28 DIAGNOSIS — K219 Gastro-esophageal reflux disease without esophagitis: Secondary | ICD-10-CM | POA: Diagnosis not present

## 2021-10-28 DIAGNOSIS — K224 Dyskinesia of esophagus: Secondary | ICD-10-CM | POA: Diagnosis not present

## 2021-10-31 ENCOUNTER — Other Ambulatory Visit: Payer: Federal, State, Local not specified - PPO

## 2021-10-31 ENCOUNTER — Ambulatory Visit: Payer: Federal, State, Local not specified - PPO

## 2021-11-22 ENCOUNTER — Other Ambulatory Visit: Payer: Self-pay | Admitting: Nurse Practitioner

## 2021-11-22 ENCOUNTER — Ambulatory Visit
Admission: RE | Admit: 2021-11-22 | Discharge: 2021-11-22 | Disposition: A | Discharge status: Home / Self Care | Payer: Medicare Other | Source: Ambulatory Visit | Attending: Family Medicine | Admitting: Family Medicine

## 2021-11-22 DIAGNOSIS — M810 Age-related osteoporosis without current pathological fracture: Secondary | ICD-10-CM | POA: Diagnosis not present

## 2021-11-22 DIAGNOSIS — Z1231 Encounter for screening mammogram for malignant neoplasm of breast: Secondary | ICD-10-CM | POA: Diagnosis not present

## 2021-11-22 DIAGNOSIS — Z780 Asymptomatic menopausal state: Secondary | ICD-10-CM | POA: Diagnosis not present

## 2021-11-22 DIAGNOSIS — M8589 Other specified disorders of bone density and structure, multiple sites: Secondary | ICD-10-CM

## 2021-11-26 ENCOUNTER — Ambulatory Visit (INDEPENDENT_AMBULATORY_CARE_PROVIDER_SITE_OTHER): Payer: Medicare Other | Admitting: Internal Medicine

## 2021-11-26 ENCOUNTER — Encounter: Payer: Self-pay | Admitting: Internal Medicine

## 2021-11-26 VITALS — BP 140/86 | HR 82 | Ht 64.0 in | Wt 135.0 lb

## 2021-11-26 DIAGNOSIS — K219 Gastro-esophageal reflux disease without esophagitis: Secondary | ICD-10-CM | POA: Diagnosis not present

## 2021-11-26 DIAGNOSIS — R1013 Epigastric pain: Secondary | ICD-10-CM | POA: Diagnosis not present

## 2021-11-26 MED ORDER — PANTOPRAZOLE SODIUM 40 MG PO TBEC: 40.0000 mg | DELAYED_RELEASE_TABLET | Freq: Two times a day (BID) | ORAL | 6 refills | Status: DC

## 2021-11-26 NOTE — Patient Instructions (Signed)
If you are age 86 or older, your body mass index should be between 23-30. Your Body mass index is 23.17 kg/m. If this is out of the aforementioned range listed, please consider follow up with your Primary Care Provider.  If you are age 22 or younger, your body mass index should be between 19-25. Your Body mass index is 23.17 kg/m. If this is out of the aformentioned range listed, please consider follow up with your Primary Care Provider.   ________________________________________________________  The [facility] providers would like to encourage you to use [facility] to communicate with providers for non-urgent requests or questions.  Due to long hold times on the telephone, sending your provider a message by [facility] may be a faster and more efficient way to get a response.  Please allow 48 business hours for a response.  Please remember that this is for non-urgent requests.  _______________________________________________________   We have sent the following medications to your pharmacy for you to pick up at your convenience:  Pantoprazole  Please follow up in 3 months

## 2021-11-26 NOTE — Progress Notes (Signed)
Chief Complaint: Epigastric abdominal pain  HPI : 88 year old female with history of GERD, CAD, glaucoma presents with epigastric abdominal pain.  She was recently seen in the ED on 10/18/2021 with complaints of epigastric abdominal pain.  At that time she was concerned about potential heart issues.  She had an EKG and troponin drawn at that time that were normal.  CTA chest/abdomen/pelvis were performed without any signs of obvious abdominal pain.  She was instructed to take pantoprazole 40 mg twice daily after leaving the ED.  She states that since leaving the ED, her abdominal pain has essentially resolved.  She is now on Protonix 40 mg once a day.  Denies melena, hematochezia, dysphagia, weight loss.  She did previously have issues with reflux, which is also being helped by daily Protonix.   Past Medical History:  Diagnosis Date   ALLERGIC RHINITIS 06/02/2007   Arthritis    "maybe in my right knee" (12/19/2015)   Coronary artery disease 11/2015   a. 11/2015: Inferior STEMI w/ Promus DES to RCA.     Diverticulosis of colon 2003   by 2003 and 2008 colonoscopy.    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   History of blood transfusion 1971   "when I had hysterectomy"   Myocardial infarction (HCC) 11/2015   OSTEOPENIA 06/02/2007   VAGINITIS, ATROPHIC, POSTMENOPAUSAL 12/06/2008   VERTIGO 02/16/2009     Past Surgical History:  Procedure Laterality Date   APPENDECTOMY  1971   CARDIAC CATHETERIZATION N/A 12/15/2015   Procedure: Left Heart Cath and Coronary Angiography;  Surgeon: Lyn Records, MD;  Location: [facility] INVASIVE CV LAB;  Service: Cardiovascular;  Laterality: N/A;   CARDIAC CATHETERIZATION N/A 12/15/2015   Procedure: Coronary Stent Intervention;  Surgeon: Lyn Records, MD;  Location: [facility] INVASIVE CV LAB;  Service: Cardiovascular;  Laterality: N/A;   COLONOSCOPY N/A 12/20/2015   Procedure: COLONOSCOPY;  Surgeon: Hilarie Fredrickson, MD;  Location: [facility] ENDOSCOPY;   Service: Endoscopy;  Laterality: N/A;   EYE SURGERY Bilateral    "laser tx for glaucoma"   HEMORRHOID SURGERY  ~ 1968   SHOULDER ARTHROSCOPY W/ ROTATOR CUFF REPAIR Right 11/2014   TOTAL ABDOMINAL HYSTERECTOMY  1971   Family History  Problem Relation Age of Onset   Suicidality Mother    Heart disease Father    Arthritis Brother    Breast cancer Neg Hx    Social History   Tobacco Use   Smoking status: Never    Passive exposure: Past   Smokeless tobacco: Never  Vaping Use   Vaping Use: Never used  Substance Use Topics   Alcohol use: Not Currently    Comment: sporadically   Drug use: No   Current Outpatient Medications  Medication Sig Dispense Refill   acetaminophen (TYLENOL) 325 MG tablet Take 2 tablets (650 mg total) by mouth every 4 (four) hours as needed for headache or mild pain.     Ascorbic Acid (VITAMIN C ER PO) Take 1 tablet by mouth daily.     aspirin 81 MG chewable tablet Chew 1 tablet (81 mg total) by mouth daily.     atorvastatin (LIPITOR) 10 MG tablet TAKE 1 TABLET(10 MG) BY MOUTH DAILY (Patient taking differently: Take 10 mg by mouth daily.) 90 tablet 3   carboxymethylcellulose (REFRESH PLUS) 0.5 % SOLN Place 1 drop into both eyes 3 (three) times daily as needed (dry eyes).      ergocalciferol (VITAMIN D2) 1.25  MG (50000 UT) capsule Take 50,000 Units by mouth once a week.     famotidine (PEPCID) 20 MG tablet Take 1 tablet (20 mg total) by mouth 2 (two) times daily. 30 tablet 0   MAGNESIUM PO Take 1 tablet by mouth daily.     Multiple Vitamin (MULTIVITAMIN WITH MINERALS) TABS tablet Take 1 tablet by mouth daily.     pantoprazole (PROTONIX) 40 MG tablet Take 1 tablet (40 mg total) by mouth 2 (two) times daily. 60 tablet 0   vitamin E 1000 UNIT capsule Take 1,000 Units by mouth daily.     No current facility-administered medications for this visit.   Allergies  Allergen Reactions   Codeine Sulfate Nausea And Vomiting   Meclizine Other (See Comments)    Other  reaction(s): Other (See Comments) glaucoma glaucoma   Acetaminophen Nausea Only    Pain Medicines make her sick   Morphine    Other     "All painkillers" cause nausea   Tramadol      Review of Systems: All systems reviewed and negative except where noted in HPI.   Physical Exam: There were no vitals taken for this visit. Constitutional: Pleasant,well-developed,female in no acute distress. HEENT: Normocephalic and atraumatic. Conjunctivae are normal. No scleral icterus. Cardiovascular: Normal rate, regular rhythm.  Pulmonary/chest: Effort normal and breath sounds normal. No wheezing, rales or rhonchi. Abdominal: Soft, nondistended, nontender. Bowel sounds active throughout. There are no masses palpable. No hepatomegaly. Extremities: No edema Neurological: Alert and oriented to person place and time. Skin: Skin is warm and dry. No rashes noted. Psychiatric: Normal mood and affect. Behavior is normal.  Labs 08/2021: H pylori breath test negative  Labs 09/2021: CMP with mildly elevated T bili of 1.5. Lipase normal.  CTA C/A/P 10/18/21: IMPRESSION: 1. No bowel wall thickening or arterial occlusion/stenosis to suggest mesenteric ischemia. 2.  Aortic Atherosclerosis (ICD10-I70.0). 3. Diverticulosis with no diverticulitis.  UGI Series 10/28/21: IMPRESSION: 1. Mild-to-moderate gastroesophageal reflux elicited. No hiatal hernia. 2. Moderate esophageal dysmotility with a presbyesophagus pattern. 3. Evidence of mild reflux esophagitis. No evidence of esophageal mass or stricture. 4. Normal stomach and duodenum.  ASSESSMENT AND PLAN: GERD Epigastric ab pain Presents with episode of epigastric abdominal pain that has responded well to Protonix therapy.  She also has had longstanding issues with GERD.  Will continue to continue to monitor her on daily Protonix, and wean down on this medication during future visits if possible. -Continue daily Protonix for now.  Refilled -RTC 3  months.  If still doing well by then, can drop down to 20 mg daily  Eulah Pont, MD

## 2021-12-09 ENCOUNTER — Telehealth: Payer: Self-pay | Admitting: Nurse Practitioner

## 2021-12-09 NOTE — Telephone Encounter (Signed)
Pt called in to f/u on getting more information of Prolia injections.  Call back # [phone]

## 2021-12-10 NOTE — Telephone Encounter (Signed)
Spoke with patient and informed her we would mail over information regarding the Prolia injection.

## 2021-12-20 DIAGNOSIS — H401122 Primary open-angle glaucoma, left eye, moderate stage: Secondary | ICD-10-CM | POA: Diagnosis not present

## 2021-12-20 DIAGNOSIS — H401111 Primary open-angle glaucoma, right eye, mild stage: Secondary | ICD-10-CM | POA: Diagnosis not present

## 2022-02-10 NOTE — Progress Notes (Signed)
?Cardiology Office Note:   ? ?Date:  02/10/2022  ? ?ID:  Nancy Neal, DOB 1931-11-06, MRN [id number] ? ?PCP:  Flossie Buffy, NP  ?Cardiologist:  Sinclair Grooms, MD  ? ?Referring MD: Flossie Buffy, NP  ? ?No chief complaint on file. ? ? ?History of Present Illness:   ? ?Nancy Neal is a 88 y.o. female with a hx of acute inferior MI RCA DES February 2017 and documentation of residual 60-70% mid LAD, normal residual LV function, and hyperlipidemia. ?  ?She has no cardiac complaints. ? ?She has been having musculoskeletal complaints including sciatica in her right calf. ? ?There have been no episodes of chest discomfort or requirement for nitroglycerin.  She denies side effects related to statin therapy although she points out that she has allergies or intolerance to many medications that she has tried in the past. ? ?Past Medical History:  ?Diagnosis Date  ? ALLERGIC RHINITIS 06/02/2007  ? Arthritis   ? "maybe in my right knee" (12/19/2015)  ? Coronary artery disease 11/2015  ? a. 11/2015: Inferior STEMI w/ Promus DES to RCA.    ? Diverticulosis of colon 2003  ? by 2003 and 2008 colonoscopy.   ? GERD (gastroesophageal reflux disease)   ? Glaucoma 1-12  ? open angle  ? History of blood transfusion 1971  ? "when I had hysterectomy"  ? Myocardial infarction [facility]) 11/2015  ? OSTEOPENIA 06/02/2007  ? VAGINITIS, ATROPHIC, POSTMENOPAUSAL 12/06/2008  ? VERTIGO 02/16/2009  ? ? ?Past Surgical History:  ?Procedure Laterality Date  ? APPENDECTOMY  1971  ? CARDIAC CATHETERIZATION N/A 12/15/2015  ? Procedure: Left Heart Cath and Coronary Angiography;  Surgeon: Belva Crome, MD;  Location: [facility] CV LAB;  Service: Cardiovascular;  Laterality: N/A;  ? CARDIAC CATHETERIZATION N/A 12/15/2015  ? Procedure: Coronary Stent Intervention;  Surgeon: Belva Crome, MD;  Location: [facility] CV LAB;  Service: Cardiovascular;  Laterality: N/A;  ? COLONOSCOPY N/A 12/20/2015  ? Procedure: COLONOSCOPY;  Surgeon: Irene Shipper, MD;   Location: [facility] ENDOSCOPY;  Service: Endoscopy;  Laterality: N/A;  ? EYE SURGERY Bilateral   ? "laser tx for glaucoma"  ? HEMORRHOID SURGERY  ~ 1968  ? SHOULDER ARTHROSCOPY W/ ROTATOR CUFF REPAIR Right 11/2014  ? TOTAL ABDOMINAL HYSTERECTOMY  1971  ? ? ?Current Medications: ?No outpatient medications have been marked as taking for the 02/12/22 encounter (Appointment) with Belva Crome, MD.  ?  ? ?Allergies:   Codeine sulfate, Meclizine, Acetaminophen, Morphine, Other, and Tramadol  ? ?Social History  ? ?Socioeconomic History  ? Marital status: Widowed  ?  Spouse name: Not on file  ? Number of children: 2  ? Years of education: Not on file  ? Highest education level: Not on file  ?Occupational History  ? Not on file  ?Tobacco Use  ? Smoking status: Never  ?  Passive exposure: Past  ? Smokeless tobacco: Never  ?Vaping Use  ? Vaping Use: Never used  ?Substance and Sexual Activity  ? Alcohol use: Not Currently  ?  Comment: sporadically  ? Drug use: No  ? Sexual activity: Not on file  ?Other Topics Concern  ? Not on file  ?Social History Narrative  ? Not on file  ? ?Social Determinants of Health  ? ?Financial Resource Strain: Not on file  ?Food Insecurity: Not on file  ?Transportation Needs: Not on file  ?Physical Activity: Not on file  ?Stress: Not on file  ?Social  Connections: Not on file  ?  ? ?Family History: ?The patient's family history includes Arthritis in her brother; Heart disease in her father; Suicidality in her mother. There is no history of Breast cancer. ? ?ROS:   ?Please see the history of present illness.    ?Recent UTI led to antibiotic therapy to cause nausea and vomiting leading to an emergency room visit while visiting a relative in [place].  All other systems reviewed and are negative. ? ?EKGs/Labs/Other Studies Reviewed:   ? ?The following studies were reviewed today: ?No new data ? ?EKG:  EKG performed 10/22/2021 demonstrates poor R wave progression V1 through V3.  Old inferior Q waves are  noted. ? ?Recent Labs: ?06/26/2021: Hemoglobin 14.5; Platelets 251 ?10/07/2021: TSH 1.81 ?10/18/2021: ALT 20; BUN 22; Creatinine, Ser 0.81; Potassium 4.1; Sodium 137  ?Recent Lipid Panel ?   ?Component Value Date/Time  ? CHOL 148 10/07/2021 1136  ? CHOL 128 01/02/2020 1030  ? TRIG 128.0 10/07/2021 1136  ? HDL 58.80 10/07/2021 1136  ? HDL 66 01/02/2020 1030  ? CHOLHDL 3 10/07/2021 1136  ? VLDL 25.6 10/07/2021 1136  ? [place] 64 10/07/2021 1136  ? LDLCALC 47 01/02/2020 1030  ? LDLDIRECT 123.2 12/18/2011 0954  ? ? ?Physical Exam:   ? ?VS:  There were no vitals taken for this visit.   ? ?Wt Readings from Last 3 Encounters:  ?11/26/21 135 lb (61.2 kg)  ?10/24/21 131 lb 9.6 oz (59.7 kg)  ?10/18/21 135 lb (61.2 kg)  ?  ? ?GEN: Healthy appearing younger than stated age.. No acute distress ?HEENT: Normal ?NECK: No JVD. ?LYMPHATICS: No lymphadenopathy ?CARDIAC: No murmur. RRR no gallop, or edema. ?VASCULAR:  Normal Pulses. No bruits. ?RESPIRATORY:  Clear to auscultation without rales, wheezing or rhonchi  ?ABDOMEN: Soft, non-tender, non-distended, No pulsatile mass, ?MUSCULOSKELETAL: No deformity  ?SKIN: Warm and dry ?NEUROLOGIC:  Alert and oriented x 3 ?PSYCHIATRIC:  Normal affect  ? ?ASSESSMENT:   ? ?1. Coronary artery disease involving native coronary artery of native heart with angina pectoris ([place])   ?2. Pure hypercholesterolemia   ?3. Gastrointestinal hemorrhage, unspecified gastrointestinal hemorrhage type   ? ?PLAN:   ? ?In order of problems listed above: ? ?Continue secondary prevention.  Continue aspirin.  Continue statin. ?Continue Lipitor 10 mg/day. ?Did not discuss. ? ?Overall education and awareness concerning secondary risk prevention was discussed in detail: LDL less than 70, hemoglobin A1c less than 7, blood pressure target less than 130/80 mmHg, >150 minutes of moderate aerobic activity per week, avoidance of smoking, weight control (via diet and exercise), and continued surveillance/management of/for  obstructive sleep apnea. ? ? ? ?Medication Adjustments/Labs and Tests Ordered: ?Current medicines are reviewed at length with the patient today.  Concerns regarding medicines are outlined above.  ?No orders of the defined types were placed in this encounter. ? ?No orders of the defined types were placed in this encounter. ? ? ?There are no Patient Instructions on file for this visit.  ? ?Signed, ?Sinclair Grooms, MD  ?02/10/2022 8:12 PM    ?[place] ?

## 2022-02-12 ENCOUNTER — Ambulatory Visit (INDEPENDENT_AMBULATORY_CARE_PROVIDER_SITE_OTHER): Payer: Medicare Other | Admitting: Interventional Cardiology

## 2022-02-12 ENCOUNTER — Encounter: Payer: Self-pay | Admitting: Interventional Cardiology

## 2022-02-12 VITALS — BP 128/78 | HR 77 | Ht 64.0 in | Wt 134.8 lb

## 2022-02-12 DIAGNOSIS — I25119 Atherosclerotic heart disease of native coronary artery with unspecified angina pectoris: Secondary | ICD-10-CM

## 2022-02-12 DIAGNOSIS — K922 Gastrointestinal hemorrhage, unspecified: Secondary | ICD-10-CM | POA: Diagnosis not present

## 2022-02-12 DIAGNOSIS — E7800 Pure hypercholesterolemia, unspecified: Secondary | ICD-10-CM | POA: Diagnosis not present

## 2022-02-12 NOTE — Patient Instructions (Signed)
Medication Instructions:  ?Your physician recommends that you continue on your current medications as directed. Please refer to the Current Medication list given to you today. ? ?*If you need a refill on your cardiac medications before your next appointment, please call your pharmacy* ? ?Lab Work: ?NONE ? ?Testing/Procedures: ?NONE ? ?Follow-Up: ?At [facility], you and your health needs are our priority.  As part of our continuing mission to provide you with exceptional heart care, we have created designated Provider Care Teams.  These Care Teams include your primary Cardiologist (physician) and Advanced Practice Providers (APPs -  Physician Assistants and Nurse Practitioners) who all work together to provide you with the care you need, when you need it. ? ?Your next appointment:   ?12 month(s) ? ?The format for your next appointment:   ?In Person ? ?Provider:   ?Henry W Smith III, MD { ? ? ?Important Information About Sugar ? ? ? ? ?  ?

## 2022-02-26 ENCOUNTER — Ambulatory Visit (INDEPENDENT_AMBULATORY_CARE_PROVIDER_SITE_OTHER): Payer: Medicare Other | Admitting: Internal Medicine

## 2022-02-26 ENCOUNTER — Encounter: Payer: Self-pay | Admitting: Internal Medicine

## 2022-02-26 VITALS — BP 142/78 | HR 82 | Ht 64.0 in | Wt 136.8 lb

## 2022-02-26 DIAGNOSIS — R159 Full incontinence of feces: Secondary | ICD-10-CM | POA: Diagnosis not present

## 2022-02-26 DIAGNOSIS — K219 Gastro-esophageal reflux disease without esophagitis: Secondary | ICD-10-CM | POA: Diagnosis not present

## 2022-02-26 DIAGNOSIS — R1013 Epigastric pain: Secondary | ICD-10-CM

## 2022-02-26 MED ORDER — PANTOPRAZOLE SODIUM 40 MG PO TBEC: 40.0000 mg | DELAYED_RELEASE_TABLET | Freq: Every day | ORAL | 3 refills | Status: DC

## 2022-02-26 NOTE — Progress Notes (Signed)
? ?Chief Complaint: Epigastric abdominal pain ? ?HPI : 88 year old female with history of GERD, CAD, glaucoma presents with epigastric abdominal pain. ? ?She was recently seen in the ED on 10/18/2021 with complaints of epigastric abdominal pain.  At that time she was concerned about potential heart issues.  She had an EKG and troponin drawn at that time that were normal.  CTA chest/abdomen/pelvis were performed without any signs of obvious abdominal pain.  She was instructed to take pantoprazole 40 mg twice daily after leaving the ED.  She states that since leaving the ED, her abdominal pain has essentially resolved.  She is now on Protonix 40 mg once a day.  Denies melena, hematochezia, dysphagia, weight loss.  She did previously have issues with reflux, which is also being helped by daily Protonix. ? ?Interval History: She went to visit family over the weekend and ate foods that may have caused her to develop more gas. She does note some stool that comes out with the gas. This has been happening for a while now. The stool can sometimes start irritating her bottom due to rubbing. She wears pads from from leakage of her bladder already, but the stool does not really go onto the pad. She already takes daily Metamucil and Align. Denies diarrhea. Still has a small amount of epigastric ab pain. She is no longer on Protonix medication because she ran out of this medication. Patient had a coughing episode this past weekend. On average she has 3 BMs per day, though these can be pellet-like on occasion. ? ?Current Outpatient Medications  ?Medication Sig Dispense Refill  ? acetaminophen (TYLENOL) 325 MG tablet Take 2 tablets (650 mg total) by mouth every 4 (four) hours as needed for headache or mild pain.    ? Ascorbic Acid (VITAMIN C ER PO) Take 1 tablet by mouth daily.    ? aspirin 81 MG chewable tablet Chew 1 tablet (81 mg total) by mouth daily.    ? atorvastatin (LIPITOR) 10 MG tablet TAKE 1 TABLET(10 MG) BY MOUTH DAILY  (Patient taking differently: Take 10 mg by mouth daily.) 90 tablet 3  ? carboxymethylcellulose (REFRESH PLUS) 0.5 % SOLN Place 1 drop into both eyes 3 (three) times daily as needed (dry eyes).     ? MAGNESIUM PO Take 1 tablet by mouth daily.    ? Multiple Vitamin (MULTIVITAMIN WITH MINERALS) TABS tablet Take 1 tablet by mouth daily.    ? vitamin B-12 (CYANOCOBALAMIN) 100 MCG tablet Take 100 mcg by mouth daily.    ? vitamin E 1000 UNIT capsule Take 1,000 Units by mouth daily.    ? ?No current facility-administered medications for this visit.  ? ?Review of Systems: ?All systems reviewed and negative except where noted in HPI.  ? ?Physical Exam: ?Ht 5\' 4"  (1.626 m)   Wt 136 lb 12.8 oz (62.1 kg)   BMI 23.48 kg/m?  ?Constitutional: Pleasant,well-developed,female in no acute distress. ?HEENT: Normocephalic and atraumatic. Conjunctivae are normal. No scleral icterus. ?Cardiovascular: Normal rate, regular rhythm.  ?Pulmonary/chest: Effort normal and breath sounds normal. No wheezing, rales or rhonchi. ?Abdominal: Soft, nondistended, nontender. Bowel sounds active throughout. There are no masses palpable. No hepatomegaly. ?Extremities: No edema ?Neurological: Alert and oriented to person place and time. ?Skin: Skin is warm and dry. No rashes noted. ?Psychiatric: Normal mood and affect. Behavior is normal. ? ?Labs 08/2021: H pylori breath test negative ? ?Labs 09/2021: CMP with mildly elevated T bili of 1.5. Lipase normal. ? ?CTA C/A/P 10/18/21: ?  IMPRESSION: ?1. No bowel wall thickening or arterial occlusion/stenosis to suggest mesenteric ischemia. ?2.  Aortic Atherosclerosis (ICD10-I70.0). ?3. Diverticulosis with no diverticulitis. ? ?UGI Series 10/28/21: ?IMPRESSION: ?1. Mild-to-moderate gastroesophageal reflux elicited. No hiatal hernia. ?2. Moderate esophageal dysmotility with a presbyesophagus pattern. ?3. Evidence of mild reflux esophagitis. No evidence of esophageal mass or stricture. ?4. Normal stomach and  duodenum. ? ?ASSESSMENT AND PLAN: ?GERD ?Epigastric ab pain ?Fecal incontinence ?Patient presents with some issue with gas recently. This could be related to the types of foods that she is eating so will have her review the low FODMAP diet to see if this will help with her symptoms. In the meantime I have asked her to start simethicone PRN and restart Protonix in case GERD is playing a role. Patient also describes some issues with fecal incontinence. I offered her pelvic floor PT, but she was not interested at this time. Constipation could potentially be playing a role so will have her drink 8 cups of water per day ?- Low FODMAP diet ?- Drink 8 cups of water per day ?- Continue daily Metamucil ?- Start simethicone PRN ?- Restart Protonix 20 mg QD  ?- RTC 3 months ? ?Eulah Pont, MD ? ?I spent 42 minutes of time, including in depth chart review, independent review of results as outlined above, communicating results with the patient directly, face-to-face time with the patient, coordinating care, ordering studies and medications as appropriate, and documentation. ? ?

## 2022-02-26 NOTE — Patient Instructions (Addendum)
Use Simethicone as needed ? ?Restart Protonix 20 every day ? ?We have sent the following medications to your pharmacy for you to pick up at your convenience:   Protonix  ? ?Follow up in 3 months  ? ? ?Low-FODMAP Eating Plan ? ?FODMAP stands for fermentable oligosaccharides, disaccharides, monosaccharides, and polyols. These are sugars that are hard for some people to digest. A low-FODMAP eating plan may help some people who have irritable bowel syndrome (IBS) and certain other bowel (intestinal) diseases to manage their symptoms. ?This meal plan can be complicated to follow. Work with a diet and nutrition specialist (dietitian) to make a low-FODMAP eating plan that is right for you. A dietitian can help make sure that you get enough nutrition from this diet. ?What are tips for following this plan? ?Reading food labels ?Check labels for hidden FODMAPs such as: ?High-fructose syrup. ?Honey. ?Agave. ?Natural fruit flavors. ?Onion or garlic powder. ?Choose low-FODMAP foods that contain 3-4 grams of fiber per serving. ?Check food labels for serving sizes. Eat only one serving at a time to make sure FODMAP levels stay low. ?Shopping ?Shop with a list of foods that are recommended on this diet and make a meal plan. ?Meal planning ?Follow a low-FODMAP eating plan for up to 6 weeks, or as told by your health care provider or dietitian. ?To follow the eating plan: ?Eliminate high-FODMAP foods from your diet completely. Choose only low-FODMAP foods to eat. You will do this for 2-6 weeks. ?Gradually reintroduce high-FODMAP foods into your diet one at a time. Most people should wait a few days before introducing the next new high-FODMAP food into their meal plan. Your dietitian can recommend how quickly you may reintroduce foods. ?Keep a daily record of what and how much you eat and drink. Make note of any symptoms that you have after eating. ?Review your daily record with a dietitian regularly to identify which foods you can  eat and which foods you should avoid. ?General tips ?Drink enough fluid each day to keep your urine pale yellow. ?Avoid processed foods. These often have added sugar and may be high in FODMAPs. ?Avoid most dairy products, whole grains, and sweeteners. ?Work with a dietitian to make sure you get enough fiber in your diet. ?Avoid high FODMAP foods at meals to manage symptoms. ?Recommended foods ?Fruits ?Bananas, oranges, tangerines, lemons, limes, blueberries, raspberries, strawberries, grapes, cantaloupe, honeydew melon, kiwi, papaya, passion fruit, and pineapple. Limited amounts of dried cranberries, banana chips, and shredded coconut. ?Vegetables ?Eggplant, zucchini, cucumber, peppers, green beans, bean sprouts, lettuce, arugula, kale, Swiss chard, spinach, collard greens, bok choy, summer squash, potato, and tomato. Limited amounts of corn, carrot, and sweet potato. Green parts of scallions. ?Grains ?Gluten-free grains, such as rice, oats, buckwheat, quinoa, corn, polenta, and millet. Gluten-free pasta, bread, or cereal. Rice noodles. Corn tortillas. ?Meats and other proteins ?Unseasoned beef, pork, poultry, or fish. Eggs. Tomasa Blase. Tofu (firm) and tempeh. Limited amounts of nuts and seeds, such as almonds, walnuts, [place] nuts, pecans, peanuts, nut butters, pumpkin seeds, chia seeds, and sunflower seeds. ?Dairy ?Lactose-free milk, yogurt, and kefir. Lactose-free cottage cheese and ice cream. Non-dairy milks, such as almond, coconut, hemp, and rice milk. Non-dairy yogurt. Limited amounts of goat cheese, brie, mozzarella, parmesan, swiss, and other hard cheeses. ?Fats and oils ?Butter-free spreads. Vegetable oils, such as olive, canola, and sunflower oil. ?Seasoning and other foods ?Artificial sweeteners with names that do not end in "ol," such as aspartame, saccharine, and stevia. Maple syrup, white table sugar,  raw sugar, brown sugar, and molasses. Mayonnaise, soy sauce, and tamari. Fresh basil, coriander, parsley,  rosemary, and thyme. ?Beverages ?Water and mineral water. Sugar-sweetened soft drinks. Small amounts of orange juice or cranberry juice. Black and green tea. Most dry wines. Coffee. ?The items listed above may not be a complete list of foods and beverages you can eat. Contact a dietitian for more information. ?Foods to avoid ?Fruits ?Fresh, dried, and juiced forms of apple, pear, watermelon, peach, plum, cherries, apricots, blackberries, boysenberries, figs, nectarines, and mango. Avocado. ?Vegetables ?Chicory root, artichoke, asparagus, cabbage, snow peas, Brussels sprouts, broccoli, sugar snap peas, mushrooms, celery, and cauliflower. Onions, garlic, leeks, and the white part of scallions. ?Grains ?Wheat, including kamut, durum, and semolina. Barley and bulgur. Couscous. Wheat-based cereals. Wheat noodles, bread, crackers, and pastries. ?Meats and other proteins ?Fried or fatty meat. Sausage. Cashews and pistachios. Soybeans, baked beans, black beans, chickpeas, kidney beans, fava beans, navy beans, lentils, black-eyed peas, and split peas. ?Dairy ?Milk, yogurt, ice cream, and soft cheese. Cream and sour cream. Milk-based sauces. Custard. Buttermilk. Soy milk. ?Seasoning and other foods ?Any sugar-free gum or candy. Foods that contain artificial sweeteners such as sorbitol, mannitol, isomalt, or xylitol. Foods that contain honey, high-fructose corn syrup, or agave. Bouillon, vegetable stock, beef stock, and chicken stock. Garlic and onion powder. Condiments made with onion, such as hummus, chutney, pickles, relish, salad dressing, and salsa. Tomato paste. ?Beverages ?Chicory-based drinks. Coffee substitutes. Chamomile tea. Fennel tea. Sweet or fortified wines such as port or sherry. Diet soft drinks made with isomalt, mannitol, maltitol, sorbitol, or xylitol. Apple, pear, and mango juice. Juices with high-fructose corn syrup. ?The items listed above may not be a complete list of foods and beverages you should  avoid. Contact a dietitian for more information. ?Summary ?FODMAP stands for fermentable oligosaccharides, disaccharides, monosaccharides, and polyols. These are sugars that are hard for some people to digest. ?A low-FODMAP eating plan is a Awan-term diet that helps to ease symptoms of certain bowel diseases. ?The eating plan usually lasts up to 6 weeks. After that, high-FODMAP foods are reintroduced gradually and one at a time. This can help you find out which foods may be causing symptoms. ?A low-FODMAP eating plan can be complicated. It is best to work with a dietitian who has experience with this type of plan. ?This information is not intended to replace advice given to you by your health care provider. Make sure you discuss any questions you have with your health care provider. ?Document Revised: 02/23/2020 Document Reviewed: 02/23/2020 ?Elsevier Patient Education ? 2023 Elsevier Inc. ? ? ? ?

## 2022-03-05 DIAGNOSIS — Z79899 Other long term (current) drug therapy: Secondary | ICD-10-CM | POA: Diagnosis not present

## 2022-03-05 DIAGNOSIS — B351 Tinea unguium: Secondary | ICD-10-CM | POA: Diagnosis not present

## 2022-03-14 ENCOUNTER — Encounter: Payer: Self-pay | Admitting: Nurse Practitioner

## 2022-03-14 ENCOUNTER — Ambulatory Visit (INDEPENDENT_AMBULATORY_CARE_PROVIDER_SITE_OTHER): Payer: Medicare Other | Admitting: Nurse Practitioner

## 2022-03-14 VITALS — BP 126/86 | HR 81 | Temp 97.5°F | Ht 64.0 in | Wt 138.0 lb

## 2022-03-14 DIAGNOSIS — M8589 Other specified disorders of bone density and structure, multiple sites: Secondary | ICD-10-CM

## 2022-03-14 DIAGNOSIS — M810 Age-related osteoporosis without current pathological fracture: Secondary | ICD-10-CM

## 2022-03-14 NOTE — Assessment & Plan Note (Addendum)
I spent 30mins to review Last dexa scan 2023: -3.25 in humerus, -2.7 in femur, her risk of fracture and possible complications from a fracture, the need for prolia injection, its possible pros and cons, and need for weight bearing exercise calcium and vit. D in combination to prolia injection. Side effects she is concerned about: jaw fracture, URI, constipation, joint pain, and rash-psoriasis). We discussed the percentage reported for each side of those side effects. I advised to consider the pros vs cons of prolia. Side effects are reported for all medications, but do not always affect everyone. I answered all her questions to her satisfaction. She decided to talked to her son about injection before making a decision.

## 2022-03-14 NOTE — Progress Notes (Unsigned)
Established Patient Visit  Patient: Nancy Neal   DOB: 03/08/1932   88 y.o. Female  MRN: [id number] Visit Date: 03/14/2022  Subjective:    Chief Complaint  Patient presents with   Acute Visit    Wants to discuss getting the prolia injection. No other concerns.   HPI Osteopenia of multiple sites I spent to review Last dexa scan 2023: -3.25 in humerus, -2.7 in femur, her risk of fracture and possible complications from a fracture, the need for prolia injection, its possible pros and cons, and need for weight bearing exercise calcium and vit. D in combination to prolia injection. Side effects she is concerned about: jaw fracture, URI, constipation, joint pain, and rash-psoriasis). We discussed the percentage reported for each side of those side effects. I advised to consider the pros vs cons of prolia. Side effects are reported for all medications, but do not always affect everyone. I answered all her questions to her satisfaction. She decided to talked to her son about injection before making a decision.  Reviewed medical, surgical, and social history today  Medications: Outpatient Medications Prior to Visit  Medication Sig   acetaminophen (TYLENOL) 325 MG tablet Take 2 tablets (650 mg total) by mouth every 4 (four) hours as needed for headache or mild pain.   Ascorbic Acid (VITAMIN C ER PO) Take 1 tablet by mouth daily.   aspirin 81 MG chewable tablet Chew 1 tablet (81 mg total) by mouth daily.   atorvastatin (LIPITOR) 10 MG tablet TAKE 1 TABLET(10 MG) BY MOUTH DAILY (Patient taking differently: Take 10 mg by mouth daily.)   carboxymethylcellulose (REFRESH PLUS) 0.5 % SOLN Place 1 drop into both eyes 3 (three) times daily as needed (dry eyes).    MAGNESIUM PO Take 1 tablet by mouth daily.   Multiple Vitamin (MULTIVITAMIN WITH MINERALS) TABS tablet Take 1 tablet by mouth daily.   vitamin B-12 (CYANOCOBALAMIN) 100 MCG tablet Take 100 mcg by mouth daily.   vitamin E  1000 UNIT capsule Take 1,000 Units by mouth daily.   [DISCONTINUED] pantoprazole (PROTONIX) 40 MG tablet Take 1 tablet (40 mg total) by mouth daily. (Patient not taking: Reported on 03/14/2022)   No facility-administered medications prior to visit.   Reviewed past medical and social history.   ROS per HPI above  {Show previous labs (optional):23779}    Objective:  BP 126/86 (BP Location: Right Arm, Patient Position: Sitting, Cuff Size: Normal)   Pulse 81   Temp (!) 97.5 F (36.4 C) (Temporal)   Ht 5\' 4"  (1.626 m)   Wt 138 lb (62.6 kg)   SpO2 93%   BMI 23.69 kg/m      Physical Exam Cardiovascular:     Rate and Rhythm: Normal rate.     Pulses: Normal pulses.  Pulmonary:     Effort: Pulmonary effort is normal.  Neurological:     Mental Status: She is alert and oriented to person, place, and time.  Psychiatric:        Mood and Affect: Mood normal.        Behavior: Behavior normal.        Thought Content: Thought content normal.    No results found for any visits on 03/14/22.    Assessment & Plan:    Problem List Items Addressed This Visit       Musculoskeletal and Integument   Osteopenia of multiple sites - Primary  I spent to review Last dexa scan 2023: -3.25 in humerus, -2.7 in femur, her risk of fracture and possible complications from a fracture, the need for prolia injection, its possible pros and cons, and need for weight bearing exercise calcium and vit. D in combination to prolia injection. Side effects she is concerned about: jaw fracture, URI, constipation, joint pain, and rash-psoriasis). We discussed the percentage reported for each side of those side effects. I advised to consider the pros vs cons of prolia. Side effects are reported for all medications, but do not always affect everyone. I answered all her questions to her satisfaction. She decided to talked to her son about injection before making a decision.       I have spent with this  patient regarding history taking, documentation, review of radiology report and medications, formulating plan and discussing treatment options with patient.   Return if symptoms worsen or fail to improve.     Alysia Penna, NP

## 2022-03-14 NOTE — Patient Instructions (Signed)
Left me know if you decide to start prolia injection  Denosumab injection What is this medication? DENOSUMAB (den oh sue mab) slows bone breakdown. Prolia is used to treat osteoporosis in women after menopause and in men, and in people who are taking corticosteroids for 6 months or more. Delton See is used to treat a high calcium level due to cancer and to prevent bone fractures and other bone problems caused by multiple myeloma or cancer bone metastases. Delton See is also used to treat giant cell tumor of the bone. This medicine may be used for other purposes; ask your health care provider or pharmacist if you have questions. COMMON BRAND NAME(S): Prolia, XGEVA What should I tell my care team before I take this medication? They need to know if you have any of these conditions: dental disease having surgery or tooth extraction infection kidney disease low levels of calcium or Vitamin D in the blood malnutrition on hemodialysis skin conditions or sensitivity thyroid or parathyroid disease an unusual reaction to denosumab, other medicines, foods, dyes, or preservatives pregnant or trying to get pregnant breast-feeding How should I use this medication? This medicine is for injection under the skin. It is given by a health care professional in a hospital or clinic setting. A special MedGuide will be given to you before each treatment. Be sure to read this information carefully each time. For Prolia, talk to your pediatrician regarding the use of this medicine in children. Special care may be needed. For Delton See, talk to your pediatrician regarding the use of this medicine in children. While this drug may be prescribed for children as young as 13 years for selected conditions, precautions do apply. Overdosage: If you think you have taken too much of this medicine contact a poison control center or emergency room at once. NOTE: This medicine is only for you. Do not share this medicine with others. What if  I miss a dose? It is important not to miss your dose. Call your doctor or health care professional if you are unable to keep an appointment. What may interact with this medication? Do not take this medicine with any of the following medications: other medicines containing denosumab This medicine may also interact with the following medications: medicines that lower your chance of fighting infection steroid medicines like prednisone or cortisone This list may not describe all possible interactions. Give your health care provider a list of all the medicines, herbs, non-prescription drugs, or dietary supplements you use. Also tell them if you smoke, drink alcohol, or use illegal drugs. Some items may interact with your medicine. What should I watch for while using this medication? Visit your doctor or health care professional for regular checks on your progress. Your doctor or health care professional may order blood tests and other tests to see how you are doing. Call your doctor or health care professional for advice if you get a fever, chills or sore throat, or other symptoms of a cold or flu. Do not treat yourself. This drug may decrease your body's ability to fight infection. Try to avoid being around people who are sick. You should make sure you get enough calcium and vitamin D while you are taking this medicine, unless your doctor tells you not to. Discuss the foods you eat and the vitamins you take with your health care professional. See your dentist regularly. Brush and floss your teeth as directed. Before you have any dental work done, tell your dentist you are receiving this medicine. Do not  become pregnant while taking this medicine or for 5 months after stopping it. Talk with your doctor or health care professional about your birth control options while taking this medicine. Women should inform their doctor if they wish to become pregnant or think they might be pregnant. There is a potential  for serious side effects to an unborn child. Talk to your health care professional or pharmacist for more information. What side effects may I notice from receiving this medication? Side effects that you should report to your doctor or health care professional as soon as possible: allergic reactions like skin rash, itching or hives, swelling of the face, lips, or tongue bone pain breathing problems dizziness jaw pain, especially after dental work redness, blistering, peeling of the skin signs and symptoms of infection like fever or chills; cough; sore throat; pain or trouble passing urine signs of low calcium like fast heartbeat, muscle cramps or muscle pain; pain, tingling, numbness in the hands or feet; seizures unusual bleeding or bruising unusually weak or tired Side effects that usually do not require medical attention (report to your doctor or health care professional if they continue or are bothersome): constipation diarrhea headache joint pain loss of appetite muscle pain runny nose tiredness upset stomach This list may not describe all possible side effects. Call your doctor for medical advice about side effects. You may report side effects to FDA at 1-800-FDA-1088. Where should I keep my medication? This medicine is only given in a clinic, doctor's office, or other health care setting and will not be stored at home. NOTE: This sheet is a summary. It may not cover all possible information. If you have questions about this medicine, talk to your doctor, pharmacist, or health care provider.  2023 Elsevier/Gold Standard (2018-02-12 00:00:00)

## 2022-04-07 ENCOUNTER — Other Ambulatory Visit: Payer: Self-pay

## 2022-04-07 MED ORDER — ATORVASTATIN CALCIUM 10 MG PO TABS: ORAL_TABLET | ORAL | 2 refills | Status: DC

## 2022-04-14 ENCOUNTER — Telehealth: Payer: Self-pay | Admitting: Nurse Practitioner

## 2022-04-14 DIAGNOSIS — M810 Age-related osteoporosis without current pathological fracture: Secondary | ICD-10-CM

## 2022-04-14 NOTE — Telephone Encounter (Signed)
Referral to endocrinology is placed if she has failed typical treatment with a Bisphosphonate or Prolia injection.  I do not have a reason to send her to endocrinology at this time when she has not attempted any treatment. We discussed the different treatment options during her last office visit.

## 2022-05-07 DIAGNOSIS — H401122 Primary open-angle glaucoma, left eye, moderate stage: Secondary | ICD-10-CM | POA: Diagnosis not present

## 2022-05-07 DIAGNOSIS — H5203 Hypermetropia, bilateral: Secondary | ICD-10-CM | POA: Diagnosis not present

## 2022-05-07 DIAGNOSIS — H52203 Unspecified astigmatism, bilateral: Secondary | ICD-10-CM | POA: Diagnosis not present

## 2022-05-07 DIAGNOSIS — H401111 Primary open-angle glaucoma, right eye, mild stage: Secondary | ICD-10-CM | POA: Diagnosis not present

## 2022-05-07 DIAGNOSIS — H524 Presbyopia: Secondary | ICD-10-CM | POA: Diagnosis not present

## 2022-05-07 DIAGNOSIS — Z961 Presence of intraocular lens: Secondary | ICD-10-CM | POA: Diagnosis not present

## 2022-05-27 DIAGNOSIS — M25512 Pain in left shoulder: Secondary | ICD-10-CM | POA: Diagnosis not present

## 2022-06-17 ENCOUNTER — Encounter (HOSPITAL_COMMUNITY): Payer: Self-pay | Admitting: Emergency Medicine

## 2022-06-17 ENCOUNTER — Other Ambulatory Visit: Payer: Self-pay

## 2022-06-17 ENCOUNTER — Emergency Department (HOSPITAL_COMMUNITY)
Admission: EM | Admit: 2022-06-17 | Discharge: 2022-06-17 | Disposition: A | Discharge status: Home / Self Care | Payer: Medicare Other | Attending: Emergency Medicine | Admitting: Emergency Medicine

## 2022-06-17 ENCOUNTER — Emergency Department (HOSPITAL_COMMUNITY): Payer: Medicare Other

## 2022-06-17 DIAGNOSIS — R110 Nausea: Secondary | ICD-10-CM | POA: Diagnosis not present

## 2022-06-17 DIAGNOSIS — R040 Epistaxis: Secondary | ICD-10-CM | POA: Diagnosis not present

## 2022-06-17 DIAGNOSIS — I2510 Atherosclerotic heart disease of native coronary artery without angina pectoris: Secondary | ICD-10-CM | POA: Diagnosis not present

## 2022-06-17 DIAGNOSIS — Z7982 Long term (current) use of aspirin: Secondary | ICD-10-CM | POA: Diagnosis not present

## 2022-06-17 DIAGNOSIS — R9431 Abnormal electrocardiogram [ECG] [EKG]: Secondary | ICD-10-CM | POA: Diagnosis not present

## 2022-06-17 LAB — COMPREHENSIVE METABOLIC PANEL
ALT: 19 U/L (ref 0–44)
AST: 23 U/L (ref 15–41)
Albumin: 3.8 g/dL (ref 3.5–5.0)
Alkaline Phosphatase: 74 U/L (ref 38–126)
Anion gap: 8 (ref 5–15)
BUN: 28 mg/dL — ABNORMAL HIGH (ref 8–23)
CO2: 24 mmol/L (ref 22–32)
Calcium: 9.4 mg/dL (ref 8.9–10.3)
Chloride: 107 mmol/L (ref 98–111)
Creatinine, Ser: 1.06 mg/dL — ABNORMAL HIGH (ref 0.44–1.00)
GFR, Estimated: 51 mL/min — ABNORMAL LOW (ref >60)
Glucose, Bld: 106 mg/dL — ABNORMAL HIGH (ref 70–99)
Potassium: 4.1 mmol/L (ref 3.5–5.1)
Sodium: 139 mmol/L (ref 135–145)
Total Bilirubin: 0.6 mg/dL (ref 0.3–1.2)
Total Protein: 5.9 g/dL — ABNORMAL LOW (ref 6.5–8.1)

## 2022-06-17 LAB — URINALYSIS, ROUTINE W REFLEX MICROSCOPIC
Bacteria, UA: NONE SEEN
Bilirubin Urine: NEGATIVE
Glucose, UA: NEGATIVE mg/dL
Hgb urine dipstick: NEGATIVE
Ketones, ur: NEGATIVE mg/dL
Nitrite: NEGATIVE
Protein, ur: NEGATIVE mg/dL
Specific Gravity, Urine: 1.015 (ref 1.005–1.030)
pH: 7 (ref 5.0–8.0)

## 2022-06-17 LAB — TROPONIN I (HIGH SENSITIVITY)
Troponin I (High Sensitivity): 10 ng/L (ref <18)
Troponin I (High Sensitivity): 8 ng/L (ref <18)

## 2022-06-17 LAB — CBC
HCT: 44.2 % (ref 36.0–46.0)
Hemoglobin: 14.2 g/dL (ref 12.0–15.0)
MCH: 31.4 pg (ref 26.0–34.0)
MCHC: 32.1 g/dL (ref 30.0–36.0)
MCV: 97.8 fL (ref 80.0–100.0)
Platelets: 248 10*3/uL (ref 150–400)
RBC: 4.52 MIL/uL (ref 3.87–5.11)
RDW: 13.8 % (ref 11.5–15.5)
WBC: 5.1 10*3/uL (ref 4.0–10.5)
nRBC: 0 % (ref 0.0–0.2)

## 2022-06-17 LAB — LIPASE, BLOOD: Lipase: 35 U/L (ref 11–51)

## 2022-06-17 MED ORDER — ONDANSETRON 4 MG PO TBDP
4.0000 mg | ORAL_TABLET | Freq: Once | ORAL | Status: AC | PRN
  Administered 2022-06-17: 4 mg via ORAL
  Filled 2022-06-17: qty 1

## 2022-06-17 NOTE — ED Triage Notes (Signed)
Pt presents for nausea that started last night. Took 3 tums last night but awoke again this morning nauseated and with a bloody nose. Epistaxis appears controlled pta. Denies emesis, diaphoresis, CP.  H/o MI with atypical symptoms.

## 2022-06-17 NOTE — ED Provider Notes (Signed)
[facility] EMERGENCY DEPARTMENT Provider Note   CSN: [id number] Arrival date & time: 06/17/22  4098     History  Chief Complaint  Patient presents with   Nausea    Nancy Neal is a 88 y.o. female.  HPI Patient presents for nausea.  Medical history includes CAD, vertigo, arthritis, GERD, diverticulosis, urgent and stress incontinence.  She experienced what she calls indigestion last night.  She describes her indigestion as nausea without pain.  She took 3 Tums last night.  She woke at 3 AM with persistent nausea as well as epistaxis.  Epistaxis was able to be controlled at home.  Due to her persistent symptoms of nausea, she came to the ED.  She reports currently that symptoms have resolved.  She denies any current symptoms.    Home Medications Prior to Admission medications   Medication Sig Start Date End Date Taking? Authorizing Provider  acetaminophen (TYLENOL) 325 MG tablet Take 2 tablets (650 mg total) by mouth every 4 (four) hours as needed for headache or mild pain. 12/17/15   Abelino Derrick, PA-C  Ascorbic Acid (VITAMIN C ER PO) Take 1 tablet by mouth daily.    [provider]  aspirin 81 MG chewable tablet Chew 1 tablet (81 mg total) by mouth daily. 12/17/15   Abelino Derrick, PA-C  atorvastatin (LIPITOR) 10 MG tablet TAKE 1 TABLET(10 MG) BY MOUTH DAILY Strength: 10 mg 04/07/22   Lyn Records, MD  carboxymethylcellulose (REFRESH PLUS) 0.5 % SOLN Place 1 drop into both eyes 3 (three) times daily as needed (dry eyes).     [provider]  MAGNESIUM PO Take 1 tablet by mouth daily.    [provider]  Multiple Vitamin (MULTIVITAMIN WITH MINERALS) TABS tablet Take 1 tablet by mouth daily.    [provider]  vitamin B-12 (CYANOCOBALAMIN) 100 MCG tablet Take 100 mcg by mouth daily.    [provider]  vitamin E 1000 UNIT capsule Take 1,000 Units by mouth daily.    [provider]      Allergies    Codeine  sulfate, Meclizine, Acetaminophen, Morphine, Other, and Tramadol    Review of Systems   Review of Systems  HENT:  Positive for nosebleeds.   Gastrointestinal:  Positive for nausea.  All other systems reviewed and are negative.   Physical Exam Updated Vital Signs BP 128/77   Pulse 79   Temp 97.6 F (36.4 C) (Oral)   Resp 19   Wt 60.3 kg   SpO2 98%   BMI 22.83 kg/m  Physical Exam Vitals and nursing note reviewed.  Constitutional:      General: She is not in acute distress.    Appearance: Normal appearance. She is well-developed. She is not ill-appearing, toxic-appearing or diaphoretic.  HENT:     Head: Normocephalic and atraumatic.     Right Ear: External ear normal.     Left Ear: External ear normal.     Nose: Nose normal.     Mouth/Throat:     Mouth: Mucous membranes are moist.     Pharynx: Oropharynx is clear.  Eyes:     Extraocular Movements: Extraocular movements intact.     Conjunctiva/sclera: Conjunctivae normal.  Cardiovascular:     Rate and Rhythm: Normal rate and regular rhythm.     Heart sounds: No murmur heard. Pulmonary:     Effort: Pulmonary effort is normal. No respiratory distress.     Breath sounds: Normal breath  sounds. No wheezing or rales.  Chest:     Chest wall: No tenderness.  Abdominal:     Palpations: Abdomen is soft.     Tenderness: There is abdominal tenderness (Mild suprapubic tenderness).  Musculoskeletal:        General: No swelling. Normal range of motion.     Cervical back: Normal range of motion and neck supple.     Right lower leg: No edema.     Left lower leg: No edema.  Skin:    General: Skin is warm and dry.     Coloration: Skin is not jaundiced or pale.  Neurological:     General: No focal deficit present.     Mental Status: She is alert and oriented to person, place, and time.     Cranial Nerves: No cranial nerve deficit.     Sensory: No sensory deficit.     Motor: No weakness.     Coordination: Coordination normal.   Psychiatric:        Mood and Affect: Mood normal.        Behavior: Behavior normal.        Thought Content: Thought content normal.        Judgment: Judgment normal.     ED Results / Procedures / Treatments   Labs (all labs ordered are listed, but only abnormal results are displayed) Labs Reviewed  COMPREHENSIVE METABOLIC PANEL - Abnormal; Notable for the following components:      Result Value   Glucose, Bld 106 (*)    BUN 28 (*)    Creatinine, Ser 1.06 (*)    Total Protein 5.9 (*)    GFR, Estimated 51 (*)    All other components within normal limits  URINALYSIS, ROUTINE W REFLEX MICROSCOPIC - Abnormal; Notable for the following components:   Leukocytes,Ua TRACE (*)    All other components within normal limits  LIPASE, BLOOD  CBC  TROPONIN I (HIGH SENSITIVITY)  TROPONIN I (HIGH SENSITIVITY)    EKG EKG Interpretation  Date/Time:  Tuesday June 17 2022 04:59:37 EDT Ventricular Rate:  79 PR Interval:  158 QRS Duration: 78 QT Interval:  372 QTC Calculation: 426 R Axis:   0 Text Interpretation: Normal sinus rhythm with sinus arrhythmia Confirmed by Gloris Manchester ([id number]) on 06/17/2022 8:57:17 AM  Radiology No results found.  Procedures Procedures    Medications Ordered in ED Medications  ondansetron (ZOFRAN-ODT) disintegrating tablet 4 mg (4 mg Oral Given 06/17/22 0456)    ED Course/ Medical Decision Making/ A&P                           Medical Decision Making Amount and/or Complexity of Data Reviewed Labs: ordered. Radiology: ordered.  Risk Prescription drug management.   This patient presents to the ED for concern of nausea, this involves an extensive number of treatment options, and is a complaint that carries with it a high risk of complications and morbidity.  The differential diagnosis includes GERD, ACS, infection, anemia, foodborne illness, enteritis pneumonia   Co morbidities that complicate the patient evaluation  CAD, vertigo, arthritis, GERD,  diverticulosis, urgent and stress incontinence   Additional history obtained:  Additional history obtained from N/A External records from outside source obtained and reviewed including EMR   Lab Tests:  I Ordered, and personally interpreted labs.  The pertinent results include: Baseline creatinine, normal electrolytes, normal troponin, no evidence of UTI, normal hemoglobin, no leukocytosis   Imaging Studies  ordered:  I ordered imaging studies including chest x-ray I independently visualized and interpreted imaging which showed no acute findings I agree with the radiologist interpretation   Cardiac Monitoring: / EKG:  The patient was maintained on a cardiac monitor.  I personally viewed and interpreted the cardiac monitored which showed an underlying rhythm of: Sinus rhythm   Problem List / ED Course / Critical interventions / Medication management  Patient is a healthy 88 year old female presenting for symptoms of nausea overnight.  She also endorses a transient episode of epistaxis.  Prior to being bedded in the ED, patient underwent laboratory work-up.  EKG shows no ST segment abnormalities.  Troponin was normal.  Patient found to have normal electrolytes, normal kidney function, and no leukocytosis.  On exam, she is well-appearing.  Although she endorses some mild suprapubic tenderness, urinalysis does not show any evidence of infection.  She does report resolution of symptoms since she came to the ED.  Chest x-ray shows no acute findings.  Patient was given reassurance and discharged in good condition.   Social Determinants of Health:  Has access to outpatient care        Final Clinical Impression(s) / ED Diagnoses Final diagnoses:  Nausea    Rx / DC Orders ED Discharge Orders     None         Gloris Manchester, MD 06/17/22 [phone]

## 2022-06-17 NOTE — Discharge Instructions (Addendum)
Your lab work, EKG, and chest x-ray are all reassuring.  Return to the emergency department at any time for any new or worsening symptoms of concern.

## 2022-06-19 ENCOUNTER — Telehealth: Payer: Self-pay

## 2022-06-19 NOTE — Telephone Encounter (Signed)
Transition Care Management Follow-up Telephone Call Date of discharge and from where: 06/17/2022 [facility] How have you been since you were released from the hospital? I'm ok, nothing was wrong with me. Any questions or concerns? No  Items Reviewed: Did the pt receive and understand the discharge instructions provided? Yes  Medications obtained and verified?  N/a Other?  N/a Any new allergies since your discharge? No  Dietary orders reviewed? Yes Do you have support at home? Yes   Home Care and Equipment/Supplies: Were home health services ordered? not applicable If so, what is the name of the agency? N/a  Has the agency set up a time to come to the patient's home? not applicable Were any new equipment or medical supplies ordered?  No What is the name of the medical supply agency? N/A Were you able to get the supplies/equipment? not applicable Do you have any questions related to the use of the equipment or supplies? No  Functional Questionnaire: (I = Independent and D = Dependent) ADLs: D  Bathing/Dressing- D  Meal Prep- D  Eating- D  Maintaining continence- D  Transferring/Ambulation- D  Managing Meds- D  Follow up appointments reviewed:  PCP Hospital f/u appt confirmed? No  Scheduled to see N/A on N/A @ N/A. Pt has a New Patient appt schedule with [facility] w/ Dr. Okey Dupre 08/05/2022 Specialist Hospital f/u appt confirmed? No  Scheduled to see N/A on N/A @ N/A. Are transportation arrangements needed? No  If their condition worsens, is the pt aware to call PCP or go to the Emergency Dept.? Yes Was the patient provided with contact information for the PCP's office or ED? Yes Was to pt encouraged to call back with questions or concerns? Yes  Pt instructed to give [name] a call at the [place] office if she needs anything between now and her new pt appt at [facility].  Arvil Persons, RN, BSN

## 2022-06-25 DIAGNOSIS — M25512 Pain in left shoulder: Secondary | ICD-10-CM | POA: Diagnosis not present

## 2022-07-04 ENCOUNTER — Encounter: Payer: Self-pay | Admitting: Interventional Cardiology

## 2022-08-05 ENCOUNTER — Ambulatory Visit (INDEPENDENT_AMBULATORY_CARE_PROVIDER_SITE_OTHER): Payer: Medicare Other | Admitting: Internal Medicine

## 2022-08-05 ENCOUNTER — Encounter: Payer: Self-pay | Admitting: Internal Medicine

## 2022-08-05 DIAGNOSIS — I25119 Atherosclerotic heart disease of native coronary artery with unspecified angina pectoris: Secondary | ICD-10-CM | POA: Diagnosis not present

## 2022-08-05 DIAGNOSIS — K580 Irritable bowel syndrome with diarrhea: Secondary | ICD-10-CM | POA: Diagnosis not present

## 2022-08-05 DIAGNOSIS — K589 Irritable bowel syndrome without diarrhea: Secondary | ICD-10-CM | POA: Insufficient documentation

## 2022-08-05 DIAGNOSIS — R1030 Lower abdominal pain, unspecified: Secondary | ICD-10-CM | POA: Diagnosis not present

## 2022-08-05 DIAGNOSIS — R109 Unspecified abdominal pain: Secondary | ICD-10-CM | POA: Insufficient documentation

## 2022-08-05 MED ORDER — DICYCLOMINE HCL 10 MG PO CAPS: 10.0000 mg | ORAL_CAPSULE | Freq: Three times a day (TID) | ORAL | 0 refills | Status: DC

## 2022-08-05 NOTE — Assessment & Plan Note (Signed)
It was a little difficult upon first meeting which symptoms were chronic for patient and which were new. She has fodmap information from GI which she has not been following. We discussed artificial sugars can cause gas and bloating and she should avoid. She does mostly lactose free products and this has helped her stomach symptoms. Advised to call GI for follow up if her current symptoms are not improving.

## 2022-08-05 NOTE — Assessment & Plan Note (Signed)
Suspect she could have diverticulitis based on location and pain with diarrhea. This is improving without treatment so will defer labs and imaging today. If worsens or does not continue to improve can consider these. We had a lengthy conversation about diet and activity. Rx bentyl 10 mg QID prn to use for pain. Advised she can continue to take imodium. Prior moderate diverticulosis on colonoscopy 2017 no further indicated due to age.

## 2022-08-05 NOTE — Progress Notes (Signed)
   Subjective:   Patient ID: Nancy Neal, female    DOB: 14-Aug-1932, 88 y.o.   MRN: [id number]  HPI The patient is an 88 YO TOC coming in for concerns and ongoing care. New problems with bowels in the last few days with pain lower stomach and liquid stools Saturday. Pain is gradually improving and no diarrhea today or yesterday. Took imodium yesterday.  PMH, [facility], social history reviewed and updated  Review of Systems  Constitutional:  Positive for appetite change.  HENT: Negative.    Eyes: Negative.   Respiratory:  Negative for cough, chest tightness and shortness of breath.   Cardiovascular:  Negative for chest pain, palpitations and leg swelling.  Gastrointestinal:  Positive for abdominal pain and diarrhea. Negative for abdominal distention, constipation, nausea and vomiting.  Musculoskeletal: Negative.   Skin: Negative.   Neurological: Negative.   Psychiatric/Behavioral: Negative.      Objective:  Physical Exam Constitutional:      Appearance: She is well-developed.  HENT:     Head: Normocephalic and atraumatic.  Cardiovascular:     Rate and Rhythm: Normal rate and regular rhythm.  Pulmonary:     Effort: Pulmonary effort is normal. No respiratory distress.     Breath sounds: Normal breath sounds. No wheezing or rales.  Abdominal:     General: Bowel sounds are normal. There is no distension.     Palpations: Abdomen is soft.     Tenderness: There is abdominal tenderness. There is no rebound.     Comments: Lower abdomen tenderness, no guarding or rebound  Musculoskeletal:     Cervical back: Normal range of motion.  Skin:    General: Skin is warm and dry.  Neurological:     Mental Status: She is alert and oriented to person, place, and time.     Coordination: Coordination normal.     Vitals:   08/05/22 0809  BP: (!) 140/80  Pulse: 89  Temp: (!) 97.5 F (36.4 C)  TempSrc: Oral  SpO2: 98%  Weight: 133 lb 4 oz (60.4 kg)  Height: 5\' 4"  (1.626 m)    Assessment &  Plan:  Visit time 35 minutes in face to face communication with patient and coordination of care, additional 15 minutes spent in record review, coordination or care, ordering tests, communicating/referring to other healthcare professionals, documenting in medical records all on the same day of the visit for total time 50 minutes spent on the visit.

## 2022-08-05 NOTE — Patient Instructions (Signed)
We have sent in bentyl which is the spasm medicine to use up to 4 times a day for the pain.  Let us know if you are not improving in 3-5 days.

## 2022-08-07 ENCOUNTER — Telehealth: Payer: Self-pay

## 2022-08-07 ENCOUNTER — Telehealth: Payer: Self-pay | Admitting: Internal Medicine

## 2022-08-07 NOTE — Telephone Encounter (Signed)
Spoke with the patient again. Moved her appointment to 08/22/22 with Dr Lorenso Courier.

## 2022-08-07 NOTE — Telephone Encounter (Signed)
Pt called and left a vm regarding a missed call from this office. Current referral on file.

## 2022-08-07 NOTE — Telephone Encounter (Signed)
Patient returned your call, requesting a call back.  

## 2022-08-07 NOTE — Telephone Encounter (Signed)
Patient called requesting to speak with a nurse regarding some recent bowel habit changes that she is concerned about.

## 2022-08-07 NOTE — Telephone Encounter (Signed)
Patient seen by PCP 08/05/22. She is having spells of loose stools and abdominal pain. She was given dicyclomine for abdominal discomfort. She had told the PCP she was improving. Has used Imodium. She is scheduled to see APP on 09/05/22. I told the patient I will monitor for a sooner appointment with Dr Lorenso Courier or an APP. Follow the PCP instructions. Call if she acutely worsens.

## 2022-08-22 ENCOUNTER — Encounter: Payer: Self-pay | Admitting: Internal Medicine

## 2022-08-22 ENCOUNTER — Ambulatory Visit (INDEPENDENT_AMBULATORY_CARE_PROVIDER_SITE_OTHER): Payer: Medicare Other | Admitting: Internal Medicine

## 2022-08-22 VITALS — BP 142/76 | HR 81 | Ht 64.0 in | Wt 135.0 lb

## 2022-08-22 DIAGNOSIS — R140 Abdominal distension (gaseous): Secondary | ICD-10-CM | POA: Diagnosis not present

## 2022-08-22 DIAGNOSIS — R109 Unspecified abdominal pain: Secondary | ICD-10-CM

## 2022-08-22 DIAGNOSIS — K638219 Small intestinal bacterial overgrowth, unspecified: Secondary | ICD-10-CM | POA: Diagnosis not present

## 2022-08-22 MED ORDER — RIFAXIMIN 550 MG PO TABS: 550.0000 mg | ORAL_TABLET | Freq: Three times a day (TID) | ORAL | 0 refills | Status: AC

## 2022-08-22 NOTE — Patient Instructions (Addendum)
_______________________________________________________  If you are age 86 or older, your body mass index should be between 23-30. Your Body mass index is 23.17 kg/m. If this is out of the aforementioned range listed, please consider follow up with your Primary Care Provider.  If you are age 86 or younger, your body mass index should be between 19-25. Your Body mass index is 23.17 kg/m. If this is out of the aformentioned range listed, please consider follow up with your Primary Care Provider.   ________________________________________________________  The [facility] providers would like to encourage you to use [facility] to communicate with providers for non-urgent requests or questions.  Due to long hold times on the telephone, sending your provider a message by [facility] may be a faster and more efficient way to get a response.  Please allow 48 business hours for a response.  Please remember that this is for non-urgent requests.  _______________________________________________________   Samples of Xifaxan 550 mg given today take 1 tablet 3 times daily for 14 days.  You are schedule to follow up on 09/30/22 at 3:40 pm.  Thank you for entrusting me with your care and choosing [facility].  Dr Lorenso Courier

## 2022-08-22 NOTE — Progress Notes (Signed)
Chief Complaint: Abdominal pain  HPI : 88 year old female with history of GERD, CAD, glaucoma presents for follow up of abdominal pain.  Interval History: She is having some ab pain and gas that come and goes. She stopped taking the fiber because it was causing a lot of flatulence. Her bowels are sensitive. She does try to avoid dairy but still eats some yogurt. She is having some diarrhea. On average she has 1-3 BMs per day. She is no longer taking anything for reflux. Gas-X has been helpful.  Current Outpatient Medications  Medication Sig Dispense Refill   acetaminophen (TYLENOL) 325 MG tablet Take 2 tablets (650 mg total) by mouth every 4 (four) hours as needed for headache or mild pain.     Ascorbic Acid (VITAMIN C ER PO) Take 1 tablet by mouth daily.     aspirin 81 MG chewable tablet Chew 1 tablet (81 mg total) by mouth daily.     atorvastatin (LIPITOR) 10 MG tablet TAKE 1 TABLET(10 MG) BY MOUTH DAILY Strength: 10 mg 90 tablet 2   carboxymethylcellulose (REFRESH PLUS) 0.5 % SOLN Place 1 drop into both eyes 3 (three) times daily as needed (dry eyes).      MAGNESIUM PO Take 1 tablet by mouth daily.     Multiple Vitamin (MULTIVITAMIN WITH MINERALS) TABS tablet Take 1 tablet by mouth daily.     vitamin B-12 (CYANOCOBALAMIN) 100 MCG tablet Take 100 mcg by mouth daily.     vitamin E 1000 UNIT capsule Take 1,000 Units by mouth daily.     dicyclomine (BENTYL) 10 MG capsule Take 1 capsule (10 mg total) by mouth 4 (four) times daily -  before meals and at bedtime. (Patient not taking: Reported on 08/22/2022) 45 capsule 0   No current facility-administered medications for this visit.   Review of Systems: All systems reviewed and negative except where noted in HPI.   Physical Exam: BP (!) 142/76   Pulse 81   Ht 5\' 4"  (1.626 m)   Wt 135 lb (61.2 kg)   BMI 23.17 kg/m  Constitutional: Pleasant,well-developed,female in no acute distress. HEENT: Normocephalic and atraumatic. Conjunctivae are  normal. No scleral icterus. Cardiovascular: Normal rate, regular rhythm.  Pulmonary/chest: Effort normal and breath sounds normal. No wheezing, rales or rhonchi. Abdominal: Soft, nondistended, nontender. Bowel sounds active throughout. There are no masses palpable. No hepatomegaly. Extremities: No edema Neurological: Alert and oriented to person place and time. Skin: Skin is warm and dry. No rashes noted. Psychiatric: Normal mood and affect. Behavior is normal.  Labs 08/2021: H pylori breath test negative  Labs 09/2021: CMP with mildly elevated T bili of 1.5. Lipase normal.  CTA C/A/P 10/18/21: IMPRESSION: 1. No bowel wall thickening or arterial occlusion/stenosis to suggest mesenteric ischemia. 2.  Aortic Atherosclerosis (ICD10-I70.0). 3. Diverticulosis with no diverticulitis.  UGI Series 10/28/21: IMPRESSION: 1. Mild-to-moderate gastroesophageal reflux elicited. No hiatal hernia. 2. Moderate esophageal dysmotility with a presbyesophagus pattern. 3. Evidence of mild reflux esophagitis. No evidence of esophageal mass or stricture. 4. Normal stomach and duodenum.  ASSESSMENT AND PLAN: Bloating Abdominal discomfort Occasional diarrhea Fecal incontinence Patient continues to have some bloating and abdominal discomfort. She has tried to follow the low FODMAP diet, but this has been somewhat challenging. She has tried Gas-X, which has helped. Based upon the patient's symptoms, will plan to treat empirically for SIBO to see if the patient responds. If the rifaximin is not effective, then could consider KUB in the future to assess for underlying  stool burden in order to determine if the patient is having any overflow diarrhea/incontinence.  - Low FODMAP diet - Drink 8 cups of water per day - Cont simethicone PRN - Trial of rifaximin 550 mg TID for presumed SIBO - RTC 4 weeks  Christia Reading, MD  I spent 36 minutes of time, including in depth chart review, independent review of results  as outlined above, communicating results with the patient directly, face-to-face time with the patient, coordinating care, and ordering studies and medications as appropriate, and documentation.

## 2022-09-05 ENCOUNTER — Ambulatory Visit: Payer: Medicare Other | Admitting: Nurse Practitioner

## 2022-09-08 ENCOUNTER — Telehealth: Payer: Self-pay | Admitting: Internal Medicine

## 2022-09-08 NOTE — Telephone Encounter (Signed)
The pt states that she has some burning with urination.  She is not sure if medication has caused this. I have advised her to call her PCP to discuss.  The pt has been advised of the information and verbalized understanding.

## 2022-09-08 NOTE — Telephone Encounter (Signed)
Inbound call from patient stating that she was put on a medication to try and had just finished the medication on Friday. Patient stated since Friday she has been having issues with burning when she using the bathroom. Patient is requesting a call back to discuss. Please advise.

## 2022-09-09 ENCOUNTER — Ambulatory Visit (INDEPENDENT_AMBULATORY_CARE_PROVIDER_SITE_OTHER): Payer: Medicare Other | Admitting: Family Medicine

## 2022-09-09 VITALS — BP 138/70 | HR 75 | Temp 96.8°F | Ht 64.0 in | Wt 138.0 lb

## 2022-09-09 DIAGNOSIS — N958 Other specified menopausal and perimenopausal disorders: Secondary | ICD-10-CM | POA: Diagnosis not present

## 2022-09-09 LAB — POCT URINALYSIS DIPSTICK
Bilirubin, UA: NEGATIVE
Blood, UA: NEGATIVE
Glucose, UA: NEGATIVE
Ketones, UA: NEGATIVE
Leukocytes, UA: NEGATIVE
Nitrite, UA: NEGATIVE
Protein, UA: NEGATIVE
Spec Grav, UA: 1.015 (ref 1.010–1.025)
Urobilinogen, UA: NEGATIVE E.U./dL — AB
pH, UA: 6 (ref 5.0–8.0)

## 2022-09-09 MED ORDER — ESTRADIOL 0.1 MG/GM VA CREA: TOPICAL_CREAM | VAGINAL | 12 refills | Status: DC

## 2022-09-09 NOTE — Patient Instructions (Signed)
Atrophic Vaginitis (Genitourinary Syndrome of Menopause)  Atrophic vaginitis is a condition in which the tissues that line the vagina become dry and thin. This condition is most common in women who have stopped having regular menstrual periods (are in menopause). This usually starts when a woman is 54 to 86 years old. That is the time when a woman's estrogen levels begin to decrease. Estrogen is a female hormone. It helps to keep the tissues of the vagina moist. It stimulates the vagina to produce a clear fluid that lubricates the vagina for sex. This fluid also protects the vagina from infection. Lack of estrogen can cause the lining of the vagina to get thinner and dryer. The vagina may also shrink in size. It may become less elastic. Atrophic vaginitis tends to get worse over time as a woman's estrogen level drops. What are the causes? This condition is caused by the normal drop in estrogen that happens around the time of menopause. What increases the risk? Certain conditions or situations may lower a woman's estrogen level, leading to a higher risk for atrophic vaginitis. You are more likely to develop this condition if: You are taking medicines that block estrogen. You have had your ovaries removed. You are being treated for cancer with radiation or medicines (chemotherapy). You have given birth or are breastfeeding. You are older than age 66. You smoke. What are the signs or symptoms? Symptoms of this condition include: Pain, soreness, a feeling of pressure, or bleeding during sex (dyspareunia). Vaginal burning, irritation, or itching. Pain or bleeding when a speculum is used in a vaginal exam. Having burning pain while urinating. Vaginal discharge. In some cases, there are no symptoms. How is this diagnosed? This condition is diagnosed based on your medical history and a physical exam. This will include a pelvic exam that checks the vaginal tissues. Though rare, you may also have other  tests, including: A urine test. A test that checks the acid balance in your vagina (acid balance test). How is this treated? Treatment for this condition depends on how severe your symptoms are. Treatment may include: Using an over-the-counter vaginal lubricant before sex. Using a long-acting vaginal moisturizer. Using low-dose estrogen for moderate to severe symptoms that do not respond to other treatments. Options include creams, tablets, and inserts (vaginal rings). Before you use a vaginal estrogen, tell your health care provider if you have a history of: Breast cancer. Endometrial cancer. Blood clots. If you are not sexually active and your symptoms are very mild, you may not need treatment. Follow these instructions at home: Medicines Take over-the-counter and prescription medicines only as told by your health care provider. Do not use herbal or alternative medicines unless your health care provider says that you can. Use over-the-counter creams, lubricants, or moisturizers for dryness only as told by your health care provider. General instructions If your atrophic vaginitis is caused by menopause, discuss all of your menopause symptoms and treatment options with your health care provider. Do not douche. Do not use products that can make your vagina dry. These include: Scented feminine sprays. Scented tampons. Scented soaps. Vaginal sex can help to improve blood flow and elasticity of vaginal tissue. If you choose to have sex and it hurts, try using a water-soluble lubricant or moisturizer right before having sex. Contact a health care provider if: Your discharge looks different than normal. Your vagina has an unusual smell. You have new symptoms. Your symptoms do not improve with treatment. Your symptoms get worse. Summary Atrophic  vaginitis is a condition in which the tissues that line the vagina become dry and thin. It is most common in women who have stopped having regular  menstrual periods (are in menopause). Treatment options include using vaginal lubricants and low-dose vaginal estrogen. Contact a health care provider if your vagina has an unusual smell, or if your symptoms get worse or do not improve after treatment. This information is not intended to replace advice given to you by your health care provider. Make sure you discuss any questions you have with your health care provider. Document Revised: 04/05/2020 Document Reviewed: 04/05/2020 Elsevier Patient Education  2023 [employer].

## 2022-09-09 NOTE — Progress Notes (Signed)
[facility] [address] [place] [address] Dept: [phone] Dept Fax: [phone]  Office Visit  Subjective:    Patient ID: Nancy Neal, female    DOB: 1932-01-01, 88 y.o..   MRN: [id number]  Chief Complaint  Patient presents with   Acute Visit    C/o having burning with urination x 4-5 days.      History of Present Illness:  Patient is in today complaining of a 4-5 day history of dysuria. She notes that when this began over the weekend, she started drinking tea more frequently, to try and increase her urine volume. She did also taking some Azo, which provided some temporary relief. Nancy Neal denies any vaginal discharge and is not having any vaginal itching. She notes that she underwent surgical menopause at age 67, when she had a hysterectomy. Her chart indicates a past history of atrophic vaginitis. She states many years ago, she was on an estrogen pill, but felt uncomfortable staying on this.  Past Medical History: Patient Active Problem List   Diagnosis Date Noted   Abdominal pain 08/05/2022   IBS (irritable bowel syndrome) 08/05/2022   Hearing loss in left ear 10/07/2021   Osteoporosis 10/07/2021   Urge and stress incontinence 07/04/2021   History of diverticulitis of colon    CAD S/P PCI 12/15/15 12/17/2015   Coronary artery disease involving native coronary artery of native heart with angina pectoris (HCC) 12/17/2015   Hyperlipidemia 12/15/2015   Nuclear sclerosis of both eyes 07/03/2014   Primary open angle glaucoma of both eyes, indeterminate stage 07/03/2014   VERTIGO 02/16/2009   VAGINITIS, ATROPHIC, POSTMENOPAUSAL 12/06/2008   Allergic rhinitis 06/02/2007   Past Surgical History:  Procedure Laterality Date   APPENDECTOMY  1971   CARDIAC CATHETERIZATION N/A 12/15/2015   Procedure: Left Heart Cath and Coronary Angiography;  Surgeon: Lyn Records, MD;  Location: [facility] INVASIVE CV LAB;  Service:  Cardiovascular;  Laterality: N/A;   CARDIAC CATHETERIZATION N/A 12/15/2015   Procedure: Coronary Stent Intervention;  Surgeon: Lyn Records, MD;  Location: [facility] INVASIVE CV LAB;  Service: Cardiovascular;  Laterality: N/A;   COLONOSCOPY N/A 12/20/2015   Procedure: COLONOSCOPY;  Surgeon: Hilarie Fredrickson, MD;  Location: [facility] ENDOSCOPY;  Service: Endoscopy;  Laterality: N/A;   EYE SURGERY Bilateral    "laser tx for glaucoma"   HEMORRHOID SURGERY  ~ 1968   SHOULDER ARTHROSCOPY W/ ROTATOR CUFF REPAIR Right 11/2014   TOTAL ABDOMINAL HYSTERECTOMY  1971   Family History  Problem Relation Age of Onset   Suicidality Mother    Heart disease Father    Arthritis Brother    Breast cancer Neg Hx    Colon cancer Neg Hx    Stomach cancer Neg Hx    Esophageal cancer Neg Hx    Outpatient Medications Prior to Visit  Medication Sig Dispense Refill   acetaminophen (TYLENOL) 325 MG tablet Take 2 tablets (650 mg total) by mouth every 4 (four) hours as needed for headache or mild pain.     Ascorbic Acid (VITAMIN C ER PO) Take 1 tablet by mouth daily.     aspirin 81 MG chewable tablet Chew 1 tablet (81 mg total) by mouth daily.     atorvastatin (LIPITOR) 10 MG tablet TAKE 1 TABLET(10 MG) BY MOUTH DAILY Strength: 10 mg 90 tablet 2   carboxymethylcellulose (REFRESH PLUS) 0.5 % SOLN Place 1 drop into both eyes 3 (three) times daily as needed (dry eyes).  MAGNESIUM PO Take 1 tablet by mouth daily.     Multiple Vitamin (MULTIVITAMIN WITH MINERALS) TABS tablet Take 1 tablet by mouth daily.     vitamin B-12 (CYANOCOBALAMIN) 100 MCG tablet Take 100 mcg by mouth daily.     vitamin E 1000 UNIT capsule Take 1,000 Units by mouth daily.     dicyclomine (BENTYL) 10 MG capsule Take 1 capsule (10 mg total) by mouth 4 (four) times daily -  before meals and at bedtime. (Patient not taking: Reported on 09/09/2022) 45 capsule 0   No facility-administered medications prior to visit.   Allergies  Allergen Reactions   Codeine Sulfate  Nausea And Vomiting   Meclizine Other (See Comments)    Other reaction(s): Other (See Comments) glaucoma glaucoma   Acetaminophen Nausea Only    Pain Medicines make her sick   Morphine    Other     "All painkillers" cause nausea   Tramadol      Objective:   Today's Vitals   09/09/22 0829  BP: 138/70  Pulse: 75  Temp: (!) 96.8 F (36 C)  TempSrc: Temporal  SpO2: 95%  Weight: 138 lb (62.6 kg)  Height: 5\' 4"  (1.626 m)   Body mass index is 23.69 kg/m.   General: Well developed, well nourished. No acute distress. Psych: Alert and oriented. Normal mood and affect.  Health Maintenance Due  Topic Date Due   Medicare Annual Wellness (AWV)  10/07/2022   Lab Results Component Ref Range & Units 08:35 (09/09/22)  Color, UA  yellow  Clarity, UA  clear  Glucose, UA Negative Negative  Bilirubin, UA  neg  Ketones, UA  neg  Spec Grav, UA 1.010 - 1.025 1.015  Blood, UA  neg  pH, UA 5.0 - 8.0 6.0  Protein, UA Negative Negative  Urobilinogen, UA 0.2 or 1.0 E.U./dL negative Abnormal   Nitrite, UA  neg  Leukocytes, UA Negative Negative   Assessment & Plan:   1. Genitourinary syndrome of menopause The urine dipstick is not consistent with a urinary tract infection. I suspect that her underlying issues is GUSM. I discussed options for management of this. I recommend she consider using a vaginal estrogen cream. She has some hesitancies about this and wants to be able to read more about this approach to treatment. I will provide her some patient information on atrophic vaginitis (GUSM) and prescribed an Estrace cream. She will decide if she will pick up the medicine. I encouraged her to discuss this further with Dr. 09/11/22, her PCP.  - POCT Urinalysis Dipstick - estradiol (ESTRACE) 0.1 MG/GM vaginal cream; Place 1 Applicatorful vaginally at bedtime for 14 days, THEN 1 Applicatorful 3 (three) times a week  Dispense: 42.5 g; Refill: 12  Return if symptoms worsen or fail to improve.    Okey Dupre, MD

## 2022-09-26 ENCOUNTER — Telehealth: Payer: Self-pay | Admitting: Interventional Cardiology

## 2022-09-26 NOTE — Telephone Encounter (Signed)
Pt is returning call. Requesting return call.  

## 2022-09-26 NOTE — Telephone Encounter (Signed)
Pt is requesting call back to discuss Dr. Katrinka Blazing retiring letter she received. She would like advise as to her next steps.  Went over some of the other doctors available with patient and when she is due for her next appt, and she would still like to speak to a nurse in regards to the next steps. Please advise.

## 2022-09-26 NOTE — Telephone Encounter (Signed)
Left message for patient to call back  

## 2022-09-26 NOTE — Telephone Encounter (Signed)
Left message to call back  

## 2022-09-30 ENCOUNTER — Ambulatory Visit (INDEPENDENT_AMBULATORY_CARE_PROVIDER_SITE_OTHER): Payer: Medicare Other | Admitting: Internal Medicine

## 2022-09-30 ENCOUNTER — Encounter: Payer: Self-pay | Admitting: Internal Medicine

## 2022-09-30 DIAGNOSIS — R159 Full incontinence of feces: Secondary | ICD-10-CM

## 2022-09-30 DIAGNOSIS — R140 Abdominal distension (gaseous): Secondary | ICD-10-CM | POA: Diagnosis not present

## 2022-09-30 DIAGNOSIS — R197 Diarrhea, unspecified: Secondary | ICD-10-CM | POA: Diagnosis not present

## 2022-09-30 DIAGNOSIS — R109 Unspecified abdominal pain: Secondary | ICD-10-CM | POA: Diagnosis not present

## 2022-09-30 NOTE — Progress Notes (Signed)
Chief Complaint: Abdominal pain  HPI : 88 year old female with history of GERD, CAD, glaucoma presents for follow up of abdominal pain.  Interval History: She used to have pain in the lower abdomen, but this has gone away. She does still have some difficulties with diarrhea and fecal incontinence. When she was on the rifaximin, her GI symptoms resolved. However, when she stopped her rifaximin, some of her GI symptoms returned. For her frequent stools, she uses one Imodium per day. She has been following the low FODMAP diet diligently but has still had diarrhea. She had rectal bleeding when she wiped with the toilet paper one time since her last clinic visit.  Current Outpatient Medications  Medication Sig Dispense Refill   acetaminophen (TYLENOL) 325 MG tablet Take 2 tablets (650 mg total) by mouth every 4 (four) hours as needed for headache or mild pain.     Ascorbic Acid (VITAMIN C ER PO) Take 1 tablet by mouth daily.     aspirin 81 MG chewable tablet Chew 1 tablet (81 mg total) by mouth daily.     atorvastatin (LIPITOR) 10 MG tablet TAKE 1 TABLET(10 MG) BY MOUTH DAILY Strength: 10 mg 90 tablet 2   carboxymethylcellulose (REFRESH PLUS) 0.5 % SOLN Place 1 drop into both eyes 3 (three) times daily as needed (dry eyes).      MAGNESIUM PO Take 1 tablet by mouth daily.     Multiple Vitamin (MULTIVITAMIN WITH MINERALS) TABS tablet Take 1 tablet by mouth daily.     vitamin B-12 (CYANOCOBALAMIN) 100 MCG tablet Take 100 mcg by mouth daily.     vitamin E 1000 UNIT capsule Take 1,000 Units by mouth daily.     dicyclomine (BENTYL) 10 MG capsule Take 1 capsule (10 mg total) by mouth 4 (four) times daily -  before meals and at bedtime. (Patient not taking: Reported on 09/09/2022) 45 capsule 0   estradiol (ESTRACE) 0.1 MG/GM vaginal cream Place 1 Applicatorful vaginally at bedtime for 14 days, THEN 1 Applicatorful 3 (three) times a week (Patient not taking: Reported on 09/30/2022) 42.5 g 12   No current  facility-administered medications for this visit.   Review of Systems: All systems reviewed and negative except where noted in HPI.   Physical Exam: BP (!) 140/80   Pulse 78   Ht 5\' 6"  (1.676 m)   Wt 132 lb (59.9 kg)   SpO2 98%   BMI 21.31 kg/m  Constitutional: Pleasant,well-developed,female in no acute distress. HEENT: Normocephalic and atraumatic. Conjunctivae are normal. No scleral icterus. Cardiovascular: Normal rate, regular rhythm.  Pulmonary/chest: Effort normal and breath sounds normal. No wheezing, rales or rhonchi. Abdominal: Soft, nondistended, nontender. Bowel sounds active throughout. There are no masses palpable. No hepatomegaly. Extremities: No edema Neurological: Alert and oriented to person place and time. Skin: Skin is warm and dry. No rashes noted. Psychiatric: Normal mood and affect. Behavior is normal.  Labs 08/2021: H pylori breath test negative  Labs 09/2021: CMP with mildly elevated T bili of 1.5. Lipase normal.  CTA C/A/P 10/18/21: IMPRESSION: 1. No bowel wall thickening or arterial occlusion/stenosis to suggest mesenteric ischemia. 2.  Aortic Atherosclerosis (ICD10-I70.0). 3. Diverticulosis with no diverticulitis.  UGI Series 10/28/21: IMPRESSION: 1. Mild-to-moderate gastroesophageal reflux elicited. No hiatal hernia. 2. Moderate esophageal dysmotility with a presbyesophagus pattern. 3. Evidence of mild reflux esophagitis. No evidence of esophageal mass or stricture. 4. Normal stomach and duodenum.  Colonoscopy 12/20/15:   ASSESSMENT AND PLAN: Bloating Abdominal discomfort Diarrhea Fecal incontinence Patient  did have some response to rifaximin therapy, but then had a recurrence in her symptoms after she completed her antibiotics. Patient's diarrhea may be due to microscopic colitis so will plan for a flex sig with random colon biopsies at that time.  - Previously gave information on low FODMAP diet - Cont simethicone PRN - Flexible  sigmoidoscopy [facility]. Will try without sedation but she would like the option of doing it with sedation if needed - Consider pelvic floor PT or Bentyl in the future  Eulah Pont, MD  I spent 40 minutes of time, including in depth chart review, independent review of results as outlined above, communicating results with the patient directly, face-to-face time with the patient, coordinating care, ordering studies and medications as appropriate, and documentation.

## 2022-09-30 NOTE — Patient Instructions (Signed)
If you are age 86 or older, your body mass index should be between 23-30. Your Body mass index is 21.31 kg/m. If this is out of the aforementioned range listed, please consider follow up with your Primary Care Provider. ________________________________________________________  The [place] [facility] providers would like to encourage you to use [facility] to communicate with providers for non-urgent requests or questions.  Due to long hold times on the telephone, sending your provider a message by [facility] may be a faster and more efficient way to get a response.  Please allow 48 business hours for a response.  Please remember that this is for non-urgent requests.  _______________________________________________________  Bonita Quin have been scheduled for a flexible sigmoidoscopy. Please follow the written instructions given to you at your visit today. If you use inhalers (even only as needed), please bring them with you on the day of your procedure.  Due to recent changes in healthcare laws, you may see the results of your imaging and laboratory studies on MyChart before your provider has had a chance to review them.  We understand that in some cases there may be results that are confusing or concerning to you. Not all laboratory results come back in the same time frame and the provider may be waiting for multiple results in order to interpret others.  Please give [name] 48 hours in order for your provider to thoroughly review all the results before contacting the office for clarification of your results.   Thank you for entrusting me with your care and choosing [facility].  Dr Leonides Schanz

## 2022-09-30 NOTE — Telephone Encounter (Signed)
Spoke with patient and discussed the 3 providers Dr. Katrinka Blazing recommends to follow care after he retires: Donato Schultz, MD or Riley Lam, MD or Laurance Flatten, MD  Patient requested to make appt with Dr. Anne Fu, appt scheduled for 01/29/2023 at 10:00AM.  Patient expressed appreciation for call.

## 2022-10-09 ENCOUNTER — Other Ambulatory Visit: Payer: Self-pay | Admitting: Internal Medicine

## 2022-10-09 DIAGNOSIS — Z1231 Encounter for screening mammogram for malignant neoplasm of breast: Secondary | ICD-10-CM

## 2022-10-27 ENCOUNTER — Encounter: Payer: Federal, State, Local not specified - PPO | Admitting: Nurse Practitioner

## 2022-10-29 ENCOUNTER — Telehealth: Payer: Self-pay | Admitting: Internal Medicine

## 2022-10-29 NOTE — Telephone Encounter (Signed)
Inbound call from patient stating that she is scheduled to have a flex sig on 2/8 at 8:30 and was orginally wanting to have sedation but has changed her mind and does not want to do that. Patient also is requesting a call back to discuss if she can take Atorvastatin 10 mg before the procedure or if she needs to stop taking it. Please advise.

## 2022-10-30 ENCOUNTER — Encounter: Payer: Self-pay | Admitting: Internal Medicine

## 2022-10-30 ENCOUNTER — Ambulatory Visit (INDEPENDENT_AMBULATORY_CARE_PROVIDER_SITE_OTHER): Payer: Medicare Other | Admitting: Internal Medicine

## 2022-10-30 VITALS — BP 160/84 | HR 75 | Temp 97.6°F | Ht 66.0 in | Wt 134.0 lb

## 2022-10-30 DIAGNOSIS — R1030 Lower abdominal pain, unspecified: Secondary | ICD-10-CM | POA: Diagnosis not present

## 2022-10-30 DIAGNOSIS — H905 Unspecified sensorineural hearing loss: Secondary | ICD-10-CM | POA: Diagnosis not present

## 2022-10-30 DIAGNOSIS — Z0001 Encounter for general adult medical examination with abnormal findings: Secondary | ICD-10-CM | POA: Diagnosis not present

## 2022-10-30 DIAGNOSIS — I25119 Atherosclerotic heart disease of native coronary artery with unspecified angina pectoris: Secondary | ICD-10-CM | POA: Diagnosis not present

## 2022-10-30 DIAGNOSIS — N3946 Mixed incontinence: Secondary | ICD-10-CM

## 2022-10-30 DIAGNOSIS — E782 Mixed hyperlipidemia: Secondary | ICD-10-CM

## 2022-10-30 DIAGNOSIS — M810 Age-related osteoporosis without current pathological fracture: Secondary | ICD-10-CM | POA: Diagnosis not present

## 2022-10-30 LAB — VITAMIN D 25 HYDROXY (VIT D DEFICIENCY, FRACTURES): VITD: 76.84 ng/mL (ref 30.00–100.00)

## 2022-10-30 LAB — LIPID PANEL
Cholesterol: 141 mg/dL (ref 0–200)
HDL: 66.3 mg/dL (ref >39.00)
LDL Cholesterol: 62 mg/dL (ref 0–99)
NonHDL: 74.59
Total CHOL/HDL Ratio: 2
Triglycerides: 65 mg/dL (ref 0.0–149.0)
VLDL: 13 mg/dL (ref 0.0–40.0)

## 2022-10-30 LAB — COMPREHENSIVE METABOLIC PANEL
ALT: 14 U/L (ref 0–35)
AST: 19 U/L (ref 0–37)
Albumin: 4.3 g/dL (ref 3.5–5.2)
Alkaline Phosphatase: 77 U/L (ref 39–117)
BUN: 24 mg/dL — ABNORMAL HIGH (ref 6–23)
CO2: 31 mEq/L (ref 19–32)
Calcium: 9.6 mg/dL (ref 8.4–10.5)
Chloride: 106 mEq/L (ref 96–112)
Creatinine, Ser: 0.97 mg/dL (ref 0.40–1.20)
GFR: 52.27 mL/min — ABNORMAL LOW (ref >60.00)
Glucose, Bld: 91 mg/dL (ref 70–99)
Potassium: 5.2 mEq/L — ABNORMAL HIGH (ref 3.5–5.1)
Sodium: 141 mEq/L (ref 135–145)
Total Bilirubin: 1.3 mg/dL — ABNORMAL HIGH (ref 0.2–1.2)
Total Protein: 6.6 g/dL (ref 6.0–8.3)

## 2022-10-30 LAB — CBC
HCT: 44.7 % (ref 36.0–46.0)
Hemoglobin: 14.9 g/dL (ref 12.0–15.0)
MCHC: 33.3 g/dL (ref 30.0–36.0)
MCV: 95.4 fl (ref 78.0–100.0)
Platelets: 235 10*3/uL (ref 150.0–400.0)
RBC: 4.68 Mil/uL (ref 3.87–5.11)
RDW: 14.3 % (ref 11.5–15.5)
WBC: 4.9 10*3/uL (ref 4.0–10.5)

## 2022-10-30 LAB — VITAMIN B12: Vitamin B-12: 480 pg/mL (ref 211–911)

## 2022-10-30 NOTE — Patient Instructions (Signed)
We will check the labs today. 

## 2022-10-30 NOTE — Assessment & Plan Note (Addendum)
Overall stable. She is not on medication at this time.

## 2022-10-30 NOTE — Progress Notes (Signed)
Subjective:   Patient ID: Nancy Neal, female    DOB: Jul 13, 1932, 89 y.o.   MRN: [id number]  HPI Here for medicare wellness, no new complaints. Please see A/P for status and treatment of chronic medical problems.   Diet: heart healthy Physical activity: sedentary, does exercises weekly Depression/mood screen: negative Hearing: intact to whispered voice, loss left ear, right ear with mild loss Visual acuity: grossly normal, performs annual eye exam  ADLs: capable Fall risk: none Home safety: good Cognitive evaluation: intact to orientation, naming, recall and repetition EOL planning: adv directives discussed  [place] Visit from 08/05/2022 in [place] at [place] Office Visit from 08/05/2022 in [place] at [facility]  PHQ-9 Total Score 0         10/07/2021   10:29 AM 10/18/2021    9:59 AM 03/14/2022   11:19 AM 06/17/2022    4:53 AM 08/05/2022    8:08 AM  [place] in the past year? 0  0  0  Was there an injury with Fall? 0  0  0  Fall Risk Category Calculator 0  0  0  Fall Risk Category Low  Low  Low  Patient Fall Risk Level Low fall risk Low fall risk  Low fall risk   Patient at Risk for Falls Due to No Fall Risks    No Fall Risks  Fall risk Follow up Falls evaluation completed    Falls evaluation completed    I have personally reviewed and have noted 1. The patient's medical and social history - reviewed today no changes 2. Their use of alcohol, tobacco or illicit drugs 3. Their current medications and supplements 4. The patient's functional ability including ADL's, fall risks, home safety risks and hearing or visual impairment. 5. Diet and physical activities 6. Evidence for depression or mood disorders 7. Care team reviewed and updated 8.  The patient is not on an opioid pain medication.  Patient Care Team: Hoyt Koch, MD as PCP - General (Internal  Medicine) Belva Crome, MD as PCP - Cardiology (Cardiology) Drake Leach, [place] (Optometry) Past Medical History:  Diagnosis Date   ALLERGIC RHINITIS 06/02/2007   Arthritis    "maybe in my right knee" (12/19/2015)   Coronary artery disease 11/2015   a. 11/2015: Inferior STEMI w/ Promus DES to RCA.     Diverticulosis of colon 2003   by 2003 and 2008 colonoscopy.    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   History of blood transfusion 1971   "when I had hysterectomy"   Myocardial infarction ([place]) 11/2015   OSTEOPENIA 06/02/2007   VAGINITIS, ATROPHIC, POSTMENOPAUSAL 12/06/2008   VERTIGO 02/16/2009   Past Surgical History:  Procedure Laterality Date   APPENDECTOMY  1971   CARDIAC CATHETERIZATION N/A 12/15/2015   Procedure: Left Heart Cath and Coronary Angiography;  Surgeon: Belva Crome, MD;  Location: [place] CV LAB;  Service: Cardiovascular;  Laterality: N/A;   CARDIAC CATHETERIZATION N/A 12/15/2015   Procedure: Coronary Stent Intervention;  Surgeon: Belva Crome, MD;  Location: [place] CV LAB;  Service: Cardiovascular;  Laterality: N/A;   COLONOSCOPY N/A 12/20/2015   Procedure: COLONOSCOPY;  Surgeon: Irene Shipper, MD;  Location: [place];  Service: Endoscopy;  Laterality: N/A;   EYE SURGERY Bilateral    "laser tx for glaucoma"   HEMORRHOID  SURGERY  ~ 1968   SHOULDER ARTHROSCOPY W/ ROTATOR CUFF REPAIR Right 11/2014   TOTAL ABDOMINAL HYSTERECTOMY  1971   Family History  Problem Relation Age of Onset   Suicidality Mother    Heart disease Father    Arthritis Brother    Breast cancer Neg Hx    Colon cancer Neg Hx    Stomach cancer Neg Hx    Esophageal cancer Neg Hx    Review of Systems  Constitutional: Negative.   HENT: Negative.    Eyes: Negative.   Respiratory:  Negative for cough, chest tightness and shortness of breath.   Cardiovascular:  Negative for chest pain, palpitations and leg swelling.  Gastrointestinal:  Negative for abdominal  distention, abdominal pain, constipation, diarrhea, nausea and vomiting.  Musculoskeletal: Negative.   Skin: Negative.   Neurological: Negative.   Psychiatric/Behavioral: Negative.      Objective:  Physical Exam Constitutional:      Appearance: She is well-developed.  HENT:     Head: Normocephalic and atraumatic.  Cardiovascular:     Rate and Rhythm: Normal rate and regular rhythm.  Pulmonary:     Effort: Pulmonary effort is normal. No respiratory distress.     Breath sounds: Normal breath sounds. No wheezing or rales.  Abdominal:     General: Bowel sounds are normal. There is no distension.     Palpations: Abdomen is soft.     Tenderness: There is abdominal tenderness. There is no rebound.  Musculoskeletal:     Cervical back: Normal range of motion.  Skin:    General: Skin is warm and dry.  Neurological:     Mental Status: She is alert and oriented to person, place, and time.     Coordination: Coordination normal.    Vitals:   10/30/22 0918 10/30/22 0923  BP: (!) 160/84 (!) 160/84  Pulse: 75   Temp: 97.6 F (36.4 C)   TempSrc: Oral   SpO2: 95%   Weight: 134 lb (60.8 kg)   Height: 5\' 6"  (1.676 m)     Assessment & Plan:

## 2022-10-30 NOTE — Telephone Encounter (Signed)
Spoke with the patient. She does not have a care partner for her procedure. She is therefore asking not to be sedated.  Patient asks about Atorvastatin because the patient thought this medication was an anticoagulant. I have told the patient she may continue her atorvastatin as directed.  The patient reviews her prep instructions and tells me she lives 30 minutes away from [place]. She will have to take her last enema and hour and a half before instead of an hour. Otherwise she may have an "accident" on the way here.

## 2022-10-30 NOTE — Assessment & Plan Note (Signed)
Checking lipid panel and adjust lipitor 10 mg daily as needed. LDL goal <70.

## 2022-10-30 NOTE — Assessment & Plan Note (Signed)
Working with GI to help this and getting sigmoidoscopy in the next month or so.

## 2022-10-30 NOTE — Assessment & Plan Note (Signed)
BP is elevated today and this is unusual for her. She is not on medication for BP. Asked her to monitor at home and if persistently high we will add medication. She is taking aspirin and statin to prevent CAD. No chest pain symptoms.

## 2022-10-30 NOTE — Assessment & Plan Note (Signed)
Stable from prior surgery as child and progressive through lifetime.

## 2022-10-30 NOTE — Assessment & Plan Note (Signed)
Counseled about new diagnosis. She does not want treatment at this time. We will plan DEXA 2025. Checking vitamin d and calcium levels today. Counseled about exercise 3 times a week at least.

## 2022-10-31 NOTE — Assessment & Plan Note (Signed)
Flu shot up to date. Covid-19 counseled. Pneumonia complete. Shingrix complete. Tetanus due 2029. Colonoscopy aged out. Mammogram aged out, pap smear aged out and dexa due 2025. Counseled about sun safety and mole surveillance. Counseled about the dangers of distracted driving. Given 10 year screening recommendations.

## 2022-11-21 ENCOUNTER — Telehealth: Payer: Self-pay | Admitting: Internal Medicine

## 2022-11-21 NOTE — Telephone Encounter (Signed)
Inbound call from patient requesting a call from the nurse to discuss her upcoming procedure on 2/8 at 8:30 for a Flex Sig. Please advise.

## 2022-11-24 NOTE — Telephone Encounter (Signed)
Patient is calling for advise on giving herself an enema. She is widowed. She does not have a care partner. She is scheduled for a flexible sigmoidoscopy without sedation on  11/27/22. Patient states she is able to get up from the floor without difficulty. I explained to her place a towel on the bathroom floor. Lie on her side on the towel, and pull her knees under her abdomen and chest. Gently insert the lubricated tube up to 4 inches into her rectum. Once the tube is secure, gently squeeze the enema bottle. Lie still for at least 5 minutes before expelling the contents into the toilet. Patient thanks me for my call.

## 2022-11-27 ENCOUNTER — Ambulatory Visit (AMBULATORY_SURGERY_CENTER): Payer: Medicare Other | Admitting: Internal Medicine

## 2022-11-27 ENCOUNTER — Encounter: Payer: Self-pay | Admitting: Internal Medicine

## 2022-11-27 VITALS — BP 117/75 | HR 70 | Temp 96.4°F | Resp 12 | Ht 66.0 in | Wt 132.0 lb

## 2022-11-27 DIAGNOSIS — R197 Diarrhea, unspecified: Secondary | ICD-10-CM | POA: Diagnosis not present

## 2022-11-27 DIAGNOSIS — R140 Abdominal distension (gaseous): Secondary | ICD-10-CM

## 2022-11-27 MED ORDER — SODIUM CHLORIDE 0.9 % IV SOLN: 500.0000 mL | Freq: Once | INTRAVENOUS | Status: DC

## 2022-11-27 NOTE — Progress Notes (Signed)
Report to pacu rn. Vss. Care resumed by rn. 

## 2022-11-27 NOTE — Op Note (Signed)
[facility] Patient Name: Nancy Neal Procedure Date: 11/27/2022 8:37 AM MRN: [id number] Endoscopist: Adline Mango [place] , , [id number] Age: 89 Referring MD:  Date of Birth: Aug 15, 1932 Gender: Female Account #: [id number] Procedure:                Flexible Sigmoidoscopy Indications:              Diarrhea Medicines:                None Procedure:                Pre-Anesthesia Assessment:                           - Prior to the procedure, a History and Physical                            was performed, and patient medications and                            allergies were reviewed. The patient's tolerance of                            previous anesthesia was also reviewed. The risks                            and benefits of the procedure and the sedation                            options and risks were discussed with the patient.                            All questions were answered, and informed consent                            was obtained. Prior Anticoagulants: The patient has                            taken no anticoagulant or antiplatelet agents. ASA                            Grade Assessment: III - A patient with severe                            systemic disease. After reviewing the risks and                            benefits, the patient was deemed in satisfactory                            condition to undergo the procedure.                           After obtaining informed consent, the scope was  passed under direct vision. The GIF [id number] #[id number]                            was introduced through the anus and advanced to the                            the left transverse colon. The flexible                            sigmoidoscopy was accomplished without difficulty.                            The patient tolerated the procedure well. Scope In: 8:53:48 AM Scope Out: 9:04:26 AM Total Procedure Duration: 0 hours 10 minutes 38  seconds  Findings:                 Multiple diverticula were found in the sigmoid                            colon and descending colon.                           Biopsies for histology were taken with a cold                            forceps from the left transverse colon, descending                            colon, sigmoid colon and rectum for evaluation of                            microscopic colitis.                           Non-bleeding internal hemorrhoids were found during                            retroflexion. Complications:            No immediate complications. Estimated Blood Loss:     Estimated blood loss was minimal. Impression:               - Diverticulosis in the sigmoid colon and in the                            descending colon.                           - Non-bleeding internal hemorrhoids.                           - Biopsies were taken with a cold forceps from the                            left transverse colon, descending colon, sigmoid  colon and rectum for evaluation of microscopic                            colitis. Recommendation:           - Discharge patient to home (with escort).                           - Await pathology results.                           - The findings and recommendations were discussed                            with the patient. Dr Georgian Co "Lyndee Leo" [place],  11/27/2022 9:08:16 AM

## 2022-11-27 NOTE — Progress Notes (Signed)
GASTROENTEROLOGY PROCEDURE H&P NOTE   Primary Care Physician: Hoyt Koch, MD    Reason for Procedure:   Diarrhea  Plan:    Flexible sigmoidoscopy  Patient is appropriate for endoscopic procedure(s) in the ambulatory ([facility]) setting.  The nature of the procedure, as well as the risks, benefits, and alternatives were carefully and thoroughly reviewed with the patient. Ample time for discussion and questions allowed. The patient understood, was satisfied, and agreed to proceed.     HPI: Nancy Neal is a 89 y.o. female who presents for flexible sigmoidoscopy for evaluation of diarrhea .  Patient was most recently seen in the Gastroenterology Clinic on 09/30/22.  No interval change in medical history since that appointment. Please refer to that note for full details regarding GI history and clinical presentation.   Past Medical History:  Diagnosis Date   ALLERGIC RHINITIS 06/02/2007   Arthritis    "maybe in my right knee" (12/19/2015)   Coronary artery disease 11/2015   a. 11/2015: Inferior STEMI w/ Promus DES to RCA.     Diverticulosis of colon 2003   by 2003 and 2008 colonoscopy.    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   History of blood transfusion 1971   "when I had hysterectomy"   Myocardial infarction ([place]) 11/2015   OSTEOPENIA 06/02/2007   VAGINITIS, ATROPHIC, POSTMENOPAUSAL 12/06/2008   VERTIGO 02/16/2009    Past Surgical History:  Procedure Laterality Date   APPENDECTOMY  1971   CARDIAC CATHETERIZATION N/A 12/15/2015   Procedure: Left Heart Cath and Coronary Angiography;  Surgeon: Belva Crome, MD;  Location: [facility];  Service: Cardiovascular;  Laterality: N/A;   CARDIAC CATHETERIZATION N/A 12/15/2015   Procedure: Coronary Stent Intervention;  Surgeon: Belva Crome, MD;  Location: [facility];  Service: Cardiovascular;  Laterality: N/A;   COLONOSCOPY N/A 12/20/2015   Procedure: COLONOSCOPY;  Surgeon: Irene Shipper, MD;  Location: [facility];  Service: Endoscopy;  Laterality: N/A;   EYE SURGERY Bilateral    "laser tx for glaucoma"   HEMORRHOID SURGERY  ~ [facility] ARTHROSCOPY W/ ROTATOR CUFF REPAIR Right 11/2014   TOTAL ABDOMINAL HYSTERECTOMY  1971    Prior to Admission medications   Medication Sig Start Date End Date Taking? Authorizing Provider  Ascorbic Acid (VITAMIN C ER PO) Take 1 tablet by mouth daily.   Yes [provider]  aspirin 81 MG chewable tablet Chew 1 tablet (81 mg total) by mouth daily. 12/17/15  Yes Kilroy, Luke K, PA-C  atorvastatin (LIPITOR) 10 MG tablet TAKE 1 TABLET(10 MG) BY MOUTH DAILY Strength: 10 mg 04/07/22  Yes Belva Crome, MD  carboxymethylcellulose (REFRESH PLUS) 0.5 % SOLN Place 1 drop into both eyes 3 (three) times daily as needed (dry eyes).    Yes [provider]  MAGNESIUM PO Take 1 tablet by mouth daily.   Yes [provider]  Multiple Vitamin (MULTIVITAMIN WITH MINERALS) TABS tablet Take 1 tablet by mouth daily.   Yes [provider]  vitamin B-12 (CYANOCOBALAMIN) 100 MCG tablet Take 100 mcg by mouth daily.   Yes [provider]  vitamin E 1000 UNIT capsule Take 1,000 Units by mouth daily.   Yes [provider]  acetaminophen (TYLENOL) 325 MG tablet Take 2 tablets (650 mg total) by mouth every 4 (four) hours as needed for headache or mild pain. 12/17/15   Erlene Quan, PA-C  Current Outpatient Medications  Medication Sig Dispense Refill   Ascorbic Acid (VITAMIN C ER PO) Take 1 tablet by mouth daily.     aspirin 81 MG chewable tablet Chew 1 tablet (81 mg total) by mouth daily.     atorvastatin (LIPITOR) 10 MG tablet TAKE 1 TABLET(10 MG) BY MOUTH DAILY Strength: 10 mg 90 tablet 2   carboxymethylcellulose (REFRESH PLUS) 0.5 % SOLN Place 1 drop into both eyes 3 (three) times daily as needed (dry eyes).      MAGNESIUM PO Take 1 tablet by mouth daily.     Multiple Vitamin (MULTIVITAMIN WITH  MINERALS) TABS tablet Take 1 tablet by mouth daily.     vitamin B-12 (CYANOCOBALAMIN) 100 MCG tablet Take 100 mcg by mouth daily.     vitamin E 1000 UNIT capsule Take 1,000 Units by mouth daily.     acetaminophen (TYLENOL) 325 MG tablet Take 2 tablets (650 mg total) by mouth every 4 (four) hours as needed for headache or mild pain.     Current Facility-Administered Medications  Medication Dose Route Frequency Provider Last Rate Last Admin   0.9 %  sodium chloride infusion  500 mL Intravenous Once Sharyn Creamer, MD        Allergies as of 11/27/2022 - Review Complete 11/27/2022  Allergen Reaction Noted   Codeine sulfate Nausea And Vomiting 02/16/2009   Meclizine Other (See Comments) 12/25/2010   Acetaminophen Nausea Only 04/28/2016   Morphine  12/31/2018   Other  09/15/2021   Tramadol  12/31/2018    Family History  Problem Relation Age of Onset   Suicidality Mother    Heart disease Father    Arthritis Brother    Breast cancer Neg Hx    Colon cancer Neg Hx    Stomach cancer Neg Hx    Esophageal cancer Neg Hx     Social History   Socioeconomic History   Marital status: Widowed    Spouse name: Not on file   Number of children: 2   Years of education: Not on file   Highest education level: Not on file  Occupational History   Not on file  Tobacco Use   Smoking status: Never    Passive exposure: Past   Smokeless tobacco: Never  Vaping Use   Vaping Use: Never used  Substance and Sexual Activity   Alcohol use: Not Currently    Comment: sporadically   Drug use: No   Sexual activity: Not on file  Other Topics Concern   Not on file  Social History Narrative   Not on file   Social Determinants of Health   Financial Resource Strain: Not on file  Food Insecurity: Not on file  Transportation Needs: Not on file  Physical Activity: Not on file  Stress: Not on file  Social Connections: Not on file  Intimate Partner Violence: Not on file    Physical Exam: Vital signs  in last 24 hours: BP (!) 147/80   Pulse 89   Temp (!) 96.4 F (35.8 C)   Ht 5\' 6"  (1.676 m)   Wt 132 lb (59.9 kg)   SpO2 97%   BMI 21.31 kg/m  GEN: NAD EYE: Sclerae anicteric ENT: MMM CV: Non-tachycardic Pulm: No increased WOB GI: Soft NEURO:  Alert & Oriented   Christia Reading, MD [facility]   11/27/2022 8:37 AM

## 2022-11-27 NOTE — Progress Notes (Signed)
Called to room to assist during endoscopic procedure.  Patient ID and intended procedure confirmed with present staff. Received instructions for my participation in the procedure from the performing physician.  

## 2022-11-27 NOTE — Patient Instructions (Signed)
YOU HAD AN ENDOSCOPIC PROCEDURE TODAY AT THE [facility]:   Refer to the procedure report that was given to you for any specific questions about what was found during the examination.  If the procedure report does not answer your questions, please call your gastroenterologist to clarify.  If you requested that your care partner not be given the details of your procedure findings, then the procedure report has been included in a sealed envelope for you to review at your convenience later.  YOU SHOULD EXPECT: Some feelings of bloating in the abdomen. Passage of more gas than usual.  Walking can help get rid of the air that was put into your GI tract during the procedure and reduce the bloating. If you had a lower endoscopy (such as a colonoscopy or flexible sigmoidoscopy) you may notice spotting of blood in your stool or on the toilet paper. If you underwent a bowel prep for your procedure, you may not have a normal bowel movement for a few days.  Please Note:  You might notice some irritation and congestion in your nose or some drainage.  This is from the oxygen used during your procedure.  There is no need for concern and it should clear up in a day or so.  SYMPTOMS TO REPORT IMMEDIATELY:  Following lower endoscopy (colonoscopy or flexible sigmoidoscopy):  Excessive amounts of blood in the stool  Significant tenderness or worsening of abdominal pains  Swelling of the abdomen that is new, acute  Fever of 100F or higher  For urgent or emergent issues, a gastroenterologist can be reached at any hour by calling [phone]. Do not use MyChart messaging for urgent concerns.    DIET:  We do recommend a small meal at first, but then you may proceed to your regular diet.  Drink plenty of fluids but you should avoid alcoholic beverages for 24 hours.  ACTIVITY:  You should plan to take it easy for the rest of today and you should NOT DRIVE or use heavy machinery until tomorrow (because of  the sedation medicines used during the test).    FOLLOW UP: Our staff will call the number listed on your records the next business day following your procedure.  We will call around 7:15- 8:00 am to check on you and address any questions or concerns that you may have regarding the information given to you following your procedure. If we do not reach you, we will leave a message.     If any biopsies were taken you will be contacted by phone or by letter within the next 1-3 weeks.  Please call us at [phone] if you have not heard about the biopsies in 3 weeks.    SIGNATURES/CONFIDENTIALITY: You and/or your care partner have signed paperwork which will be entered into your electronic medical record.  These signatures attest to the fact that that the information above on your After Visit Summary has been reviewed and is understood.  Full responsibility of the confidentiality of this discharge information lies with you and/or your care-partner.  

## 2022-11-28 ENCOUNTER — Telehealth: Payer: Self-pay

## 2022-11-28 NOTE — Telephone Encounter (Signed)
  Follow up Call-     11/27/2022    8:14 AM  Call back number  Post procedure Call Back phone  # [phone]  Permission to leave phone message Yes     Patient questions:  Do you have a fever, pain , or abdominal swelling? No. Pain Score  0 *  Have you tolerated food without any problems? Yes.    Have you been able to return to your normal activities? Yes.    Do you have any questions about your discharge instructions: Diet   No. Medications  No. Follow up visit  No.  Do you have questions or concerns about your Care? No.  Actions: * If pain score is 4 or above: No action needed, pain <4.

## 2022-12-01 ENCOUNTER — Encounter: Payer: Self-pay | Admitting: Internal Medicine

## 2022-12-08 ENCOUNTER — Ambulatory Visit: Payer: Medicare Other

## 2022-12-16 ENCOUNTER — Ambulatory Visit
Admission: RE | Admit: 2022-12-16 | Discharge: 2022-12-16 | Disposition: A | Discharge status: Home / Self Care | Payer: Medicare Other | Source: Ambulatory Visit | Attending: Internal Medicine | Admitting: Internal Medicine

## 2022-12-16 ENCOUNTER — Ambulatory Visit: Payer: Medicare Other

## 2022-12-16 DIAGNOSIS — Z1231 Encounter for screening mammogram for malignant neoplasm of breast: Secondary | ICD-10-CM

## 2022-12-22 ENCOUNTER — Encounter: Payer: Self-pay | Admitting: Internal Medicine

## 2022-12-22 LAB — HM MAMMOGRAPHY

## 2023-01-08 DIAGNOSIS — H6123 Impacted cerumen, bilateral: Secondary | ICD-10-CM | POA: Diagnosis not present

## 2023-01-08 DIAGNOSIS — Z6820 Body mass index (BMI) 20.0-20.9, adult: Secondary | ICD-10-CM | POA: Diagnosis not present

## 2023-01-12 ENCOUNTER — Other Ambulatory Visit: Payer: Self-pay | Admitting: *Deleted

## 2023-01-12 MED ORDER — ATORVASTATIN CALCIUM 10 MG PO TABS: ORAL_TABLET | ORAL | 3 refills | Status: DC

## 2023-01-29 ENCOUNTER — Ambulatory Visit: Payer: Medicare Other | Attending: Cardiology | Admitting: Cardiology

## 2023-01-29 ENCOUNTER — Encounter: Payer: Self-pay | Admitting: Cardiology

## 2023-01-29 VITALS — BP 120/90 | HR 75 | Ht 66.0 in | Wt 132.0 lb

## 2023-01-29 DIAGNOSIS — R0602 Shortness of breath: Secondary | ICD-10-CM | POA: Insufficient documentation

## 2023-01-29 DIAGNOSIS — I25119 Atherosclerotic heart disease of native coronary artery with unspecified angina pectoris: Secondary | ICD-10-CM | POA: Insufficient documentation

## 2023-01-29 NOTE — Patient Instructions (Signed)
Medication Instructions:  The current medical regimen is effective;  continue present plan and medications.  *If you need a refill on your cardiac medications before your next appointment, please call your pharmacy*  Testing/Procedures: Your physician has requested that you have an echocardiogram. Echocardiography is a painless test that uses sound waves to create images of your heart. It provides your doctor with information about the size and shape of your heart and how well your heart's chambers and valves are working. This procedure takes approximately one hour. There are no restrictions for this procedure. Please do NOT wear cologne, perfume, aftershave, or lotions (deodorant is allowed). Please arrive 15 minutes prior to your appointment time.  Follow-Up: At [facility], you and your health needs are our priority.  As part of our continuing mission to provide you with exceptional heart care, we have created designated Provider Care Teams.  These Care Teams include your primary Cardiologist (physician) and Advanced Practice Providers (APPs -  Physician Assistants and Nurse Practitioners) who all work together to provide you with the care you need, when you need it.  We recommend signing up for the patient portal called "MyChart".  Sign up information is provided on this After Visit Summary.  MyChart is used to connect with patients for Virtual Visits (Telemedicine).  Patients are able to view lab/test results, encounter notes, upcoming appointments, etc.  Non-urgent messages can be sent to your provider as well.   To learn more about what you can do with MyChart, go to [email or web address].    Your next appointment:   1 year(s)  Provider:   Mark Skains, MD      

## 2023-01-29 NOTE — Progress Notes (Signed)
Cardiology Office Note:    Date:  01/29/2023   ID:  Nancy Lankuth S Fennel, DOB 04/19/1932, MRN [id number]  PCP:  Myrlene Brokerrawford, Elizabeth A, MD   [facility] Providers Cardiologist:  Donato SchultzMark Beatric Fulop, MD     Referring MD: Myrlene Brokerrawford, Elizabeth A, *    History of Present Illness:    Nancy Neal is a 89 y.o. female former patient of Dr. Verdis PrimeHenry Smith is here for the follow-up of coronary artery disease status postacute inferior myocardial infarction with stent placement to the RCA DES in February 2017 and residual 60 to 70% mid LAD lesion with normal residual LV function and hyperlipidemia.  Doing well without any chest discomfort fevers chills nausea vomiting syncope bleeding.  She has had intolerance to many medications in the past.  Feels tired at times.  Has occasional intermittent chest discomfort.  Enjoyed [place] cruise with sister.  Sister who is 8590, has 4.8cm, other sister 4.3cm.  We were able to personally review and interpret her CT scan of chest abdomen and pelvis from December 2022 which showed no evidence of aortic aneurysm.  D[place] [place] Feels SOB.   Enjoys playing bridge.  Traveling.  Canceled an upcoming trip after her son's death.  Has had some trouble with insomnia.  Daughter and sister have been staying with her.   Past Medical History:  Diagnosis Date   ALLERGIC RHINITIS 06/02/2007   Arthritis    "maybe in my right knee" (12/19/2015)   Coronary artery disease 11/2015   a. 11/2015: Inferior STEMI w/ Promus DES to RCA.     Diverticulosis of colon 2003   by 2003 and 2008 colonoscopy.    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   History of blood transfusion 1971   "when I had hysterectomy"   Myocardial infarction 11/2015   OSTEOPENIA 06/02/2007   VAGINITIS, ATROPHIC, POSTMENOPAUSAL 12/06/2008   VERTIGO 02/16/2009    Past Surgical History:  Procedure Laterality Date   APPENDECTOMY  1971   CARDIAC CATHETERIZATION  N/A 12/15/2015   Procedure: Left Heart Cath and Coronary Angiography;  Surgeon: Lyn RecordsHenry W Smith, MD;  [facility];  Service: Cardiovascular;  Laterality: N/A;   CARDIAC CATHETERIZATION N/A 12/15/2015   Procedure: Coronary Stent Intervention;  Surgeon: Lyn RecordsHenry W Smith, MD;  [facility];  Service: Cardiovascular;  Laterality: N/A;   COLONOSCOPY N/A 12/20/2015   Procedure: COLONOSCOPY;  Surgeon: Hilarie FredricksonJohn N Perry, MD;  [facility];  Service: Endoscopy;  Laterality: N/A;   EYE SURGERY Bilateral    "laser tx for glaucoma"   HEMORRHOID SURGERY  ~ 1968   SHOULDER ARTHROSCOPY W/ ROTATOR CUFF REPAIR Right 11/2014   TOTAL ABDOMINAL HYSTERECTOMY  1971    Current Medications: Current Meds  Medication Sig   acetaminophen (TYLENOL) 325 MG tablet Take 2 tablets (650 mg total) by mouth every 4 (four) hours as needed for headache or mild pain.   Ascorbic Acid (VITAMIN C ER PO) Take 1 tablet by mouth daily.   aspirin 81 MG chewable tablet Chew 1 tablet (81 mg total) by mouth daily.   atorvastatin (LIPITOR) 10 MG tablet TAKE 1 TABLET(10 MG) BY MOUTH DAILY Strength: 10 mg   carboxymethylcellulose (REFRESH PLUS) 0.5 % SOLN Place 1 drop into both eyes 3 (three) times daily as needed (dry eyes).    MAGNESIUM PO Take 1 tablet by mouth daily.   Multiple Vitamin (MULTIVITAMIN WITH MINERALS) TABS tablet  Take 1 tablet by mouth daily.   nitroGLYCERIN (NITROSTAT) 0.4 MG SL tablet Place 0.4 mg under the tongue as needed for chest pain.   vitamin B-12 (CYANOCOBALAMIN) 100 MCG tablet Take 100 mcg by mouth daily.   vitamin E 1000 UNIT capsule Take 1,000 Units by mouth daily.     Allergies:   Codeine sulfate, Meclizine, Acetaminophen, Morphine, Other, and Tramadol   Social History   Socioeconomic History   Marital status: Widowed    Spouse name: Not on file   Number of children: 2   Years of education: Not on file   Highest education level: Not on file  Occupational History   Not  on file  Tobacco Use   Smoking status: Never    Passive exposure: Past   Smokeless tobacco: Never  Vaping Use   Vaping Use: Never used  Substance and Sexual Activity   Alcohol use: Not Currently    Comment: sporadically   Drug use: No   Sexual activity: Not on file  Other Topics Concern   Not on file  Social History Narrative   Not on file   Social Determinants of Health   Financial Resource Strain: Not on file  Food Insecurity: Not on file  Transportation Needs: Not on file  Physical Activity: Not on file  Stress: Not on file  Social Connections: Not on file     Family History: The patient's family history includes Arthritis in her brother; Heart disease in her father; Suicidality in her mother. There is no history of Breast cancer, Colon cancer, Stomach cancer, or Esophageal cancer.  ROS:   Please see the history of present illness.    No fevers chills nausea vomiting syncope bleeding all other systems reviewed and are negative.  EKGs/Labs/Other Studies Reviewed:    The following studies were reviewed today: Cardiac Studies & Procedures   CARDIAC CATHETERIZATION  CARDIAC CATHETERIZATION 12/15/2015  Narrative 1. Prox LAD to Mid LAD lesion, 65% stenosed. 2. Prox RCA lesion, 100% stenosed. Post intervention, there is a 0% residual stenosis. 3. The left ventricular systolic function is normal. 4. RPDA lesion, 100% stenosed.   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  Successful angioplasty and stenting of the right coronary from 100% to 0% using 3.0 x 20 Promus Premier drug-eluting stent postdilated to 3.25 mm in diameter.  Complicated by transient no reflow treated with pharmacology (IC verapamil, IC nitroglycerin, and IV Aggrastat).  Moderate proximal to mid LAD with 60-70% stenosis.  Marked tortuosity noted in a normal circumflex  coronary artery   Recommendations:   Aggressive management of risk factors (elevated lipids)  Aggrastat infusion for 18 hours due to heavy thrombus burden within the treated segment.  Low-dose beta blocker therapy  OP functional testing to exclude the possibility of left anterior descending ischemia.  Potential discharge 48-72 hours depending upon course  Findings Coronary Findings Diagnostic  Dominance: Right  Left Anterior Descending The lesion is type C Calcified diffuse eccentric.  Ramus Intermedius . Vessel is small.  Left Circumflex . Vessel is small.  First Obtuse Marginal Branch The vessel is small in size.  Second Obtuse Marginal Branch The vessel is large in size.  Third Obtuse Marginal Branch The vessel is small in size.  Right Coronary Artery The lesion is type C With heavy thrombus eccentric.  Right Posterior Descending Artery The right coronary has an  extreme leftward origin from the right sinus of Valsalva making cannulation using a Judkins right guide catheter from the right radial approach impossible.  Intervention  Prox RCA lesion PCI There is no pre-interventional antegrade distal flow (TIMI 0). Pre-stent angioplasty was performed. A drug-eluting stent was placed. Post-stent angioplasty was performed. The post-interventional distal flow is normal (TIMI 3). The intervention was successful. The patient experienced the no reflow phenomenon in this vessel. Pressure wire/FFR was not performed on the lesion. There is a 0% residual stenosis post intervention.   STRESS TESTS  MYOCARDIAL PERFUSION IMAGING 01/03/2016  Narrative  The left ventricular ejection fraction is normal (55-65%).  Nuclear stress EF: 63%.  There was no ST segment deviation noted during stress.  No T wave inversion was noted during stress.  Defect 1: There is a small defect of mild severity.  This is a low risk study.  Small, mild fixed septal artifact. No significant  reversible ischemia. LVEF 63% with normal wall motion. This is a low risk study.               EKG: 01/29/2023-sinus rhythm 75 PACs with aberrant conduction or PVC.  Recent Labs: 10/30/2022: ALT 14; BUN 24; Creatinine, Ser 0.97; Hemoglobin 14.9; Platelets 235.0; Potassium 5.2 No hemolysis seen; Sodium 141  Recent Lipid Panel    Component Value Date/Time   CHOL 141 10/30/2022 0959   CHOL 128 01/02/2020 1030   TRIG 65.0 10/30/2022 0959   HDL 66.30 10/30/2022 0959   HDL 66 01/02/2020 1030   CHOLHDL 2 10/30/2022 0959   VLDL 13.0 10/30/2022 0959   LDLCALC 62 10/30/2022 0959   LDLCALC 47 01/02/2020 1030   LDLDIRECT 123.2 12/18/2011 0954     Risk Assessment/Calculations:              Physical Exam:    VS:  BP (!) 120/90 (BP Location: Left Arm, Patient Position: Sitting, Cuff Size: Normal)   Pulse 75   Ht 5\' 6"  (1.676 m)   Wt 132 lb (59.9 kg)   BMI 21.31 kg/m     Wt Readings from Last 3 Encounters:  01/29/23 132 lb (59.9 kg)  11/27/22 132 lb (59.9 kg)  10/30/22 134 lb (60.8 kg)     GEN:  Well nourished, well developed in no acute distress HEENT: Normal NECK: No JVD; No carotid bruits LYMPHATICS: No lymphadenopathy CARDIAC: RRR, no murmurs, rubs, gallops RESPIRATORY:  Clear to auscultation without rales, wheezing or rhonchi  ABDOMEN: Soft, non-tender, non-distended MUSCULOSKELETAL:  No edema; No deformity  SKIN: Warm and dry NEUROLOGIC:  Alert and oriented x 3 PSYCHIATRIC:  Normal affect   ASSESSMENT:    1. Coronary artery disease involving native coronary artery of native heart with angina pectoris   2. Shortness of breath    PLAN:    In order of problems listed above:  Coronary artery disease - Overall doing well with secondary prevention strategies.  Continue with current medication management.  Hypercholesterolemia - LDL goal less than 70.  Less than 55 would be exceptional.  Continue with statin use.  Dyspnea -- We will check an  echocardiogram.  Grief - She lost her son in March 2024.  Grieving.  We discussed at length.  Family history of AAA -Thankfully in 2022 she had a CT of chest abdomen pelvis and no signs of aneurysm were noted.  Overall education and awareness concerning secondary risk prevention was discussed in detail: LDL less than 70, hemoglobin A1c less than 7, blood pressure target  less than 130/80 mmHg, >150 minutes of moderate aerobic activity per week, avoidance of smoking, weight control (via diet and exercise), and continued surveillance/management of/for obstructive sleep apnea.           Medication Adjustments/Labs and Tests Ordered: Current medicines are reviewed at length with the patient today.  Concerns regarding medicines are outlined above.  Orders Placed This Encounter  Procedures   EKG 12-Lead   ECHOCARDIOGRAM COMPLETE   No orders of the defined types were placed in this encounter.   Patient Instructions  Medication Instructions:  The current medical regimen is effective;  continue present plan and medications.  *If you need a refill on your cardiac medications before your next appointment, please call your pharmacy*  Testing/Procedures: Your physician has requested that you have an echocardiogram. Echocardiography is a painless test that uses sound waves to create images of your heart. It provides your doctor with information about the size and shape of your heart and how well your heart's chambers and valves are working. This procedure takes approximately one hour. There are no restrictions for this procedure. Please do NOT wear cologne, perfume, aftershave, or lotions (deodorant is allowed). Please arrive 15 minutes prior to your appointment time.  Follow-Up: At [facility] you and your health needs are our priority.  As part of our continuing mission to provide you with exceptional heart care, we have created designated Provider Care Teams.  These Care Teams  include your primary Cardiologist (physician) and Advanced Practice Providers (APPs -  Physician Assistants and Nurse Practitioners) who all work together to provide you with the care you need, when you need it.  We recommend signing up for the patient portal called "MyChart".  Sign up information is provided on this After Visit Summary.  MyChart is used to connect with patients for Virtual Visits (Telemedicine).  Patients are able to view lab/test results, encounter notes, upcoming appointments, etc.  Non-urgent messages can be sent to your provider as well.   To learn more about what you can do with MyChart, go to [email or web address].    Your next appointment:   1 year(s)  Provider:   Donato Schultz, MD        Signed, Donato Schultz, MD  01/29/2023 11:06 AM    [facility]

## 2023-02-20 ENCOUNTER — Ambulatory Visit (HOSPITAL_COMMUNITY): Payer: Medicare Other | Attending: Internal Medicine

## 2023-02-20 DIAGNOSIS — R0602 Shortness of breath: Secondary | ICD-10-CM | POA: Insufficient documentation

## 2023-02-20 LAB — ECHOCARDIOGRAM COMPLETE
Area-P 1/2: 3.48 cm2
P 1/2 time: 494 msec
S' Lateral: 2.5 cm

## 2023-02-24 ENCOUNTER — Encounter: Payer: Self-pay | Admitting: Podiatry

## 2023-02-24 ENCOUNTER — Ambulatory Visit (INDEPENDENT_AMBULATORY_CARE_PROVIDER_SITE_OTHER): Payer: Medicare Other | Admitting: Podiatry

## 2023-02-24 DIAGNOSIS — M79676 Pain in unspecified toe(s): Secondary | ICD-10-CM | POA: Diagnosis not present

## 2023-02-24 DIAGNOSIS — L603 Nail dystrophy: Secondary | ICD-10-CM | POA: Diagnosis not present

## 2023-02-24 DIAGNOSIS — B351 Tinea unguium: Secondary | ICD-10-CM | POA: Diagnosis not present

## 2023-02-25 NOTE — Progress Notes (Signed)
She presents today chief complaint of a painful hallux nail right.  She states that is getting thick and painful.  Objective: Vital signs are stable she is alert and oriented x 3 toenails of left foot appear to be normal toenails of the right foot are all thick yellow dystrophic clinically mycotic painful palpation.  The hallux nail left in particular is loose.  There is subungual debris discoloration.  Assessment: Pain in limb secondary to onychomycosis 1 through 5 right.  Plan: Debrided toenails 1 through 5 bilateral.

## 2023-03-02 DIAGNOSIS — D225 Melanocytic nevi of trunk: Secondary | ICD-10-CM | POA: Diagnosis not present

## 2023-03-02 DIAGNOSIS — L820 Inflamed seborrheic keratosis: Secondary | ICD-10-CM | POA: Diagnosis not present

## 2023-03-02 DIAGNOSIS — Z1283 Encounter for screening for malignant neoplasm of skin: Secondary | ICD-10-CM | POA: Diagnosis not present

## 2023-03-13 ENCOUNTER — Ambulatory Visit: Payer: Medicare Other | Admitting: Internal Medicine

## 2023-03-20 ENCOUNTER — Encounter: Payer: Self-pay | Admitting: Internal Medicine

## 2023-03-20 ENCOUNTER — Ambulatory Visit (INDEPENDENT_AMBULATORY_CARE_PROVIDER_SITE_OTHER): Payer: Medicare Other | Admitting: Internal Medicine

## 2023-03-20 VITALS — BP 118/74 | HR 60 | Ht 66.0 in | Wt 130.0 lb

## 2023-03-20 DIAGNOSIS — M810 Age-related osteoporosis without current pathological fracture: Secondary | ICD-10-CM | POA: Diagnosis not present

## 2023-03-20 NOTE — Progress Notes (Signed)
Name: Nancy Neal  MRN/ DOB: [id number], 10/24/1931    Age/ Sex: 89 y.o., female    PCP: Myrlene Broker, MD   Reason for Endocrinology Evaluation: Osteoporosis     Date of Initial Endocrinology Evaluation: 03/20/2023     HPI: Ms. Nancy Neal is a 89 y.o. female with a past medical history of osteoporosis and dyslipidemia. The patient presented for initial endocrinology clinic visit on 03/20/2023 for consultative assistance with her osteoporosis  Pt was diagnosed with osteoporosis: 11/2021  Menarche at age : ~67 yrs  Menopausal at age : Hysterectomy with oophorectomy at age 77 Fracture Hx: no Hx of HRT: yes for a few years  FH of osteoporosis or hip fracture:  Prior Hx of anti-resorptive therapy : n/a  She started on Osteo strong in  05/2023  NO recent falls  Has acid reflux symptoms  She has arthralgias  She has one leg shorter than the other  Denies constipation, used to have loose stools     MVI Calcium 600+ 200  Vit 5000+ 10,000+ 1000 + 500  HISTORY:  Past Medical History:  Past Medical History:  Diagnosis Date   ALLERGIC RHINITIS 06/02/2007   Arthritis    "maybe in my right knee" (12/19/2015)   Coronary artery disease 11/2015   a. 11/2015: Inferior STEMI w/ Promus DES to RCA.     Diverticulosis of colon 2003   by 2003 and 2008 colonoscopy.    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   History of blood transfusion 1971   "when I had hysterectomy"   Myocardial infarction (HCC) 11/2015   OSTEOPENIA 06/02/2007   VAGINITIS, ATROPHIC, POSTMENOPAUSAL 12/06/2008   VERTIGO 02/16/2009   Past Surgical History:  Past Surgical History:  Procedure Laterality Date   APPENDECTOMY  1971   CARDIAC CATHETERIZATION N/A 12/15/2015   Procedure: Left Heart Cath and Coronary Angiography;  Surgeon: Lyn Records, MD;  Location[facility] INVASIVE CV LAB;  Service: Cardiovascular;  Laterality: N/A;   CARDIAC CATHETERIZATION N/A 12/15/2015   Procedure:  Coronary Stent Intervention;  Surgeon: Lyn Records, MD;  Location: [facility] INVASIVE CV LAB;  Service: Cardiovascular;  Laterality: N/A;   COLONOSCOPY N/A 12/20/2015   Procedure: COLONOSCOPY;  Surgeon: Hilarie Fredrickson, MD;  Location: [facility] ENDOSCOPY;  Service: Endoscopy;  Laterality: N/A;   EYE SURGERY Bilateral    "laser tx for glaucoma"   HEMORRHOID SURGERY  ~ 1968   SHOULDER ARTHROSCOPY W/ ROTATOR CUFF REPAIR Right 11/2014   TOTAL ABDOMINAL HYSTERECTOMY  1971    Social History:  reports that she has never smoked. She has been exposed to tobacco smoke. She has never used smokeless tobacco. She reports that she does not currently use alcohol. She reports that she does not use drugs. Family History: family history includes Arthritis in her brother; Heart disease in her father; Suicidality in her mother.   HOME MEDICATIONS: Allergies as of 03/20/2023       Reactions   Codeine Sulfate Nausea And Vomiting   Meclizine Other (See Comments)   Other reaction(s): Other (See Comments) glaucoma glaucoma   Acetaminophen Nausea Only   Pain Medicines make her sick   Morphine    Other    "All painkillers" cause nausea   Tramadol         Medication List        Accurate as of Mar 20, 2023  2:24 PM. If you have any questions, ask  your nurse or doctor.          acetaminophen 325 MG tablet Commonly known as: TYLENOL Take 2 tablets (650 mg total) by mouth every 4 (four) hours as needed for headache or mild pain.   aspirin 81 MG chewable tablet Chew 1 tablet (81 mg total) by mouth daily.   atorvastatin 10 MG tablet Commonly known as: LIPITOR TAKE 1 TABLET(10 MG) BY MOUTH DAILY Strength: 10 mg   carboxymethylcellulose 0.5 % Soln Commonly known as: REFRESH PLUS Place 1 drop into both eyes 3 (three) times daily as needed (dry eyes).   cetirizine 10 MG chewable tablet Commonly known as: ZYRTEC Chew 10 mg by mouth daily.   Collagenase Powd by Does not apply route.   CULTURELLE PROBIOTICS  PO Take by mouth.   ESTER-C PO Take by mouth.   MAGNESIUM PO Take 1 tablet by mouth daily.   multivitamin with minerals Tabs tablet Take 1 tablet by mouth daily.   nitroGLYCERIN 0.4 MG SL tablet Commonly known as: NITROSTAT Place 0.4 mg under the tongue as needed for chest pain.   NON FORMULARY LADDER WEIGHT PROTEIN   vitamin B-12 100 MCG tablet Commonly known as: CYANOCOBALAMIN Take 100 mcg by mouth daily.   VITAMIN C ER PO Take 1 tablet by mouth daily.   Vitamin D3 250 MCG (10000 UT) Tabs Take 1 capsule by mouth daily.   vitamin E 1000 UNIT capsule Take 1,000 Units by mouth daily.          REVIEW OF SYSTEMS: A comprehensive ROS was conducted with the patient and is negative except as per HPI and below:  ROS     OBJECTIVE:  VS: BP 118/74 (BP Location: Left Arm, Patient Position: Sitting, Cuff Size: Small)   Pulse 60   Ht 5\' 6"  (1.676 m)   Wt 130 lb (59 kg)   SpO2 99%   BMI 20.98 kg/m    Wt Readings from Last 3 Encounters:  03/20/23 130 lb (59 kg)  01/29/23 132 lb (59.9 kg)  11/27/22 132 lb (59.9 kg)     EXAM: General: Pt appears well and is in NAD  Eyes: External eye exam normal without stare, lid lag or exophthalmos.  EOM intact.  PERRL.  Neck: General: Supple without adenopathy. Thyroid: Thyroid size normal.  No goiter or nodules appreciated. No thyroid bruit.  Lungs: Clear with good BS bilat   Heart: Auscultation: RRR.  Abdomen: Soft, nontender  Extremities:  BL LE: No pretibial edema   Mental Status: Judgment, insight: Intact Orientation: Oriented to time, place, and person Mood and affect: No depression, anxiety, or agitation     DATA REVIEWED:     Latest Reference Range & Units 10/30/22 09:59  Sodium 135 - 145 mEq/L 141  Potassium 3.5 - 5.1 mEq/L 5.2 No hemolysis seen (H)  Chloride 96 - 112 mEq/L 106  CO2 19 - 32 mEq/L 31  Glucose 70 - 99 mg/dL 91  BUN 6 - 23 mg/dL 24 (H)  Creatinine 1.61 - 1.20 mg/dL 0.96  Calcium 8.4 -  04.5 mg/dL 9.6  Alkaline Phosphatase 39 - 117 U/L 77  Albumin 3.5 - 5.2 g/dL 4.3  AST 0 - 37 U/L 19  ALT 0 - 35 U/L 14  Total Protein 6.0 - 8.3 g/dL 6.6  Total Bilirubin 0.2 - 1.2 mg/dL 1.3 (H)  GFR >40.98 mL/min 52.27 (L)    Latest Reference Range & Units 10/30/22 09:59  VITD 30.00 - 100.00 ng/mL 76.84  DXA 11/22/2021 The BMD measured at Forearm Radius 33% is 0.565 g/cm2 with a T-score of -3.5. This patient is considered osteoporotic according to World Health Organization [facility]) criteria.     Site Region Measured Date Measured Age YA BMD Significant CHANGE T-score Left Forearm Radius 33% 11/22/2021 87.2 -3.5 0.565 g/cm2   DualFemur Neck Right 11/22/2021 87.2 -2.7 0.662 g/cm2   DualFemur Total Mean 11/22/2021 87.2 -2.1 0.741 g/cm2   ASSESSMENT/PLAN/RECOMMENDATIONS:   Osteoporosis:  -Patient with severe osteoporosis -She is on sufficient amount of calcium and vitamin D - she has been doing weightbearing exercises at home as well as to assure strong -She does decline all antiresorptive therapy due to concerns regarding side effects -Patient would like to continue with lifestyle changes for another year, and recheck bone density before to committing to any antiresorptive therapy -I did explain to the patient that her osteoporosis is severe and she is at a high risk for fracture, patient understands that risk and has made an informed decision to forego treatment at this time -I would recommend Prolia if she agrees to treatment  Follow-up in 1 year  Signed electronically by: Lyndle Herrlich, MD  [facility]  [facility] [address] [place], [place] [address] Phone: [phone] FAX: [phone]   CC: Myrlene Broker, MD [address] [place] [address] Phone: [phone] Fax: [phone]   Return to Endocrinology clinic as below: Future Appointments  Date Time Provider Department Center  07/08/2023   3:15 PM Freddie Breech, DPM [facility]

## 2023-05-07 DIAGNOSIS — H401122 Primary open-angle glaucoma, left eye, moderate stage: Secondary | ICD-10-CM | POA: Diagnosis not present

## 2023-05-07 DIAGNOSIS — H401111 Primary open-angle glaucoma, right eye, mild stage: Secondary | ICD-10-CM | POA: Diagnosis not present

## 2023-05-07 DIAGNOSIS — Z961 Presence of intraocular lens: Secondary | ICD-10-CM | POA: Diagnosis not present

## 2023-06-04 DIAGNOSIS — J309 Allergic rhinitis, unspecified: Secondary | ICD-10-CM | POA: Diagnosis not present

## 2023-06-04 DIAGNOSIS — H90A32 Mixed conductive and sensorineural hearing loss, unilateral, left ear with restricted hearing on the contralateral side: Secondary | ICD-10-CM | POA: Diagnosis not present

## 2023-06-08 ENCOUNTER — Encounter (HOSPITAL_COMMUNITY): Payer: Self-pay

## 2023-06-08 ENCOUNTER — Other Ambulatory Visit: Payer: Self-pay

## 2023-06-08 ENCOUNTER — Emergency Department (HOSPITAL_COMMUNITY)
Admission: EM | Admit: 2023-06-08 | Discharge: 2023-06-08 | Disposition: A | Discharge status: Home / Self Care | Payer: Medicare Other | Attending: Emergency Medicine | Admitting: Emergency Medicine

## 2023-06-08 ENCOUNTER — Emergency Department (HOSPITAL_COMMUNITY): Payer: Medicare Other

## 2023-06-08 DIAGNOSIS — Z7982 Long term (current) use of aspirin: Secondary | ICD-10-CM | POA: Diagnosis not present

## 2023-06-08 DIAGNOSIS — S0990XA Unspecified injury of head, initial encounter: Secondary | ICD-10-CM | POA: Diagnosis not present

## 2023-06-08 DIAGNOSIS — W01198A Fall on same level from slipping, tripping and stumbling with subsequent striking against other object, initial encounter: Secondary | ICD-10-CM | POA: Diagnosis not present

## 2023-06-08 DIAGNOSIS — R519 Headache, unspecified: Secondary | ICD-10-CM | POA: Insufficient documentation

## 2023-06-08 DIAGNOSIS — W19XXXA Unspecified fall, initial encounter: Secondary | ICD-10-CM

## 2023-06-08 DIAGNOSIS — M542 Cervicalgia: Secondary | ICD-10-CM | POA: Insufficient documentation

## 2023-06-08 DIAGNOSIS — M50222 Other cervical disc displacement at C5-C6 level: Secondary | ICD-10-CM | POA: Diagnosis not present

## 2023-06-08 DIAGNOSIS — Y92000 Kitchen of unspecified non-institutional (private) residence as  the place of occurrence of the external cause: Secondary | ICD-10-CM | POA: Insufficient documentation

## 2023-06-08 DIAGNOSIS — S199XXA Unspecified injury of neck, initial encounter: Secondary | ICD-10-CM | POA: Diagnosis not present

## 2023-06-08 MED ORDER — TIZANIDINE HCL 4 MG PO TABS: 4.0000 mg | ORAL_TABLET | Freq: Four times a day (QID) | ORAL | 0 refills | Status: DC | PRN

## 2023-06-08 MED ORDER — LIDOCAINE 5 % EX PTCH
1.0000 | MEDICATED_PATCH | CUTANEOUS | Status: DC
  Administered 2023-06-08: 1 via TRANSDERMAL
  Filled 2023-06-08: qty 1

## 2023-06-08 NOTE — ED Triage Notes (Signed)
Pt came in via POV d/t falling 8 days ago after tripping forward in her kitchen & hitting the corner of the counter on the top of her head on the way dow. A/Ox4, denies LOC, not on thinners. States it did not bleed & she had a HA the rest of that weekend & did not know she head small gash on her head until she had a hair appointment 4 days ago. Only c/o a mild HA currently but her neck is sore more when she turns it to the Rt.

## 2023-06-08 NOTE — Discharge Instructions (Signed)
It was a pleasure taking part in your care today.  As we discussed, your CT scans of head and neck were unremarkable.  I prescribed you a low-dose muscle relaxer.  Please take this twice daily as needed.  If you do take the muscle relaxer, as we discussed, please do not drive or operate heavy machinery.  Please also begin taking 600 mg of ibuprofen or 650 mg of Tylenol every 6 hours as needed for pain in your neck.  You may also purchase Salonpas patches which are over-the-counter and at your drugstore.  Please follow-up with your PCP this week for reevaluation.  Please return to the ED with any new or worsening signs or symptoms.

## 2023-06-08 NOTE — ED Provider Notes (Signed)
[place] EMERGENCY DEPARTMENT AT [facility] Provider Note   CSN: [id number] Arrival date & time: 06/08/23  4696     History  Chief Complaint  Patient presents with   Fall   Headache   Sore Neck    Nancy Neal is a 89 y.o. female with medical history of GERD, vertigo.  Patient presents to ED for evaluation of fall.  Patient reports that 8 days ago she had a mechanical fall preceded by her tripping over her slippers.  She states that she did not have any preceding chest pain or shortness of breath.  She denies losing consciousness, denies taking blood thinners.  She reports that she has had waxing and waning headache for the last 8 days.  She has not tried taking Tylenol or ibuprofen for this.  She reports that she wanted to "wait it out" and be seen by her doctor this morning however she called her doctor's office this morning and they advised her that she should report to the ED for scans.  She denies any nausea, vomiting, chest pain, shortness of breath, one-sided weakness or numbness, lightheadedness, dizziness, weakness.  She is complaining of a headache rated at a 2 out of 10 on the pain scale, neck pain.   Fall Associated symptoms include headaches.  Headache Associated symptoms: neck pain        Home Medications Prior to Admission medications   Medication Sig Start Date End Date Taking? Authorizing Provider  tiZANidine (ZANAFLEX) 4 MG tablet Take 1 tablet (4 mg total) by mouth every 6 (six) hours as needed for muscle spasms. 06/08/23  Yes Al Decant, PA-C  acetaminophen (TYLENOL) 325 MG tablet Take 2 tablets (650 mg total) by mouth every 4 (four) hours as needed for headache or mild pain. 12/17/15   Abelino Derrick, PA-C  Ascorbic Acid (VITAMIN C ER PO) Take 1 tablet by mouth daily.    [provider]  aspirin 81 MG chewable tablet Chew 1 tablet (81 mg total) by mouth daily. 12/17/15   Abelino Derrick, PA-C  atorvastatin (LIPITOR) 10 MG tablet TAKE  1 TABLET(10 MG) BY MOUTH DAILY Strength: 10 mg 01/12/23   Jake Bathe, MD  Bioflavonoid Products (ESTER-C PO) Take by mouth.    [provider]  carboxymethylcellulose (REFRESH PLUS) 0.5 % SOLN Place 1 drop into both eyes 3 (three) times daily as needed (dry eyes).     [provider]  cetirizine (ZYRTEC) 10 MG chewable tablet Chew 10 mg by mouth daily.    [provider]  Cholecalciferol (VITAMIN D3) 250 MCG (10000 UT) TABS Take 1 capsule by mouth daily.    [provider]  Collagenase POWD by Does not apply route.    [provider]  MAGNESIUM PO Take 1 tablet by mouth daily.    [provider]  Multiple Vitamin (MULTIVITAMIN WITH MINERALS) TABS tablet Take 1 tablet by mouth daily.    [provider]  nitroGLYCERIN (NITROSTAT) 0.4 MG SL tablet Place 0.4 mg under the tongue as needed for chest pain. 06/10/18   [provider]  NON FORMULARY LADDER WEIGHT PROTEIN    [provider]  Probiotic Product (CULTURELLE PROBIOTICS PO) Take by mouth.    [provider]  vitamin B-12 (CYANOCOBALAMIN) 100 MCG tablet Take 100 mcg by mouth daily.    [provider]  vitamin E 1000 UNIT capsule Take 1,000 Units by mouth daily.    [provider]  Allergies    Codeine sulfate, Meclizine, Acetaminophen, Morphine, Other, and Tramadol    Review of Systems   Review of Systems  Musculoskeletal:  Positive for neck pain.  Neurological:  Positive for headaches. Negative for syncope.  All other systems reviewed and are negative.   Physical Exam Updated Vital Signs BP (!) 148/77   Pulse 74   Temp 97.6 F (36.4 C) (Oral)   Resp 18   Ht 5' 5.5" (1.664 m)   Wt 59 kg   SpO2 98%   BMI 21.30 kg/m  Physical Exam Vitals and nursing note reviewed.  Constitutional:      General: She is not in acute distress.    Appearance: Normal appearance. She is well-developed. She is not ill-appearing,  toxic-appearing or diaphoretic.  HENT:     Head: Normocephalic and atraumatic.     Nose: Nose normal.     Mouth/Throat:     Mouth: Mucous membranes are moist.     Pharynx: Oropharynx is clear.  Eyes:     Extraocular Movements: Extraocular movements intact.     Conjunctiva/sclera: Conjunctivae normal.     Pupils: Pupils are equal, round, and reactive to light.  Neck:      Comments: TTP in circled area.  Full range of motion of cervical spine.  No step-off, overlying skin change. Cardiovascular:     Rate and Rhythm: Normal rate and regular rhythm.  Pulmonary:     Effort: Pulmonary effort is normal.     Breath sounds: Normal breath sounds. No wheezing.  Abdominal:     General: Abdomen is flat. Bowel sounds are normal.     Palpations: Abdomen is soft.     Tenderness: There is no abdominal tenderness.  Musculoskeletal:     Cervical back: Normal range of motion and neck supple. No tenderness.  Skin:    General: Skin is warm and dry.     Capillary Refill: Capillary refill takes less than 2 seconds.  Neurological:     General: No focal deficit present.     Mental Status: She is alert and oriented to person, place, and time.     GCS: GCS eye subscore is 4. GCS verbal subscore is 5. GCS motor subscore is 6.     Cranial Nerves: Cranial nerves 2-12 are intact. No cranial nerve deficit.     Sensory: Sensation is intact. No sensory deficit.     Motor: Motor function is intact. No weakness.     Coordination: Coordination is intact. Heel to [facility] Test normal.     Comments: No focal neurodeficit on examination     ED Results / Procedures / Treatments   Labs (all labs ordered are listed, but only abnormal results are displayed) Labs Reviewed - No data to display  EKG None  Radiology CT Head Wo Contrast  Result Date: 06/08/2023 CLINICAL DATA:  Head trauma, moderate-severe; Neck trauma, intoxicated or obtunded (Age >= 16y) EXAM: CT HEAD WITHOUT CONTRAST CT CERVICAL SPINE WITHOUT  CONTRAST TECHNIQUE: Multidetector CT imaging of the head and cervical spine was performed following the standard protocol without intravenous contrast. Multiplanar CT image reconstructions of the cervical spine were also generated. RADIATION DOSE REDUCTION: This exam was performed according to the departmental dose-optimization program which includes automated exposure control, adjustment of the mA and/or kV according to patient size and/or use of iterative reconstruction technique. COMPARISON:  None Available. FINDINGS: CT HEAD FINDINGS Brain: No evidence of acute infarction, hemorrhage, hydrocephalus, extra-axial collection or mass lesion/mass effect. Vascular:  No hyperdense vessel or unexpected calcification. Skull: Normal. Negative for fracture or focal lesion. Sinuses/Orbits: No middle ear or mastoid effusion. Paranasal sinuses are clear. Bilateral lens replacement. Orbits are otherwise unremarkable. Other: None. CT CERVICAL SPINE FINDINGS Alignment: Straightening of the normal cervical lordosis. Grade 1 anterolisthesis of C3 on C4. Trace retrolisthesis of C5 on C6. Skull base and vertebrae: No acute fracture. No primary bone lesion or focal pathologic process. Soft tissues and spinal canal: No prevertebral fluid or swelling. No visible canal hematoma. Disc levels:  No evidence of high-grade spinal canal stenosis. Upper chest: Likely right apical pleural-parenchymal scarring Other: None IMPRESSION: 1. No acute intracranial abnormality. 2. No acute fracture or traumatic subluxation of the cervical spine. Electronically Signed   By: Lorenza Cambridge M.D.   On: 06/08/2023 14:46   CT Cervical Spine Wo Contrast  Result Date: 06/08/2023 CLINICAL DATA:  Head trauma, moderate-severe; Neck trauma, intoxicated or obtunded (Age >= 16y) EXAM: CT HEAD WITHOUT CONTRAST CT CERVICAL SPINE WITHOUT CONTRAST TECHNIQUE: Multidetector CT imaging of the head and cervical spine was performed following the standard protocol without  intravenous contrast. Multiplanar CT image reconstructions of the cervical spine were also generated. RADIATION DOSE REDUCTION: This exam was performed according to the departmental dose-optimization program which includes automated exposure control, adjustment of the mA and/or kV according to patient size and/or use of iterative reconstruction technique. COMPARISON:  None Available. FINDINGS: CT HEAD FINDINGS Brain: No evidence of acute infarction, hemorrhage, hydrocephalus, extra-axial collection or mass lesion/mass effect. Vascular: No hyperdense vessel or unexpected calcification. Skull: Normal. Negative for fracture or focal lesion. Sinuses/Orbits: No middle ear or mastoid effusion. Paranasal sinuses are clear. Bilateral lens replacement. Orbits are otherwise unremarkable. Other: None. CT CERVICAL SPINE FINDINGS Alignment: Straightening of the normal cervical lordosis. Grade 1 anterolisthesis of C3 on C4. Trace retrolisthesis of C5 on C6. Skull base and vertebrae: No acute fracture. No primary bone lesion or focal pathologic process. Soft tissues and spinal canal: No prevertebral fluid or swelling. No visible canal hematoma. Disc levels:  No evidence of high-grade spinal canal stenosis. Upper chest: Likely right apical pleural-parenchymal scarring Other: None IMPRESSION: 1. No acute intracranial abnormality. 2. No acute fracture or traumatic subluxation of the cervical spine. Electronically Signed   By: Lorenza Cambridge M.D.   On: 06/08/2023 14:46    Procedures Procedures   Medications Ordered in ED Medications  lidocaine (LIDODERM) 5 % 1 patch (has no administration in time range)    ED Course/ Medical Decision Making/ A&P  Medical Decision Making Amount and/or Complexity of Data Reviewed Radiology: ordered.   89 year old female presents to ED for evaluation.  Please see HPI for further details.  On examination the patient is afebrile and nontachycardic.  Her lung sounds are clear bilaterally  and she is not epoxy.  Her abdomen is soft and compressible throughout.  Neurological examination at baseline without focal neurodeficits.  She has tenderness to central cervical spine however full range of motion of cervical spine, no step-off, no overlying skin change.  She is overall nontoxic in appearance.  Patient CT scan of head without contrast and CT cervical spine which were ordered in triage are unremarkable.  No intracranial pathology noted.  Patient provided lidocaine patch here.  The patient will be prescribed muscle relaxers and advised to take Tylenol or ibuprofen at home.  She was advised to follow-up with her PCP this week for reevaluation.  Return precautions provided and she voiced understanding.  She had all of her  questions answered to her satisfaction.  She is stable to discharge at this time.   Final Clinical Impression(s) / ED Diagnoses Final diagnoses:  Fall, initial encounter    Rx / DC Orders ED Discharge Orders          Ordered    tiZANidine (ZANAFLEX) 4 MG tablet  Every 6 hours PRN        06/08/23 1533              Al Decant, PA-C 06/08/23 1533    Laurence Spates, MD 06/09/23 [phone]

## 2023-06-08 NOTE — ED Notes (Signed)
This RN reviewed discharge instructions with patient. She verbalized understanding and denied any further questions. PT well appearing upon discharge and reports tolerable pain. Pt ambulated with stable gait to exit. Pt endorses ride home.  

## 2023-06-08 NOTE — ED Provider Notes (Signed)
MSE note.  Patient fell a number days ago and states that she continues to have a headache and neck pain.  She hit her head against the wall but did not have loss of consciousness.  Patient takes aspirin.  Physical exam vital signs normal.  Patient has tenderness to the top of her head.  She is oriented x 4.  Patient also tender posterior neck.  Lungs clear heart regular rate and rhythm.  CT head and cervical spine ordered   Bethann Berkshire, MD 06/08/23 1202

## 2023-06-15 ENCOUNTER — Encounter: Payer: Self-pay | Admitting: Internal Medicine

## 2023-06-15 ENCOUNTER — Ambulatory Visit: Payer: Medicare Other | Admitting: Internal Medicine

## 2023-06-15 VITALS — BP 180/100 | HR 74 | Temp 98.1°F | Ht 65.5 in | Wt 132.0 lb

## 2023-06-15 DIAGNOSIS — M436 Torticollis: Secondary | ICD-10-CM | POA: Insufficient documentation

## 2023-06-15 NOTE — Progress Notes (Signed)
   Subjective:   Patient ID: Nancy Neal, female    DOB: [birth year], 89 y.o.   MRN: [id number]  HPI The patient is an 89 YO female coming in for ER follow up (in for fall). She is using tizanidine in the evening and this is helping her sleep. It is causing hives. She is using tylenol and ibuprofen during the day.  Review of Systems  Constitutional: Negative.   HENT: Negative.    Eyes: Negative.   Respiratory:  Negative for cough, chest tightness and shortness of breath.   Cardiovascular:  Negative for chest pain, palpitations and leg swelling.  Gastrointestinal:  Negative for abdominal distention, abdominal pain, constipation, diarrhea, nausea and vomiting.  Musculoskeletal:  Positive for neck pain and neck stiffness.  Skin: Negative.   Neurological: Negative.   Psychiatric/Behavioral: Negative.      Objective:  Physical Exam Constitutional:      Appearance: She is well-developed.  HENT:     Head: Normocephalic and atraumatic.  Cardiovascular:     Rate and Rhythm: Normal rate and regular rhythm.  Pulmonary:     Effort: Pulmonary effort is normal. No respiratory distress.     Breath sounds: Normal breath sounds. No wheezing or rales.  Abdominal:     General: Bowel sounds are normal. There is no distension.     Palpations: Abdomen is soft.     Tenderness: There is no abdominal tenderness. There is no rebound.  Musculoskeletal:     Cervical back: Normal range of motion.     Comments: Neck stiffness and pain with movement, no pain to palpation over spine  Skin:    General: Skin is warm and dry.  Neurological:     Mental Status: She is alert and oriented to person, place, and time.     Coordination: Coordination normal.     Vitals:   06/15/23 1055 06/15/23 1100  BP: (!) 180/100 (!) 180/100  Pulse: 74   Temp: 98.1 F (36.7 C)   TempSrc: Oral   SpO2: 96%   Weight: 132 lb (59.9 kg)   Height: 5' 5.5" (1.664 m)     Assessment & Plan:  Visit time 20 minutes in face to  face communication with patient and coordination of care, additional 10 minutes spent in record review, coordination or care, ordering tests, communicating/referring to other healthcare professionals, documenting in medical records all on the same day of the visit for total time 30 minutes spent on the visit.

## 2023-06-15 NOTE — Assessment & Plan Note (Signed)
CT cervical spine clear of fracture at ER. She is using tizanidine 4 mg at bedtime and this is helping her sleep. She is overall improving with less pain and stiffness. Using tylenol and ibuprofen during daytime. No red flag signs. Informed about symptoms to monitor for.

## 2023-07-08 ENCOUNTER — Ambulatory Visit (INDEPENDENT_AMBULATORY_CARE_PROVIDER_SITE_OTHER): Payer: Medicare Other | Admitting: Podiatry

## 2023-07-08 ENCOUNTER — Encounter: Payer: Self-pay | Admitting: Podiatry

## 2023-07-08 DIAGNOSIS — M79676 Pain in unspecified toe(s): Secondary | ICD-10-CM | POA: Diagnosis not present

## 2023-07-08 DIAGNOSIS — B351 Tinea unguium: Secondary | ICD-10-CM | POA: Diagnosis not present

## 2023-07-13 NOTE — Progress Notes (Signed)
Subjective:  Patient ID: Nancy Neal, female    DOB: 06-Jan-1932,  MRN: [id number]  89 y.o. female presents to clinic with  thick, elongated toenails right foot which are tender when wearing enclosed shoe gear. No chief complaint on file.  New problem(s): None   PCP is Myrlene Broker, MD.  Allergies  Allergen Reactions   Codeine Sulfate Nausea And Vomiting   Meclizine Other (See Comments)    Other reaction(s): Other (See Comments) glaucoma glaucoma   Acetaminophen Nausea Only    Pain Medicines make her sick   Morphine    Other     "All painkillers" cause nausea   Tramadol     Review of Systems: Negative except as noted in the HPI.   Objective:  Nancy Neal is a pleasant 89 y.o. female WD, WN in NAD.Marland Kitchen  Vascular Examination: Vascular status intact b/l with palpable pedal pulses. CFT immediate b/l. No edema. No pain with calf compression b/l. Skin temperature gradient WNL b/l. No cyanosis or clubbing noted b/l LE.  Neurological Examination: Sensation grossly intact b/l with 10 gram monofilament. Vibratory sensation intact b/l.   Dermatological Examination: Pedal skin with normal turgor, texture and tone b/l. Toenails 1-5 right foot thick, discolored, elongated with subungual debris and pain on dorsal palpation. No hyperkeratotic lesions noted b/l. No open wounds b/l LE. No interdigital macerations noted b/l LE.  Musculoskeletal Examination: Muscle strength 5/5 to b/l LE. HAV with bunion deformity noted b/l LE.  Radiographs: None  Assessment:   1. Pain due to onychomycosis of toenail    Plan:  -Consent given for treatment as described below: -Examined patient. -Patient to continue soft, supportive shoe gear daily. -Mycotic toenails 1-5 right foot were debrided in length and girth with sterile nail nippers and dremel without iatrogenic bleeding. -Patient/POA to call should there be question/concern in the interim.  Return in about 3 months (around  10/07/2023).  Nancy Neal, DPM

## 2023-10-07 ENCOUNTER — Ambulatory Visit (INDEPENDENT_AMBULATORY_CARE_PROVIDER_SITE_OTHER): Payer: Medicare Other | Admitting: Podiatry

## 2023-10-07 ENCOUNTER — Encounter: Payer: Self-pay | Admitting: Podiatry

## 2023-10-07 DIAGNOSIS — M79676 Pain in unspecified toe(s): Secondary | ICD-10-CM | POA: Diagnosis not present

## 2023-10-07 DIAGNOSIS — B351 Tinea unguium: Secondary | ICD-10-CM | POA: Diagnosis not present

## 2023-10-07 NOTE — Progress Notes (Signed)
  Subjective:  Patient ID: Nancy Neal, female    DOB: December 13, 1931,   MRN: [id number]  No chief complaint on file.   89 y.o. female presents for concern of thickened elongated and painful nails that are difficult to trim. Requesting to have them trimmed today.  PCP:  Myrlene Broker, MD    . Denies any other pedal complaints. Denies n/v/f/c.   Past Medical History:  Diagnosis Date   ALLERGIC RHINITIS 06/02/2007   Arthritis    "maybe in my right knee" (12/19/2015)   Coronary artery disease 11/2015   a. 11/2015: Inferior STEMI w/ Promus DES to RCA.     Diverticulosis of colon 2003   by 2003 and 2008 colonoscopy.    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 CFT <3 seconds. Normal hair growth on digits. No edema.  Skin. No lacerations or abrasions bilateral feet. Nails 1-5 bilateral are thickened elongated and with subungual debris.  Musculoskeletal: MMT 5/5 bilateral lower extremities in DF, PF, Inversion and Eversion. Deceased ROM in DF of ankle joint.  Neurological: Sensation intact to light touch.   Assessment:   1. Pain due to onychomycosis of toenail      Plan:  Patient was evaluated and treated and all questions answered. -Mechanically debrided all nails 1-5 bilateral using sterile nail nipper and filed with dremel without incident  -Answered all patient questions -Patient to return  in 3 months for at risk foot care -Patient advised to call the office if any problems or questions arise in the meantime.   Louann Sjogren, DPM

## 2023-11-02 ENCOUNTER — Ambulatory Visit (INDEPENDENT_AMBULATORY_CARE_PROVIDER_SITE_OTHER): Payer: Medicare Other

## 2023-11-02 VITALS — BP 138/82 | HR 85 | Ht 64.0 in | Wt 132.2 lb

## 2023-11-02 DIAGNOSIS — Z0000 Encounter for general adult medical examination without abnormal findings: Secondary | ICD-10-CM | POA: Diagnosis not present

## 2023-11-02 DIAGNOSIS — Z780 Asymptomatic menopausal state: Secondary | ICD-10-CM | POA: Diagnosis not present

## 2023-11-02 NOTE — Addendum Note (Signed)
 Addended by: Wyvonne Lenz on: 11/02/2023 04:05 PM   Modules accepted: Orders

## 2023-11-02 NOTE — Progress Notes (Addendum)
 Subjective:   Nancy Neal is a 89 y.o. female who presents for Medicare Annual (Subsequent) preventive examination.  Visit Complete: In person   Cardiac Risk Factors include: advanced age (>64men, >67 women);dyslipidemia;Other (see comment), Risk factor comments: CAD, Osteoporosis     Objective:    Today's Vitals   11/02/23 1143  BP: 138/82  Pulse: 85  SpO2: 95%  Weight: 132 lb 3.2 oz (60 kg)  Height: 5' 4 (1.626 m)   Body mass index is 22.69 kg/m.     11/02/2023   11:50 AM 06/08/2023   11:42 AM 06/17/2022    4:52 AM 10/18/2021    9:58 AM 06/26/2021   12:18 AM 03/30/2019   10:31 AM 02/28/2016    5:26 AM  Advanced Directives  Does Patient Have a Medical Advance Directive? Yes Yes No Yes No Yes No  Type of Estate Agent of [place];Living will Healthcare Power of [place];Living will  Living will  Healthcare Power of [place];Living will   Does patient want to make changes to medical advance directive?      No - Patient declined   Copy of Healthcare Power of Attorney in Chart? No - copy requested No - copy requested, Physician notified    No - copy requested   Would patient like information on creating a medical advance directive?  No - Patient declined No - Patient declined No - Patient declined   No - patient declined information    Current Medications (verified) Outpatient Encounter Medications as of 11/02/2023  Medication Sig   acetaminophen  (TYLENOL ) 325 MG tablet Take 2 tablets (650 mg total) by mouth every 4 (four) hours as needed for headache or mild pain.   Ascorbic Acid (VITAMIN C ER PO) Take 1 tablet by mouth daily.   aspirin  81 MG chewable tablet Chew 1 tablet (81 mg total) by mouth daily.   atorvastatin  (LIPITOR ) 10 MG tablet TAKE 1 TABLET(10 MG) BY MOUTH DAILY Strength: 10 mg   Bioflavonoid Products (ESTER-C PO) Take by mouth.   carboxymethylcellulose (REFRESH PLUS) 0.5 % SOLN Place 1 drop into both eyes 3 (three) times daily as needed  (dry eyes).    cetirizine (ZYRTEC) 10 MG chewable tablet Chew 10 mg by mouth daily.   Cholecalciferol (VITAMIN D3) 250 MCG (10000 UT) TABS Take 1 capsule by mouth daily.   Collagenase POWD by Does not apply route.   MAGNESIUM PO Take 1 tablet by mouth daily.   Multiple Vitamin (MULTIVITAMIN WITH MINERALS) TABS tablet Take 1 tablet by mouth daily.   nitroGLYCERIN  (NITROSTAT ) 0.4 MG SL tablet Place 0.4 mg under the tongue as needed for chest pain.   NON FORMULARY LADDER WEIGHT PROTEIN   Probiotic Product (CULTURELLE PROBIOTICS PO) Take by mouth.   tiZANidine  (ZANAFLEX ) 4 MG tablet Take 1 tablet (4 mg total) by mouth every 6 (six) hours as needed for muscle spasms.   vitamin B-12 (CYANOCOBALAMIN ) 100 MCG tablet Take 100 mcg by mouth daily.   vitamin E 1000 UNIT capsule Take 1,000 Units by mouth daily.   No facility-administered encounter medications on file as of 11/02/2023.    Allergies (verified) Codeine sulfate, Meclizine, Acetaminophen , Morphine , Other, and Tramadol   History: Past Medical History:  Diagnosis Date   ALLERGIC RHINITIS 06/02/2007   Arthritis    maybe in my right knee (12/19/2015)   Coronary artery disease 11/2015   a. 11/2015: Inferior STEMI w/ Promus DES to RCA.     Diverticulosis of colon 2003  by 2003 and 2008 colonoscopy.    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   History of blood transfusion 1971   when I had hysterectomy   Myocardial infarction (HCC) 11/2015   OSTEOPENIA 06/02/2007   VAGINITIS, ATROPHIC, POSTMENOPAUSAL 12/06/2008   VERTIGO 02/16/2009   Past Surgical History:  Procedure Laterality Date   APPENDECTOMY  1971   CARDIAC CATHETERIZATION N/A 12/15/2015   Procedure: Left Heart Cath and Coronary Angiography;  Surgeon: Victory LELON Sharps, MD;  Location: [facility] INVASIVE CV LAB;  Service: Cardiovascular;  Laterality: N/A;   CARDIAC CATHETERIZATION N/A 12/15/2015   Procedure: Coronary Stent Intervention;  Surgeon: Victory LELON Sharps,  MD;  Location: [facility] INVASIVE CV LAB;  Service: Cardiovascular;  Laterality: N/A;   COLONOSCOPY N/A 12/20/2015   Procedure: COLONOSCOPY;  Surgeon: Norleen LOISE Kiang, MD;  Location: [facility] ENDOSCOPY;  Service: Endoscopy;  Laterality: N/A;   EYE SURGERY Bilateral    laser tx for glaucoma   HEMORRHOID SURGERY  ~ 1968   SHOULDER ARTHROSCOPY W/ ROTATOR CUFF REPAIR Right 11/2014   TOTAL ABDOMINAL HYSTERECTOMY  1971   Family History  Problem Relation Age of Onset   Suicidality Mother    Heart disease Father    Arthritis Brother    Breast cancer Neg Hx    Colon cancer Neg Hx    Stomach cancer Neg Hx    Esophageal cancer Neg Hx    Social History   Socioeconomic History   Marital status: Widowed    Spouse name: Not on file   Number of children: 2   Years of education: Not on file   Highest education level: Not on file  Occupational History   Occupation: RETIRED  Tobacco Use   Smoking status: Never    Passive exposure: Past   Smokeless tobacco: Never  Vaping Use   Vaping status: Never Used  Substance and Sexual Activity   Alcohol  use: Not Currently    Comment: sporadically   Drug use: No   Sexual activity: Not on file  Other Topics Concern   Not on file  Social History Narrative   Lives alone-2025  Lost her son in March 2024   Social Drivers of Health   Financial Resource Strain: Low Risk  (11/02/2023)   Overall Financial Resource Strain (CARDIA)    Difficulty of Paying Living Expenses: Not hard at all  Food Insecurity: No Food Insecurity (11/02/2023)   Hunger Vital Sign    Worried About Running Out of Food in the Last Year: Never true    Ran Out of Food in the Last Year: Never true  Transportation Needs: No Transportation Needs (11/02/2023)   PRAPARE - Administrator, Civil Service (Medical): No    Lack of Transportation (Non-Medical): No  Physical Activity: Sufficiently Active (11/02/2023)   Exercise Vital Sign    Days of Exercise per Week: 6 days    Minutes of Exercise  per Session: 60 min  Stress: No Stress Concern Present (11/02/2023)   [employer] of Occupational Health - Occupational Stress Questionnaire    Feeling of Stress : Not at all  Social Connections: Socially Isolated (11/02/2023)   Social Connection and Isolation Panel [NHANES]    Frequency of Communication with Friends and Family: More than three times a week    Frequency of Social Gatherings with Friends and Family: Three times a week    Attends Religious Services: Never    Active Member of Clubs or Organizations:  No    Attends Banker Meetings: Never    Marital Status: Widowed    Tobacco Counseling Counseling given: Not Answered   Clinical Intake:  Pre-visit preparation completed: Yes  Pain : No/denies pain     BMI - recorded: 22.69 Nutritional Status: BMI of 19-24  Normal Nutritional Risks: None Diabetes: No  How often do you need to have someone help you when you read instructions, pamphlets, or other written materials from your doctor or pharmacy?: 1 - Never  Interpreter Needed?: No  Information entered by :: Maelynn Moroney, RMA   Activities of Daily Living    11/02/2023   11:46 AM  In your present state of health, do you have any difficulty performing the following activities:  Hearing? 1  Comment wears hearing aides  Vision? 0  Difficulty concentrating or making decisions? 0  Walking or climbing stairs? 0  Dressing or bathing? 0  Doing errands, shopping? 0  Preparing Food and eating ? N  Using the Toilet? N  In the past six months, have you accidently leaked urine? Y  Do you have problems with loss of bowel control? N  Managing your Medications? N  Managing your Finances? N  Housekeeping or managing your Housekeeping? N    Patient Care Team: Rollene Almarie LABOR, MD as PCP - General (Internal Medicine) Jeffrie Oneil BROCKS, MD as PCP - Cardiology (Cardiology) Leila Bound, OD (Optometry) [facility], Donell Cardinal, MD as Attending  Physician (Endocrinology)  Indicate any recent Medical Services you may have received from other than [facility] providers in the past year (date may be approximate).     Assessment:   This is a routine wellness examination for Sandra.  Hearing/Vision screen Hearing Screening - Comments:: Wears hearing aides Vision Screening - Comments:: Denies vision issues.    Goals Addressed               This Visit's Progress     Maintain current health  (pt-stated)   On track     Depression Screen    11/02/2023   11:58 AM 06/15/2023   11:01 AM 10/30/2022    9:28 AM 08/05/2022    8:09 AM 10/07/2021   11:19 AM 10/07/2021   10:32 AM 07/04/2021   11:30 AM  PHQ 2/9 Scores  PHQ - 2 Score 1 0 0 0 0 0 0  PHQ- 9 Score 1 0 0 0       Fall Risk    11/02/2023   11:51 AM 06/15/2023   11:01 AM 10/30/2022    9:28 AM 08/05/2022    8:08 AM 03/14/2022   11:19 AM  Fall Risk   Falls in the past year? 1 1 0 0 0  Number falls in past yr: 0 0 0 0 0  Injury with Fall? 0 1 0 0 0  Risk for fall due to :    No Fall Risks   Follow up Falls evaluation completed;Falls prevention discussed Falls evaluation completed Falls evaluation completed Falls evaluation completed     MEDICARE RISK AT HOME: Medicare Risk at Home Any stairs in or around the home?: No Home free of loose throw rugs in walkways, pet beds, electrical cords, etc?: Yes Adequate lighting in your home to reduce risk of falls?: Yes Life alert?: No Use of a cane, walker or w/c?: No Grab bars in the bathroom?: No Shower chair or bench in shower?: No Elevated toilet seat or a handicapped toilet?: No  TIMED UP AND GO:  Was the test performed?  Yes  Length of time to ambulate 10 feet: 15 sec Gait steady and fast without use of assistive device    Cognitive Function:    10/07/2021   11:32 AM  MMSE - Mini Mental State Exam  Orientation to time 5  Orientation to Place 5  Registration 3  Attention/ Calculation 5  Recall 2  Language- name 2  objects 2  Language- repeat 1  Language- follow 3 step command 3  Language- read & follow direction 1  Write a sentence 1  Copy design 1  Total score 29        11/02/2023   11:54 AM  6CIT Screen  What Year? 0 points  What month? 0 points  What time? 0 points  Count back from 20 0 points  Months in reverse 0 points  Repeat phrase 0 points  Total Score 0 points    Immunizations Immunization History  Administered Date(s) Administered   Fluad Quad(high Dose 65+) 07/04/2020, 07/04/2021   Influenza Split 07/16/2011, 07/12/2012   Influenza Whole 08/03/2007, 07/20/2008, 08/05/2010, 07/15/2013   Influenza, High Dose Seasonal PF 08/04/2016, 07/24/2018, 08/02/2019   Influenza,inj,Quad PF,6+ Mos 08/02/2014   Influenza-Unspecified 08/17/2015, 07/20/2018, 07/03/2022   Moderna Sars-Covid-2 Vaccination 10/21/2019, 11/28/2019, 08/27/2020, 03/16/2021   Pneumococcal Conjugate-13 01/04/2014   Pneumococcal Polysaccharide-23 12/18/2011   Tdap 10/01/2018   Tetanus 01/04/2014   Zoster Recombinant(Shingrix) 02/16/2017, 04/20/2017   Zoster, Live 07/25/2008    TDAP status: Up to date  Flu Vaccine status: Up to date  Pneumococcal vaccine status: Up to date  Covid-19 vaccine status: Declined, Education has been provided regarding the importance of this vaccine but patient still declined. Advised may receive this vaccine at local pharmacy or Health Dept.or vaccine clinic. Aware to provide a copy of the vaccination record if obtained from local pharmacy or Health Dept. Verbalized acceptance and understanding.  Qualifies for Shingles Vaccine? Yes   Zostavax completed Yes   Shingrix Completed?: Yes  Screening Tests Health Maintenance  Topic Date Due   COVID-19 Vaccine (5 - 2024-25 season) 10/19/2024 (Originally 06/21/2023)   Medicare Annual Wellness (AWV)  11/01/2024   DTaP/Tdap/Td (2 - Td or Tdap) 10/01/2028   Pneumonia Vaccine 55+ Years old  Completed   INFLUENZA VACCINE  Completed   DEXA  SCAN  Completed   Zoster Vaccines- Shingrix  Completed   HPV VACCINES  Aged Out   Colonoscopy  Discontinued    Health Maintenance  There are no preventive care reminders to display for this patient.   Colorectal cancer screening: No longer required.   Mammogram status: Completed 12/18/2022. Repeat every year  Bone Density status: Completed 11/22/2021. Results reflect: Bone density results: OSTEOPOROSIS. Repeat every 2 years.  Lung Cancer Screening: (Low Dose CT Chest recommended if Age 32-80 years, 20 pack-year currently smoking OR have quit w/in 15years.) does not qualify.   Lung Cancer Screening Referral: N/A  Additional Screening:  Hepatitis C Screening: does not qualify;   Vision Screening: Recommended annual ophthalmology exams for early detection of glaucoma and other disorders of the eye. Is the patient up to date with their annual eye exam?  Yes  Who is the provider or what is the name of the office in which the patient attends annual eye exams? Dr. Leila If pt is not established with a provider, would they like to be referred to a provider to establish care? No .   Dental Screening: Recommended annual dental exams for proper oral hygiene  Community  Resource Referral / Chronic Care Management: CRR required this visit?  No   CCM required this visit?  No     Plan:     I have personally reviewed and noted the following in the patient's chart:   Medical and social history Use of alcohol , tobacco or illicit drugs  Current medications and supplements including opioid prescriptions. Patient is not currently taking opioid prescriptions. Functional ability and status Nutritional status Physical activity Advanced directives List of other physicians Hospitalizations, surgeries, and ER visits in previous 12 months Vitals Screenings to include cognitive, depression, and falls Referrals and appointments  In addition, I have reviewed and discussed with patient certain  preventive protocols, quality metrics, and best practice recommendations. A written personalized care plan for preventive services as well as general preventive health recommendations were provided to patient.     Gorman Safi L Terryl Niziolek, CMA   11/02/2023   After Visit Summary: (MyChart) Due to this being a telephonic visit, the after visit summary with patients personalized plan was offered to patient via MyChart   Nurse Notes: Patient is requesting a DEXA today and order has been placed.  Patient is up to date on her health maintenance. She has no concerns to address today.

## 2023-11-02 NOTE — Patient Instructions (Addendum)
 Nancy Neal , Thank you for taking time to come for your Medicare Wellness Visit. I appreciate your ongoing commitment to your health goals. Please review the following plan we discussed and let me know if I can assist you in the future.   Referrals/Orders/Follow-Ups/Clinician Recommendations: Keep up the good work and it was nice to meet you today.  You have an order for:   [x]   Bone Density     Please call for appointment:  [facility] [address] [place], [place] [address] [phone]     Make sure to wear two-piece clothing.  No lotions, powders, or deodorants the day of the appointment. Make sure to bring picture ID and insurance card.  Bring list of medications you are currently taking including any supplements.   Schedule your [place] screening mammogram through MyChart!   Log into your MyChart account.  Go to 'Visit' (or 'Appointments' if on mobile App) --> Schedule an Appointment  Under 'Select a Reason for Visit' choose the Mammogram Screening option.  Complete the pre-visit questions and select the time and place that best fits your schedule.    This is a list of the screening recommended for you and due dates:  Health Maintenance  Topic Date Due   COVID-19 Vaccine (5 - 2024-25 season) 10/19/2024*   Medicare Annual Wellness Visit  11/01/2024   DTaP/Tdap/Td vaccine (2 - Td or Tdap) 10/01/2028   Pneumonia Vaccine  Completed   Flu Shot  Completed   DEXA scan (bone density measurement)  Completed   Zoster (Shingles) Vaccine  Completed   HPV Vaccine  Aged Out   Colon Cancer Screening  Discontinued  *Topic was postponed. The date shown is not the original due date.    Advanced directives: (Copy Requested) Please bring a copy of your health care power of attorney and living will to the office to be added to your chart at your convenience.  Next Medicare Annual Wellness Visit scheduled for next year: Yes

## 2023-11-12 DIAGNOSIS — M1711 Unilateral primary osteoarthritis, right knee: Secondary | ICD-10-CM | POA: Diagnosis not present

## 2023-11-27 ENCOUNTER — Other Ambulatory Visit: Payer: Self-pay | Admitting: Internal Medicine

## 2023-11-27 DIAGNOSIS — Z0000 Encounter for general adult medical examination without abnormal findings: Secondary | ICD-10-CM

## 2023-12-18 ENCOUNTER — Ambulatory Visit
Admission: RE | Admit: 2023-12-18 | Discharge: 2023-12-18 | Disposition: A | Discharge status: Home / Self Care | Payer: Medicare Other | Source: Ambulatory Visit | Attending: Internal Medicine | Admitting: Internal Medicine

## 2023-12-18 DIAGNOSIS — Z0000 Encounter for general adult medical examination without abnormal findings: Secondary | ICD-10-CM

## 2023-12-18 DIAGNOSIS — Z1231 Encounter for screening mammogram for malignant neoplasm of breast: Secondary | ICD-10-CM | POA: Diagnosis not present

## 2023-12-22 ENCOUNTER — Encounter: Payer: Self-pay | Admitting: Internal Medicine

## 2023-12-23 LAB — HM MAMMOGRAPHY

## 2023-12-25 ENCOUNTER — Encounter: Payer: Self-pay | Admitting: Internal Medicine

## 2024-01-01 ENCOUNTER — Other Ambulatory Visit: Payer: Self-pay

## 2024-01-01 MED ORDER — ATORVASTATIN CALCIUM 10 MG PO TABS: ORAL_TABLET | ORAL | 0 refills | Status: DC

## 2024-01-05 ENCOUNTER — Ambulatory Visit (INDEPENDENT_AMBULATORY_CARE_PROVIDER_SITE_OTHER): Payer: Medicare Other | Admitting: Podiatry

## 2024-01-05 ENCOUNTER — Encounter: Payer: Self-pay | Admitting: Podiatry

## 2024-01-05 VITALS — Ht 64.0 in | Wt 132.2 lb

## 2024-01-05 DIAGNOSIS — M79676 Pain in unspecified toe(s): Secondary | ICD-10-CM | POA: Diagnosis not present

## 2024-01-05 DIAGNOSIS — B351 Tinea unguium: Secondary | ICD-10-CM

## 2024-01-10 NOTE — Progress Notes (Signed)
  Subjective:  Patient ID: Nancy Neal, female    DOB: 1932-07-02,  MRN: [id number]  89 y.o. female presents painful, elongated thickened toenails x 10 which are symptomatic when wearing enclosed shoe gear. This interferes with his/her daily activities. She would like to discuss treatment for mycotic toenails. Chief Complaint  Patient presents with   Nail Problem    Pt is here for [facility] PCP is Dr Okey Dupre and LOV was in August.    New problem(s): None   PCP is Myrlene Broker, MD.  Allergies  Allergen Reactions   Codeine Sulfate Nausea And Vomiting   Meclizine Other (See Comments)    Other reaction(s): Other (See Comments) glaucoma glaucoma   Acetaminophen Nausea Only    Pain Medicines make her sick   Morphine    Other     "All painkillers" cause nausea   Tramadol     Review of Systems: Negative except as noted in the HPI.   Objective:  Nancy Neal is a pleasant 89 y.o. female WD, WN in NAD. AAO x 3.  Vascular Examination: Vascular status intact b/l with palpable pedal pulses. CFT immediate b/l. Pedal hair present. No edema. No pain with calf compression b/l. Skin temperature gradient WNL b/l. No varicosities noted. No cyanosis or clubbing noted.  Neurological Examination: Sensation grossly intact b/l with 10 gram monofilament.  Dermatological Examination: Pedal skin with normal turgor, texture and tone b/l. No open wounds nor interdigital macerations noted. Toenails 1-5 b/l thick, discolored, elongated with subungual debris and pain on dorsal palpation. No hyperkeratotic lesions noted b/l.   Musculoskeletal Examination: Muscle strength 5/5 to b/l LE.  No pain, crepitus noted b/l. HAV with bunion deformity noted b/l LE.  Radiographs: None  Last A1c:       No data to display           Assessment:   1. Pain due to onychomycosis of toenail    Plan:  Patient was evaluated and treated. All patient's and/or POA's questions/concerns addressed on today's  visit. Toenails 1-5 debrided in length and girth without incident. We discussed treatment and she would like to start Vick's Vapor Rub in the fall since we are coming up on spring and summer. Continue soft, supportive shoe gear daily. Report any pedal injuries to medical professional. Call office if there are any questions/concerns. -Patient/POA to call should there be question/concern in the interim.  Return in about 3 months (around 04/06/2024).  Freddie Breech, DPM      [place] LOCATION: [address] [place] [address]                   Office [phone]   [facility] LOCATION: [address] [place], [place] [address] Office [phone]

## 2024-01-14 ENCOUNTER — Encounter: Payer: Self-pay | Admitting: Internal Medicine

## 2024-01-14 ENCOUNTER — Ambulatory Visit (INDEPENDENT_AMBULATORY_CARE_PROVIDER_SITE_OTHER): Admitting: Internal Medicine

## 2024-01-14 VITALS — BP 122/80 | HR 83 | Temp 98.0°F | Ht 64.0 in | Wt 137.0 lb

## 2024-01-14 DIAGNOSIS — N3000 Acute cystitis without hematuria: Secondary | ICD-10-CM

## 2024-01-14 LAB — POCT URINALYSIS DIPSTICK
Bilirubin, UA: NEGATIVE
Blood, UA: NEGATIVE
Glucose, UA: NEGATIVE
Ketones, UA: NEGATIVE
Nitrite, UA: NEGATIVE
Protein, UA: NEGATIVE
Spec Grav, UA: >=1.03 — AB (ref 1.010–1.025)
Urobilinogen, UA: NEGATIVE U/dL — AB
pH, UA: 6 (ref 5.0–8.0)

## 2024-01-14 MED ORDER — NITROFURANTOIN MONOHYD MACRO 100 MG PO CAPS: 100.0000 mg | ORAL_CAPSULE | Freq: Two times a day (BID) | ORAL | 0 refills | Status: AC

## 2024-01-14 MED ORDER — NITROFURANTOIN MONOHYD MACRO 100 MG PO CAPS: 100.0000 mg | ORAL_CAPSULE | Freq: Two times a day (BID) | ORAL | 0 refills | Status: DC

## 2024-01-14 NOTE — Progress Notes (Signed)
   Subjective:   Patient ID: Nancy Neal, female    DOB: [birth year], 89 y.o.   MRN: [id number]  Urinary Tract Infection  Pertinent negatives include no nausea or vomiting.   The patient is an 89 YO female coming in for possible UTI with burning few days.  Review of Systems  Constitutional: Negative.   HENT: Negative.    Eyes: Negative.   Respiratory:  Negative for cough, chest tightness and shortness of breath.   Cardiovascular:  Negative for chest pain, palpitations and leg swelling.  Gastrointestinal:  Negative for abdominal distention, abdominal pain, constipation, diarrhea, nausea and vomiting.  Genitourinary:  Positive for dysuria.  Musculoskeletal: Negative.   Skin: Negative.   Neurological: Negative.   Psychiatric/Behavioral: Negative.      Objective:  Physical Exam Constitutional:      Appearance: She is well-developed.  HENT:     Head: Normocephalic and atraumatic.  Cardiovascular:     Rate and Rhythm: Normal rate and regular rhythm.  Pulmonary:     Effort: Pulmonary effort is normal. No respiratory distress.     Breath sounds: Normal breath sounds. No wheezing or rales.  Abdominal:     General: Bowel sounds are normal. There is no distension.     Palpations: Abdomen is soft.     Tenderness: There is no abdominal tenderness. There is no rebound.  Musculoskeletal:     Cervical back: Normal range of motion.  Skin:    General: Skin is warm and dry.  Neurological:     Mental Status: She is alert and oriented to person, place, and time.     Coordination: Coordination normal.     Vitals:   01/14/24 1057  BP: 122/80  Pulse: 83  Temp: 98 F (36.7 C)  TempSrc: Oral  SpO2: 92%  Weight: 137 lb (62.1 kg)  Height: 5\' 4"  (1.626 m)    Assessment & Plan:

## 2024-01-14 NOTE — Patient Instructions (Signed)
We have sent in macrobid to take 1 pill twice a day for 5 days. 

## 2024-01-14 NOTE — Assessment & Plan Note (Signed)
 U/A done and consistent with infection. Given past ESBL checking urine culture. Rx macrobid and adjust as needed.

## 2024-01-15 ENCOUNTER — Telehealth: Payer: Self-pay

## 2024-01-15 LAB — URINE CULTURE: Result:: NO GROWTH

## 2024-01-15 MED ORDER — CEPHALEXIN 500 MG PO CAPS: 500.0000 mg | ORAL_CAPSULE | Freq: Two times a day (BID) | ORAL | 0 refills | Status: DC

## 2024-01-15 NOTE — Telephone Encounter (Signed)
**Note De-identified  [place] Obfuscation** Please advise 

## 2024-01-15 NOTE — Telephone Encounter (Signed)
 Copied from CRM [id number]. Topic: Clinical - Prescription Issue >> Jan 15, 2024  1:17 PM Jon Gills C wrote: Reason for CRM: Patient called in stating that the prescription  nitrofurantoin, macrocrystal-monohydrate, (MACROBID) 100 MG capsule, she has been taking is making her feel bad, it is making her feel very nauseous  and it wear off then once she has to take it again it starts again   She is COVID negative

## 2024-01-15 NOTE — Telephone Encounter (Signed)
 She can stop the medication.  Her urine culture is still pending.  Unfortunately the last culture she had from couple of years ago nitrofurantoin was the only medication that would treat it, but since we do not know what bacteria she has with this infection we can try something different until the culture comes back.  If her symptoms worsen or she is getting very sick she may need to be evaluated over the weekend.  New antibiotic sent to pharmacy.

## 2024-01-20 NOTE — Telephone Encounter (Signed)
Patient would like a call back from nurse.

## 2024-01-21 NOTE — Telephone Encounter (Signed)
 Patient is  needing a call back regarding a call from the nurse

## 2024-01-22 NOTE — Telephone Encounter (Signed)
 Pt has called in again with medication concerns and is requesting to speak to [facility].   Informed pt \\that  Okey Dupre and Eddie North are not here today.   Can you assist please?

## 2024-01-25 ENCOUNTER — Other Ambulatory Visit: Payer: Self-pay | Admitting: Internal Medicine

## 2024-01-25 ENCOUNTER — Other Ambulatory Visit: Payer: Self-pay | Admitting: Cardiology

## 2024-02-22 ENCOUNTER — Ambulatory Visit: Payer: Medicare Other | Attending: Cardiology | Admitting: Cardiology

## 2024-02-22 VITALS — BP 120/66 | HR 79 | Ht 64.0 in | Wt 130.2 lb

## 2024-02-22 DIAGNOSIS — R0602 Shortness of breath: Secondary | ICD-10-CM | POA: Diagnosis not present

## 2024-02-22 DIAGNOSIS — I25119 Atherosclerotic heart disease of native coronary artery with unspecified angina pectoris: Secondary | ICD-10-CM | POA: Insufficient documentation

## 2024-02-22 DIAGNOSIS — I77810 Thoracic aortic ectasia: Secondary | ICD-10-CM | POA: Diagnosis not present

## 2024-02-22 NOTE — Progress Notes (Signed)
 Cardiology Office Note:  .   Date:  02/22/2024  ID:  Squire Dyers, DOB Aug 28, 1932, MRN [id number] PCP: Adelia Homestead, MD  [facility] Providers Cardiologist:  Dorothye Gathers, MD     History of Present Illness: .   SHAKILYA FORGUES is a 89 y.o. female Discussed the use of AI scribe software for clinical note transcription with the patient, who gave verbal consent to proceed.  History of Present Illness FEMALE MASIS is an 89 year old female who presents with sleep disturbances and congestion after returning from [place].  She has been experiencing difficulty sleeping since returning from a trip to [place] last week. Despite using melatonin, it has been ineffective. She is curious about the duration it might take for her sleep pattern to normalize.  She developed severe allergy symptoms, including congestion, headache, itchy throat, and persistent coughing, which began on Saturday. She felt chilled and stayed in pajamas with a sweater on that day. She is concerned about whether her sleep issues are related to her allergies.  Her past medical history includes coronary artery disease with a history of an inferior infarction and stent placement in the RCA in February 2017, with a residual 60-70% mid LAD lesion and normal LV function. She has hyperlipidemia and is currently taking aspirin  81 mg and atorvastatin  10 mg daily. She recalls a previous heart attack for which she drove herself to the hospital, indicating it was mild. She mentions a prior evaluation for breathing issues, which revealed mild to moderate aortic valve regurgitation and a mildly dilated aorta at 42 mm, which is being monitored.  She has a family history of abdominal aortic aneurysm, but her recent CT of the chest and pelvis showed no signs of aneurysm.  She discusses her travel experiences, noting difficulty with long flights and adjusting to time zone changes, particularly after her recent trip to [place].     Studies  Reviewed: Aaron Aas   EKG Interpretation Date/Time:  Monday Feb 22 2024 13:27:24 EDT Ventricular Rate:  79 PR Interval:  162 QRS Duration:  72 QT Interval:  354 QTC Calculation: 405 R Axis:   -3  Text Interpretation: Normal sinus rhythm Normal ECG When compared with ECG of 17-Jun-2022 04:59, No significant change was found Confirmed by Dorothye Gathers ([id number]) on 02/22/2024 1:34:33 PM    Results RADIOLOGY CT chest and pelvis: no signs of aneurysm  DIAGNOSTIC Echocardiogram: normal left ventricular function, mild to moderate aortic valve regurgitation, aorta 42 mm, mildly dilated Risk Assessment/Calculations:            Physical Exam:   VS:  BP 120/66 (BP Location: Left Arm)   Pulse 79   Ht 5\' 4"  (1.626 m)   Wt 130 lb 3.2 oz (59.1 kg)   SpO2 97%   BMI 22.35 kg/m    Wt Readings from Last 3 Encounters:  02/22/24 130 lb 3.2 oz (59.1 kg)  01/14/24 137 lb (62.1 kg)  01/05/24 132 lb 3.2 oz (60 kg)    GEN: Well nourished, well developed in no acute distress NECK: No JVD; No carotid bruits CARDIAC: RRR, no murmurs, no rubs, no gallops RESPIRATORY:  Clear to auscultation without rales, wheezing or rhonchi  ABDOMEN: Soft, non-tender, non-distended EXTREMITIES:  No edema; No deformity   ASSESSMENT AND PLAN: .    Assessment and Plan Assessment & Plan Coronary artery disease with angina Inferior infarction with drug-eluting stents in RCA in February 2017. Residual 60-70% mid LAD lesion. No current chest  pain. Normal LV function. Continues on aspirin  81 mg daily.  Mild to moderate aortic valve regurgitation Mild to moderate aortic valve regurgitation. No significant symptoms. Normal cardiac pump function. - Order echocardiogram to monitor aortic valve regurgitation.  Mildly dilated aorta Aorta measured at 42 mm, mildly dilated. No significant symptoms. Family history of abdominal aortic aneurysm. Previous CT scan showed no aneurysm. - Order echocardiogram to monitor aortic  dilation.  Hyperlipidemia Managed with atorvastatin  10 mg daily.  Sleep disturbance Recent travel to [place] causing sleep disturbance. Difficulty adjusting sleep schedule post-travel. Melatonin previously ineffective. Adjustment may take up to two weeks. - Advise on sleep hygiene and allow time for adjustment.  Allergic rhinitis Severe allergy symptoms including congestion, headache, itchy throat, and cough. Symptoms suggestive of allergic rhinitis. - Recommend Coricidin HBP for congestion.           Signed, Dorothye Gathers, MD

## 2024-02-22 NOTE — Patient Instructions (Addendum)
 Medication Instructions:  The current medical regimen is effective;  continue present plan and medications.  *If you need a refill on your cardiac medications before your next appointment, please call your pharmacy*  Testing/Procedures: Your physician has requested that you have an echocardiogram. Echocardiography is a painless test that uses sound waves to create images of your heart. It provides your doctor with information about the size and shape of your heart and how well your heart's chambers and valves are working. This procedure takes approximately one hour. There are no restrictions for this procedure. Please do NOT wear cologne, perfume, aftershave, or lotions (deodorant is allowed). Please arrive 15 minutes prior to your appointment time.  Please note: We ask at that you not bring children with you during ultrasound (echo/ vascular) testing. Due to room size and safety concerns, children are not allowed in the ultrasound rooms during exams. Our front office staff cannot provide observation of children in our lobby area while testing is being conducted. An adult accompanying a patient to their appointment will only be allowed in the ultrasound room at the discretion of the ultrasound technician under special circumstances. We apologize for any inconvenience.  Follow-Up: At [facility], you and your health needs are our priority.  As part of our continuing mission to provide you with exceptional heart care, our providers are all part of one team.  This team includes your primary Cardiologist (physician) and Advanced Practice Providers or APPs (Physician Assistants and Nurse Practitioners) who all work together to provide you with the care you need, when you need it.  Your next appointment:   1 year(s)  Provider:   Dorothye Gathers, MD    We recommend signing up for the patient portal called "MyChart".  Sign up information is provided on this After Visit Summary.  MyChart is used to  connect with patients for Virtual Visits (Telemedicine).  Patients are able to view lab/test results, encounter notes, upcoming appointments, etc.  Non-urgent messages can be sent to your provider as well.   To learn more about what you can do with MyChart, go to [email or web address].    Coricidin HBP

## 2024-03-21 ENCOUNTER — Ambulatory Visit: Payer: Medicare Other | Admitting: Internal Medicine

## 2024-03-23 ENCOUNTER — Emergency Department (HOSPITAL_BASED_OUTPATIENT_CLINIC_OR_DEPARTMENT_OTHER)
Admission: EM | Admit: 2024-03-23 | Discharge: 2024-03-23 | Disposition: A | Discharge status: Home / Self Care | Attending: Emergency Medicine | Admitting: Emergency Medicine

## 2024-03-23 ENCOUNTER — Emergency Department (HOSPITAL_BASED_OUTPATIENT_CLINIC_OR_DEPARTMENT_OTHER)

## 2024-03-23 ENCOUNTER — Encounter (HOSPITAL_BASED_OUTPATIENT_CLINIC_OR_DEPARTMENT_OTHER): Payer: Self-pay

## 2024-03-23 ENCOUNTER — Other Ambulatory Visit: Payer: Self-pay

## 2024-03-23 DIAGNOSIS — S76811A Strain of other specified muscles, fascia and tendons at thigh level, right thigh, initial encounter: Secondary | ICD-10-CM | POA: Insufficient documentation

## 2024-03-23 DIAGNOSIS — Z7982 Long term (current) use of aspirin: Secondary | ICD-10-CM | POA: Diagnosis not present

## 2024-03-23 DIAGNOSIS — M1711 Unilateral primary osteoarthritis, right knee: Secondary | ICD-10-CM | POA: Diagnosis not present

## 2024-03-23 DIAGNOSIS — S79921A Unspecified injury of right thigh, initial encounter: Secondary | ICD-10-CM | POA: Diagnosis present

## 2024-03-23 DIAGNOSIS — S76211A Strain of adductor muscle, fascia and tendon of right thigh, initial encounter: Secondary | ICD-10-CM | POA: Diagnosis not present

## 2024-03-23 DIAGNOSIS — W010XXA Fall on same level from slipping, tripping and stumbling without subsequent striking against object, initial encounter: Secondary | ICD-10-CM | POA: Diagnosis not present

## 2024-03-23 DIAGNOSIS — M79651 Pain in right thigh: Secondary | ICD-10-CM | POA: Diagnosis not present

## 2024-03-23 DIAGNOSIS — M47816 Spondylosis without myelopathy or radiculopathy, lumbar region: Secondary | ICD-10-CM | POA: Diagnosis not present

## 2024-03-23 DIAGNOSIS — Z043 Encounter for examination and observation following other accident: Secondary | ICD-10-CM | POA: Diagnosis not present

## 2024-03-23 MED ORDER — ACETAMINOPHEN 325 MG PO TABS
650.0000 mg | ORAL_TABLET | Freq: Once | ORAL | Status: AC
  Administered 2024-03-23: 650 mg via ORAL
  Filled 2024-03-23: qty 2

## 2024-03-23 MED ORDER — ONDANSETRON 4 MG PO TBDP
4.0000 mg | ORAL_TABLET | Freq: Once | ORAL | Status: AC
  Administered 2024-03-23: 4 mg via ORAL
  Filled 2024-03-23: qty 1

## 2024-03-23 NOTE — ED Notes (Signed)
 Patient transported to X-ray

## 2024-03-23 NOTE — ED Notes (Signed)
 ED Provider at bedside.

## 2024-03-23 NOTE — Discharge Instructions (Signed)
 Overall I think you have a groin strain from your fall.  Recommend ice Tylenol  and rest.  Follow-up with orthopedics or your primary care doctor.

## 2024-03-23 NOTE — ED Provider Notes (Signed)
 [facility] Provider Note   CSN: [id number] Arrival date & time: 03/23/24  0830     History  Chief Complaint  Patient presents with   Nancy Neal    Nancy Neal is a 89 y.o. female.  Patient here with pain in her right groin area after a fall yesterday.  Patient tripped and fell while doing some yard work.  Felt a pull at her right groin area.  Has been painful to walk since but she has been able to bear weight.  Did not hit her head or lose consciousness.  She does not have pain elsewhere.  She denies any nausea vomiting diarrhea.  Is not on any blood thinners.  No neck pain.  No chest pain abdominal pain.  The history is provided by the patient.       Home Medications Prior to Admission medications   Medication Sig Start Date End Date Taking? Authorizing Provider  acetaminophen  (TYLENOL ) 325 MG tablet Take 2 tablets (650 mg total) by mouth every 4 (four) hours as needed for headache or mild pain. 12/17/15   Kilroy, Luke K, PA-C  Ascorbic Acid (VITAMIN C ER PO) Take 1 tablet by mouth daily.    [provider]  aspirin  81 MG chewable tablet Chew 1 tablet (81 mg total) by mouth daily. 12/17/15   Kilroy, Luke K, PA-C  atorvastatin  (LIPITOR ) 10 MG tablet TAKE 1 TABLET(10 MG) BY MOUTH DAILY 01/01/24   Hugh Madura, MD  Bioflavonoid Products (ESTER-C PO) Take by mouth.    [provider]  carboxymethylcellulose (REFRESH PLUS) 0.5 % SOLN Place 1 drop into both eyes 3 (three) times daily as needed (dry eyes).     [provider]  cetirizine (ZYRTEC) 10 MG chewable tablet Chew 10 mg by mouth daily.    [provider]  Cholecalciferol (VITAMIN D3) 250 MCG (10000 UT) TABS Take 1 capsule by mouth daily.    [provider]  Collagenase POWD by Does not apply route.    [provider]  fluticasone  (FLONASE ) 50 MCG/ACT nasal spray Place into both nostrils as needed for allergies. 01/25/24   [provider]  MAGNESIUM PO Take 1 tablet by mouth daily.    [provider]  Multiple Vitamin (MULTIVITAMIN WITH MINERALS) TABS tablet Take 1 tablet by mouth daily.    [provider]  nitroGLYCERIN  (NITROSTAT ) 0.4 MG SL tablet Place 0.4 mg under the tongue as needed for chest pain. 06/10/18   [provider]  Probiotic Product (CULTURELLE PROBIOTICS PO) Take by mouth.    [provider]  vitamin B-12 (CYANOCOBALAMIN ) 100 MCG tablet Take 100 mcg by mouth daily.    [provider]  vitamin E 1000 UNIT capsule Take 1,000 Units by mouth daily.    [provider]      Allergies    Codeine sulfate, Meclizine, Acetaminophen , Morphine , Other, and Tramadol    Review of Systems   Review of Systems  Physical Exam Updated Vital Signs BP (!) 163/90 (BP Location: Right Arm)   Pulse 79   Temp 98.6 F (37 C) (Oral)   Resp 18   Ht 5\' 4"  (1.626 m)   Wt 59 kg   SpO2 96%   BMI 22.31 kg/m  Physical Exam Vitals and nursing note reviewed.  Constitutional:      General: She is not in acute distress.    Appearance: She is well-developed. She is not ill-appearing.  HENT:     Head: Normocephalic and atraumatic.     Mouth/Throat:     Mouth: Mucous membranes are moist.  Eyes:     Extraocular Movements: Extraocular movements intact.     Conjunctiva/sclera: Conjunctivae normal.     Pupils: Pupils are equal, round, and reactive to light.  Cardiovascular:     Rate and Rhythm: Normal rate and regular rhythm.     Pulses: Normal pulses.     Heart sounds: Normal heart sounds. No murmur heard. Pulmonary:     Effort: Pulmonary effort is normal. No respiratory distress.     Breath sounds: Normal breath sounds.  Abdominal:     Palpations: Abdomen is soft.     Tenderness: There is no abdominal tenderness.  Musculoskeletal:        General: Tenderness present. No swelling.     Cervical back: Normal range of motion and neck supple. No tenderness.      Comments: Tenderness in the right pelvis right groin area, no midline spinal tenderness  Skin:    General: Skin is warm and dry.     Capillary Refill: Capillary refill takes less than 2 seconds.  Neurological:     General: No focal deficit present.     Mental Status: She is alert and oriented to person, place, and time.     Cranial Nerves: No cranial nerve deficit.     Sensory: No sensory deficit.     Motor: No weakness.     Coordination: Coordination normal.     Comments: Antalgic gait but normal strength and sensation throughout, normal speech normal coordination  Psychiatric:        Mood and Affect: Mood normal.     ED Results / Procedures / Treatments   Labs (all labs ordered are listed, but only abnormal results are displayed) Labs Reviewed - No data to display  EKG None  Radiology DG Pelvis 1-2 Views Result Date: 03/23/2024 CLINICAL DATA:  [id number] Fall 190176 EXAM: PELVIS - 1-2 VIEW COMPARISON:  CT 10/18/2021 FINDINGS: No fracture or dislocation. Degenerative changes in the visualized lower lumbar spine. IMPRESSION: No acute findings. Electronically Signed   By: Nicoletta Barrier M.D.   On: 03/23/2024 09:32   DG Femur Min 2 Views Right Result Date: 03/23/2024 CLINICAL DATA:  pain EXAM: RIGHT FEMUR 2 VIEWS COMPARISON:  None Available. FINDINGS: There is no evidence of fracture or other focal bone lesions. Marginal spurring about the medial compartment of the knee. Soft tissues are unremarkable. IMPRESSION: 1. No acute findings. 2. Medial compartment knee osteoarthritis. Electronically Signed   By: Nicoletta Barrier M.D.   On: 03/23/2024 09:31    Procedures Procedures    Medications Ordered in ED Medications  acetaminophen  (TYLENOL ) tablet 650 mg (650 mg Oral Given 03/23/24 0854)  ondansetron  (ZOFRAN -ODT) disintegrating tablet 4 mg (4 mg Oral Given 03/23/24 9562)    ED Course/ Medical Decision Making/ A&P                                 Medical Decision Making Amount and/or Complexity  of Data Reviewed Radiology: ordered.  Risk OTC drugs. Prescription drug management.   Nancy Neal is here with right groin pain after fall yesterday.  Normal vitals.  No fever.  No headache no neck pain.  No midline spinal tenderness.  Neurologically intact.  Not on blood thinners.  Did not hit her head or lose consciousness.  Very  well-appearing.  History of reflux CAD.  Mechanical fall yesterday.  Felt a pull in her right groin.  She is tender in the right groin area right hip right pelvis.  She is ambulatory with an antalgic gait with good strength and sensation.  She is neurovascular neuromuscular intact.  She has no midline spinal tenderness.  Overall differential diagnosis likely's groin pull/muscle strain but will get x-rays to evaluate for any fracture.  Will give Tylenol .  No pain elsewhere.  Did not hit her head or lose consciousness.  Not on blood thinners.  Fall happened yesterday.  No need for further imaging at this time.  No back pain.  X-rays per radiology report showed no acute findings.  Overall I think she has a muscle strain.  Recommend Tylenol  rest.  Follow-up with primary care doctor or orthopedics.  Discharge.  This chart was dictated using voice recognition software.  Despite best efforts to proofread,  errors can occur which can change the documentation meaning.         Final Clinical Impression(s) / ED Diagnoses Final diagnoses:  Inguinal strain, right, initial encounter    Rx / DC Orders ED Discharge Orders     None         Lowery Rue, DO 03/23/24 2440

## 2024-03-23 NOTE — ED Triage Notes (Signed)
 Pt states that she fell yesterday in her yard yesterday and hit the pavers. Denies hitting her head. State that she feels a lot of pain her groin area.

## 2024-03-29 ENCOUNTER — Ambulatory Visit (HOSPITAL_COMMUNITY)
Admission: RE | Admit: 2024-03-29 | Discharge: 2024-03-29 | Disposition: A | Discharge status: Home / Self Care | Source: Ambulatory Visit | Attending: Cardiovascular Disease | Admitting: Cardiovascular Disease

## 2024-03-29 DIAGNOSIS — I77810 Thoracic aortic ectasia: Secondary | ICD-10-CM | POA: Diagnosis not present

## 2024-03-29 DIAGNOSIS — R0602 Shortness of breath: Secondary | ICD-10-CM | POA: Diagnosis not present

## 2024-03-29 LAB — ECHOCARDIOGRAM COMPLETE
Area-P 1/2: 4.39 cm2
S' Lateral: 2.1 cm

## 2024-03-30 ENCOUNTER — Ambulatory Visit: Payer: Self-pay | Admitting: Cardiology

## 2024-04-03 ENCOUNTER — Other Ambulatory Visit: Payer: Self-pay | Admitting: Cardiology

## 2024-04-06 ENCOUNTER — Encounter: Payer: Self-pay | Admitting: Podiatry

## 2024-04-06 ENCOUNTER — Ambulatory Visit (INDEPENDENT_AMBULATORY_CARE_PROVIDER_SITE_OTHER): Payer: Medicare Other | Admitting: Podiatry

## 2024-04-06 DIAGNOSIS — B351 Tinea unguium: Secondary | ICD-10-CM

## 2024-04-06 DIAGNOSIS — M79676 Pain in unspecified toe(s): Secondary | ICD-10-CM | POA: Diagnosis not present

## 2024-04-11 NOTE — Progress Notes (Signed)
  Subjective:  Patient ID: Nancy Neal, female    DOB: April 21, 1932,  MRN: [id number]  89 y.o. female presents to clinic with  painful mycotic toenails of both feet that are difficult to trim. Pain interferes with daily activities and wearing enclosed shoe gear comfortably.  Chief Complaint  Patient presents with   RFC    Rm17 not diabetic/ Dr Rollene last visit Jan 2025   New problem(s): None   PCP is Rollene Almarie LABOR, MD.  Allergies  Allergen Reactions   Codeine Sulfate Nausea And Vomiting   Meclizine Other (See Comments)    Other reaction(s): Other (See Comments) glaucoma glaucoma   Acetaminophen  Nausea Only    Pain Medicines make her sick   Morphine     Other     All painkillers cause nausea   Tramadol     Review of Systems: Negative except as noted in the HPI.   Objective:  Nancy Neal is a pleasant 89 y.o. female WD, WN in NAD. AAO x 3.  Vascular Examination: Vascular status intact b/l with palpable pedal pulses. CFT immediate b/l. No edema. No pain with calf compression b/l. Skin temperature gradient WNL b/l. No edema noted b/l LE.  Neurological Examination: Sensation grossly intact b/l with 10 gram monofilament. Vibratory sensation intact b/l.   Dermatological Examination: Pedal skin with normal turgor, texture and tone b/l. Toenails 1-5 b/l thick, discolored, elongated with subungual debris and pain on dorsal palpation. No corns, calluses nor porokeratotic lesions noted.  Musculoskeletal Examination: Muscle strength 5/5 to b/l LE. HAV with bunion deformity noted b/l LE.  Radiographs: None  Last A1c:       No data to display           Assessment:   1. Pain due to onychomycosis of toenail    Plan:  Consent given for treatment. Patient examined. All patient's and/or POA's questions/concerns addressed on today's visit. Mycotic toenails 1-5 debrided in length and girth without incident. Continue soft, supportive shoe gear daily. Report any pedal  injuries to medical professional. Call office if there are any quesitons/concerns. -Patient/POA to call should there be question/concern in the interim.  Return in about 3 months (around 07/07/2024).  Delon LITTIE Merlin, DPM      [place] LOCATION: [address] [place] [address]                   Office [phone]   [facility] LOCATION: [address] [place], [place] [address] Office [phone]

## 2024-05-04 ENCOUNTER — Telehealth: Payer: Self-pay | Admitting: Internal Medicine

## 2024-05-04 NOTE — Telephone Encounter (Signed)
 Inbound call from patient stating she has been having loose stools starting this week. States she has not been able to control them. Patient is requesting a call to discuss options to help. Please advise, thank you

## 2024-05-04 NOTE — Telephone Encounter (Signed)
 Pt stated that she started having diarrhea over the weekend that lasted until yesterday until she took a total of 4 imodium  throughout the day. Pt has not had a BM today. Pt stated that mid week last week that she increased her magnesium to twice a day from once a day because she thought that it relaxed her more and that she could sleep better at night. Pt was notified that magnesium can cause diarrhea. Pt was notified that the recommendations were to go back to her original regiment to using the Magnesium once a day and if her symptoms have not resolved then to give our office a call back first thing on Monday.  Pt verbalized understanding with all questions answered.

## 2024-06-22 ENCOUNTER — Ambulatory Visit: Payer: Medicare Other | Admitting: Internal Medicine

## 2024-07-05 ENCOUNTER — Encounter: Payer: Self-pay | Admitting: Podiatry

## 2024-07-05 ENCOUNTER — Ambulatory Visit (INDEPENDENT_AMBULATORY_CARE_PROVIDER_SITE_OTHER): Admitting: Podiatry

## 2024-07-05 DIAGNOSIS — B351 Tinea unguium: Secondary | ICD-10-CM

## 2024-07-05 DIAGNOSIS — M79676 Pain in unspecified toe(s): Secondary | ICD-10-CM | POA: Diagnosis not present

## 2024-07-07 DIAGNOSIS — M170 Bilateral primary osteoarthritis of knee: Secondary | ICD-10-CM | POA: Insufficient documentation

## 2024-07-07 DIAGNOSIS — M1711 Unilateral primary osteoarthritis, right knee: Secondary | ICD-10-CM | POA: Diagnosis not present

## 2024-07-10 NOTE — Progress Notes (Signed)
  Subjective:  Patient ID: Nancy Neal, female    DOB: April 26, 1932,  MRN: [id number]  Nancy Neal presents to clinic today for painful thick toenails that are difficult to trim. Pain interferes with ambulation. Aggravating factors include wearing enclosed shoe gear. Pain is relieved with periodic professional debridement.  Chief Complaint  Patient presents with   Nail Problem    RFC   New problem(s): None.   PCP is Rollene Almarie LABOR, MD.  Allergies  Allergen Reactions   Codeine Sulfate Nausea And Vomiting   Meclizine Other (See Comments)    Other reaction(s): Other (See Comments) glaucoma glaucoma   Acetaminophen  Nausea Only    Pain Medicines make her sick   Morphine     Other     All painkillers cause nausea   Tramadol     Review of Systems: Negative except as noted in the HPI.  Objective: No changes noted in today's physical examination. There were no vitals filed for this visit. Nancy Neal is a pleasant 89 y.o. female WD, WN in NAD. AAO x 3.  Vascular Examination: Capillary refill time immediate b/l. Palpable pedal pulses. Pedal hair present b/l. Pedal edema absent. No pain with calf compression b/l. Skin temperature gradient WNL b/l. No cyanosis or clubbing b/l. No ischemia or gangrene noted b/l.   Neurological Examination: Sensation grossly intact b/l with 10 gram monofilament. Vibratory sensation intact b/l.   Dermatological Examination: Pedal skin with normal turgor, texture and tone b/l.  No open wounds. No interdigital macerations.   Toenails 1-5 b/l thick, discolored, elongated with subungual debris and pain on dorsal palpation.   No hyperkeratotic nor porokeratotic lesions.  Musculoskeletal Examination: Muscle strength 5/5 to all lower extremity muscle groups bilaterally. No pain, crepitus or joint limitation noted with ROM bilateral LE. HAV with bunion deformity noted b/l LE.  Radiographs: None  Assessment/Plan: 1. Pain due to onychomycosis of  toenail   Patient was evaluated and treated. All patient's and/or POA's questions/concerns addressed on today's visit. Toenails 1-5 debrided in length and girth without incident. Continue soft, supportive shoe gear daily. Report any pedal injuries to medical professional. Call office if there are any questions/concerns. -Patient/POA to call should there be question/concern in the interim.   Return in about 3 months (around 10/04/2024).  Delon LITTIE Merlin, DPM      [place] LOCATION: [address] [place] [address]                   Office [phone]   [facility] LOCATION: [address] [place], [place] [address] Office [phone]

## 2024-07-12 ENCOUNTER — Other Ambulatory Visit: Payer: Medicare Other

## 2024-07-17 ENCOUNTER — Other Ambulatory Visit: Payer: Self-pay

## 2024-07-17 ENCOUNTER — Emergency Department (HOSPITAL_BASED_OUTPATIENT_CLINIC_OR_DEPARTMENT_OTHER)

## 2024-07-17 ENCOUNTER — Emergency Department (HOSPITAL_BASED_OUTPATIENT_CLINIC_OR_DEPARTMENT_OTHER)
Admission: EM | Admit: 2024-07-17 | Discharge: 2024-07-17 | Disposition: A | Discharge status: Home / Self Care | Attending: Emergency Medicine | Admitting: Emergency Medicine

## 2024-07-17 ENCOUNTER — Encounter (HOSPITAL_BASED_OUTPATIENT_CLINIC_OR_DEPARTMENT_OTHER): Payer: Self-pay

## 2024-07-17 DIAGNOSIS — S46012A Strain of muscle(s) and tendon(s) of the rotator cuff of left shoulder, initial encounter: Secondary | ICD-10-CM | POA: Diagnosis not present

## 2024-07-17 DIAGNOSIS — M19012 Primary osteoarthritis, left shoulder: Secondary | ICD-10-CM | POA: Diagnosis not present

## 2024-07-17 DIAGNOSIS — M25512 Pain in left shoulder: Secondary | ICD-10-CM | POA: Diagnosis not present

## 2024-07-17 DIAGNOSIS — W1839XA Other fall on same level, initial encounter: Secondary | ICD-10-CM | POA: Diagnosis not present

## 2024-07-17 DIAGNOSIS — Z7982 Long term (current) use of aspirin: Secondary | ICD-10-CM | POA: Insufficient documentation

## 2024-07-17 DIAGNOSIS — S46912A Strain of unspecified muscle, fascia and tendon at shoulder and upper arm level, left arm, initial encounter: Secondary | ICD-10-CM | POA: Diagnosis not present

## 2024-07-17 DIAGNOSIS — S4992XA Unspecified injury of left shoulder and upper arm, initial encounter: Secondary | ICD-10-CM | POA: Diagnosis not present

## 2024-07-17 NOTE — ED Triage Notes (Signed)
 Pt reports falling 8/31 while trying to open sliding door and handle broke. Continues to have left shoulder pain. Pain worse with movement.  No LOC Denies thinners

## 2024-07-17 NOTE — Discharge Instructions (Signed)
 The x-ray of your left shoulder did not show any fracture or dislocation.  You likely have a sprain of the rotator cuff muscle based on your exam today.  This can take a couple months to fully heal.  Please continue with your home exercises as prescribed by your orthopedic provider.  You may take up to 1000mg  of tylenol  every 6 hours as needed for pain.  Do not take more then 4g per day.  You may use up to 400mg -600mg  ibuprofen  every 6 hours as needed for pain.  Do not exceed 2.4g of ibuprofen  per day.  You may apply heat to the shoulder to help with pain.  Please follow-up with your orthopedic provider within the next 2 weeks for further management.  They can also refer you to physical therapy if needed.  Please return to the ER for any numbness in your hand, severe uncontrolled pain, any other new or concerning symptoms

## 2024-07-17 NOTE — ED Provider Notes (Signed)
 [place] [facility]MENT AT [facility] Provider Note   CSN: [id number] Arrival date & time: 07/17/24  1310     Patient presents with: Fall and Shoulder Pain   Nancy Neal is a 89 y.o. female who presents with concern for left shoulder pain that began after a fall that occurred on 06/19/2024.  Reports she was opening a door when the handle broke and it caused her to fall onto her left shoulder.  Since then, she has had continued pain to the left shoulder.  Pain is worse in movement.  Denies any numbness in the left upper extremity.  She did not hit her head or lose consciousness.  Not on any blood thinners.    Fall  Shoulder Pain      Prior to Admission medications   Medication Sig Start Date End Date Taking? Authorizing Provider  acetaminophen  (TYLENOL ) 325 MG tablet Take 2 tablets (650 mg total) by mouth every 4 (four) hours as needed for headache or mild pain. 12/17/15   Kilroy, Luke K, PA-C  Ascorbic Acid (VITAMIN C ER PO) Take 1 tablet by mouth daily.    [provider]  aspirin  81 MG chewable tablet Chew 1 tablet (81 mg total) by mouth daily. 12/17/15   Kilroy, Luke K, PA-C  atorvastatin  (LIPITOR ) 10 MG tablet TAKE 1 TABLET BY MOUTH DAILY 04/05/24   Jeffrie Oneil BROCKS, MD  Bioflavonoid Products (ESTER-C PO) Take by mouth.    [provider]  carboxymethylcellulose (REFRESH PLUS) 0.5 % SOLN Place 1 drop into both eyes 3 (three) times daily as needed (dry eyes).     [provider]  cetirizine (ZYRTEC) 10 MG chewable tablet Chew 10 mg by mouth daily.    [provider]  Cholecalciferol (VITAMIN D3) 250 MCG (10000 UT) TABS Take 1 capsule by mouth daily.    [provider]  Collagenase POWD by Does not apply route.    [provider]  fluticasone  (FLONASE ) 50 MCG/ACT nasal spray Place into both nostrils as needed for allergies. 01/25/24   [provider]  MAGNESIUM PO Take 1 tablet by mouth daily.    [provider]  Multiple Vitamin (MULTIVITAMIN WITH MINERALS) TABS tablet Take 1 tablet by mouth daily.    [provider]  nitroGLYCERIN  (NITROSTAT ) 0.4 MG SL tablet Place 0.4 mg under the tongue as needed for chest pain. 06/10/18   [provider]  Probiotic Product (CULTURELLE PROBIOTICS PO) Take by mouth.    [provider]  vitamin B-12 (CYANOCOBALAMIN ) 100 MCG tablet Take 100 mcg by mouth daily.    [provider]  vitamin E 1000 UNIT capsule Take 1,000 Units by mouth daily.    [provider]    Allergies: Codeine sulfate, Meclizine, Acetaminophen , Morphine , Other, and Tramadol    Review of Systems  Musculoskeletal:        Left shoulder pain    Updated Vital Signs BP (!) 156/87 (BP Location: Right Arm)   Pulse 75   Temp 97.6 F (36.4 C) (Oral)   Resp 20   Wt 58.1 kg   SpO2 96%   BMI 21.97 kg/m   Physical Exam Vitals and nursing note reviewed.  Constitutional:      Appearance: Normal appearance.  HENT:     Head: Atraumatic.  Cardiovascular:     Comments: 2+ radial pulse bilaterally Pulmonary:     Effort: Pulmonary effort is normal.  Musculoskeletal:     Comments: Left  upper extremity:  General No obvious deformity. No erythema, edema, contusions, open wounds   Palpation Non-tender to palpation along the clavicle, AC joint, glenohumeral joint, humeral head, or distal humerus. Non-tender to palpation over the scapular ridge   Tender to the musculature of the supraspinatus and deltoid musculature.  ROM Full flexion, extension, internal, external rotation, abduction, adduction of the left shoulder  Special tests Negative drop arm sign Pain with empty can testing    Neurological:     General: No focal deficit present.     Mental Status: She is alert.     Comments: Sensation: Sensation intact throughout the left upper extremity  Strength: 5/5 strength with infraspinatus, subscapularis testing bilaterally 4/5  strength with supraspinatus testing LUE, 5/5 RUE   Psychiatric:        Mood and Affect: Mood normal.        Behavior: Behavior normal.     (all labs ordered are listed, but only abnormal results are displayed) Labs Reviewed - No data to display  EKG: None  Radiology: DG Shoulder Left Result Date: 07/17/2024 EXAM: 1 VIEW XRAY OF THE LEFT SHOULDER 07/17/2024 02:03:53 PM COMPARISON: None available. CLINICAL HISTORY: fall on 06/19/24. Table formatting from the original note was not included.; Pt reports falling 8/31 while trying to open sliding door and handle broke. Continues to have left shoulder pain. Pain worse with movement. FINDINGS: BONES AND JOINTS: Glenohumeral joint is normally aligned. No acute fracture or dislocation. Mild degenerative changes of the acromioclavicular joint. SOFT TISSUES: No abnormal calcifications. Visualized lung is unremarkable. IMPRESSION: 1. No acute fracture or dislocation. Electronically signed by: Waddell Calk MD 07/17/2024 02:30 PM EDT RP Workstation: [id number]     Procedures   Medications Ordered in the ED - No data to display                                  Medical Decision Making Amount and/or Complexity of Data Reviewed Radiology: ordered.    Differential diagnosis includes but is not limited to fracture, dislocation, sprain, strain, contusion, laceration, nerve injury, vascular injury  ED Course:  Upon initial evaluation, patient is well-appearing, no acute distress.  She has had pain of the left shoulder ongoing for about 2 months now after a mechanical fall.  She is most tender over the left supraspinatus and deltoid musculature.  She is nontender to the clavicle, humerus, radius, ulna.  X-ray of the left shoulder is without any acute abnormality.  She has pain reproduced with empty can testing.  Negative drop arm sign.  She remains neurovascularly intact in the bilateral upper extremities.  Based on her exam and negative imaging,  suspect muscle strain as the cause of her pain.  She does not have any decreased range of motion of the left shoulder, no overlying erythema, edema, wounds, no concern for a septic arthritis or infectious etiology to her pain. Patient stable and appropriate for discharge home.   Imaging Studies ordered: I ordered imaging studies including x-ray left shoulder I independently visualized the imaging with scope of interpretation limited to determining acute life threatening conditions related to emergency care. Imaging showed no acute fracture or dislocation I agree with the radiologist interpretation   Impression: Left shoulder strain   Disposition:  Patient discharged home with instructions to follow-up with her orthopedic provider within the next 2 weeks regarding her shoulder pain.  She states she already has an orthopedic provider  she states.  Tylenol  and ibuprofen  as needed for pain.  Return precautions given.     This chart was dictated using voice recognition software, Dragon. Despite the best efforts of this provider to proofread and correct errors, errors may still occur which can change documentation meaning.       Final diagnoses:  Rotator cuff strain, left, initial encounter    ED Discharge Orders     None          Veta Palma, PA-C 07/17/24 1611    Ruthe Cornet, DO 07/17/24 1614

## 2024-07-19 ENCOUNTER — Ambulatory Visit: Payer: Medicare Other | Admitting: Internal Medicine

## 2024-08-11 ENCOUNTER — Ambulatory Visit (HOSPITAL_BASED_OUTPATIENT_CLINIC_OR_DEPARTMENT_OTHER)
Admission: RE | Admit: 2024-08-11 | Discharge: 2024-08-11 | Disposition: A | Discharge status: Home / Self Care | Source: Ambulatory Visit | Attending: Internal Medicine | Admitting: Internal Medicine

## 2024-08-11 DIAGNOSIS — Z780 Asymptomatic menopausal state: Secondary | ICD-10-CM | POA: Diagnosis not present

## 2024-08-11 DIAGNOSIS — M810 Age-related osteoporosis without current pathological fracture: Secondary | ICD-10-CM | POA: Diagnosis not present

## 2024-08-12 ENCOUNTER — Ambulatory Visit: Payer: Self-pay | Admitting: Internal Medicine

## 2024-08-12 LAB — HM DEXA SCAN: HM Dexa Scan: NORMAL

## 2024-08-17 DIAGNOSIS — M25512 Pain in left shoulder: Secondary | ICD-10-CM | POA: Diagnosis not present

## 2024-08-17 DIAGNOSIS — M7542 Impingement syndrome of left shoulder: Secondary | ICD-10-CM | POA: Diagnosis not present

## 2024-08-18 ENCOUNTER — Ambulatory Visit (INDEPENDENT_AMBULATORY_CARE_PROVIDER_SITE_OTHER): Admitting: Internal Medicine

## 2024-08-18 ENCOUNTER — Encounter: Payer: Self-pay | Admitting: Internal Medicine

## 2024-08-18 VITALS — BP 148/70 | HR 50 | Ht 64.0 in | Wt 129.2 lb

## 2024-08-18 DIAGNOSIS — M810 Age-related osteoporosis without current pathological fracture: Secondary | ICD-10-CM | POA: Diagnosis not present

## 2024-08-18 NOTE — Progress Notes (Signed)
 Name: Nancy Neal  MRN/ DOB: [id number], 07/18/1932    Age/ Sex: 89 y.o., female    PCP: Rollene Almarie LABOR, MD   Reason for Endocrinology Evaluation: Osteoporosis     Date of Initial Endocrinology Evaluation: 03/20/2023    HPI: Nancy Neal is a 89 y.o. female with a past medical history of osteoporosis and dyslipidemia. The patient presented for initial endocrinology clinic visit on 03/20/2023 for consultative assistance with her osteoporosis  Pt was diagnosed with osteoporosis: 11/2021  Menarche at age : ~55 yrs  Menopausal at age : Hysterectomy with oophorectomy at age 64 Fracture Hx: no Hx of HRT: yes for a few years  FH of osteoporosis or hip fracture:  Prior Hx of anti-resorptive therapy : n/a  She started on Osteo strong in  05/2023   On her initial visit to our clinic she declined all antiresorptive therapy  SUBJECTIVE:    Today (08/18/24): Nancy Neal is here for a follow-up on osteoporosis.   She has NOT been to our clinic in 17 months.  She did sustain a fall in August, 2024 with no fractures She did fall two more times this year She continues to go  Osteostrong  No heartburn  She was diagnosed with left arm bursitis secondary to a fall , received intra-articular injection with exercises   She met with Dr. Prentice Neighbors at [facility] , he recommended to switch her supplements to algae Cal.  He also performed bone density imaging and per patient was told her bones look good   MVI Calcium  600+ 200  Vit D 5000+ 10,000+ 1000 + 500    HISTORY:  Past Medical History:  Past Medical History:  Diagnosis Date   ALLERGIC RHINITIS 06/02/2007   Arthritis    maybe in my right knee (12/19/2015)   Coronary artery disease 11/2015   a. 11/2015: Inferior STEMI w/ Promus DES to RCA.     Diverticulosis of colon 2003   by 2003 and 2008 colonoscopy.    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   History of blood transfusion 1971    when I had hysterectomy   Myocardial infarction (HCC) 11/2015   OSTEOPENIA 06/02/2007   VAGINITIS, ATROPHIC, POSTMENOPAUSAL 12/06/2008   VERTIGO 02/16/2009   Past Surgical History:  Past Surgical History:  Procedure Laterality Date   APPENDECTOMY  1971   CARDIAC CATHETERIZATION N/A 12/15/2015   Procedure: Left Heart Cath and Coronary Angiography;  Surgeon: Victory LELON Sharps, MD;  Location: [facility] INVASIVE CV LAB;  Service: Cardiovascular;  Laterality: N/A;   CARDIAC CATHETERIZATION N/A 12/15/2015   Procedure: Coronary Stent Intervention;  Surgeon: Victory LELON Sharps, MD;  Location: [facility] INVASIVE CV LAB;  Service: Cardiovascular;  Laterality: N/A;   COLONOSCOPY N/A 12/20/2015   Procedure: COLONOSCOPY;  Surgeon: Norleen LOISE Kiang, MD;  Location: [facility] ENDOSCOPY;  Service: Endoscopy;  Laterality: N/A;   EYE SURGERY Bilateral    laser tx for glaucoma   HEMORRHOID SURGERY  ~ 1968   SHOULDER ARTHROSCOPY W/ ROTATOR CUFF REPAIR Right 11/2014   TOTAL ABDOMINAL HYSTERECTOMY  1971    Social History:  reports that she has never smoked. She has been exposed to tobacco smoke. She has never used smokeless tobacco. She reports that she does not currently use alcohol . She reports that she does not use drugs. Family History: family history includes Arthritis in her brother; Heart disease in her father; Suicidality in her mother.  HOME MEDICATIONS: Allergies as of 08/18/2024       Reactions   Codeine Sulfate Nausea And Vomiting   Meclizine Other (See Comments)   Other reaction(s): Other (See Comments) glaucoma glaucoma   Acetaminophen  Nausea Only   Pain Medicines make her sick   Morphine     Other    All painkillers cause nausea   Tramadol         Medication List        Accurate as of August 18, 2024  9:56 AM. If you have any questions, ask your nurse or doctor.          acetaminophen  325 MG tablet Commonly known as: TYLENOL  Take 2 tablets (650 mg total) by mouth every 4 (four) hours as needed for  headache or mild pain.   aspirin  81 MG chewable tablet Chew 1 tablet (81 mg total) by mouth daily.   atorvastatin  10 MG tablet Commonly known as: LIPITOR  TAKE 1 TABLET BY MOUTH DAILY   carboxymethylcellulose 0.5 % Soln Commonly known as: REFRESH PLUS Place 1 drop into both eyes 3 (three) times daily as needed (dry eyes).   cetirizine 10 MG chewable tablet Commonly known as: ZYRTEC Chew 10 mg by mouth daily.   Collagenase Powd by Does not apply route.   CULTURELLE PROBIOTICS PO Take by mouth.   ESTER-C PO Take by mouth.   fluticasone  50 MCG/ACT nasal spray Commonly known as: FLONASE  Place into both nostrils as needed for allergies.   MAGNESIUM PO Take 1 tablet by mouth daily.   multivitamin with minerals Tabs tablet Take 1 tablet by mouth daily.   nitroGLYCERIN  0.4 MG SL tablet Commonly known as: NITROSTAT  Place 0.4 mg under the tongue as needed for chest pain.   vitamin B-12 100 MCG tablet Commonly known as: CYANOCOBALAMIN  Take 100 mcg by mouth daily.   VITAMIN C ER PO Take 1 tablet by mouth daily.   Vitamin D3 250 MCG (10000 UT) Tabs Take 1 capsule by mouth daily.   vitamin E 1000 UNIT capsule Take 1,000 Units by mouth daily.          REVIEW OF SYSTEMS: A comprehensive ROS was conducted with the patient and is negative except as per HPI    OBJECTIVE:  VS: BP (!) 148/70   Pulse (!) 50   Ht 5' 4 (1.626 m)   Wt 129 lb 4 oz (58.6 kg)   SpO2 98%   BMI 22.19 kg/m    Wt Readings from Last 3 Encounters:  08/18/24 129 lb 4 oz (58.6 kg)  07/17/24 128 lb (58.1 kg)  03/23/24 130 lb (59 kg)     EXAM: General: Pt appears well and is in NAD  Neck: General: Supple without adenopathy. Thyroid : Thyroid  size normal.  No goiter or nodules appreciated.   Lungs: Clear with good BS bilat   Heart: Auscultation: RRR.  Extremities:  BL LE: No pretibial edema   Mental Status: Judgment, insight: Intact Orientation: Oriented to time, place, and person Mood  and affect: No depression, anxiety, or agitation     DATA REVIEWED:      DXA 08/11/2024 FINDINGS: Scan quality: Good.   LEFT FEMORAL NECK:   BMD (in g/cm2): 0.753   T-score: -2.0   Z-score: 0.7   LEFT TOTAL HIP:   BMD (in g/cm2): 0.777   T-score: -1.8   Z-score: 0.9   RIGHT FEMORAL NECK:   BMD (in g/cm2): 0.653   T-score: -2.8   Z-score: 0.0  RIGHT TOTAL HIP:   BMD (in g/cm2): 0.674   T-score: -2.6   Z-score: 0.1   LEFT FOREARM (RADIUS 33%):   BMD (in g/cm2): 0.549   T-score: -3.7   Z-score: -0.1   FRAX 10-YEAR PROBABILITY OF FRACTURE:   FRAX not reported as the lowest BMD is not in the osteopenia range.   IMPRESSION: Osteoporosis based on BMD.   Fracture risk is increased. Increased risk is based on low BMD.    ASSESSMENT/PLAN/RECOMMENDATIONS:   Osteoporosis:  -Patient continues with severe osteoporosis -Unable to compare T-scores as she used a different location than prior -She continues to be on calcium  and vitamin D , she did meet with Dr. Levy from orthopedics and he recommended switching to algae Cal, I did encourage the patient to give algae Cal to try but she should discontinue her current vitamin D  and calcium  but continue multivitamin -She continues with weightbearing exercises through osteo strong -She has declined  all antiresorptive therapy due to concerns regarding side effects - Patient would like to continue with lifestyle changes, and the fact that she had 3 falls this year with no fracture tells me that she is doing good - I have encouraged her to continue with weightbearing exercises, optimizing calcium  and vitamin D  intake - Patient would like to follow-up in 2 years after her next bone density   I spent 25 minutes preparing to see the patient by review of recent labs, imaging and procedures, obtaining and reviewing separately obtained history, communicating with the patient/family or caregiver, ordering medications,  tests or procedures, and documenting clinical information in the EHR including the differential Dx, treatment, and any further evaluation and other management   Signed electronically by: Stefano Redgie Butts, MD  [facility] Endocrinology  [facility] [address] [place] [place], [place] [address] Phone: [phone] FAX: [phone]   CC: Rollene Almarie LABOR, MD [address] [place] [address] Phone: [phone] Fax: [phone]   Return to Endocrinology clinic as below: Future Appointments  Date Time Provider Department Center  10/25/2024 10:00 AM Gaynel Delon CROME, DPM [facility]  11/03/2024  2:30 PM [facility]-ANNUAL WELLNESS VISIT [facility]  01/25/2025  2:00 PM Gaynel Delon CROME, DPM [facility]

## 2024-08-18 NOTE — Patient Instructions (Signed)
 Continue with calcium  and vitamin D  intake, you may substitute both of these with algae Cal as it does contain calcium  and vitamin D   Continue with weightbearing exercises  Will follow-up after your next bone density

## 2024-08-24 DIAGNOSIS — H401111 Primary open-angle glaucoma, right eye, mild stage: Secondary | ICD-10-CM | POA: Diagnosis not present

## 2024-08-24 DIAGNOSIS — Z961 Presence of intraocular lens: Secondary | ICD-10-CM | POA: Diagnosis not present

## 2024-08-24 DIAGNOSIS — H401122 Primary open-angle glaucoma, left eye, moderate stage: Secondary | ICD-10-CM | POA: Diagnosis not present

## 2024-10-19 ENCOUNTER — Emergency Department (HOSPITAL_BASED_OUTPATIENT_CLINIC_OR_DEPARTMENT_OTHER)
Admission: EM | Admit: 2024-10-19 | Discharge: 2024-10-19 | Disposition: A | Discharge status: Home / Self Care | Attending: Emergency Medicine | Admitting: Emergency Medicine

## 2024-10-19 ENCOUNTER — Other Ambulatory Visit: Payer: Self-pay

## 2024-10-19 ENCOUNTER — Encounter (HOSPITAL_BASED_OUTPATIENT_CLINIC_OR_DEPARTMENT_OTHER): Payer: Self-pay

## 2024-10-19 ENCOUNTER — Emergency Department (HOSPITAL_BASED_OUTPATIENT_CLINIC_OR_DEPARTMENT_OTHER)

## 2024-10-19 ENCOUNTER — Ambulatory Visit: Payer: Self-pay

## 2024-10-19 ENCOUNTER — Other Ambulatory Visit (HOSPITAL_BASED_OUTPATIENT_CLINIC_OR_DEPARTMENT_OTHER): Payer: Self-pay

## 2024-10-19 DIAGNOSIS — Z7982 Long term (current) use of aspirin: Secondary | ICD-10-CM | POA: Insufficient documentation

## 2024-10-19 DIAGNOSIS — I2510 Atherosclerotic heart disease of native coronary artery without angina pectoris: Secondary | ICD-10-CM | POA: Diagnosis not present

## 2024-10-19 DIAGNOSIS — J0100 Acute maxillary sinusitis, unspecified: Secondary | ICD-10-CM | POA: Insufficient documentation

## 2024-10-19 DIAGNOSIS — R0981 Nasal congestion: Secondary | ICD-10-CM | POA: Diagnosis present

## 2024-10-19 LAB — CBC WITH DIFFERENTIAL/PLATELET
Abs Immature Granulocytes: 0.02 K/uL (ref 0.00–0.07)
Basophils Absolute: 0 K/uL (ref 0.0–0.1)
Basophils Relative: 1 %
Eosinophils Absolute: 0.2 K/uL (ref 0.0–0.5)
Eosinophils Relative: 3 %
HCT: 42.9 % (ref 36.0–46.0)
Hemoglobin: 14.3 g/dL (ref 12.0–15.0)
Immature Granulocytes: 0 %
Lymphocytes Relative: 24 %
Lymphs Abs: 1.3 K/uL (ref 0.7–4.0)
MCH: 31.9 pg (ref 26.0–34.0)
MCHC: 33.3 g/dL (ref 30.0–36.0)
MCV: 95.8 fL (ref 80.0–100.0)
Monocytes Absolute: 0.7 K/uL (ref 0.1–1.0)
Monocytes Relative: 13 %
Neutro Abs: 3.1 K/uL (ref 1.7–7.7)
Neutrophils Relative %: 59 %
Platelets: 247 K/uL (ref 150–400)
RBC: 4.48 MIL/uL (ref 3.87–5.11)
RDW: 13.1 % (ref 11.5–15.5)
WBC: 5.2 K/uL (ref 4.0–10.5)
nRBC: 0 % (ref 0.0–0.2)

## 2024-10-19 LAB — BASIC METABOLIC PANEL WITH GFR
Anion gap: 9 (ref 5–15)
BUN: 24 mg/dL — ABNORMAL HIGH (ref 8–23)
CO2: 26 mmol/L (ref 22–32)
Calcium: 9.4 mg/dL (ref 8.9–10.3)
Chloride: 107 mmol/L (ref 98–111)
Creatinine, Ser: 0.93 mg/dL (ref 0.44–1.00)
GFR, Estimated: 58 mL/min — ABNORMAL LOW
Glucose, Bld: 93 mg/dL (ref 70–99)
Potassium: 4.3 mmol/L (ref 3.5–5.1)
Sodium: 142 mmol/L (ref 135–145)

## 2024-10-19 LAB — GROUP A STREP BY PCR: Group A Strep by PCR: NOT DETECTED

## 2024-10-19 MED ORDER — AMOXICILLIN 500 MG PO CAPS
500.0000 mg | ORAL_CAPSULE | Freq: Two times a day (BID) | ORAL | 0 refills | Status: AC
  Filled 2024-10-19: qty 14, 7d supply, fill #0

## 2024-10-19 MED ORDER — IOHEXOL 300 MG/ML  SOLN
75.0000 mL | Freq: Once | INTRAMUSCULAR | Status: AC | PRN
  Administered 2024-10-19: 75 mL via INTRAVENOUS

## 2024-10-19 NOTE — ED Triage Notes (Signed)
 Reports sore throat for 1 week. States feels lump in throat. Denies difficulty breathing or swallowing. Swelling noted to back of throat, airway patent

## 2024-10-19 NOTE — ED Notes (Signed)
 Patient transported to CT

## 2024-10-19 NOTE — Discharge Instructions (Addendum)
 Your blood work and x-ray results were good today but does show that you have a sinus infection.  Continue doing the sinus rinses and we will start an antibiotic.  If you start having any trouble breathing or swallowing return to the emergency room

## 2024-10-19 NOTE — ED Provider Notes (Signed)
 " [employer] [facility] AT [facility] Provider Note   CSN: [id number] Arrival date & time: 10/19/24  1029     Patient presents with: Sore Throat   NDEA KILROY is a 89 y.o. female.   Patient is a 89 year old female with a history of CAD status post stent, GERD and allergic rhinitis who is presenting today with complaints of feeling like there is a lump in her throat.  She reports she had nasal congestion and sore throat last week that resolved but then returned 3 to 4 days ago.  She now reports the pain in her throat is gone but she feels like there is a lump in her throat every time she swallows and when she speaks.  Her voice is also changed.  She denies any difficulty breathing or swallowing.  She does complain of ongoing nasal congestion and some mild facial pain.  She has not had a fever nausea or vomiting.  The history is provided by the patient and medical records.  Sore Throat       Prior to Admission medications  Medication Sig Start Date End Date Taking? Authorizing Provider  amoxicillin  (AMOXIL ) 500 MG capsule Take 1 capsule (500 mg total) by mouth 2 (two) times daily. 10/19/24  Yes Doretha Folks, MD  acetaminophen  (TYLENOL ) 325 MG tablet Take 2 tablets (650 mg total) by mouth every 4 (four) hours as needed for headache or mild pain. 12/17/15   Kilroy, Luke K, PA-C  Ascorbic Acid (VITAMIN C ER PO) Take 1 tablet by mouth daily.    [provider]  aspirin  81 MG chewable tablet Chew 1 tablet (81 mg total) by mouth daily. 12/17/15   Kilroy, Luke K, PA-C  atorvastatin  (LIPITOR ) 10 MG tablet TAKE 1 TABLET BY MOUTH DAILY 04/05/24   Jeffrie Oneil BROCKS, MD  Bioflavonoid Products (ESTER-C PO) Take by mouth.    [provider]  carboxymethylcellulose (REFRESH PLUS) 0.5 % SOLN Place 1 drop into both eyes 3 (three) times daily as needed (dry eyes).     [provider]  cetirizine (ZYRTEC) 10 MG chewable tablet Chew 10 mg by mouth daily.     [provider]  Cholecalciferol (VITAMIN D3) 250 MCG (10000 UT) TABS Take 1 capsule by mouth daily.    [provider]  Collagenase POWD by Does not apply route.    [provider]  fluticasone  (FLONASE ) 50 MCG/ACT nasal spray Place into both nostrils as needed for allergies. 01/25/24   [provider]  MAGNESIUM PO Take 1 tablet by mouth daily.    [provider]  Multiple Vitamin (MULTIVITAMIN WITH MINERALS) TABS tablet Take 1 tablet by mouth daily.    [provider]  nitroGLYCERIN  (NITROSTAT ) 0.4 MG SL tablet Place 0.4 mg under the tongue as needed for chest pain. 06/10/18   [provider]  Probiotic Product (CULTURELLE PROBIOTICS PO) Take by mouth.    [provider]  vitamin B-12 (CYANOCOBALAMIN ) 100 MCG tablet Take 100 mcg by mouth daily.    [provider]  vitamin E 1000 UNIT capsule Take 1,000 Units by mouth daily.    [provider]    Allergies: Codeine sulfate, Meclizine, Acetaminophen , Morphine , Other, and Tramadol    Review of Systems  Updated Vital Signs BP (!) 164/99 (BP Location: Right Arm)   Pulse 74   Temp 98 F (36.7 C) (Oral)   Resp 18   SpO2 94%   Physical Exam Vitals and nursing  note reviewed.  Constitutional:      General: She is not in acute distress.    Appearance: She is well-developed.  HENT:     Head: Normocephalic and atraumatic.     Right Ear: Tympanic membrane normal.     Left Ear: Tympanic membrane normal.     Mouth/Throat:     Mouth: Mucous membranes are moist. No oral lesions.     Pharynx: Uvula midline. Posterior oropharyngeal erythema present. No pharyngeal swelling, oropharyngeal exudate or uvula swelling.     Comments: Hoarseness of the voice Eyes:     Pupils: Pupils are equal, round, and reactive to light.  Neck:     Comments: No anterior neck tenderness or masses noted.  No stridor Cardiovascular:     Rate and Rhythm: Normal rate and regular  rhythm.     Heart sounds: Normal heart sounds. No murmur heard.    No friction rub.  Pulmonary:     Effort: Pulmonary effort is normal.     Breath sounds: Normal breath sounds. No wheezing or rales.  Abdominal:     General: Bowel sounds are normal. There is no distension.     Palpations: Abdomen is soft.     Tenderness: There is no abdominal tenderness. There is no guarding or rebound.  Musculoskeletal:        General: No tenderness. Normal range of motion.     Comments: No edema  Lymphadenopathy:     Cervical: No cervical adenopathy.  Skin:    General: Skin is warm and dry.     Findings: No rash.  Neurological:     Mental Status: She is alert and oriented to person, place, and time.     Cranial Nerves: No cranial nerve deficit.  Psychiatric:        Behavior: Behavior normal.     (all labs ordered are listed, but only abnormal results are displayed) Labs Reviewed  BASIC METABOLIC PANEL WITH GFR - Abnormal; Notable for the following components:      Result Value   BUN 24 (*)    GFR, Estimated 58 (*)    All other components within normal limits  GROUP A STREP BY PCR  CBC WITH DIFFERENTIAL/PLATELET    EKG: None  Radiology: CT Soft Tissue Neck W Contrast Result Date: 10/19/2024 EXAM: CT NECK WITH CONTRAST 10/19/2024 01:04:04 PM TECHNIQUE: CT of the neck was performed with the administration of intravenous contrast. 75 mL of iohexol  (OMNIPAQUE ) 300 MG/ML solution was administered. Multiplanar reformatted images are provided for review. Automated exposure control, iterative reconstruction, and/or weight based adjustment of the mA/kV was utilized to reduce the radiation dose to as low as reasonably achievable. COMPARISON: CT head and cervical spine 06/08/2023. CLINICAL HISTORY: Epiglottitis or tonsillitis suspected; voice changes and lump in throat. FINDINGS: AERODIGESTIVE TRACT: No discrete mass. No edema. SALIVARY GLANDS: The parotid and submandibular glands are unremarkable.  THYROID : Unremarkable. LYMPH NODES: No suspicious cervical lymphadenopathy. SOFT TISSUES: No mass or fluid collection. BONES: Advanced multilevel cervical disc and facet degeneration. OTHER: Bilateral cataract extraction. Moderate mucosal thickening in the maxillary sinuses. Left canal wall up mastoidectomy. Mild biapical pleural parenchymal lung scarring. IMPRESSION: 1. No acute abnormality identified in the neck. Electronically signed by: Dasie Hamburg MD 10/19/2024 01:30 PM EST RP Workstation: [id number]     Procedures   Medications Ordered in the ED  iohexol  (OMNIPAQUE ) 300 MG/ML solution 75 mL (75 mLs Intravenous Contrast Given 10/19/24 1252)  Medical Decision Making Amount and/or Complexity of Data Reviewed Labs: ordered. Decision-making details documented in ED Course. Radiology: ordered and independent interpretation performed. Decision-making details documented in ED Course.  Risk Prescription drug management.   Pt  presenting today with a complaint that caries a high risk for morbidity and mortality. Complaining of a sensation of a lump in her throat.  She is not having any difficulty breathing at this time or swallowing.  She is tolerating her secretions and does not have stridor.  No obvious signs of swelling when looking at her posterior pharynx however concern for possible epiglottitis, mass or RPA.  Also possibility for mild edema from chronic postnasal drip and laryngitis.  Patient is nontoxic-appearing at this time and denies any fever.  1:52 PM I independently interpreted patient's labs and BMP and CBC are within normal limits.  I have independently visualized and interpreted pt's images today.  Soft tissue neck shows no evidence of epiglottitis or abscess.  Does appear to have sinus disease.  Radiology reports no acute abnormalities.  Discussed all the findings with the patient.  Will have her continue to use the Nettie pot and will do  amoxicillin  for concern for ongoing sinus infection.      Final diagnoses:  Subacute maxillary sinusitis    ED Discharge Orders          Ordered    amoxicillin  (AMOXIL ) 500 MG capsule  2 times daily        10/19/24 1351               Doretha Folks, MD 10/19/24 1352  "

## 2024-10-19 NOTE — Telephone Encounter (Signed)
 FYI Only or Action Required?: FYI only for provider: Heading to UC.  Patient was last seen in primary care on 01/14/2024 by Rollene Almarie LABOR, MD.  Called Nurse Triage reporting Sore Throat.  Symptoms began several weeks ago.  Interventions attempted: Rest, hydration, or home remedies.  Symptoms are: gradually worsening.  Triage Disposition: See PCP When Office is Open (Within 3 Days)  Patient/caregiver understands and will follow disposition?:     Copied from CRM #[id number]. Topic: Clinical - Red Word Triage >> Oct 19, 2024  9:24 AM Tiffini S wrote: [facility] that prompted transfer to Nurse Triage: Started a week in a half ago- very sore throat with  mucus- had trouble talking and symptoms are still present today Reason for Disposition  [1] Sore throat with cough/cold symptoms AND [2] present > 5 days  Answer Assessment - Initial Assessment Questions This RN recommended pt be examined today for symptoms, no availability in offices, advised mobile medicine clinic or UC today, pt heading to UC per preference.   Started a week and a half, was just on fire, was gargling and doing a neti rinse, seemed like was getting better, thought was better at end of week but came back again No SOB Breathing is not my normal breath, have a stuffy nose, breath through my nose and can breathe through my mouth, not having to catch breath more Mucus all stuck in back of throat, keep trying to swallow, having trouble No trouble swallowing liquids or opening mouth completely Probably like a 4/10 Lump in back of throat Feels like a lump, feels like mucus is back there Negative covid Not checked to visualize back of throat Maybe swelling is what's going on back there  Protocols used: Sore Throat-A-AH

## 2024-10-25 ENCOUNTER — Ambulatory Visit (INDEPENDENT_AMBULATORY_CARE_PROVIDER_SITE_OTHER): Admitting: Podiatry

## 2024-10-25 ENCOUNTER — Encounter: Payer: Self-pay | Admitting: Podiatry

## 2024-10-25 DIAGNOSIS — M79676 Pain in unspecified toe(s): Secondary | ICD-10-CM

## 2024-10-25 DIAGNOSIS — B351 Tinea unguium: Secondary | ICD-10-CM | POA: Diagnosis not present

## 2024-10-30 NOTE — Progress Notes (Signed)
"  °  Subjective:  Patient ID: Nancy Neal, female    DOB: Mar 21, 1932,  MRN: [id number]  Nancy Neal presents to clinic today for painful mycotic toenails of both feet that are difficult to trim. Pain interferes with daily activities and wearing enclosed shoe gear comfortably.  Chief Complaint  Patient presents with   RFC    Rm15 Routine foot care/ Dr. Almarie Cleveland last visit March 2025   New problem(s): None.   PCP is Cleveland Almarie LABOR, MD.  Allergies[1]  Review of Systems: Negative except as noted in the HPI.  Objective: No changes noted in today's physical examination. There were no vitals filed for this visit. Nancy Neal is a pleasant 89 y.o. female WD, WN in NAD. AAO x 3.  Vascular Examination: Capillary refill time immediate b/l. Palpable pedal pulses. Pedal hair present b/l. Pedal edema absent. No pain with calf compression b/l. Skin temperature gradient WNL b/l. No cyanosis or clubbing b/l. No ischemia or gangrene noted b/l.   Neurological Examination: Sensation grossly intact b/l with 10 gram monofilament. Vibratory sensation intact b/l.   Dermatological Examination: Pedal skin with normal turgor, texture and tone b/l.  No open wounds. No interdigital macerations.   Toenails 1-5 b/l thick, discolored, elongated with subungual debris and pain on dorsal palpation.   No hyperkeratotic nor porokeratotic lesions.  Musculoskeletal Examination: Muscle strength 5/5 to all lower extremity muscle groups bilaterally. No pain, crepitus or joint limitation noted with ROM bilateral LE. HAV with bunion deformity noted b/l LE.  Radiographs: None  Assessment/Plan: 1. Pain due to onychomycosis of toenail   Consent given for treatment. Patient examined. All patient's and/or POA's questions/concerns addressed on today's visit. Mycotic toenails 1-5 b/l debrided in length and girth without incident. Treatment was provided by assistant Mable FABIENE Cherry under my supervision.  Continue soft, supportive shoe gear daily. Report any pedal injuries to medical professional. Call office if there are any quesitons/concerns. -Patient/POA to call should there be question/concern in the interim.   Return in about 3 months (around 01/23/2025).  Delon LITTIE Merlin, DPM      [place] LOCATION: [address] [place] [address]                   Office [phone]   [place] LOCATION: [address] [place], [place] [address] Office [phone]     [1]  Allergies Allergen Reactions   Codeine Sulfate Nausea And Vomiting   Meclizine Other (See Comments)    Other reaction(s): Other (See Comments) glaucoma glaucoma   Acetaminophen  Nausea Only    Pain Medicines make her sick   Morphine     Other     All painkillers cause nausea   Tramadol    "

## 2024-11-03 ENCOUNTER — Ambulatory Visit

## 2024-11-15 ENCOUNTER — Ambulatory Visit

## 2024-11-15 VITALS — Ht 64.0 in | Wt 129.0 lb

## 2024-11-15 DIAGNOSIS — Z0000 Encounter for general adult medical examination without abnormal findings: Secondary | ICD-10-CM | POA: Diagnosis not present

## 2024-11-15 NOTE — Progress Notes (Signed)
 "  Chief Complaint  Patient presents with   Medicare Wellness     Subjective:   Nancy Neal is a 89 y.o. female who presents for a Medicare Annual Wellness Visit.  Visit info / Clinical Intake: Medicare Wellness Visit Type:: Subsequent Annual Wellness Visit Persons participating in visit and providing information:: patient Medicare Wellness Visit Mode:: Telephone If telephone:: video declined Since this visit was completed virtually, some vitals may be partially provided or unavailable. Missing vitals are due to the limitations of the virtual format.: Unable to obtain vitals - no equipment If Telephone or Video please confirm:: I connected with patient using audio/video enable telemedicine. I verified patient identity with two identifiers, discussed telehealth limitations, and patient agreed to proceed. Patient Location:: Home Provider Location:: Home Interpreter Needed?: No Pre-visit prep was completed: yes AWV questionnaire completed by patient prior to visit?: no Living arrangements:: (!) lives alone Patient's Overall Health Status Rating: very good Typical amount of pain: none Does pain affect daily life?: no Are you currently prescribed opioids?: no  Dietary Habits and Nutritional Risks How many meals a day?: 2 Eats fruit and vegetables daily?: yes Most meals are obtained by: preparing own meals In the last 2 weeks, have you had any of the following?: (!) nausea, vomiting, diarrhea (diarrhea) Diabetic:: no  Functional Status Activities of Daily Living (to include ambulation/medication): Independent Ambulation: Independent Medication Administration: Independent Home Management (perform basic housework or laundry): Independent Manage your own finances?: yes Primary transportation is: driving Concerns about vision?: no *vision screening is required for WTM* Concerns about hearing?: (!) yes Uses hearing aids?: (!) yes Hear whispered voice?: (!) no *in-person visit  only*  Fall Screening Falls in the past year?: 1 Number of falls in past year: 1 (3 falls-per pt) Was there an injury with Fall?: 1 Fall Risk Category Calculator: 3 Patient Fall Risk Level: High Fall Risk  Fall Risk Patient at Risk for Falls Due to: Impaired balance/gait Fall risk Follow up: Falls evaluation completed; Falls prevention discussed  Home and Transportation Safety: All rugs have non-skid backing?: N/A, no rugs All stairs or steps have railings?: (!) no (outside steps) Grab bars in the bathtub or shower?: (!) no Have non-skid surface in bathtub or shower?: (!) no Good home lighting?: yes Regular seat belt use?: yes Hospital stays in the last year:: no  Cognitive Assessment Difficulty concentrating, remembering, or making decisions? : no Will 6CIT or Mini Cog be Completed: no 6CIT or Mini Cog Declined: patient alert, oriented, able to answer questions appropriately and recall recent events  Advance Directives (For Healthcare) Does Patient Have a Medical Advance Directive?: Yes Type of Advance Directive: Healthcare Power of [place]; Living will; Out of facility DNR (pink MOST or yellow form) Copy of Healthcare Power of Attorney in Chart?: No - copy requested Copy of Living Will in Chart?: No - copy requested Out of facility DNR (pink MOST or yellow form) in Chart? (Ambulatory ONLY): No - copy requested  Reviewed/Updated  Reviewed/Updated: Reviewed All (Medical, Surgical, Family, Medications, Allergies, Care Teams, Patient Goals)    Allergies (verified) Codeine sulfate, Meclizine, Acetaminophen , Morphine , Other, and Tramadol   Current Medications (verified) Outpatient Encounter Medications as of 11/15/2024  Medication Sig   acetaminophen  (TYLENOL ) 325 MG tablet Take 2 tablets (650 mg total) by mouth every 4 (four) hours as needed for headache or mild pain.   amoxicillin  (AMOXIL ) 500 MG capsule Take 1 capsule (500 mg total) by mouth 2 (two) times daily.  Ascorbic Acid (VITAMIN C ER PO) Take 1 tablet by mouth daily.   aspirin  81 MG chewable tablet Chew 1 tablet (81 mg total) by mouth daily.   atorvastatin  (LIPITOR ) 10 MG tablet TAKE 1 TABLET BY MOUTH DAILY   Bioflavonoid Products (ESTER-C PO) Take by mouth.   carboxymethylcellulose (REFRESH PLUS) 0.5 % SOLN Place 1 drop into both eyes 3 (three) times daily as needed (dry eyes).    cetirizine (ZYRTEC) 10 MG chewable tablet Chew 10 mg by mouth daily.   Cholecalciferol (VITAMIN D3) 250 MCG (10000 UT) TABS Take 1 capsule by mouth daily.   Collagenase POWD by Does not apply route.   fluticasone  (FLONASE ) 50 MCG/ACT nasal spray Place into both nostrils as needed for allergies.   MAGNESIUM PO Take 1 tablet by mouth daily.   Multiple Vitamin (MULTIVITAMIN WITH MINERALS) TABS tablet Take 1 tablet by mouth daily.   nitroGLYCERIN  (NITROSTAT ) 0.4 MG SL tablet Place 0.4 mg under the tongue as needed for chest pain.   Probiotic Product (CULTURELLE PROBIOTICS PO) Take by mouth.   vitamin E 1000 UNIT capsule Take 1,000 Units by mouth daily.   vitamin B-12 (CYANOCOBALAMIN ) 100 MCG tablet Take 100 mcg by mouth daily.   No facility-administered encounter medications on file as of 11/15/2024.    History: Past Medical History:  Diagnosis Date   ALLERGIC RHINITIS 06/02/2007   Arthritis    maybe in my right knee (12/19/2015)   Coronary artery disease 11/2015   a. 11/2015: Inferior STEMI w/ Promus DES to RCA.     Diverticulosis of colon 2003   by 2003 and 2008 colonoscopy.    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   History of blood transfusion 1971   when I had hysterectomy   Myocardial infarction (HCC) 11/2015   OSTEOPENIA 06/02/2007   VAGINITIS, ATROPHIC, POSTMENOPAUSAL 12/06/2008   VERTIGO 02/16/2009   Past Surgical History:  Procedure Laterality Date   APPENDECTOMY  1971   CARDIAC CATHETERIZATION N/A 12/15/2015   Procedure: Left Heart Cath and Coronary Angiography;   Surgeon: Victory LELON Sharps, MD;  Location: [facility] INVASIVE CV LAB;  Service: Cardiovascular;  Laterality: N/A;   CARDIAC CATHETERIZATION N/A 12/15/2015   Procedure: Coronary Stent Intervention;  Surgeon: Victory LELON Sharps, MD;  Location: [facility] INVASIVE CV LAB;  Service: Cardiovascular;  Laterality: N/A;   COLONOSCOPY N/A 12/20/2015   Procedure: COLONOSCOPY;  Surgeon: Norleen LOISE Kiang, MD;  Location: [facility] ENDOSCOPY;  Service: Endoscopy;  Laterality: N/A;   EYE SURGERY Bilateral    laser tx for glaucoma   HEMORRHOID SURGERY  ~ 1968   SHOULDER ARTHROSCOPY W/ ROTATOR CUFF REPAIR Right 11/2014   TOTAL ABDOMINAL HYSTERECTOMY  1971   Family History  Problem Relation Age of Onset   Suicidality Mother    Heart disease Father    Arthritis Brother    Breast cancer Neg Hx    Colon cancer Neg Hx    Stomach cancer Neg Hx    Esophageal cancer Neg Hx    Social History   Occupational History   Occupation: RETIRED  Tobacco Use   Smoking status: Never    Passive exposure: Past   Smokeless tobacco: Never  Vaping Use   Vaping status: Never Used  Substance and Sexual Activity   Alcohol  use: Not Currently    Comment: sporadically   Drug use: No   Sexual activity: Not on file   Tobacco Counseling Counseling given: Not Answered  SDOH Screenings  Food Insecurity: No Food Insecurity (11/15/2024)  Housing: Unknown (11/15/2024)  Transportation Needs: No Transportation Needs (11/15/2024)  Utilities: Not At Risk (11/15/2024)  Alcohol  Screen: Low Risk (11/02/2023)  Depression (PHQ2-9): Low Risk (11/15/2024)  Financial Resource Strain: Low Risk (11/02/2023)  Physical Activity: Sufficiently Active (11/15/2024)  Social Connections: Socially Isolated (11/15/2024)  Stress: No Stress Concern Present (11/15/2024)  Tobacco Use: Low Risk (11/15/2024)  Health Literacy: Adequate Health Literacy (11/15/2024)   See flowsheets for full screening details  Depression Screen PHQ 2 & 9 Depression Scale- Over the past 2 weeks, how often have  you been bothered by any of the following problems? Little interest or pleasure in doing things: 0 Feeling down, depressed, or hopeless (PHQ Adolescent also includes...irritable): 1 (due to the loss of her son) PHQ-2 Total Score: 1 Trouble falling or staying asleep, or sleeping too much: 1 Feeling tired or having little energy: 0 Poor appetite or overeating (PHQ Adolescent also includes...weight loss): 0 Feeling bad about yourself - or that you are a failure or have let yourself or your family down: 0 Trouble concentrating on things, such as reading the newspaper or watching television (PHQ Adolescent also includes...like school work): 0 Moving or speaking so slowly that other people could have noticed. Or the opposite - being so fidgety or restless that you have been moving around a lot more than usual: 0 Thoughts that you would be better off dead, or of hurting yourself in some way: 0 PHQ-9 Total Score: 2 If you checked off any problems, how difficult have these problems made it for you to do your work, take care of things at home, or get along with other people?: Not difficult at all  Depression Treatment Depression Interventions/Treatment : EYV7-0 Score <4 Follow-up Not Indicated     Goals Addressed               This Visit's Progress     Maintain current health  (pt-stated)   On track            Objective:    Today's Vitals   11/15/24 1130  Weight: 129 lb (58.5 kg)  Height: 5' 4 (1.626 m)   Body mass index is 22.14 kg/m.  Hearing/Vision screen Hearing Screening - Comments:: Wears hearing aides Vision Screening - Comments:: Denies vision issues./UTD/[facility]  Immunizations and Health Maintenance Health Maintenance  Topic Date Due   COVID-19 Vaccine (5 - 2025-26 season) 06/20/2024   Medicare Annual Wellness (AWV)  11/15/2025   DTaP/Tdap/Td (2 - Td or Tdap) 10/01/2028   Pneumococcal Vaccine: 50+ Years  Completed   Influenza Vaccine  Completed   Bone  Density Scan  Completed   Zoster Vaccines- Shingrix  Completed   Meningococcal B Vaccine  Aged Out   Colonoscopy  Discontinued        Assessment/Plan:  This is a routine wellness examination for Bekki.  Patient Care Team: Rollene Almarie LABOR, MD as PCP - General (Internal Medicine) Jeffrie Oneil BROCKS, MD as PCP - Cardiology (Cardiology) Leila Bound, OD (Optometry) Shamleffer, Donell Cardinal, MD as Attending Physician (Endocrinology) Federico Rosario BROCKS, MD as Consulting Physician (Gastroenterology) Junior Emmit Ivanoff, MD as Referring Physician (Dermatology) Melita Drivers, MD as Consulting Physician (Orthopedic Surgery)  I have personally reviewed and noted the following in the patients chart:   Medical and social history Use of alcohol , tobacco or illicit drugs  Current medications and supplements including opioid prescriptions. Functional ability and status Nutritional status Physical activity Advanced directives List of other  physicians Hospitalizations, surgeries, and ER visits in previous 12 months Vitals Screenings to include cognitive, depression, and falls Referrals and appointments  No orders of the defined types were placed in this encounter.  In addition, I have reviewed and discussed with patient certain preventive protocols, quality metrics, and best practice recommendations. A written personalized care plan for preventive services as well as general preventive health recommendations were provided to patient.   Ronny Korff L Novi Calia, CMA   11/15/2024   Return in 1 year (on 11/15/2025).  After Visit Summary: (MyChart) Due to this being a telephonic visit, the after visit summary with patients personalized plan was offered to patient via MyChart   Nurse Notes: No voiced or noted concerns at this time. "

## 2024-11-15 NOTE — Patient Instructions (Addendum)
 Ms. Gagan,  Thank you for taking the time for your Medicare Wellness Visit. I appreciate your continued commitment to your health goals. Please review the care plan we discussed, and feel free to reach out if I can assist you further.  Please note that Annual Wellness Visits do not include a physical exam. Some assessments may be limited, especially if the visit was conducted virtually. If needed, we may recommend an in-person follow-up with your provider.  Ongoing Care Seeing your primary care provider every 3 to 6 months helps us  monitor your health and provide consistent, personalized care. Last office visit was on 01/14/2024.  Keep up the good work.  Referrals If a referral was made during today's visit and you haven't received any updates within two weeks, please contact the referred provider directly to check on the status.  Recommended Screenings:  Health Maintenance  Topic Date Due   Flu Shot  05/20/2024   COVID-19 Vaccine (5 - 2025-26 season) 06/20/2024   Medicare Annual Wellness Visit  11/01/2024   DTaP/Tdap/Td vaccine (2 - Td or Tdap) 10/01/2028   Pneumococcal Vaccine for age over 77  Completed   Osteoporosis screening with Bone Density Scan  Completed   Zoster (Shingles) Vaccine  Completed   Meningitis B Vaccine  Aged Out   Colon Cancer Screening  Discontinued       11/15/2024   11:36 AM  Advanced Directives  Does Patient Have a Medical Advance Directive? Yes  Type of Estate Agent of [place];Living will;Out of facility DNR (pink MOST or yellow form)  Copy of Healthcare Power of Attorney in Chart? No - copy requested    Vision: Annual vision screenings are recommended for early detection of glaucoma, cataracts, and diabetic retinopathy. These exams can also reveal signs of chronic conditions such as diabetes and high blood pressure.  Dental: Annual dental screenings help detect early signs of oral cancer, gum disease, and other conditions linked  to overall health, including heart disease and diabetes.  Please see the attached documents for additional preventive care recommendations.

## 2025-01-25 ENCOUNTER — Ambulatory Visit: Admitting: Podiatry

## 2025-02-27 ENCOUNTER — Ambulatory Visit: Admitting: Cardiology

## 2025-04-26 ENCOUNTER — Ambulatory Visit: Admitting: Podiatry
